# Patient Record
Sex: Female | Born: 1950 | Race: White | Hispanic: No | Marital: Single | Smoking: Current every day smoker
Health system: Southern US, Community
[De-identification: ages and names within clinical notes are randomized; demographics above are authoritative.]

## PROBLEM LIST (undated history)

## (undated) DIAGNOSIS — F17200 Nicotine dependence, unspecified, uncomplicated: Secondary | ICD-10-CM

## (undated) DIAGNOSIS — I4811 Longstanding persistent atrial fibrillation: Secondary | ICD-10-CM

## (undated) DIAGNOSIS — Z87898 Personal history of other specified conditions: Secondary | ICD-10-CM

## (undated) DIAGNOSIS — K219 Gastro-esophageal reflux disease without esophagitis: Secondary | ICD-10-CM

## (undated) DIAGNOSIS — R809 Proteinuria, unspecified: Secondary | ICD-10-CM

## (undated) DIAGNOSIS — E119 Type 2 diabetes mellitus without complications: Secondary | ICD-10-CM

## (undated) DIAGNOSIS — E785 Hyperlipidemia, unspecified: Secondary | ICD-10-CM

## (undated) DIAGNOSIS — K922 Gastrointestinal hemorrhage, unspecified: Secondary | ICD-10-CM

## (undated) DIAGNOSIS — E6609 Other obesity due to excess calories: Secondary | ICD-10-CM

## (undated) DIAGNOSIS — J449 Chronic obstructive pulmonary disease, unspecified: Secondary | ICD-10-CM

## (undated) DIAGNOSIS — I10 Essential (primary) hypertension: Secondary | ICD-10-CM

## (undated) DIAGNOSIS — D509 Iron deficiency anemia, unspecified: Secondary | ICD-10-CM

## (undated) DIAGNOSIS — D649 Anemia, unspecified: Secondary | ICD-10-CM

## (undated) HISTORY — DX: Iron deficiency anemia, unspecified: D50.9

## (undated) HISTORY — DX: Hyperlipidemia, unspecified: E78.5

## (undated) HISTORY — PX: OTHER SURGICAL HISTORY: SHX169

## (undated) HISTORY — DX: Proteinuria, unspecified: R80.9

## (undated) HISTORY — DX: Anemia, unspecified: D64.9

## (undated) HISTORY — DX: Gastro-esophageal reflux disease without esophagitis: K21.9

## (undated) HISTORY — DX: Nicotine dependence, unspecified, uncomplicated: F17.200

## (undated) HISTORY — DX: Other obesity due to excess calories: E66.09

## (undated) HISTORY — DX: Essential (primary) hypertension: I10

## (undated) HISTORY — DX: Personal history of other specified conditions: Z87.898

---
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
## 1998-06-14 ENCOUNTER — Ambulatory Visit (HOSPITAL_COMMUNITY): Admission: RE | Admit: 1998-06-14 | Discharge: 1998-06-14 | Payer: Self-pay | Admitting: Otolaryngology

## 1998-06-14 ENCOUNTER — Encounter: Payer: Self-pay | Admitting: Otolaryngology

## 1999-02-23 ENCOUNTER — Encounter: Payer: Self-pay | Admitting: Family Medicine

## 1999-02-23 ENCOUNTER — Encounter: Admission: RE | Admit: 1999-02-23 | Discharge: 1999-02-23 | Payer: Self-pay | Admitting: Family Medicine

## 1999-06-04 ENCOUNTER — Other Ambulatory Visit: Admission: RE | Admit: 1999-06-04 | Discharge: 1999-06-04 | Payer: Self-pay | Admitting: Family Medicine

## 2000-07-30 ENCOUNTER — Encounter: Payer: Self-pay | Admitting: Family Medicine

## 2000-07-30 ENCOUNTER — Encounter: Admission: RE | Admit: 2000-07-30 | Discharge: 2000-07-30 | Payer: Self-pay | Admitting: Family Medicine

## 2000-08-15 ENCOUNTER — Encounter: Payer: Self-pay | Admitting: Family Medicine

## 2000-08-15 ENCOUNTER — Encounter: Admission: RE | Admit: 2000-08-15 | Discharge: 2000-08-15 | Payer: Self-pay | Admitting: Family Medicine

## 2001-07-20 ENCOUNTER — Other Ambulatory Visit: Admission: RE | Admit: 2001-07-20 | Discharge: 2001-07-20 | Payer: Self-pay | Admitting: Family Medicine

## 2003-07-25 ENCOUNTER — Encounter: Admission: RE | Admit: 2003-07-25 | Discharge: 2003-07-25 | Payer: Self-pay | Admitting: Family Medicine

## 2003-08-10 ENCOUNTER — Other Ambulatory Visit: Admission: RE | Admit: 2003-08-10 | Discharge: 2003-08-10 | Payer: Self-pay | Admitting: Family Medicine

## 2004-09-20 ENCOUNTER — Encounter: Admission: RE | Admit: 2004-09-20 | Discharge: 2004-09-20 | Payer: Self-pay | Admitting: Family Medicine

## 2004-10-12 ENCOUNTER — Other Ambulatory Visit: Admission: RE | Admit: 2004-10-12 | Discharge: 2004-10-12 | Payer: Self-pay | Admitting: Family Medicine

## 2005-10-14 ENCOUNTER — Other Ambulatory Visit: Admission: RE | Admit: 2005-10-14 | Discharge: 2005-10-14 | Payer: Self-pay | Admitting: Family Medicine

## 2005-10-22 ENCOUNTER — Encounter: Admission: RE | Admit: 2005-10-22 | Discharge: 2005-10-22 | Payer: Self-pay | Admitting: Family Medicine

## 2006-11-11 ENCOUNTER — Other Ambulatory Visit: Admission: RE | Admit: 2006-11-11 | Discharge: 2006-11-11 | Payer: Self-pay | Admitting: Family Medicine

## 2006-11-25 ENCOUNTER — Encounter: Admission: RE | Admit: 2006-11-25 | Discharge: 2006-11-25 | Payer: Self-pay | Admitting: Family Medicine

## 2007-12-09 ENCOUNTER — Encounter: Admission: RE | Admit: 2007-12-09 | Discharge: 2007-12-09 | Payer: Self-pay | Admitting: Family Medicine

## 2007-12-17 ENCOUNTER — Other Ambulatory Visit: Admission: RE | Admit: 2007-12-17 | Discharge: 2007-12-17 | Payer: Self-pay | Admitting: Family Medicine

## 2008-12-12 ENCOUNTER — Encounter: Admission: RE | Admit: 2008-12-12 | Discharge: 2008-12-12 | Payer: Self-pay | Admitting: Family Medicine

## 2008-12-20 ENCOUNTER — Other Ambulatory Visit: Admission: RE | Admit: 2008-12-20 | Discharge: 2008-12-20 | Payer: Self-pay | Admitting: Family Medicine

## 2009-12-18 ENCOUNTER — Encounter: Admission: RE | Admit: 2009-12-18 | Discharge: 2009-12-18 | Payer: Self-pay | Admitting: Family Medicine

## 2010-12-24 ENCOUNTER — Other Ambulatory Visit: Payer: Self-pay | Admitting: Family Medicine

## 2010-12-24 DIAGNOSIS — Z1231 Encounter for screening mammogram for malignant neoplasm of breast: Secondary | ICD-10-CM

## 2010-12-25 ENCOUNTER — Other Ambulatory Visit: Payer: Self-pay | Admitting: Family Medicine

## 2010-12-25 ENCOUNTER — Other Ambulatory Visit (HOSPITAL_COMMUNITY)
Admission: RE | Admit: 2010-12-25 | Discharge: 2010-12-25 | Disposition: A | Discharge status: Home / Self Care | Payer: 59 | Source: Ambulatory Visit | Attending: Family Medicine | Admitting: Family Medicine

## 2010-12-25 DIAGNOSIS — Z124 Encounter for screening for malignant neoplasm of cervix: Secondary | ICD-10-CM | POA: Insufficient documentation

## 2010-12-25 DIAGNOSIS — Z1159 Encounter for screening for other viral diseases: Secondary | ICD-10-CM | POA: Insufficient documentation

## 2010-12-26 ENCOUNTER — Ambulatory Visit
Admission: RE | Admit: 2010-12-26 | Discharge: 2010-12-26 | Disposition: A | Discharge status: Home / Self Care | Payer: 59 | Source: Ambulatory Visit | Attending: Family Medicine | Admitting: Family Medicine

## 2010-12-26 DIAGNOSIS — Z1231 Encounter for screening mammogram for malignant neoplasm of breast: Secondary | ICD-10-CM

## 2011-12-25 ENCOUNTER — Other Ambulatory Visit: Payer: Self-pay | Admitting: Family Medicine

## 2011-12-25 DIAGNOSIS — Z1231 Encounter for screening mammogram for malignant neoplasm of breast: Secondary | ICD-10-CM

## 2012-01-13 ENCOUNTER — Ambulatory Visit: Payer: 59

## 2012-01-27 ENCOUNTER — Ambulatory Visit
Admission: RE | Admit: 2012-01-27 | Discharge: 2012-01-27 | Disposition: A | Discharge status: Home / Self Care | Payer: Commercial Indemnity | Source: Ambulatory Visit | Attending: Family Medicine | Admitting: Family Medicine

## 2012-01-27 ENCOUNTER — Ambulatory Visit: Payer: 59

## 2012-01-27 DIAGNOSIS — Z1231 Encounter for screening mammogram for malignant neoplasm of breast: Secondary | ICD-10-CM

## 2013-01-13 ENCOUNTER — Other Ambulatory Visit: Payer: Self-pay | Admitting: Family Medicine

## 2013-01-13 DIAGNOSIS — F17200 Nicotine dependence, unspecified, uncomplicated: Secondary | ICD-10-CM

## 2013-01-27 ENCOUNTER — Other Ambulatory Visit: Payer: Self-pay

## 2013-01-27 DIAGNOSIS — Z1231 Encounter for screening mammogram for malignant neoplasm of breast: Secondary | ICD-10-CM

## 2013-02-15 ENCOUNTER — Ambulatory Visit
Admission: RE | Admit: 2013-02-15 | Discharge: 2013-02-15 | Disposition: A | Discharge status: Home / Self Care | Payer: Commercial Indemnity | Source: Ambulatory Visit

## 2013-02-15 DIAGNOSIS — Z1231 Encounter for screening mammogram for malignant neoplasm of breast: Secondary | ICD-10-CM

## 2014-03-07 ENCOUNTER — Other Ambulatory Visit: Payer: Self-pay

## 2014-03-07 DIAGNOSIS — Z1231 Encounter for screening mammogram for malignant neoplasm of breast: Secondary | ICD-10-CM

## 2014-03-14 ENCOUNTER — Other Ambulatory Visit: Payer: Self-pay | Admitting: Gastroenterology

## 2014-03-17 ENCOUNTER — Ambulatory Visit
Admission: RE | Admit: 2014-03-17 | Discharge: 2014-03-17 | Disposition: A | Discharge status: Home / Self Care | Payer: Commercial Indemnity | Source: Ambulatory Visit

## 2014-03-17 DIAGNOSIS — Z1231 Encounter for screening mammogram for malignant neoplasm of breast: Secondary | ICD-10-CM

## 2015-03-16 ENCOUNTER — Other Ambulatory Visit: Payer: Self-pay

## 2015-03-16 DIAGNOSIS — Z1231 Encounter for screening mammogram for malignant neoplasm of breast: Secondary | ICD-10-CM

## 2015-04-06 ENCOUNTER — Ambulatory Visit: Payer: Commercial Indemnity

## 2015-04-27 ENCOUNTER — Ambulatory Visit: Payer: Commercial Indemnity

## 2015-05-29 ENCOUNTER — Ambulatory Visit
Admission: RE | Admit: 2015-05-29 | Discharge: 2015-05-29 | Disposition: A | Discharge status: Home / Self Care | Payer: 59 | Source: Ambulatory Visit

## 2015-05-29 DIAGNOSIS — Z1231 Encounter for screening mammogram for malignant neoplasm of breast: Secondary | ICD-10-CM

## 2015-05-31 HISTORY — PX: DIAGNOSTIC MAMMOGRAM: HXRAD719

## 2015-10-13 ENCOUNTER — Encounter: Payer: Self-pay | Admitting: Internal Medicine

## 2015-11-29 ENCOUNTER — Encounter: Payer: Self-pay | Admitting: Internal Medicine

## 2016-02-02 ENCOUNTER — Other Ambulatory Visit (HOSPITAL_COMMUNITY)
Admission: RE | Admit: 2016-02-02 | Discharge: 2016-02-02 | Disposition: A | Discharge status: Home / Self Care | Payer: 59 | Source: Ambulatory Visit | Attending: Family Medicine | Admitting: Family Medicine

## 2016-02-02 ENCOUNTER — Other Ambulatory Visit: Payer: Self-pay | Admitting: Family Medicine

## 2016-02-02 DIAGNOSIS — Z124 Encounter for screening for malignant neoplasm of cervix: Secondary | ICD-10-CM | POA: Diagnosis present

## 2016-02-02 DIAGNOSIS — Z1151 Encounter for screening for human papillomavirus (HPV): Secondary | ICD-10-CM | POA: Insufficient documentation

## 2016-02-05 LAB — CYTOLOGY - PAP

## 2016-03-11 ENCOUNTER — Ambulatory Visit (INDEPENDENT_AMBULATORY_CARE_PROVIDER_SITE_OTHER): Payer: 59 | Admitting: Cardiovascular Disease

## 2016-03-11 ENCOUNTER — Encounter: Payer: Self-pay | Admitting: Cardiovascular Disease

## 2016-03-11 ENCOUNTER — Encounter (INDEPENDENT_AMBULATORY_CARE_PROVIDER_SITE_OTHER): Payer: Self-pay

## 2016-03-11 VITALS — BP 188/64 | HR 66 | Ht 65.0 in | Wt 233.0 lb

## 2016-03-11 DIAGNOSIS — I10 Essential (primary) hypertension: Secondary | ICD-10-CM

## 2016-03-11 DIAGNOSIS — I517 Cardiomegaly: Secondary | ICD-10-CM | POA: Diagnosis not present

## 2016-03-11 DIAGNOSIS — I739 Peripheral vascular disease, unspecified: Secondary | ICD-10-CM | POA: Diagnosis not present

## 2016-03-11 MED ORDER — AMLODIPINE BESYLATE 5 MG PO TABS: 5.0000 mg | ORAL_TABLET | Freq: Every day | ORAL | 3 refills | Status: DC

## 2016-03-11 MED ORDER — AMLODIPINE BESYLATE 5 MG PO TABS
5.0000 mg | ORAL_TABLET | Freq: Every day | ORAL | 3 refills | Status: DC
Start: 2016-03-11 — End: 2016-03-11

## 2016-03-11 NOTE — Patient Instructions (Addendum)
Your physician has recommended you make the following change in your medication:  1.) START amlodipine 5 mg once daily for blood pressure  Your physician has requested that you have an echocardiogram. Echocardiography is a painless test that uses sound waves to create images of your heart. It provides your doctor with information about the size and shape of your heart and how well your heart's chambers and valves are working. This procedure takes approximately one hour. There are no restrictions for this procedure.  Your physician has requested that you have a lower extremity arterial duplex. This test is an ultrasound of the arteries in the legs. It looks at arterial blood flow in the legs. Allow one hour for Lower Arterial scans. There are no restrictions or special instructions.  Your physician has requested that you have an echocardiogram. Echocardiography is a painless test that uses sound waves to create images of your heart. It provides your doctor with information about the size and shape of your heart and how well your heart's chambers and valves are working. This procedure takes approximately one hour. There are no restrictions for this procedure.  Your physician recommends that you schedule a follow-up appointment in: 3-4 months with Dr. Acie Fredrickson.

## 2016-03-11 NOTE — Progress Notes (Signed)
Cardiology Office Note   Date:  03/11/2016   ID:  Paula Wheeler, DOB Jan 14, 1951, MRN [id number]  PCP:  Gennette Pac, MD  Cardiologist:   Mertie Moores, MD   Chief Complaint  Patient presents with  . Hypertension   Problem list 1. Essential HTN  2. Hyperlipidemia 3. Cardiomegaly on x-ray 4. Diabetes mellitus 5. Obesity  6.      Nov. 13, 2017: Paula Wheeler is a 65 y.o. female who presents for evaluation of cardiomegaly She requested a CXR to look for lung issues ( is a current smoker) was found have cardiac megaly.  Her main complaint is that her legs give out when she is walking. She does not get any regular exercise.  No CP.   Some shortness of breath with exertion  Has DM,   She is getting better control of her glucose levels recently .    She admits that she eats more salt that she probably should. She's canned soup was on occasions. She put salt on her apples.   Past Medical History:  Diagnosis Date  . Abnormal urine findings   . Abnormal vaginal Pap smear   . Allergic rhinitis   . Anemia   . Cardiomegaly   . Diabetes mellitus without complication ([place])   . Dyslipidemia   . Dyspnea   . Encounter for screening for malignant neoplasm of cervix   . Exogenous obesity   . GERD (gastroesophageal reflux disease)   . H/O: gout   . History of abnormal cells from cervix   . Hyperlipidemia   . Hypertension   . Iron deficiency anemia   . Microalbuminuria   . Peripheral edema   . Tobacco dependence   . Tobacco use   . Wax in ear     Past Surgical History:  Procedure Laterality Date  . APPENDECTOMY     AGE 10  . DIAGNOSTIC MAMMOGRAM  05/2015  . EXPLORATORY LAPAROTOMY    . PAP SMEAR  2012 AND 2017     Current Outpatient Prescriptions  Medication Sig Dispense Refill  . albuterol (PROVENTIL HFA;VENTOLIN HFA) 108 (90 Base) MCG/ACT inhaler Inhale 2 puffs into the lungs every 4 (four) hours as needed for wheezing or shortness of breath.      . allopurinol (ZYLOPRIM) 100 MG tablet Take 100 mg by mouth daily.    . Aspirin-Acetaminophen-Caffeine (GOODY HEADACHE PO) Take 1 Package by mouth daily as needed (for pain).    Marland Kitchen atorvastatin (LIPITOR) 80 MG tablet Take 80 mg by mouth daily.    Marland Kitchen esomeprazole (NEXIUM) 20 MG packet Take 20 mg by mouth daily before breakfast.    . HYDROcodone-acetaminophen (NORCO/VICODIN) 5-325 MG tablet Take 1 tablet by mouth every 6 (six) hours as needed for moderate pain.    . indomethacin (INDOCIN) 25 MG capsule 1-2 CAPSULES EVERY 6-8 HOURS WITH FOOD PRN FOR PAIN    . insulin NPH-regular Human (NOVOLIN 70/30) (70-30) 100 UNIT/ML injection Inject into the skin. INJECT 35 UNITS IN MORNINGS AND 60 UNITS IN EVENINGS    . Insulin Syringe-Needle U-100 (B-D INS SYR ULTRAFINE 1CC/31G) 31G X 5/16" 1 ML MISC by Does not apply route.    . loratadine (CLARITIN) 10 MG tablet Take 10 mg by mouth daily.    Marland Kitchen METFORMIN HCL PO 500 mg. TAKE 2 TABLETS BY MOUTH EVERY MORNING AND 3 TABLETS BY MOUTH EVERY EVENING    . QUINAPRIL HCL PO Take 40 mg by mouth 2 (two) times daily.    Marland Kitchen  TRIAMCINOLONE ACETONIDE EX Apply topically.    . triamterene-hydrochlorothiazide (MAXZIDE) 75-50 MG tablet Take 1 tablet by mouth daily.     No current facility-administered medications for this visit.     Allergies:   Nsaids and Penicillins    Social History:  The patient  reports that she has been smoking Cigarettes.  She has been smoking about 0.50 packs per day. She has never used smokeless tobacco. She reports that she drinks alcohol. She reports that she does not use drugs.   Family History:  The patient's family history includes Cancer in her mother; Colon polyps in her brother, brother, brother, brother, brother, and brother; Diabetes in her father; Heart attack in her father; Hypertension in her mother; Macular degeneration in her mother.    ROS:  Please see the history of present illness.    Review of Systems: Constitutional:  denies  fever, chills, diaphoresis, appetite change and fatigue.  HEENT: denies photophobia, eye pain, redness, hearing loss, ear pain, congestion, sore throat, rhinorrhea, sneezing, neck pain, neck stiffness and tinnitus.  Respiratory: denies SOB, DOE, cough, chest tightness, and wheezing.  Cardiovascular: denies chest pain, palpitations and leg swelling.  Gastrointestinal: denies nausea, vomiting, abdominal pain, diarrhea, constipation, blood in stool.  Genitourinary: denies dysuria, urgency, frequency, hematuria, flank pain and difficulty urinating.  Musculoskeletal: admits to   Leg pain and burning with walking   Skin: admits to  Red scaley rash on lower legs  Neurological: denies dizziness, seizures, syncope, weakness, light-headedness, numbness and headaches.   Hematological: denies adenopathy, easy bruising, personal or family bleeding history.  Psychiatric/ Behavioral: denies suicidal ideation, mood changes, confusion, nervousness, sleep disturbance and agitation.       All other systems are reviewed and negative.    PHYSICAL EXAM: VS:  BP (!) 188/64 (BP Location: Right Arm)   Pulse 66   Ht 5\' 5"  (1.651 m)   Wt 233 lb (105.7 kg)   SpO2 98%   BMI 38.77 kg/m  , BMI Body mass index is 38.77 kg/m. GEN:  Obese  HEENT: normal  Neck: no JVD, carotid bruits, or masses Cardiac: RRR; no murmurs, rubs, or gallops,no edema  Respiratory:  clear to auscultation bilaterally, normal work of breathing GI: soft, nontender, nondistended, + BS MS: trace - 1 + leg edema,  scaley rash on lower ext.  Poor distal leg pulses  Skin: warm and dry, no rash Neuro:  Strength and sensation are intact Psych: normal  EKG:  EKG is ordered today. The ekg ordered today demonstrates  Sinus brady at 53. NS sT abn.    Recent Labs: No results found for requested labs within last 8760 hours.    Lipid Panel No results found for: CHOL, TRIG, HDL, CHOLHDL, VLDL, LDLCALC, LDLDIRECT    Wt Readings from Last 3  Encounters:  03/11/16 233 lb (105.7 kg)      Other studies Reviewed: Additional studies/ records that were reviewed today include: . Review of the above records demonstrates:    ASSESSMENT AND PLAN:  1.  Cardiomegaly: We'll get an echocardiogram for further evaluation.  We've discussed restricting salt intake and weight loss.    2. Essential hypertension: We discussed weight loss and salt restricted. She also needs to quit smoking. We'll start amlodipine 5 mg a day. Continue meds.   3. Claudication:   She complains of having leg pain and an burning when she walks. I suspect that she has peripheral vascular disease from her smoking. We'll get a lower extremity arterial  duplex scan. Ive advised Smoking cessation and regular exercise. I've advised weight loss.  Will see her in 3-4 months for follow up visit  Current medicines are reviewed at length with the patient today.  The patient does not have concerns regarding medicines.  Labs/ tests ordered today include:  No orders of the defined types were placed in this encounter.   Disposition:   FU with me in 3-4 months     Mertie Moores, MD  03/11/2016 9:24 AM    [address], [place], [place]  [address] Phone: [phone]; Fax: [phone]

## 2016-03-13 ENCOUNTER — Encounter: Payer: Self-pay | Admitting: Cardiovascular Disease

## 2016-03-14 ENCOUNTER — Encounter: Payer: Self-pay | Admitting: Cardiovascular Disease

## 2016-03-25 ENCOUNTER — Telehealth: Payer: Self-pay | Admitting: Cardiovascular Disease

## 2016-03-25 NOTE — Telephone Encounter (Signed)
Patient wants to know if Dr. Acie Fredrickson would prescribe pain medication for her leg pain.  She has an LE ARTERIAL scheduled for 04/03/16

## 2016-03-25 NOTE — Telephone Encounter (Signed)
Im sorry to hear that she is having leg pain . I will not be able to give her pain meds She may need to call her medical doctor

## 2016-03-25 NOTE — Telephone Encounter (Signed)
Follow Up:    Pt calling again,she wants something for the pain. I told her we were waiting to hear from the doctor.

## 2016-03-25 NOTE — Telephone Encounter (Signed)
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  I reviewed her medications and asked if she takes lipitor 80 mg daily.  She states she has been cutting the medication in half and taking 40 mg on the advice of her pharmacist.  I advised that lipitor can cause leg pain and asked if she has correlated the 2 in the past; she denies. I advised per Dr. Acie Fredrickson, that she may hold lipitor for 2-4 weeks to see if pain improves. I advised that per Dr. Acie Fredrickson, he does not feel that her her leg pain is caused by the claudication but the lower extremity u/s will tell us more.  I advised she may take ibuprofen 400 mg tid as needed for pain. She verbalized understanding and agreement with plan and is aware of upcoming appointments for testing.  She thanked me for the call.

## 2016-03-26 ENCOUNTER — Other Ambulatory Visit: Payer: Self-pay | Admitting: Cardiovascular Disease

## 2016-03-26 DIAGNOSIS — I739 Peripheral vascular disease, unspecified: Secondary | ICD-10-CM

## 2016-04-01 ENCOUNTER — Ambulatory Visit (HOSPITAL_COMMUNITY): Payer: 59 | Attending: Cardiology

## 2016-04-01 ENCOUNTER — Other Ambulatory Visit: Payer: Self-pay

## 2016-04-01 DIAGNOSIS — E785 Hyperlipidemia, unspecified: Secondary | ICD-10-CM | POA: Insufficient documentation

## 2016-04-01 DIAGNOSIS — I340 Nonrheumatic mitral (valve) insufficiency: Secondary | ICD-10-CM | POA: Insufficient documentation

## 2016-04-01 DIAGNOSIS — E119 Type 2 diabetes mellitus without complications: Secondary | ICD-10-CM | POA: Diagnosis not present

## 2016-04-01 DIAGNOSIS — Z8249 Family history of ischemic heart disease and other diseases of the circulatory system: Secondary | ICD-10-CM | POA: Diagnosis not present

## 2016-04-01 DIAGNOSIS — I739 Peripheral vascular disease, unspecified: Secondary | ICD-10-CM | POA: Diagnosis not present

## 2016-04-01 DIAGNOSIS — I517 Cardiomegaly: Secondary | ICD-10-CM | POA: Diagnosis not present

## 2016-04-01 DIAGNOSIS — I10 Essential (primary) hypertension: Secondary | ICD-10-CM | POA: Diagnosis not present

## 2016-04-01 DIAGNOSIS — I119 Hypertensive heart disease without heart failure: Secondary | ICD-10-CM | POA: Diagnosis not present

## 2016-04-01 DIAGNOSIS — Z720 Tobacco use: Secondary | ICD-10-CM | POA: Insufficient documentation

## 2016-04-03 ENCOUNTER — Ambulatory Visit (HOSPITAL_COMMUNITY)
Admission: RE | Admit: 2016-04-03 | Discharge: 2016-04-03 | Disposition: A | Discharge status: Home / Self Care | Payer: 59 | Source: Ambulatory Visit | Attending: Cardiovascular Disease | Admitting: Cardiovascular Disease

## 2016-04-03 DIAGNOSIS — F17200 Nicotine dependence, unspecified, uncomplicated: Secondary | ICD-10-CM | POA: Insufficient documentation

## 2016-04-03 DIAGNOSIS — E785 Hyperlipidemia, unspecified: Secondary | ICD-10-CM | POA: Insufficient documentation

## 2016-04-03 DIAGNOSIS — E119 Type 2 diabetes mellitus without complications: Secondary | ICD-10-CM | POA: Insufficient documentation

## 2016-04-03 DIAGNOSIS — I10 Essential (primary) hypertension: Secondary | ICD-10-CM | POA: Insufficient documentation

## 2016-04-03 DIAGNOSIS — I739 Peripheral vascular disease, unspecified: Secondary | ICD-10-CM | POA: Diagnosis not present

## 2016-04-05 ENCOUNTER — Telehealth: Payer: Self-pay | Admitting: Cardiovascular Disease

## 2016-04-05 NOTE — Telephone Encounter (Signed)
Pt had doppler done Wednesday, and would like the results pls call  [phone] or [phone]

## 2016-04-05 NOTE — Telephone Encounter (Signed)
Patient is calling for results from her lower extremity ultrasounds done on 04/03/16. She states that she is still having pain in her lower extremities and it is difficult to walk. I let the patient know that we do not have those results on file and as soon as we do we will give her a call back. Patient verbalized understanding.

## 2016-04-05 NOTE — Telephone Encounter (Signed)
She has significant PVD in both legs Please set her up to see Dr. Fletcher Anon for [facility] consult

## 2016-04-09 ENCOUNTER — Encounter: Payer: Self-pay | Admitting: Cardiovascular Disease

## 2016-04-09 ENCOUNTER — Ambulatory Visit (INDEPENDENT_AMBULATORY_CARE_PROVIDER_SITE_OTHER): Payer: 59 | Admitting: Cardiovascular Disease

## 2016-04-09 VITALS — BP 134/68 | HR 74 | Ht 65.0 in | Wt 221.6 lb

## 2016-04-09 DIAGNOSIS — E784 Other hyperlipidemia: Secondary | ICD-10-CM | POA: Diagnosis not present

## 2016-04-09 DIAGNOSIS — I4891 Unspecified atrial fibrillation: Secondary | ICD-10-CM

## 2016-04-09 DIAGNOSIS — Z720 Tobacco use: Secondary | ICD-10-CM | POA: Diagnosis not present

## 2016-04-09 DIAGNOSIS — I739 Peripheral vascular disease, unspecified: Secondary | ICD-10-CM

## 2016-04-09 DIAGNOSIS — E7849 Other hyperlipidemia: Secondary | ICD-10-CM

## 2016-04-09 MED ORDER — APIXABAN 5 MG PO TABS: 5.0000 mg | ORAL_TABLET | Freq: Two times a day (BID) | ORAL | 8 refills | Status: DC

## 2016-04-09 NOTE — Progress Notes (Signed)
Cardiology Office Note   Date:  04/09/2016   ID:  Paula Wheeler, DOB Jun 27, 1950, MRN [id number]  PCP:  Gennette Pac, MD  Cardiologist:  Dr. Acie Fredrickson  Chief Complaint  Patient presents with  . New Patient (Initial Visit)    Eval for PVD      History of Present Illness: Paula Wheeler is a 65 y.o. female who was referred by Dr. Acie Fredrickson for evaluation of Peripheral arterial disease. She was referred recently to Dr.Nahser for evaluation of cardiomegaly. She had an echocardiogram done which showed normal LV systolic function with severe left ventricular hypertrophy and grade 2 diastolic dysfunction. No significant valvular abnormalities. Left atrium was mildly dilated. She did complain of bilateral calf discomfort with walking and thus referred. Disease was suspected. She underwent noninvasive vascular studies which showed mildly reduced ABI bilaterally with evidence of SFA disease on both sides. She has chronic medical conditions that include diabetes mellitus, hypertension, hyperlipidemia and tobacco use. She has been smoking for 30 years. She also has family history of coronary artery disease. Her father died at the age of 56 of myocardial infarction. She complains of bilateral leg pain worse on the right side described as aching in the muscles but she also complains of knee joint problems and both sides. The pain typically happens after walking about one block and she has to rest for few minutes before she can resume. She is also limited by exertional dyspnea without chest discomfort. Atorvastatin was held to see if it was causing some myalgia. However, the symptoms did not change. Her heart rate was noted to be irregular today. EKG was performed which confirmed atrial fibrillation. She has no prior history of this. She has not noticed change in her symptoms since last visit.    Past Medical History:  Diagnosis Date  . Abnormal urine findings   . Abnormal vaginal Pap smear     . Allergic rhinitis   . Anemia   . Cardiomegaly   . Diabetes mellitus without complication ([place])   . Dyslipidemia   . Dyspnea   . Encounter for screening for malignant neoplasm of cervix   . Exogenous obesity   . GERD (gastroesophageal reflux disease)   . H/O: gout   . History of abnormal cells from cervix   . Hyperlipidemia   . Hypertension   . Iron deficiency anemia   . Microalbuminuria   . Peripheral edema   . Tobacco dependence   . Tobacco use   . Wax in ear     Past Surgical History:  Procedure Laterality Date  . APPENDECTOMY     AGE 78  . DIAGNOSTIC MAMMOGRAM  05/2015  . EXPLORATORY LAPAROTOMY    . PAP SMEAR  2012 AND 2017     Current Outpatient Prescriptions  Medication Sig Dispense Refill  . albuterol (PROVENTIL HFA;VENTOLIN HFA) 108 (90 Base) MCG/ACT inhaler Inhale 2 puffs into the lungs every 4 (four) hours as needed for wheezing or shortness of breath.    . allopurinol (ZYLOPRIM) 100 MG tablet Take 100 mg by mouth daily.    Marland Kitchen amLODipine (NORVASC) 5 MG tablet Take 1 tablet (5 mg total) by mouth daily. 90 tablet 3  . Aspirin-Acetaminophen-Caffeine (GOODY HEADACHE PO) Take 1 Package by mouth daily as needed (for pain).    Marland Kitchen esomeprazole (NEXIUM) 20 MG packet Take 20 mg by mouth daily before breakfast.    . HYDROcodone-acetaminophen (NORCO/VICODIN) 5-325 MG tablet Take 1 tablet by mouth every 6 (six)  hours as needed for moderate pain.    . indomethacin (INDOCIN) 25 MG capsule 1-2 CAPSULES EVERY 6-8 HOURS WITH FOOD PRN FOR PAIN    . insulin NPH-regular Human (NOVOLIN 70/30) (70-30) 100 UNIT/ML injection Inject into the skin. INJECT 35 UNITS IN MORNINGS AND 60 UNITS IN EVENINGS    . Insulin Syringe-Needle U-100 (B-D INS SYR ULTRAFINE 1CC/31G) 31G X 5/16" 1 ML MISC by Does not apply route.    . loratadine (CLARITIN) 10 MG tablet Take 10 mg by mouth daily.    . metFORMIN (GLUCOPHAGE) 500 MG tablet TAKE 2 TABLETS BY MOUTH EVERY MORNING AND 3 TABLETS BY MOUTH EVERY  EVENING    . QUINAPRIL HCL PO Take 40 mg by mouth 2 (two) times daily.    . TRIAMCINOLONE ACETONIDE EX Apply topically.    . triamterene-hydrochlorothiazide (MAXZIDE) 75-50 MG tablet Take 1 tablet by mouth daily.     No current facility-administered medications for this visit.     Allergies:   Nsaids and Penicillins    Social History:  The patient  reports that she has been smoking Cigarettes.  She has been smoking about 0.50 packs per day. She has never used smokeless tobacco. She reports that she drinks alcohol. She reports that she does not use drugs.   Family History:  The patient's family history includes Cancer in her mother; Colon polyps in her brother, brother, brother, brother, brother, and brother; Diabetes in her father; Heart attack in her father; Hypertension in her mother; Macular degeneration in her mother.    ROS:  Please see the history of present illness.   Otherwise, review of systems are positive for none.   All other systems are reviewed and negative.    PHYSICAL EXAM: VS:  BP 134/68   Pulse 74   Ht 5\' 5"  (1.651 m)   Wt 221 lb 9.6 oz (100.5 kg)   BMI 36.88 kg/m  , BMI Body mass index is 36.88 kg/m. GEN: Well nourished, well developed, in no acute distress  HEENT: normal  Neck: no JVD, carotid bruits, or masses Cardiac: Irregularly irregular; no rubs, or gallops, trace edema . One out of 6 systolic ejection murmur in the aortic area Respiratory:  clear to auscultation bilaterally, normal work of breathing GI: soft, nontender, nondistended, + BS MS: no deformity or atrophy  Skin: warm and dry, no rash Neuro:  Strength and sensation are intact Psych: euthymic mood, full affect   EKG:  EKG is ordered today. The ekg ordered today demonstrates atrial fibrillation with ventricular rate of 81 bpm.   Recent Labs: No results found for requested labs within last 8760 hours.    Lipid Panel No results found for: CHOL, TRIG, HDL, CHOLHDL, VLDL, LDLCALC,  LDLDIRECT    Wt Readings from Last 3 Encounters:  04/09/16 221 lb 9.6 oz (100.5 kg)  03/11/16 233 lb (105.7 kg)       PAD Screen 03/11/2016  Previous PAD dx? No  Previous surgical procedure? No  Pain with walking? Yes  Subsides with rest? Yes  Feet/toe relief with dangling? No  Painful, non-healing ulcers? No  Extremities discolored? No      ASSESSMENT AND PLAN:  1.  Peripheral arterial disease: The patient has bilateral leg claudication worse on the right side but she also has knee joint pain and she is limited by shortness of breath. Her claudication seems to be moderate to severe. I discussed with her the natural history and management of peripheral arterial disease. We  discussed the importance of controlling her risk factors especially smoking cessation. I asked her to start a walking exercise program and we will reevaluate her symptoms in few months. Angiography and endovascular intervention can be considered for refractory symptoms.  2. Atrial fibrillation: Newly diagnosed: She does not seem to be symptomatic from this. Ventricular rate is controlled without any medications. CHADS VASc2 score is 4. Thus, I recommend long-term anticoagulation. I reviewed her labs from October which showed normal renal function. She was mildly anemic with a hemoglobin of 10.1 but there is no active bleeding. I elected to start her on Eliquis 5 mg twice daily and discontinue Goody powder to minimize the risk of bleeding. Check labs in 1 month. She does not seem to be symptomatic from atrial fibrillation. Thus, I don't think she needs cardioversion. She has a follow-up appointment with Dr Acie Fredrickson in February.   3. Hyperlipidemia: Holding atorvastatin did not help with her leg pain which seems to be due to peripheral arterial disease. Thus, I asked her to resume this.  4. Tobacco use: I had a prolonged discussion with her about the importance of smoking cessation. She is going to try and nicotine gum.  She does not want Chantix at the present time.     Disposition:   FU with me in 6 months  Signed,  Kathlyn Sacramento, MD  04/09/2016 9:04 AM    [place]

## 2016-04-09 NOTE — Patient Instructions (Addendum)
Medication Instructions:  Your physician has recommended you make the following change in your medication:  1. START Eliquis 5mg  take one tablet by mouth twice a day 2. STOP Goody powders 3. RESUME Atorvastatin  Labwork: Your physician recommends that you return for lab work in: 1 MONTH--05/13/16 (BMP and CBC, orders given to the pt)  Testing/Procedures: No new orders.   Follow-Up: Your physician wants you to follow-up in: 6 MONTHS with Dr Fletcher Anon.  You will receive a reminder letter in the mail two months in advance. If you don't receive a letter, please call our office to schedule the follow-up appointment.   Any Other Special Instructions Will Be Listed Below (If Applicable).  If you need a refill on your cardiac medications before your next appointment, please call your pharmacy.   Steps to Quit Smoking Smoking tobacco can be bad for your health. It can also affect almost every organ in your body. Smoking puts you and people around you at risk for many serious long-lasting (chronic) diseases. Quitting smoking is hard, but it is one of the best things that you can do for your health. It is never too late to quit. What are the benefits of quitting smoking? When you quit smoking, you lower your risk for getting serious diseases and conditions. They can include:  Lung cancer or lung disease.  Heart disease.  Stroke.  Heart attack.  Not being able to have children (infertility).  Weak bones (osteoporosis) and broken bones (fractures). If you have coughing, wheezing, and shortness of breath, those symptoms may get better when you quit. You may also get sick less often. If you are pregnant, quitting smoking can help to lower your chances of having a baby of low birth weight. What can I do to help me quit smoking? Talk with your doctor about what can help you quit smoking. Some things you can do (strategies) include:  Quitting smoking totally, instead of slowly cutting back how much  you smoke over a period of time.  Going to in-person counseling. You are more likely to quit if you go to many counseling sessions.  Using resources and support systems, such as:  Online chats with a Social worker.  Phone quitlines.  Printed Furniture conservator/restorer.  Support groups or group counseling.  Text messaging programs.  Mobile phone apps or applications.  Taking medicines. Some of these medicines may have nicotine in them. If you are pregnant or breastfeeding, do not take any medicines to quit smoking unless your doctor says it is okay. Talk with your doctor about counseling or other things that can help you. Talk with your doctor about using more than one strategy at the same time, such as taking medicines while you are also going to in-person counseling. This can help make quitting easier. What things can I do to make it easier to quit? Quitting smoking might feel very hard at first, but there is a lot that you can do to make it easier. Take these steps:  Talk to your family and friends. Ask them to support and encourage you.  Call phone quitlines, reach out to support groups, or work with a Social worker.  Ask people who smoke to not smoke around you.  Avoid places that make you want (trigger) to smoke, such as:  Bars.  Parties.  Smoke-break areas at work.  Spend time with people who do not smoke.  Lower the stress in your life. Stress can make you want to smoke. Try these things to help  your stress:  Getting regular exercise.  Deep-breathing exercises.  Yoga.  Meditating.  Doing a body scan. To do this, close your eyes, focus on one area of your body at a time from head to toe, and notice which parts of your body are tense. Try to relax the muscles in those areas.  Download or buy apps on your mobile phone or tablet that can help you stick to your quit plan. There are many free apps, such as QuitGuide from the [employer] Office manager for Disease Control and Prevention). You  can find more support from smokefree.gov and other websites. This information is not intended to replace advice given to you by your health care provider. Make sure you discuss any questions you have with your health care provider. Document Released: 02/09/2009 Document Revised: 12/12/2015 Document Reviewed: 08/30/2014 Elsevier Interactive Patient Education  2017 [employer].

## 2016-05-13 LAB — CBC
HEMATOCRIT: 19 % — AB (ref 35.0–45.0)
Hemoglobin: 5.7 g/dL — CL (ref 11.7–15.5)
MCH: 24.7 pg — ABNORMAL LOW (ref 27.0–33.0)
MCHC: 30 g/dL — ABNORMAL LOW (ref 32.0–36.0)
MCV: 82.3 fL (ref 80.0–100.0)
MPV: 10 fL (ref 7.5–12.5)
Platelets: 495 10*3/uL — ABNORMAL HIGH (ref 140–400)
RBC: 2.31 MIL/uL — ABNORMAL LOW (ref 3.80–5.10)
RDW: 21.1 % — AB (ref 11.0–15.0)
WBC: 15.6 10*3/uL — ABNORMAL HIGH (ref 3.8–10.8)

## 2016-05-14 ENCOUNTER — Emergency Department (HOSPITAL_COMMUNITY): Payer: 59

## 2016-05-14 ENCOUNTER — Inpatient Hospital Stay (HOSPITAL_COMMUNITY)
Admission: EM | Admit: 2016-05-14 | Discharge: 2016-05-16 | DRG: 378 | Disposition: A | Discharge status: Home / Self Care | Payer: 59 | Attending: Internal Medicine | Admitting: Internal Medicine

## 2016-05-14 ENCOUNTER — Telehealth: Payer: Self-pay | Admitting: Cardiovascular Disease

## 2016-05-14 ENCOUNTER — Encounter (HOSPITAL_COMMUNITY): Payer: Self-pay | Admitting: Emergency Medicine

## 2016-05-14 DIAGNOSIS — Z7901 Long term (current) use of anticoagulants: Secondary | ICD-10-CM

## 2016-05-14 DIAGNOSIS — I119 Hypertensive heart disease without heart failure: Secondary | ICD-10-CM | POA: Diagnosis present

## 2016-05-14 DIAGNOSIS — N289 Disorder of kidney and ureter, unspecified: Secondary | ICD-10-CM

## 2016-05-14 DIAGNOSIS — E118 Type 2 diabetes mellitus with unspecified complications: Secondary | ICD-10-CM

## 2016-05-14 DIAGNOSIS — D649 Anemia, unspecified: Secondary | ICD-10-CM

## 2016-05-14 DIAGNOSIS — K254 Chronic or unspecified gastric ulcer with hemorrhage: Secondary | ICD-10-CM | POA: Diagnosis not present

## 2016-05-14 DIAGNOSIS — M109 Gout, unspecified: Secondary | ICD-10-CM | POA: Diagnosis present

## 2016-05-14 DIAGNOSIS — D62 Acute posthemorrhagic anemia: Secondary | ICD-10-CM | POA: Diagnosis present

## 2016-05-14 DIAGNOSIS — T45515A Adverse effect of anticoagulants, initial encounter: Secondary | ICD-10-CM | POA: Diagnosis present

## 2016-05-14 DIAGNOSIS — Z8249 Family history of ischemic heart disease and other diseases of the circulatory system: Secondary | ICD-10-CM

## 2016-05-14 DIAGNOSIS — D509 Iron deficiency anemia, unspecified: Secondary | ICD-10-CM | POA: Diagnosis present

## 2016-05-14 DIAGNOSIS — I482 Chronic atrial fibrillation, unspecified: Secondary | ICD-10-CM

## 2016-05-14 DIAGNOSIS — N179 Acute kidney failure, unspecified: Secondary | ICD-10-CM | POA: Diagnosis present

## 2016-05-14 DIAGNOSIS — E1151 Type 2 diabetes mellitus with diabetic peripheral angiopathy without gangrene: Secondary | ICD-10-CM | POA: Diagnosis present

## 2016-05-14 DIAGNOSIS — Z87891 Personal history of nicotine dependence: Secondary | ICD-10-CM

## 2016-05-14 DIAGNOSIS — K922 Gastrointestinal hemorrhage, unspecified: Secondary | ICD-10-CM | POA: Diagnosis present

## 2016-05-14 DIAGNOSIS — I10 Essential (primary) hypertension: Secondary | ICD-10-CM

## 2016-05-14 DIAGNOSIS — Z833 Family history of diabetes mellitus: Secondary | ICD-10-CM

## 2016-05-14 DIAGNOSIS — I481 Persistent atrial fibrillation: Secondary | ICD-10-CM | POA: Diagnosis present

## 2016-05-14 DIAGNOSIS — Z803 Family history of malignant neoplasm of breast: Secondary | ICD-10-CM

## 2016-05-14 DIAGNOSIS — Y92009 Unspecified place in unspecified non-institutional (private) residence as the place of occurrence of the external cause: Secondary | ICD-10-CM

## 2016-05-14 DIAGNOSIS — Z8371 Family history of colonic polyps: Secondary | ICD-10-CM

## 2016-05-14 DIAGNOSIS — K219 Gastro-esophageal reflux disease without esophagitis: Secondary | ICD-10-CM | POA: Diagnosis present

## 2016-05-14 DIAGNOSIS — E785 Hyperlipidemia, unspecified: Secondary | ICD-10-CM

## 2016-05-14 DIAGNOSIS — E875 Hyperkalemia: Secondary | ICD-10-CM | POA: Diagnosis present

## 2016-05-14 HISTORY — DX: Type 2 diabetes mellitus without complications: E11.9

## 2016-05-14 HISTORY — DX: Gastrointestinal hemorrhage, unspecified: K92.2

## 2016-05-14 HISTORY — DX: Chronic obstructive pulmonary disease, unspecified: J44.9

## 2016-05-14 LAB — RETICULOCYTES
RBC.: 2 MIL/uL — AB (ref 3.87–5.11)
Retic Count, Absolute: 62 10*3/uL (ref 19.0–186.0)
Retic Ct Pct: 3.1 % (ref 0.4–3.1)

## 2016-05-14 LAB — FERRITIN: Ferritin: 3 ng/mL — ABNORMAL LOW (ref 11–307)

## 2016-05-14 LAB — BASIC METABOLIC PANEL
Anion gap: 10 (ref 5–15)
BUN: 20 mg/dL (ref 7–25)
BUN: 17 mg/dL (ref 6–20)
CALCIUM: 8.8 mg/dL (ref 8.6–10.4)
CHLORIDE: 108 mmol/L (ref 101–111)
CO2: 18 mmol/L — AB (ref 20–31)
CO2: 18 mmol/L — ABNORMAL LOW (ref 22–32)
CREATININE: 1.38 mg/dL — AB (ref 0.44–1.00)
Calcium: 8.8 mg/dL — ABNORMAL LOW (ref 8.9–10.3)
Chloride: 107 mmol/L (ref 98–110)
Creat: 1.41 mg/dL — ABNORMAL HIGH (ref 0.50–0.99)
GFR calc Af Amer: 45 mL/min — ABNORMAL LOW (ref >60)
GFR calc non Af Amer: 39 mL/min — ABNORMAL LOW (ref >60)
GLUCOSE: 136 mg/dL — AB (ref 65–99)
Glucose, Bld: 177 mg/dL — ABNORMAL HIGH (ref 65–99)
Potassium: 6.5 mmol/L (ref 3.5–5.3)
Potassium: 5.6 mmol/L — ABNORMAL HIGH (ref 3.5–5.1)
SODIUM: 134 mmol/L — AB (ref 135–146)
Sodium: 136 mmol/L (ref 135–145)

## 2016-05-14 LAB — CBC
HEMATOCRIT: 15.7 % — AB (ref 36.0–46.0)
HEMOGLOBIN: 4.9 g/dL — AB (ref 12.0–15.0)
MCH: 25.4 pg — ABNORMAL LOW (ref 26.0–34.0)
MCHC: 31.2 g/dL (ref 30.0–36.0)
MCV: 81.3 fL (ref 78.0–100.0)
Platelets: 414 10*3/uL — ABNORMAL HIGH (ref 150–400)
RBC: 1.93 MIL/uL — ABNORMAL LOW (ref 3.87–5.11)
RDW: 20.4 % — ABNORMAL HIGH (ref 11.5–15.5)
WBC: 13.7 10*3/uL — ABNORMAL HIGH (ref 4.0–10.5)

## 2016-05-14 LAB — IRON AND TIBC
Iron: 5 ug/dL — ABNORMAL LOW (ref 28–170)
SATURATION RATIOS: 1 % — AB (ref 10.4–31.8)
TIBC: 510 ug/dL — AB (ref 250–450)
UIBC: 505 ug/dL

## 2016-05-14 LAB — PREPARE RBC (CROSSMATCH)

## 2016-05-14 LAB — FOLATE: FOLATE: 9.4 ng/mL (ref >5.9)

## 2016-05-14 LAB — POC OCCULT BLOOD, ED: FECAL OCCULT BLD: POSITIVE — AB

## 2016-05-14 LAB — HEMOGLOBIN AND HEMATOCRIT, BLOOD
HEMATOCRIT: 21.1 % — AB (ref 36.0–46.0)
HEMOGLOBIN: 6.7 g/dL — AB (ref 12.0–15.0)

## 2016-05-14 LAB — ABO/RH: ABO/RH(D): O POS

## 2016-05-14 LAB — GLUCOSE, CAPILLARY
GLUCOSE-CAPILLARY: 162 mg/dL — AB (ref 65–99)
Glucose-Capillary: 119 mg/dL — ABNORMAL HIGH (ref 65–99)

## 2016-05-14 LAB — VITAMIN B12: VITAMIN B 12: 96 pg/mL — AB (ref 180–914)

## 2016-05-14 MED ORDER — TRIAMTERENE-HCTZ 75-50 MG PO TABS
1.0000 | ORAL_TABLET | Freq: Every day | ORAL | Status: DC
  Filled 2016-05-14: qty 1

## 2016-05-14 MED ORDER — ONDANSETRON HCL 4 MG/2ML IJ SOLN: 4.0000 mg | Freq: Four times a day (QID) | INTRAMUSCULAR | Status: DC | PRN

## 2016-05-14 MED ORDER — SODIUM CHLORIDE 0.9 % IV SOLN
10.0000 mL/h | Freq: Once | INTRAVENOUS | Status: AC
  Administered 2016-05-14: 10 mL/h via INTRAVENOUS

## 2016-05-14 MED ORDER — AMLODIPINE BESYLATE 5 MG PO TABS: 5.0000 mg | ORAL_TABLET | Freq: Every day | ORAL | Status: DC

## 2016-05-14 MED ORDER — SODIUM CHLORIDE 0.9 % IV SOLN
INTRAVENOUS | Status: DC
  Administered 2016-05-14 (×2): via INTRAVENOUS

## 2016-05-14 MED ORDER — PANTOPRAZOLE SODIUM 40 MG IV SOLR
40.0000 mg | Freq: Two times a day (BID) | INTRAVENOUS | Status: DC
  Filled 2016-05-14: qty 40

## 2016-05-14 MED ORDER — SODIUM CHLORIDE 0.9% FLUSH
3.0000 mL | Freq: Two times a day (BID) | INTRAVENOUS | Status: DC
  Administered 2016-05-14 – 2016-05-15 (×2): 3 mL via INTRAVENOUS

## 2016-05-14 MED ORDER — DILTIAZEM HCL 25 MG/5ML IV SOLN
10.0000 mg | INTRAVENOUS | Status: DC | PRN
  Filled 2016-05-14: qty 5

## 2016-05-14 MED ORDER — INSULIN ASPART 100 UNIT/ML ~~LOC~~ SOLN: 0.0000 [IU] | Freq: Every day | SUBCUTANEOUS | Status: DC

## 2016-05-14 MED ORDER — ALLOPURINOL 100 MG PO TABS
100.0000 mg | ORAL_TABLET | Freq: Every day | ORAL | Status: DC
  Administered 2016-05-15 – 2016-05-16 (×2): 100 mg via ORAL
  Filled 2016-05-14 (×2): qty 1

## 2016-05-14 MED ORDER — SODIUM CHLORIDE 0.9 % IV BOLUS (SEPSIS)
1000.0000 mL | Freq: Once | INTRAVENOUS | Status: AC
  Administered 2016-05-14: 1000 mL via INTRAVENOUS

## 2016-05-14 MED ORDER — SODIUM CHLORIDE 0.9 % IV SOLN
Freq: Once | INTRAVENOUS | Status: AC
  Administered 2016-05-14: 23:00:00 via INTRAVENOUS

## 2016-05-14 MED ORDER — DICLOFENAC SODIUM 1 % TD GEL: 4.0000 g | Freq: Four times a day (QID) | TRANSDERMAL | Status: DC | PRN

## 2016-05-14 MED ORDER — ATORVASTATIN CALCIUM 40 MG PO TABS
40.0000 mg | ORAL_TABLET | Freq: Every day | ORAL | Status: DC
  Administered 2016-05-15 – 2016-05-16 (×2): 40 mg via ORAL
  Filled 2016-05-14 (×2): qty 1

## 2016-05-14 MED ORDER — SODIUM CHLORIDE 0.9 % IV SOLN
80.0000 mg | Freq: Once | INTRAVENOUS | Status: AC
  Administered 2016-05-14: 80 mg via INTRAVENOUS
  Filled 2016-05-14: qty 80

## 2016-05-14 MED ORDER — SODIUM CHLORIDE 0.9 % IV SOLN
8.0000 mg/h | INTRAVENOUS | Status: DC
  Administered 2016-05-14 – 2016-05-16 (×4): 8 mg/h via INTRAVENOUS
  Filled 2016-05-14 (×8): qty 80

## 2016-05-14 MED ORDER — HYDRALAZINE HCL 20 MG/ML IJ SOLN: 5.0000 mg | INTRAMUSCULAR | Status: DC | PRN

## 2016-05-14 MED ORDER — INSULIN ASPART 100 UNIT/ML ~~LOC~~ SOLN
0.0000 [IU] | Freq: Three times a day (TID) | SUBCUTANEOUS | Status: DC
  Administered 2016-05-15: 2 [IU] via SUBCUTANEOUS
  Administered 2016-05-15: 3 [IU] via SUBCUTANEOUS
  Administered 2016-05-15: 2 [IU] via SUBCUTANEOUS
  Administered 2016-05-16: 1 [IU] via SUBCUTANEOUS

## 2016-05-14 MED ORDER — HYDROCODONE-ACETAMINOPHEN 5-325 MG PO TABS: 1.0000 | ORAL_TABLET | Freq: Four times a day (QID) | ORAL | Status: DC | PRN

## 2016-05-14 MED ORDER — ONDANSETRON HCL 4 MG PO TABS: 4.0000 mg | ORAL_TABLET | Freq: Four times a day (QID) | ORAL | Status: DC | PRN

## 2016-05-14 NOTE — Progress Notes (Signed)
Attempted report x 1. Asked to call back in 30 min.

## 2016-05-14 NOTE — Telephone Encounter (Signed)
Received a call from [facility] in the lab regarding recent labs Patient has a critical Hgb 5.7 and K+ of 6.5  This was discussed with Dr Fletcher Anon and recommended that she go to ED  Spoke with patient who confirms black, tarry stools for 2 weeks and shortness of breath. Gave her Dr Tyrell Antonio recommendations however she is reluctant to going to ED. Advised patient that the lab values are critical and given her black stools, possible a bleed, and elevated K+ ED best choice for. Stated she would have to call back

## 2016-05-14 NOTE — Telephone Encounter (Signed)
Please call,said she had talked to you earlier this morning.

## 2016-05-14 NOTE — Progress Notes (Signed)
Pt hemoglobin 6.7. Baltazar Najjar, NP notified.

## 2016-05-14 NOTE — ED Triage Notes (Signed)
Pt presents with two days shortness of breath, anxiety noted.  Was contacted by MD told to come to ED right away due to abnormal labs; reports low hgb and high K+

## 2016-05-14 NOTE — Telephone Encounter (Signed)
Just wanted to let us know currently in ED

## 2016-05-14 NOTE — Telephone Encounter (Signed)
New message      Calling to give a critical lab result

## 2016-05-14 NOTE — ED Notes (Signed)
Got patient undressed into a gown and on the monitor did ekg shown to Dr linker

## 2016-05-14 NOTE — ED Provider Notes (Signed)
[facility] DEPT Provider Note   CSN: [id number] Arrival date & time: 05/14/16  1004     History   Chief Complaint Chief Complaint  Patient presents with  . Shortness of Breath    HPI Paula Wheeler is a 66 y.o. female.  HPI  Pt presents due to abnormal labs.  She states she was started on eliquis for afib 1 month ago.  She was seen by cardiology for followup yesterday- she was notified this morning that her hemoglobin was low and potassium was high. She states she has been somewhat more short of breath than usual.  No weakness.  No fainting.  She does note that she has been having dark stools.  No blood in urine.  There are no other associated systemic symptoms, there are no other alleviating or modifying factors.   Past Medical History:  Diagnosis Date  . Abnormal urine findings   . Abnormal vaginal Pap smear   . Allergic rhinitis   . Anemia   . Cardiomegaly   . COPD (chronic obstructive pulmonary disease) ([place])   . Dyslipidemia   . Dyspnea   . Encounter for screening for malignant neoplasm of cervix   . Exogenous obesity   . GERD (gastroesophageal reflux disease)   . GI bleed 05/14/2016  . H/O: gout   . History of abnormal cells from cervix   . History of blood transfusion 05/14/2016   "got my 1st today" (05/14/2016)  . Hyperlipidemia   . Hypertension   . Iron deficiency anemia   . Microalbuminuria   . Peripheral edema   . Symptomatic anemia 05/14/2016  . Tobacco dependence   . Tobacco use   . Type II diabetes mellitus ([place])   . Wax in ear     Patient Active Problem List   Diagnosis Date Noted  . GI bleed 05/14/2016  . Symptomatic anemia 05/14/2016  . Chronic atrial fibrillation ([place]) 05/14/2016  . Diabetes mellitus with complication ([place]) 40/98/1191  . Essential hypertension 05/14/2016  . HLD (hyperlipidemia) 05/14/2016  . Anemia   . Renal insufficiency     Past Surgical History:  Procedure Laterality Date  . DIAGNOSTIC MAMMOGRAM  05/2015  .  ESOPHAGOGASTRODUODENOSCOPY N/A 05/15/2016   Procedure: ESOPHAGOGASTRODUODENOSCOPY (EGD);  Surgeon: Wonda Horner, MD;  Location: [facility]COPY;  Service: Endoscopy;  Laterality: N/A;  . [place]   "I think they left my appendix"  . PAP SMEAR  2012 AND 2017    OB History    No data available       Home Medications    Prior to Admission medications   Medication Sig Start Date End Date Taking? Authorizing Provider  albuterol (PROVENTIL HFA;VENTOLIN HFA) 108 (90 Base) MCG/ACT inhaler Inhale 2 puffs into the lungs every 4 (four) hours as needed for wheezing or shortness of breath.   Yes Historical Provider, MD  allopurinol (ZYLOPRIM) 100 MG tablet Take 100 mg by mouth daily.   Yes Historical Provider, MD  amLODipine (NORVASC) 5 MG tablet Take 1 tablet (5 mg total) by mouth daily. 03/11/16 03/06/17 Yes Thayer Headings, MD  apixaban (ELIQUIS) 5 MG TABS tablet Take 1 tablet (5 mg total) by mouth 2 (two) times daily. 04/09/16  Yes Wellington Hampshire, MD  atorvastatin (LIPITOR) 80 MG tablet Take 0.5 tablets (40 mg total) by mouth daily. 04/09/16  Yes Wellington Hampshire, MD  Cholecalciferol (VITAMIN D3) 50000 units TABS Take 50,000 Units by mouth every 7 (seven) days. Tuesday  Yes Historical Provider, MD  esomeprazole (NEXIUM) 20 MG packet Take 20 mg by mouth daily before breakfast.   Yes Historical Provider, MD  HYDROcodone-acetaminophen (NORCO/VICODIN) 5-325 MG tablet Take 1 tablet by mouth every 6 (six) hours as needed for moderate pain.   Yes Historical Provider, MD  insulin NPH-regular Human (NOVOLIN 70/30) (70-30) 100 UNIT/ML injection Inject into the skin. INJECT 35 UNITS IN MORNINGS AND 60 UNITS IN EVENINGS   Yes Historical Provider, MD  Insulin Syringe-Needle U-100 (B-D INS SYR ULTRAFINE 1CC/31G) 31G X 5/16" 1 ML MISC by Does not apply route.   Yes Historical Provider, MD  loratadine (CLARITIN) 10 MG tablet Take 10 mg by mouth daily.   Yes Historical Provider, MD  metFORMIN  (GLUCOPHAGE) 500 MG tablet TAKE 2 TABLETS BY MOUTH EVERY MORNING AND 3 TABLETS BY MOUTH EVERY EVENING   Yes Historical Provider, MD  QUINAPRIL HCL PO Take 40 mg by mouth 2 (two) times daily.   Yes Historical Provider, MD  TRIAMCINOLONE ACETONIDE EX Apply topically.   Yes Historical Provider, MD  triamterene-hydrochlorothiazide (MAXZIDE) 75-50 MG tablet Take 1 tablet by mouth daily.   Yes Historical Provider, MD    Family History Family History  Problem Relation Age of Onset  . Cancer Mother     BREAST  . Hypertension Mother   . Macular degeneration Mother   . Heart attack Father   . Diabetes Father   . Colon polyps Brother   . Colon polyps Brother   . Colon polyps Brother   . Colon polyps Brother   . Colon polyps Brother   . Colon polyps Brother     Social History Social History  Substance Use Topics  . Smoking status: Current Every Day Smoker    Packs/day: 0.50    Years: 40.00    Types: Cigarettes  . Smokeless tobacco: Never Used  . Alcohol use Yes     Comment: 05/14/2016 "I'll have a drink once/year; New Year's"     Allergies   Nsaids and Penicillins   Review of Systems Review of Systems  ROS reviewed and all otherwise negative except for mentioned in HPI   Physical Exam Updated Vital Signs BP (!) 129/56 (BP Location: Right Arm)   Pulse 77   Temp 98.2 F (36.8 C) (Oral)   Resp 20   Ht 5\' 5"  (1.651 m) Comment: from 04/09/16 report  Wt 100.2 kg   SpO2 100%   BMI 36.78 kg/m  Vitals reviewed Physical Exam Physical Examination: General appearance - alert, well appearing, and in no distress Mental status - alert, oriented to person, place, and time Eyes - no conjunctival injection, no scleral icterus Mouth - mucous membranes moist, pharynx normal without lesions Chest - clear to auscultation, no wheezes, rales or rhonchi, symmetric air entry Heart - normal rate, regular rhythm, normal S1, S2, no murmurs, rubs, clicks or gallops Abdomen - soft, nontender,  nondistended, no masses or organomegaly Neurological - alert, oriented, normal speech Extremities - peripheral pulses normal, no pedal edema, no clubbing or cyanosis Skin - normal coloration and turgor, no rashes  ED Treatments / Results  Labs (all labs ordered are listed, but only abnormal results are displayed) Labs Reviewed  CBC - Abnormal; Notable for the following:       Result Value   WBC 13.7 (*)    RBC 1.93 (*)    Hemoglobin 4.9 (*)    HCT 15.7 (*)    MCH 25.4 (*)    RDW 20.4 (*)  Platelets 414 (*)    All other components within normal limits  BASIC METABOLIC PANEL - Abnormal; Notable for the following:    Potassium 5.6 (*)    CO2 18 (*)    Glucose, Bld 136 (*)    Creatinine, Ser 1.38 (*)    Calcium 8.8 (*)    GFR calc non Af Amer 39 (*)    GFR calc Af Amer 45 (*)    All other components within normal limits  VITAMIN B12 - Abnormal; Notable for the following:    Vitamin B-12 96 (*)    All other components within normal limits  IRON AND TIBC - Abnormal; Notable for the following:    Iron 5 (*)    TIBC 510 (*)    Saturation Ratios 1 (*)    All other components within normal limits  FERRITIN - Abnormal; Notable for the following:    Ferritin 3 (*)    All other components within normal limits  RETICULOCYTES - Abnormal; Notable for the following:    RBC. 2.00 (*)    All other components within normal limits  CBC - Abnormal; Notable for the following:    WBC 10.9 (*)    RBC 2.99 (*)    Hemoglobin 7.8 (*)    HCT 24.2 (*)    RDW 18.4 (*)    All other components within normal limits  BASIC METABOLIC PANEL - Abnormal; Notable for the following:    Potassium 5.4 (*)    CO2 20 (*)    Glucose, Bld 150 (*)    Creatinine, Ser 1.28 (*)    GFR calc non Af Amer 43 (*)    GFR calc Af Amer 50 (*)    All other components within normal limits  GLUCOSE, CAPILLARY - Abnormal; Notable for the following:    Glucose-Capillary 119 (*)    All other components within normal  limits  HEMOGLOBIN AND HEMATOCRIT, BLOOD - Abnormal; Notable for the following:    Hemoglobin 6.7 (*)    HCT 21.1 (*)    All other components within normal limits  GLUCOSE, CAPILLARY - Abnormal; Notable for the following:    Glucose-Capillary 162 (*)    All other components within normal limits  GLUCOSE, CAPILLARY - Abnormal; Notable for the following:    Glucose-Capillary 151 (*)    All other components within normal limits  GLUCOSE, CAPILLARY - Abnormal; Notable for the following:    Glucose-Capillary 171 (*)    All other components within normal limits  CBC - Abnormal; Notable for the following:    WBC 12.2 (*)    RBC 2.97 (*)    Hemoglobin 7.8 (*)    HCT 24.3 (*)    RDW 18.8 (*)    All other components within normal limits  BASIC METABOLIC PANEL - Abnormal; Notable for the following:    CO2 20 (*)    Glucose, Bld 149 (*)    Creatinine, Ser 1.09 (*)    Calcium 8.8 (*)    GFR calc non Af Amer 52 (*)    All other components within normal limits  GLUCOSE, CAPILLARY - Abnormal; Notable for the following:    Glucose-Capillary 220 (*)    All other components within normal limits  GLUCOSE, CAPILLARY - Abnormal; Notable for the following:    Glucose-Capillary 174 (*)    All other components within normal limits  GLUCOSE, CAPILLARY - Abnormal; Notable for the following:    Glucose-Capillary 147 (*)    All  other components within normal limits  POC OCCULT BLOOD, ED - Abnormal; Notable for the following:    Fecal Occult Bld POSITIVE (*)    All other components within normal limits  FOLATE  TYPE AND SCREEN  PREPARE RBC (CROSSMATCH)  ABO/RH  PREPARE RBC (CROSSMATCH)    EKG  EKG Interpretation  Date/Time:  Tuesday May 14 2016 10:14:32 EST Ventricular Rate:  114 PR Interval:    QRS Duration: 84 QT Interval:  329 QTC Calculation: 453 R Axis:   30 Text Interpretation:  Atrial fibrillation Repol abnrm suggests ischemia, anterolateral No old tracing to compare Confirmed  by [facility]  MD, Stebbins [phone]) on 05/14/2016 10:46:30 AM       Radiology Dg Chest 2 View  Result Date: 05/14/2016 CLINICAL DATA:  Pt presents with two days shortness of breath, anxiety noted. Was contacted by MD told to come to ED right away due to abnormal labs. Low hemoglobin. EXAM: CHEST  2 VIEW COMPARISON:  02/02/2016 FINDINGS: Bilateral costophrenic angles are excluded from the field of view. There is no focal parenchymal opacity. There is no pleural effusion or pneumothorax. There is mild stable cardiomegaly. The osseous structures are unremarkable. IMPRESSION: No active cardiopulmonary disease. Electronically Signed   By: Kathreen Devoid   On: 05/14/2016 11:36    Procedures Procedures (including critical care time)  Medications Ordered in ED Medications  pantoprazole (PROTONIX) 80 mg in sodium chloride 0.9 % 250 mL (0.32 mg/mL) infusion (8 mg/hr Intravenous New Bag/Given 05/16/16 0303)  pantoprazole (PROTONIX) injection 40 mg ( Intravenous MAR Unhold 05/15/16 1309)  HYDROcodone-acetaminophen (NORCO/VICODIN) 5-325 MG per tablet 1 tablet ( Oral MAR Unhold 05/15/16 1309)  allopurinol (ZYLOPRIM) tablet 100 mg (100 mg Oral Given 05/16/16 0831)  atorvastatin (LIPITOR) tablet 40 mg (40 mg Oral Given 05/16/16 0831)  diltiazem (CARDIZEM) injection 10 mg ( Intravenous MAR Unhold 05/15/16 1309)  hydrALAZINE (APRESOLINE) injection 5-10 mg ( Intravenous MAR Unhold 05/15/16 1309)  diclofenac sodium (VOLTAREN) 1 % transdermal gel 4 g ( Topical MAR Unhold 05/15/16 1309)  sodium chloride flush (NS) 0.9 % injection 3 mL (3 mLs Intravenous Not Given 05/16/16 0831)  0.9 %  sodium chloride infusion ( Intravenous New Bag/Given 05/14/16 1634)  ondansetron (ZOFRAN) tablet 4 mg ( Oral MAR Unhold 05/15/16 1309)    Or  ondansetron (ZOFRAN) injection 4 mg ( Intravenous MAR Unhold 05/15/16 1309)  insulin aspart (novoLOG) injection 0-9 Units (1 Units Subcutaneous Given 05/16/16 0829)  insulin aspart (novoLOG) injection 0-5  Units (0 Units Subcutaneous Not Given 05/15/16 2200)  metoprolol (LOPRESSOR) tablet 50 mg (0 mg Oral Hold 05/16/16 1000)  metoprolol (LOPRESSOR) injection 5 mg ( Intravenous MAR Unhold 05/15/16 1309)  iron dextran complex (INFED) 25 mg in sodium chloride 0.9 % 50 mL IVPB (25 mg Intravenous Given 05/15/16 1446)    Followed by  iron dextran complex (INFED) 500 mg in sodium chloride 0.9 % 250 mL IVPB (500 mg Intravenous Incomplete 05/16/16 0847)  0.9 %  sodium chloride infusion (10 mL/hr Intravenous Transfusing/Transfer 05/14/16 1545)  pantoprazole (PROTONIX) 80 mg in sodium chloride 0.9 % 100 mL IVPB (0 mg Intravenous Stopped 05/14/16 1228)  sodium chloride 0.9 % bolus 1,000 mL (0 mLs Intravenous Stopped 05/14/16 1545)  0.9 %  sodium chloride infusion ( Intravenous New Bag/Given 05/14/16 2315)  sodium polystyrene (KAYEXALATE) 15 GM/60ML suspension 30 g (30 g Oral Given 05/15/16 1447)    This patients CHA2DS2-VASc Score and unadjusted Ischemic Stroke Rate (% per year) is equal to 2.2 %  stroke rate/year from a score of 2  Above score calculated as 1 point each if present [CHF, HTN, DM, Vascular=MI/PAD/Aortic Plaque, Age if 65-74, or Female] Above score calculated as 2 points each if present [Age > 75, or Stroke/TIA/TE]     CRITICAL CARE Performed by: Threasa Beards Total critical care time: 40 minutes Critical care time was exclusive of separately billable procedures and treating other patients. Critical care was necessary to treat or prevent imminent or life-threatening deterioration. Critical care was time spent personally by me on the following activities: development of treatment plan with patient and/or surrogate as well as nursing, discussions with consultants, evaluation of patient's response to treatment, examination of patient, obtaining history from patient or surrogate, ordering and performing treatments and interventions, ordering and review of laboratory studies, ordering and review of  radiographic studies, pulse oximetry and re-evaluation of patient's condition.    Initial Impression / Assessment and Plan / ED Course  I have reviewed the triage vital signs and the nursing notes.  Pertinent labs & imaging results that were available during my care of the patient were reviewed by me and considered in my medical decision making (see chart for details).   12:18 PM d/w GI he will see patient in consultation.  Paging hospitalist now.   Pt presenting with new finding of anemia after one month of being on eliquis.  IV protonix drip started, PRBC  Transfusion ordered.  GI consulted and patient admitted to medical service.    Final Clinical Impressions(s) / ED Diagnoses   Final diagnoses:  Upper GI bleed  Anemia, unspecified type  Renal insufficiency    New Prescriptions Current Discharge Medication List       Alfonzo Beers, MD 05/16/16 [phone]

## 2016-05-14 NOTE — H&P (Addendum)
History and Physical    Paula Wheeler:[id number] DOB: 12-02-1950 DOA: 05/14/2016  PCP: Gennette Pac, MD Patient coming from: home  Chief Complaint: fatigue and lab abnormality  HPI: Paula Wheeler is a 66 y.o. female with medical history significant of iron deficiency anemia, diabetes, HL D, GERD, HTN, referral edema, tobacco use presenting with progressive fatigue. Started approximately 4-5 days ago. Worse with exertion. Improves with rest. Denies any associated fevers, cough, chest pain, palpitations, neck stiffness, headache, abdominal pain, dysuria, frequency, hematochezia, hematemesis. Associated with malonic stools for the past 2-3 weeks. Of note patient was started Eliquis approximately 4 weeks ago. Patient endorses a normal colonoscopy less than 10 years ago by [facility] physicians. Patient was seen by her cardiologist 1 day ago where labs were drawn. She was called this morning and told to come to the emergency room due to a severely low hemoglobin.  ED Course: Objective findings outlined below. 2 units PRBC ordered by EDP  Review of Systems: As per HPI otherwise 10 point review of systems negative.   Ambulatory Status: Restricted only by body habitus and arthritis.  Past Medical History:  Diagnosis Date  . Abnormal urine findings   . Abnormal vaginal Pap smear   . Allergic rhinitis   . Anemia   . Cardiomegaly   . Diabetes mellitus without complication ([place])   . Dyslipidemia   . Dyspnea   . Encounter for screening for malignant neoplasm of cervix   . Exogenous obesity   . GERD (gastroesophageal reflux disease)   . H/O: gout   . History of abnormal cells from cervix   . Hyperlipidemia   . Hypertension   . Iron deficiency anemia   . Microalbuminuria   . Peripheral edema   . Tobacco dependence   . Tobacco use   . Wax in ear     Past Surgical History:  Procedure Laterality Date  . APPENDECTOMY     AGE 44  . DIAGNOSTIC MAMMOGRAM  05/2015  . EXPLORATORY  LAPAROTOMY    . PAP SMEAR  2012 AND 2017    Social History   Social History  . Marital status: Single    Spouse name: N/A  . Number of children: N/A  . Years of education: N/A   Occupational History  . Not on file.   Social History Main Topics  . Smoking status: Current Every Day Smoker    Packs/day: 0.50    Types: Cigarettes  . Smokeless tobacco: Never Used  . Alcohol use Yes     Comment: rarely  . Drug use: No  . Sexual activity: No   Other Topics Concern  . Not on file   Social History Narrative  . No narrative on file    Allergies  Allergen Reactions  . Nsaids Swelling  . Penicillins Hives    Family History  Problem Relation Age of Onset  . Cancer Mother     BREAST  . Hypertension Mother   . Macular degeneration Mother   . Heart attack Father   . Diabetes Father   . Colon polyps Brother   . Colon polyps Brother   . Colon polyps Brother   . Colon polyps Brother   . Colon polyps Brother   . Colon polyps Brother     Prior to Admission medications   Medication Sig Start Date End Date Taking? Authorizing Provider  albuterol (PROVENTIL HFA;VENTOLIN HFA) 108 (90 Base) MCG/ACT inhaler Inhale 2 puffs into the lungs every 4 (four) hours  as needed for wheezing or shortness of breath.   Yes Historical Provider, MD  allopurinol (ZYLOPRIM) 100 MG tablet Take 100 mg by mouth daily.   Yes Historical Provider, MD  amLODipine (NORVASC) 5 MG tablet Take 1 tablet (5 mg total) by mouth daily. 03/11/16 03/06/17 Yes Thayer Headings, MD  apixaban (ELIQUIS) 5 MG TABS tablet Take 1 tablet (5 mg total) by mouth 2 (two) times daily. 04/09/16  Yes Wellington Hampshire, MD  atorvastatin (LIPITOR) 80 MG tablet Take 0.5 tablets (40 mg total) by mouth daily. 04/09/16  Yes Wellington Hampshire, MD  Cholecalciferol (VITAMIN D3) 50000 units TABS Take 50,000 Units by mouth every 7 (seven) days. Tuesday   Yes Historical Provider, MD  esomeprazole (NEXIUM) 20 MG packet Take 20 mg by mouth daily  before breakfast.   Yes Historical Provider, MD  HYDROcodone-acetaminophen (NORCO/VICODIN) 5-325 MG tablet Take 1 tablet by mouth every 6 (six) hours as needed for moderate pain.   Yes Historical Provider, MD  indomethacin (INDOCIN) 25 MG capsule 1-2 CAPSULES EVERY 6-8 HOURS WITH FOOD PRN FOR PAIN   Yes Historical Provider, MD  insulin NPH-regular Human (NOVOLIN 70/30) (70-30) 100 UNIT/ML injection Inject into the skin. INJECT 35 UNITS IN MORNINGS AND 60 UNITS IN EVENINGS   Yes Historical Provider, MD  Insulin Syringe-Needle U-100 (B-D INS SYR ULTRAFINE 1CC/31G) 31G X 5/16" 1 ML MISC by Does not apply route.   Yes Historical Provider, MD  loratadine (CLARITIN) 10 MG tablet Take 10 mg by mouth daily.   Yes Historical Provider, MD  metFORMIN (GLUCOPHAGE) 500 MG tablet TAKE 2 TABLETS BY MOUTH EVERY MORNING AND 3 TABLETS BY MOUTH EVERY EVENING   Yes Historical Provider, MD  QUINAPRIL HCL PO Take 40 mg by mouth 2 (two) times daily.   Yes Historical Provider, MD  TRIAMCINOLONE ACETONIDE EX Apply topically.   Yes Historical Provider, MD  triamterene-hydrochlorothiazide (MAXZIDE) 75-50 MG tablet Take 1 tablet by mouth daily.   Yes Historical Provider, MD    Physical Exam: Vitals:   05/14/16 1014 05/14/16 1015 05/14/16 1312  BP: (!) 147/49  (!) 132/52  Pulse:   111  Resp: 20  22  Temp: 98 F (36.7 C)  98.2 F (36.8 C)  TempSrc: Oral  Oral  SpO2: 100% 100% 100%  Weight:  100.2 kg (221 lb)      General:  Appears calm and comfortable, becomes easily winded w/ even minimal movement.  Eyes:  PERRL, EOMI, normal lids, iris ENT:  grossly normal hearing, lips & tongue, mmm Neck:  no LAD, masses or thyromegaly Cardiovascular: RRR, no m/r/g. 1+ LE edema bilat.  Respiratory:  CTA bilaterally, no w/r/r. Normal respiratory effort. Abdomen:  soft, ntnd, NABS Skin:  no rash or induration seen on limited exam Musculoskeletal:  grossly normal tone BUE/BLE, good ROM, no bony abnormality Psychiatric:   grossly normal mood and affect, speech fluent and appropriate, AOx3 Neurologic:  CN 2-12 grossly intact, moves all extremities in coordinated fashion, sensation intact  Labs on Admission: I have personally reviewed following labs and imaging studies  CBC:  Recent Labs Lab 05/13/16 0825 05/14/16 1032  WBC 15.6* 13.7*  HGB 5.7* 4.9*  HCT 19.0* 15.7*  MCV 82.3 81.3  PLT 495* 601*   Basic Metabolic Panel:  Recent Labs Lab 05/13/16 0825 05/14/16 1032  NA 134* 136  K 6.5* 5.6*  CL 107 108  CO2 18* 18*  GLUCOSE 177* 136*  BUN 20 17  CREATININE 1.41* 1.38*  CALCIUM 8.8 8.8*   GFR: Estimated Creatinine Clearance: 47.7 mL/min (by C-G formula based on SCr of 1.38 mg/dL (H)). Liver Function Tests: No results for input(s): AST, ALT, ALKPHOS, BILITOT, PROT, ALBUMIN in the last 168 hours. No results for input(s): LIPASE, AMYLASE in the last 168 hours. No results for input(s): AMMONIA in the last 168 hours. Coagulation Profile: No results for input(s): INR, PROTIME in the last 168 hours. Cardiac Enzymes: No results for input(s): CKTOTAL, CKMB, CKMBINDEX, TROPONINI in the last 168 hours. BNP (last 3 results) No results for input(s): PROBNP in the last 8760 hours. HbA1C: No results for input(s): HGBA1C in the last 72 hours. CBG: No results for input(s): GLUCAP in the last 168 hours. Lipid Profile: No results for input(s): CHOL, HDL, LDLCALC, TRIG, CHOLHDL, LDLDIRECT in the last 72 hours. Thyroid Function Tests: No results for input(s): TSH, T4TOTAL, FREET4, T3FREE, THYROIDAB in the last 72 hours. Anemia Panel:  Recent Labs  05/14/16 1219  RETICCTPCT 3.1   Urine analysis: No results found for: COLORURINE, APPEARANCEUR, LABSPEC, PHURINE, GLUCOSEU, HGBUR, BILIRUBINUR, KETONESUR, PROTEINUR, UROBILINOGEN, NITRITE, LEUKOCYTESUR  Creatinine Clearance: Estimated Creatinine Clearance: 47.7 mL/min (by C-G formula based on SCr of 1.38 mg/dL (H)).  Sepsis  Labs: @LABRCNTIP (procalcitonin:4,lacticidven:4) )No results found for this or any previous visit (from the past 240 hour(s)).   Radiological Exams on Admission: Dg Chest 2 View  Result Date: 05/14/2016 CLINICAL DATA:  Pt presents with two days shortness of breath, anxiety noted. Was contacted by MD told to come to ED right away due to abnormal labs. Low hemoglobin. EXAM: CHEST  2 VIEW COMPARISON:  02/02/2016 FINDINGS: Bilateral costophrenic angles are excluded from the field of view. There is no focal parenchymal opacity. There is no pleural effusion or pneumothorax. There is mild stable cardiomegaly. The osseous structures are unremarkable. IMPRESSION: No active cardiopulmonary disease. Electronically Signed   By: Kathreen Devoid   On: 05/14/2016 11:36    EKG: Independently reviewed. Afib. No ACS.   Assessment/Plan Active Problems:   GI bleed   Symptomatic anemia   Chronic atrial fibrillation (HCC)   Diabetes mellitus with complication (HCC)   Essential hypertension   HLD (hyperlipidemia)   Symptomatic anemia: Per review of equal physician's records patient's last known hemoglobin was 11.7 in May 2017. Current hemoglobin 4.7 which is down from 5.7 the day before. FOBT positive and malonic stool on exam. Patient with history of iron deficiency anemia. - 2 units PRBC ordered by EDP - Posttransfusion hemoglobin with repeat CBC in a.m. - Hold Eliquis. Further anticoagulation discussions will need to be had between gastroenterology and cardiology.  GI bleed: no h/o of the same. Last colonoscopy reported to be less than 10 years ago and w/o reported abnromality. Performed by [facility]. Melanic stool for past 3 weeks. Likely from Eliquis w/ LAST DOSE IN AM ON 05/14/16. On Indomethacin.  - NPO for presumed EGD or possible Colo - further workup per [facility].  - Hold Eliquis - resume anticoagulation after workup and discussion w/ cardiology - Protonix BID - DC Indomethacin and start voltaren  gel  Renal insufficiency: Cr 1.48 on admission. Unknown baseline. Not in care everywhere. May be from hypoperfusion from hypotension and anemia vs chronic.  - IVF - BMET in am  Afib: chronic. Rate controlled. Slight tachycardia likely from extreme anemia - hold Eliquis until after GI bleed resolves and resume only after discussion w/ cards (reduce dose vs other NOAC vs Coumadin vs ASA/Plavix...). Curbside cards in am. - Dilt  PRN Rate >120 sustained.  - Trop x1 - Tele  DM: - SSI  HTN: normotensive to hypotensive likel;y from anemia - resume Maxzide, norvasc in am  - Hydralazine PRN  HLD: - continue statin    DVT prophylaxis: SCD  Code Status: full  Family Communication: friend  Disposition Plan: pending workup by GI  Consults called: GI - [facility]  Admission status: obs    Kelaiah Escalona J MD [facility]  If 7PM-7AM, please contact night-coverage [email or web address] Password [email or web address]  05/14/2016, 1:19 PM

## 2016-05-14 NOTE — Consult Note (Signed)
Subjective:   HPI  The patient is a 66 year old female who has been feeling fatigued for about a week and having dyspnea with exertion. She went to see her cardiologist who found that she was significantly anemic and recommended she go to the emergency room. She has been experiencing melena for 3 or 4 weeks. She denies hematemesis. She denies abdominal pain. She is on Eliquis for atrial fibrillation.  Review of Systems Denies chest pain  Past Medical History:  Diagnosis Date  . Abnormal urine findings   . Abnormal vaginal Pap smear   . Allergic rhinitis   . Anemia   . Cardiomegaly   . Diabetes mellitus without complication ([place])   . Dyslipidemia   . Dyspnea   . Encounter for screening for malignant neoplasm of cervix   . Exogenous obesity   . GERD (gastroesophageal reflux disease)   . H/O: gout   . History of abnormal cells from cervix   . Hyperlipidemia   . Hypertension   . Iron deficiency anemia   . Microalbuminuria   . Peripheral edema   . Tobacco dependence   . Tobacco use   . Wax in ear    Past Surgical History:  Procedure Laterality Date  . APPENDECTOMY     AGE 18  . DIAGNOSTIC MAMMOGRAM  05/2015  . EXPLORATORY LAPAROTOMY    . PAP SMEAR  2012 AND 2017   Social History   Social History  . Marital status: Single    Spouse name: N/A  . Number of children: N/A  . Years of education: N/A   Occupational History  . Not on file.   Social History Main Topics  . Smoking status: Current Every Day Smoker    Packs/day: 0.50    Types: Cigarettes  . Smokeless tobacco: Never Used  . Alcohol use Yes     Comment: rarely  . Drug use: No  . Sexual activity: No   Other Topics Concern  . Not on file   Social History Narrative  . No narrative on file   family history includes Cancer in her mother; Colon polyps in her brother, brother, brother, brother, brother, and brother; Diabetes in her father; Heart attack in her father; Hypertension in her mother; Macular  degeneration in her mother.  Current Facility-Administered Medications:  .  0.9 %  sodium chloride infusion, , Intravenous, Continuous, Waldemar Dickens, MD, Last Rate: 75 mL/hr at 05/14/16 1336 .  [START ON 05/15/2016] allopurinol (ZYLOPRIM) tablet 100 mg, 100 mg, Oral, Daily, Waldemar Dickens, MD .  Derrill Memo ON 05/15/2016] amLODipine (NORVASC) tablet 5 mg, 5 mg, Oral, Daily, Waldemar Dickens, MD .  Derrill Memo ON 05/15/2016] atorvastatin (LIPITOR) tablet 40 mg, 40 mg, Oral, Daily, Waldemar Dickens, MD .  diclofenac sodium (VOLTAREN) 1 % transdermal gel 4 g, 4 g, Topical, QID PRN, Waldemar Dickens, MD .  diltiazem (CARDIZEM) injection 10 mg, 10 mg, Intravenous, Q4H PRN, Waldemar Dickens, MD .  hydrALAZINE (APRESOLINE) injection 5-10 mg, 5-10 mg, Intravenous, Q4H PRN, Waldemar Dickens, MD .  HYDROcodone-acetaminophen (NORCO/VICODIN) 5-325 MG per tablet 1 tablet, 1 tablet, Oral, Q6H PRN, Waldemar Dickens, MD .  insulin aspart (novoLOG) injection 0-5 Units, 0-5 Units, Subcutaneous, QHS, Waldemar Dickens, MD .  insulin aspart (novoLOG) injection 0-9 Units, 0-9 Units, Subcutaneous, TID WC, Waldemar Dickens, MD .  ondansetron [facility]) tablet 4 mg, 4 mg, Oral, Q6H PRN **OR** ondansetron (ZOFRAN) injection 4 mg, 4 mg, Intravenous, Q6H PRN, Shanon Brow  Nada Maclachlan, MD .  pantoprazole (PROTONIX) 80 mg in sodium chloride 0.9 % 250 mL (0.32 mg/mL) infusion, 8 mg/hr, Intravenous, Continuous, Alfonzo Beers, MD, Last Rate: 25 mL/hr at 05/14/16 1221, 8 mg/hr at 05/14/16 1221 .  [START ON 05/17/2016] pantoprazole (PROTONIX) injection 40 mg, 40 mg, Intravenous, Q12H, Alfonzo Beers, MD .  sodium chloride flush (NS) 0.9 % injection 3 mL, 3 mL, Intravenous, Q12H, Waldemar Dickens, MD .  Derrill Memo ON 05/15/2016] triamterene-hydrochlorothiazide (MAXZIDE) 75-50 MG per tablet 1 tablet, 1 tablet, Oral, Daily, Waldemar Dickens, MD  Current Outpatient Prescriptions:  .  albuterol (PROVENTIL HFA;VENTOLIN HFA) 108 (90 Base) MCG/ACT inhaler, Inhale 2 puffs  into the lungs every 4 (four) hours as needed for wheezing or shortness of breath., Disp: , Rfl:  .  allopurinol (ZYLOPRIM) 100 MG tablet, Take 100 mg by mouth daily., Disp: , Rfl:  .  amLODipine (NORVASC) 5 MG tablet, Take 1 tablet (5 mg total) by mouth daily., Disp: 90 tablet, Rfl: 3 .  apixaban (ELIQUIS) 5 MG TABS tablet, Take 1 tablet (5 mg total) by mouth 2 (two) times daily., Disp: 60 tablet, Rfl: 8 .  atorvastatin (LIPITOR) 80 MG tablet, Take 0.5 tablets (40 mg total) by mouth daily., Disp: 90 tablet, Rfl: 3 .  Cholecalciferol (VITAMIN D3) 50000 units TABS, Take 50,000 Units by mouth every 7 (seven) days. Tuesday, Disp: , Rfl:  .  esomeprazole (NEXIUM) 20 MG packet, Take 20 mg by mouth daily before breakfast., Disp: , Rfl:  .  HYDROcodone-acetaminophen (NORCO/VICODIN) 5-325 MG tablet, Take 1 tablet by mouth every 6 (six) hours as needed for moderate pain., Disp: , Rfl:  .  insulin NPH-regular Human (NOVOLIN 70/30) (70-30) 100 UNIT/ML injection, Inject into the skin. INJECT 35 UNITS IN MORNINGS AND 60 UNITS IN EVENINGS, Disp: , Rfl:  .  Insulin Syringe-Needle U-100 (B-D INS SYR ULTRAFINE 1CC/31G) 31G X 5/16" 1 ML MISC, by Does not apply route., Disp: , Rfl:  .  loratadine (CLARITIN) 10 MG tablet, Take 10 mg by mouth daily., Disp: , Rfl:  .  metFORMIN (GLUCOPHAGE) 500 MG tablet, TAKE 2 TABLETS BY MOUTH EVERY MORNING AND 3 TABLETS BY MOUTH EVERY EVENING, Disp: , Rfl:  .  QUINAPRIL HCL PO, Take 40 mg by mouth 2 (two) times daily., Disp: , Rfl:  .  TRIAMCINOLONE ACETONIDE EX, Apply topically., Disp: , Rfl:  .  triamterene-hydrochlorothiazide (MAXZIDE) 75-50 MG tablet, Take 1 tablet by mouth daily., Disp: , Rfl:  Allergies  Allergen Reactions  . Nsaids Swelling  . Penicillins Hives     Objective:     BP 118/60   Pulse 103   Temp 97.3 F (36.3 C) (Oral)   Resp 20   Wt 100.2 kg (221 lb)   SpO2 100%   BMI 36.78 kg/m   She is in no distress  Nonicteric  Heart irregular rhythm no  murmurs  Lungs clear  Abdomen: Bowel sounds present, soft, nontender, no hepatosplenomegaly  Laboratory No components found for: D1    Assessment:     Anemia  Melena      Plan:     Patient is being admitted to the hospital. Eliquis is being held. Recommend PPI therapy. She is being transfused packed red cells. We will plan EGD tomorrow. Lab Results  Component Value Date   HGB 4.9 (LL) 05/14/2016   HGB 5.7 (LL) 05/13/2016   HCT 15.7 (L) 05/14/2016   HCT 19.0 (L) 05/13/2016

## 2016-05-14 NOTE — Progress Notes (Signed)
Paula Wheeler is a 66 y.o. female patient admitted from ED awake, alert - oriented  X 4 - no acute distress noted.  VSS - Blood pressure 113/62, pulse (!) 53, temperature 98.2 F (36.8 C), temperature source Oral, resp. rate 20, weight 100.2 kg (221 lb), SpO2 100 %.    IV in place, occlusive dsg intact without redness.  Orientation to room, and floor completed with information packet given to patient/family.  Patient declined safety video at this time.  Admission INP armband ID verified with patient/family, and in place.   SR up x 2, fall assessment complete, with patient and family able to verbalize understanding of risk associated with falls, and verbalized understanding to call nsg before up out of bed.  Call light within reach, patient able to voice, and demonstrate understanding.  Skin, clean-dry- intact without evidence of bruising, or skin tears.   No evidence of skin break down noted on exam.     Will cont to eval and treat per MD orders.  Celine Ahr, RN 05/14/2016 6:11 PM

## 2016-05-15 ENCOUNTER — Observation Stay (HOSPITAL_COMMUNITY): Payer: 59 | Admitting: Certified Registered Nurse Anesthetist

## 2016-05-15 ENCOUNTER — Encounter (HOSPITAL_COMMUNITY): Payer: Self-pay

## 2016-05-15 ENCOUNTER — Encounter (HOSPITAL_COMMUNITY)
Admission: EM | Disposition: A | Discharge status: Home / Self Care | Payer: Self-pay | Source: Home / Self Care | Attending: Internal Medicine

## 2016-05-15 DIAGNOSIS — D62 Acute posthemorrhagic anemia: Secondary | ICD-10-CM | POA: Diagnosis present

## 2016-05-15 DIAGNOSIS — I482 Chronic atrial fibrillation: Secondary | ICD-10-CM | POA: Diagnosis present

## 2016-05-15 DIAGNOSIS — N179 Acute kidney failure, unspecified: Secondary | ICD-10-CM | POA: Diagnosis present

## 2016-05-15 DIAGNOSIS — I10 Essential (primary) hypertension: Secondary | ICD-10-CM | POA: Diagnosis not present

## 2016-05-15 DIAGNOSIS — E785 Hyperlipidemia, unspecified: Secondary | ICD-10-CM | POA: Diagnosis present

## 2016-05-15 DIAGNOSIS — D509 Iron deficiency anemia, unspecified: Secondary | ICD-10-CM | POA: Diagnosis present

## 2016-05-15 DIAGNOSIS — Z833 Family history of diabetes mellitus: Secondary | ICD-10-CM | POA: Diagnosis not present

## 2016-05-15 DIAGNOSIS — I119 Hypertensive heart disease without heart failure: Secondary | ICD-10-CM | POA: Diagnosis present

## 2016-05-15 DIAGNOSIS — Z8249 Family history of ischemic heart disease and other diseases of the circulatory system: Secondary | ICD-10-CM | POA: Diagnosis not present

## 2016-05-15 DIAGNOSIS — Z87891 Personal history of nicotine dependence: Secondary | ICD-10-CM | POA: Diagnosis not present

## 2016-05-15 DIAGNOSIS — E1151 Type 2 diabetes mellitus with diabetic peripheral angiopathy without gangrene: Secondary | ICD-10-CM | POA: Diagnosis present

## 2016-05-15 DIAGNOSIS — T45515A Adverse effect of anticoagulants, initial encounter: Secondary | ICD-10-CM | POA: Diagnosis present

## 2016-05-15 DIAGNOSIS — I481 Persistent atrial fibrillation: Secondary | ICD-10-CM | POA: Diagnosis present

## 2016-05-15 DIAGNOSIS — E875 Hyperkalemia: Secondary | ICD-10-CM | POA: Diagnosis present

## 2016-05-15 DIAGNOSIS — Y92009 Unspecified place in unspecified non-institutional (private) residence as the place of occurrence of the external cause: Secondary | ICD-10-CM | POA: Diagnosis not present

## 2016-05-15 DIAGNOSIS — K219 Gastro-esophageal reflux disease without esophagitis: Secondary | ICD-10-CM | POA: Diagnosis present

## 2016-05-15 DIAGNOSIS — Z8371 Family history of colonic polyps: Secondary | ICD-10-CM | POA: Diagnosis not present

## 2016-05-15 DIAGNOSIS — E118 Type 2 diabetes mellitus with unspecified complications: Secondary | ICD-10-CM | POA: Diagnosis not present

## 2016-05-15 DIAGNOSIS — Z7901 Long term (current) use of anticoagulants: Secondary | ICD-10-CM | POA: Diagnosis not present

## 2016-05-15 DIAGNOSIS — K922 Gastrointestinal hemorrhage, unspecified: Secondary | ICD-10-CM | POA: Diagnosis present

## 2016-05-15 DIAGNOSIS — M109 Gout, unspecified: Secondary | ICD-10-CM | POA: Diagnosis present

## 2016-05-15 DIAGNOSIS — K254 Chronic or unspecified gastric ulcer with hemorrhage: Secondary | ICD-10-CM | POA: Diagnosis present

## 2016-05-15 DIAGNOSIS — Z803 Family history of malignant neoplasm of breast: Secondary | ICD-10-CM | POA: Diagnosis not present

## 2016-05-15 HISTORY — PX: ESOPHAGOGASTRODUODENOSCOPY: SHX5428

## 2016-05-15 LAB — BASIC METABOLIC PANEL
ANION GAP: 8 (ref 5–15)
BUN: 15 mg/dL (ref 6–20)
CALCIUM: 8.9 mg/dL (ref 8.9–10.3)
CO2: 20 mmol/L — AB (ref 22–32)
Chloride: 110 mmol/L (ref 101–111)
Creatinine, Ser: 1.28 mg/dL — ABNORMAL HIGH (ref 0.44–1.00)
GFR calc Af Amer: 50 mL/min — ABNORMAL LOW (ref >60)
GFR calc non Af Amer: 43 mL/min — ABNORMAL LOW (ref >60)
GLUCOSE: 150 mg/dL — AB (ref 65–99)
Potassium: 5.4 mmol/L — ABNORMAL HIGH (ref 3.5–5.1)
Sodium: 138 mmol/L (ref 135–145)

## 2016-05-15 LAB — CBC
HEMATOCRIT: 24.2 % — AB (ref 36.0–46.0)
Hemoglobin: 7.8 g/dL — ABNORMAL LOW (ref 12.0–15.0)
MCH: 26.1 pg (ref 26.0–34.0)
MCHC: 32.2 g/dL (ref 30.0–36.0)
MCV: 80.9 fL (ref 78.0–100.0)
Platelets: 347 10*3/uL (ref 150–400)
RBC: 2.99 MIL/uL — ABNORMAL LOW (ref 3.87–5.11)
RDW: 18.4 % — AB (ref 11.5–15.5)
WBC: 10.9 10*3/uL — AB (ref 4.0–10.5)

## 2016-05-15 LAB — TYPE AND SCREEN
ABO/RH(D): O POS
Antibody Screen: NEGATIVE
UNIT DIVISION: 0
Unit division: 0
Unit division: 0

## 2016-05-15 LAB — GLUCOSE, CAPILLARY
Glucose-Capillary: 151 mg/dL — ABNORMAL HIGH (ref 65–99)
Glucose-Capillary: 171 mg/dL — ABNORMAL HIGH (ref 65–99)
Glucose-Capillary: 220 mg/dL — ABNORMAL HIGH (ref 65–99)
Glucose-Capillary: 174 mg/dL — ABNORMAL HIGH (ref 65–99)

## 2016-05-15 SURGERY — EGD (ESOPHAGOGASTRODUODENOSCOPY)
Anesthesia: Monitor Anesthesia Care

## 2016-05-15 MED ORDER — PROPOFOL 500 MG/50ML IV EMUL
INTRAVENOUS | Status: DC | PRN
  Administered 2016-05-15: 75 ug/kg/min via INTRAVENOUS

## 2016-05-15 MED ORDER — IRON DEXTRAN 50 MG/ML IJ SOLN
500.0000 mg | Freq: Every day | INTRAMUSCULAR | Status: DC
  Administered 2016-05-15 – 2016-05-16 (×2): 500 mg via INTRAVENOUS
  Filled 2016-05-15 (×4): qty 10

## 2016-05-15 MED ORDER — SODIUM POLYSTYRENE SULFONATE 15 GM/60ML PO SUSP
30.0000 g | Freq: Once | ORAL | Status: AC
  Administered 2016-05-15: 30 g via ORAL
  Filled 2016-05-15: qty 120

## 2016-05-15 MED ORDER — METOPROLOL TARTRATE 5 MG/5ML IV SOLN: 5.0000 mg | INTRAVENOUS | Status: DC | PRN

## 2016-05-15 MED ORDER — SODIUM CHLORIDE 0.9 % IV SOLN
25.0000 mg | Freq: Once | INTRAVENOUS | Status: AC
  Administered 2016-05-15: 25 mg via INTRAVENOUS
  Filled 2016-05-15: qty 0.5

## 2016-05-15 MED ORDER — METOPROLOL TARTRATE 50 MG PO TABS
50.0000 mg | ORAL_TABLET | Freq: Two times a day (BID) | ORAL | Status: DC
  Administered 2016-05-15 (×2): 50 mg via ORAL
  Filled 2016-05-15 (×2): qty 1

## 2016-05-15 MED ORDER — PROPOFOL 10 MG/ML IV BOLUS
INTRAVENOUS | Status: DC | PRN
  Administered 2016-05-15 (×2): 10 mg via INTRAVENOUS

## 2016-05-15 MED ORDER — LACTATED RINGERS IV SOLN
INTRAVENOUS | Status: DC | PRN
  Administered 2016-05-15: 12:00:00 via INTRAVENOUS

## 2016-05-15 NOTE — Progress Notes (Signed)
MEDICATION RELATED CONSULT NOTE - INITIAL   Pharmacy Consult for IV Iron replacement  Indication: Iron deficiency anemia  Allergies  Allergen Reactions  . Nsaids Swelling  . Penicillins Hives    Patient Measurements: Height: 5\' 5"  (165.1 cm) (from 04/09/16 report) Weight: 221 lb (100.2 kg) IBW/kg (Calculated) : 57   Vital Signs: Temp: 97.8 F (36.6 C) (01/17 0527) Temp Source: Oral (01/17 0527) BP: 129/69 (01/17 0527) Pulse Rate: 90 (01/17 0527) Intake/Output from previous day: 01/16 0701 - 01/17 0700 In: 2478.3 [P.O.:220; I.V.:938.3; Blood:1320] Out: 2000 [Urine:2000] Intake/Output from this shift: No intake/output data recorded.  Labs:  Recent Labs  05/13/16 0825 05/14/16 1032 05/14/16 2204 05/15/16 0508  WBC 15.6* 13.7*  --  10.9*  HGB 5.7* 4.9* 6.7* 7.8*  HCT 19.0* 15.7* 21.1* 24.2*  PLT 495* 414*  --  347  CREATININE 1.41* 1.38*  --  1.28*   Estimated Creatinine Clearance: 51.4 mL/min (by C-G formula based on SCr of 1.28 mg/dL (H)).   Microbiology: No results found for this or any previous visit (from the past 720 hour(s)).  Medical History: Past Medical History:  Diagnosis Date  . Abnormal urine findings   . Abnormal vaginal Pap smear   . Allergic rhinitis   . Anemia   . Cardiomegaly   . COPD (chronic obstructive pulmonary disease) ( [place])   . Dyslipidemia   . Dyspnea   . Encounter for screening for malignant neoplasm of cervix   . Exogenous obesity   . GERD (gastroesophageal reflux disease)   . GI bleed 05/14/2016  . H/O: gout   . History of abnormal cells from cervix   . History of blood transfusion 05/14/2016   "got my 1st today" (05/14/2016)  . Hyperlipidemia   . Hypertension   . Iron deficiency anemia   . Microalbuminuria   . Peripheral edema   . Symptomatic anemia 05/14/2016  . Tobacco dependence   . Tobacco use   . Type II diabetes mellitus ([place])   . Wax in ear       Assessment: 66 y.o female with Hgb 7.8 g/dL this morning.  She has a h/o atrial fibrillation on anticoagulation with Eliquis prior to admission, PVD, ongoing tobacco use who presented on 1/16 with upper GI bleeding and acute blood loss anemia.  Dr. Sloan Leiter noted -her last melena was on 1/15. Denies any abdominal pain.  She continues on PPI and is now undergoing an upper GI endoscopy.  CBC as noted above (labs)   Goal of Therapy:  Hgb = 14 g/dL  Plan:  IV Iron Dextran 25 mg test dose, observe patient for 1 hour after test dose infused.  If no adverse reaction, give IV Iron Dextran 500mg  IV daily x3 doses.   Nicole Cella, RPh Clinical Pharmacist Pager: [phone] 05/15/2016 9:14 AM

## 2016-05-15 NOTE — Progress Notes (Addendum)
PROGRESS NOTE        PATIENT DETAILS Name: Paula Wheeler Age: 66 y.o. Sex: female Date of Birth: 02-07-1951 Admit Date: 05/14/2016 Admitting Physician Waldemar Dickens, MD IRC:[id number] Marigene Ehlers, MD  Brief Narrative: Patient is a 66 y.o. female with history of peripheral vascular disease, atrial fibrillation on anticoagulation with Eliquis, ongoing tobacco use presented with upper GI bleeding and acute blood loss anemia. See below for further details  Subjective: Doing well this morning-her last melena was on 1/15. Denies any abdominal pain.   Assessment/Plan: Upper GI bleeding: Her last melanotic stool was on 1/15-this is in a setting of anticoagulation with Eliquis. Continue PPI, GI consulted, plans are for endoscopic evaluation later today.  Acute blood loss anemia: Secondary to above, transfused a total of 3 units of PRBC so far. Hemoglobin much improved at 7.8 this morning. Iron indices consistent with iron deficiency, we'll asked pharmacy to dose IV iron while inpatient. Follow CBC.  History of persistent atrial fibrillation: Rate reasonably well-controlled-we will start low dose metoprolol. Chads2vasc of 4-anticoagulation currently on hold in the setting of GI bleeding. Timing of resumption of anticoagulation will depend on endoscopic evaluation.  Hyperkalemia: Probably in a setting of AKI and ACE inhibitor use-much improved, will give one dose of Kayexalate today. Follow electrolytes  Acute kidney injury: Likely mild prerenal azotemia in a setting of GI bleeding and diuretic use.  Hypertension: Reasonably well-controlled-Continue metoprolol-continue to hold ACE inhibitor, diuretics for now. Follow BP trend and adjust accordingly  Type 2 diabetes: CBGs stable SSI, continue to hold metformin  Peripheral vascular disease: Followed by Dr. Marlyne Beards statin-patient aware of need for risk factor modification  Gout: Continue allopurinol-no signs of  flare  DVT Prophylaxis: SCD's  Code Status: Full code   Family Communication: None at bedside  Disposition Plan: Remain inpatient-likely on 1/18-await endoscopic evaluation  Antimicrobial agents: Anti-infectives    None     Procedures: None  CONSULTS:  GI  Time spent: 25 minutes-Greater than 50% of this time was spent in counseling, explanation of diagnosis, planning of further management, and coordination of care.  MEDICATIONS: Scheduled Meds: . allopurinol  100 mg Oral Daily  . atorvastatin  40 mg Oral Daily  . insulin aspart  0-5 Units Subcutaneous QHS  . insulin aspart  0-9 Units Subcutaneous TID WC  . iron dextran (INFED/DEXFERRUM) infusion  25 mg Intravenous Once   Followed by  . iron dextran (INFED/DEXFERRUM) infusion  500 mg Intravenous Daily  . metoprolol tartrate  50 mg Oral BID  . [START ON 05/17/2016] pantoprazole  40 mg Intravenous Q12H  . sodium chloride flush  3 mL Intravenous Q12H  . sodium polystyrene  30 g Oral Once   Continuous Infusions: . sodium chloride 75 mL/hr at 05/14/16 1634  . pantoprozole (PROTONIX) infusion 8 mg/hr (05/14/16 2211)   PRN Meds:.diclofenac sodium, diltiazem, hydrALAZINE, HYDROcodone-acetaminophen, metoprolol, ondansetron **OR** ondansetron (ZOFRAN) IV   PHYSICAL EXAM: Vital signs: Vitals:   05/15/16 0001 05/15/16 0253 05/15/16 0527 05/15/16 0900  BP: (!) 151/58 (!) 146/66 129/69   Pulse: 98 (!) 105 90   Resp: (!) 22 20 18    Temp: 98 F (36.7 C) 98.1 F (36.7 C) 97.8 F (36.6 C)   TempSrc: Oral Oral Oral   SpO2: 100% 96% 96%   Weight:      Height:    5\' 5"  (  1.651 m)   Filed Weights   05/14/16 1015  Weight: 100.2 kg (221 lb)   Body mass index is 36.78 kg/m.   General appearance :Awake, alert, not in any distress. Speech Clear. Not toxic Looking Eyes:, pupils equally reactive to light and accomodation,no scleral icterus. HEENT: Atraumatic and Normocephalic Neck: supple, no JVD. No cervical  lymphadenopathy. No thyromegaly Resp:Good air entry bilaterally, no added sounds  CVS: S1 S2 irregular, no murmurs.  GI: Bowel sounds present, Non tender and not distended with no gaurding, rigidity or rebound.No organomegaly Extremities: B/L Lower Ext shows no edema, both legs are warm to touch Neurology:  speech clear,Non focal, sensation is grossly intact. Musculoskeletal:No digital cyanosis Skin:No Rash, warm and dry Wounds:N/A  I have personally reviewed following labs and imaging studies  LABORATORY DATA: CBC:  Recent Labs Lab 05/13/16 0825 05/14/16 1032 05/14/16 2204 05/15/16 0508  WBC 15.6* 13.7*  --  10.9*  HGB 5.7* 4.9* 6.7* 7.8*  HCT 19.0* 15.7* 21.1* 24.2*  MCV 82.3 81.3  --  80.9  PLT 495* 414*  --  606    Basic Metabolic Panel:  Recent Labs Lab 05/13/16 0825 05/14/16 1032 05/15/16 0508  NA 134* 136 138  K 6.5* 5.6* 5.4*  CL 107 108 110  CO2 18* 18* 20*  GLUCOSE 177* 136* 150*  BUN 20 17 15   CREATININE 1.41* 1.38* 1.28*  CALCIUM 8.8 8.8* 8.9    GFR: Estimated Creatinine Clearance: 51.4 mL/min (by C-G formula based on SCr of 1.28 mg/dL (H)).  Liver Function Tests: No results for input(s): AST, ALT, ALKPHOS, BILITOT, PROT, ALBUMIN in the last 168 hours. No results for input(s): LIPASE, AMYLASE in the last 168 hours. No results for input(s): AMMONIA in the last 168 hours.  Coagulation Profile: No results for input(s): INR, PROTIME in the last 168 hours.  Cardiac Enzymes: No results for input(s): CKTOTAL, CKMB, CKMBINDEX, TROPONINI in the last 168 hours.  BNP (last 3 results) No results for input(s): PROBNP in the last 8760 hours.  HbA1C: No results for input(s): HGBA1C in the last 72 hours.  CBG:  Recent Labs Lab 05/14/16 1707 05/14/16 2123 05/15/16 0741  GLUCAP 119* 162* 151*    Lipid Profile: No results for input(s): CHOL, HDL, LDLCALC, TRIG, CHOLHDL, LDLDIRECT in the last 72 hours.  Thyroid Function Tests: No results for  input(s): TSH, T4TOTAL, FREET4, T3FREE, THYROIDAB in the last 72 hours.  Anemia Panel:  Recent Labs  05/14/16 1219  VITAMINB12 96*  FOLATE 9.4  FERRITIN 3*  TIBC 510*  IRON 5*  RETICCTPCT 3.1    Urine analysis: No results found for: COLORURINE, APPEARANCEUR, LABSPEC, PHURINE, GLUCOSEU, HGBUR, BILIRUBINUR, KETONESUR, PROTEINUR, UROBILINOGEN, NITRITE, LEUKOCYTESUR  Sepsis Labs: Lactic Acid, Venous No results found for: LATICACIDVEN  MICROBIOLOGY: No results found for this or any previous visit (from the past 240 hour(s)).  RADIOLOGY STUDIES/RESULTS: Dg Chest 2 View  Result Date: 05/14/2016 CLINICAL DATA:  Pt presents with two days shortness of breath, anxiety noted. Was contacted by MD told to come to ED right away due to abnormal labs. Low hemoglobin. EXAM: CHEST  2 VIEW COMPARISON:  02/02/2016 FINDINGS: Bilateral costophrenic angles are excluded from the field of view. There is no focal parenchymal opacity. There is no pleural effusion or pneumothorax. There is mild stable cardiomegaly. The osseous structures are unremarkable. IMPRESSION: No active cardiopulmonary disease. Electronically Signed   By: Kathreen Devoid   On: 05/14/2016 11:36     LOS: 0 days  Oren Binet, MD  [facility] Pager:[phone]  If 7PM-7AM, please contact night-coverage [email or web address] Password [facility] 05/15/2016, 10:36 AM

## 2016-05-15 NOTE — Transfer of Care (Signed)
Immediate Anesthesia Transfer of Care Note  Patient: Paula Wheeler  Procedure(s) Performed: Procedure(s): ESOPHAGOGASTRODUODENOSCOPY (EGD) (N/A)  Patient Location: Endoscopy Unit  Anesthesia Type:MAC  Level of Consciousness: awake, alert  and oriented  Airway & Oxygen Therapy: Patient Spontanous Breathing and Patient connected to nasal cannula oxygen  Post-op Assessment: Report given to RN, Post -op Vital signs reviewed and stable and Patient moving all extremities X 4  Post vital signs: Reviewed and stable  Last Vitals:  Vitals:   05/15/16 0527 05/15/16 1122  BP: 129/69 (!) 159/86  Pulse: 90 (!) 104  Resp: 18 18  Temp: 36.6 C 37.1 C    Last Pain:  Vitals:   05/15/16 1122  TempSrc: Oral  PainSc:          Complications: No apparent anesthesia complications

## 2016-05-15 NOTE — Anesthesia Preprocedure Evaluation (Addendum)
Anesthesia Evaluation  Patient identified by MRN, date of birth, ID band Patient awake    Reviewed: Allergy & Precautions, NPO status , Patient's Chart, lab work & pertinent test results  Airway Mallampati: II  TM Distance: >3 FB Neck ROM: Full    Dental  (+) Dental Advisory Given, Edentulous Upper, Edentulous Lower   Pulmonary shortness of breath and with exertion, COPD, Current Smoker,    breath sounds clear to auscultation       Cardiovascular hypertension, Pt. on medications + dysrhythmias Atrial Fibrillation  Rhythm:Regular     Neuro/Psych    GI/Hepatic GERD  Medicated,  Endo/Other  diabetes, Type 2  Renal/GU Renal disease     Musculoskeletal   Abdominal   Peds  Hematology  (+) anemia ,   Anesthesia Other Findings   Reproductive/Obstetrics                          Anesthesia Physical Anesthesia Plan  ASA: III  Anesthesia Plan: MAC   Post-op Pain Management:    Induction: Intravenous  Airway Management Planned: Natural Airway and Simple Face Mask  Additional Equipment:   Intra-op Plan:   Post-operative Plan:   Informed Consent: I have reviewed the patients History and Physical, chart, labs and discussed the procedure including the risks, benefits and alternatives for the proposed anesthesia with the patient or authorized representative who has indicated his/her understanding and acceptance.   Dental advisory given  Plan Discussed with: CRNA, Anesthesiologist and Surgeon  Anesthesia Plan Comments:         Anesthesia Quick Evaluation

## 2016-05-15 NOTE — Anesthesia Procedure Notes (Signed)
Procedure Name: MAC Date/Time: 05/15/2016 12:15 PM Performed by: Garrison Columbus T Pre-anesthesia Checklist: Patient identified, Emergency Drugs available, Suction available and Patient being monitored Patient Re-evaluated:Patient Re-evaluated prior to inductionOxygen Delivery Method: Simple face mask Preoxygenation: Pre-oxygenation with 100% oxygen Intubation Type: IV induction Placement Confirmation: positive ETCO2 and breath sounds checked- equal and bilateral Dental Injury: Teeth and Oropharynx as per pre-operative assessment

## 2016-05-15 NOTE — Op Note (Signed)
[facility] Patient Name: Paula Wheeler Procedure Date : 05/15/2016 MRN: [id number] Attending MD: Wonda Horner , MD Date of Birth: 12-31-1950 CSN: [id number] Age: 66 Admit Type: Inpatient Procedure:                Upper GI endoscopy Indications:              Iron deficiency anemia, Melena Providers:                Wonda Horner, MD, Cleda Daub, RN, Elspeth Cho Tech., Technician, Garrison Columbus, CRNA Referring MD:              Medicines:                Propofol per Anesthesia Complications:            No immediate complications. Estimated Blood Loss:     Estimated blood loss: none. Procedure:                Pre-Anesthesia Assessment:                           - Prior to the procedure, a History and Physical                            was performed, and patient medications and                            allergies were reviewed. The patient's tolerance of                            previous anesthesia was also reviewed. The risks                            and benefits of the procedure and the sedation                            options and risks were discussed with the patient.                            All questions were answered, and informed consent                            was obtained. Prior Anticoagulants: The patient has                            taken Eliquis (apixaban), last dose was 1 day prior                            to procedure. ASA Grade Assessment: III - A patient                            with severe systemic disease. After reviewing the  risks and benefits, the patient was deemed in                            satisfactory condition to undergo the procedure.                           After obtaining informed consent, the endoscope was                            passed under direct vision. Throughout the                            procedure, the patient's blood pressure, pulse, and                             oxygen saturations were monitored continuously. The                            EG-[id number] ([id number]) scope was introduced through the                            mouth, and advanced to the second part of duodenum.                            The upper GI endoscopy was accomplished without                            difficulty. The patient tolerated the procedure                            well. Scope In: Scope Out: Findings:      The examined esophagus was normal.      Two erosions were found in the prepyloric region of the stomach.      A few small erosions were found in the gastric body.      The examined duodenum was normal. Impression:               - Normal esophagus.                           - Erosive gastropathy.                           - Erosive gastropathy.                           - Normal examined duodenum.                           - No specimens collected. Moderate Sedation:      .      Marland Kitchen Recommendation:           - Resume regular diet.                           - Continue present medications. Would hold Eliquis  for a few more days and continue PPI. Procedure Code(s):        --- Professional ---                           [phone], Esophagogastroduodenoscopy, flexible,                            transoral; diagnostic, including collection of                            specimen(s) by brushing or washing, when performed                            (separate procedure) Diagnosis Code(s):        --- Professional ---                           K31.89, Other diseases of stomach and duodenum                           D50.9, Iron deficiency anemia, unspecified                           K92.1, Melena (includes Hematochezia) CPT copyright 2016 [employer]. All rights reserved. The codes documented in this report are preliminary and upon coder review may  be revised to meet current compliance requirements. Wonda Horner,  MD 05/15/2016 12:41:29 PM This report has been signed electronically. Number of Addenda: 0

## 2016-05-16 ENCOUNTER — Encounter (HOSPITAL_COMMUNITY): Payer: Self-pay | Admitting: Gastroenterology

## 2016-05-16 DIAGNOSIS — I10 Essential (primary) hypertension: Secondary | ICD-10-CM

## 2016-05-16 LAB — CBC
HCT: 24.3 % — ABNORMAL LOW (ref 36.0–46.0)
HEMOGLOBIN: 7.8 g/dL — AB (ref 12.0–15.0)
MCH: 26.3 pg (ref 26.0–34.0)
MCHC: 32.1 g/dL (ref 30.0–36.0)
MCV: 81.8 fL (ref 78.0–100.0)
Platelets: 379 10*3/uL (ref 150–400)
RBC: 2.97 MIL/uL — ABNORMAL LOW (ref 3.87–5.11)
RDW: 18.8 % — ABNORMAL HIGH (ref 11.5–15.5)
WBC: 12.2 10*3/uL — ABNORMAL HIGH (ref 4.0–10.5)

## 2016-05-16 LAB — BASIC METABOLIC PANEL
Anion gap: 11 (ref 5–15)
BUN: 11 mg/dL (ref 6–20)
CHLORIDE: 105 mmol/L (ref 101–111)
CO2: 20 mmol/L — AB (ref 22–32)
Calcium: 8.8 mg/dL — ABNORMAL LOW (ref 8.9–10.3)
Creatinine, Ser: 1.09 mg/dL — ABNORMAL HIGH (ref 0.44–1.00)
GFR calc Af Amer: >60 mL/min (ref >60)
GFR calc non Af Amer: 52 mL/min — ABNORMAL LOW (ref >60)
Glucose, Bld: 149 mg/dL — ABNORMAL HIGH (ref 65–99)
Potassium: 4.1 mmol/L (ref 3.5–5.1)
SODIUM: 136 mmol/L (ref 135–145)

## 2016-05-16 LAB — GLUCOSE, CAPILLARY: Glucose-Capillary: 147 mg/dL — ABNORMAL HIGH (ref 65–99)

## 2016-05-16 MED ORDER — PANTOPRAZOLE SODIUM 40 MG PO TBEC: 40.0000 mg | DELAYED_RELEASE_TABLET | Freq: Every day | ORAL | 0 refills | Status: DC

## 2016-05-16 MED ORDER — FERROUS SULFATE 325 (65 FE) MG PO TABS: 325.0000 mg | ORAL_TABLET | Freq: Two times a day (BID) | ORAL | 0 refills | Status: DC

## 2016-05-16 NOTE — Anesthesia Postprocedure Evaluation (Addendum)
Anesthesia Post Note  Patient: Paula Wheeler  Procedure(s) Performed: Procedure(s) (LRB): ESOPHAGOGASTRODUODENOSCOPY (EGD) (N/A)  Patient location during evaluation: Endoscopy Anesthesia Type: MAC Level of consciousness: awake and alert Pain management: pain level controlled Vital Signs Assessment: post-procedure vital signs reviewed and stable Respiratory status: spontaneous breathing, nonlabored ventilation, respiratory function stable and patient connected to nasal cannula oxygen Cardiovascular status: stable and blood pressure returned to baseline Anesthetic complications: no       Last Vitals:  Vitals:   05/16/16 0426 05/16/16 0652  BP: (!) 133/53 (!) 129/56  Pulse: 73 77  Resp: 18 20  Temp: 37.2 C 36.8 C    Last Pain:  Vitals:   05/16/16 0652  TempSrc: Oral  PainSc:                  [name]

## 2016-05-16 NOTE — Progress Notes (Signed)
[name] to be D/C'd to home per MD order.  Discussed with the patient and all questions fully answered.  VSS, Skin clean, dry and intact without evidence of skin break down, no evidence of skin tears noted. IV catheter discontinued intact. Site without signs and symptoms of complications. Dressing and pressure applied.  An After Visit Summary was printed and given to the patient. Patient received prescriptions.  D/c education completed with patient/family including follow up instructions, medication list, d/c activities limitations if indicated, with other d/c instructions as indicated by MD - patient able to verbalize understanding, all questions fully answered.   Patient instructed to return to ED, call 911, or call MD for any changes in condition.   Patient escorted via [facility] to emergency room parking lot where car is parked. RN helped to clean snow off car so patient could drive home safely. Pt. Urged to drive slow and be cautious due to weather.  Cosette Prindle L Price 05/16/2016 1:16 PM

## 2016-05-16 NOTE — Discharge Summary (Signed)
PATIENT DETAILS Name: Paula Wheeler Age: 66 y.o. Sex: female Date of Birth: Jul 13, 1950 MRN: [id number]. Admitting Physician: Waldemar Dickens, MD YQM:VHQION,GEXBM Paula Ehlers, MD  Admit Date: 05/14/2016 Discharge date: 05/16/2016  Recommendations for Outpatient Follow-up:  1. Follow up with PCP in 1-2 weeks 2. Please obtain BMP/CBC in one week 3. Hold Eliquis for 1 additional week from the day of discharge, follow CBC in 1 week-if hemoglobin stable-okay to resume anticoagulation  Admitted From:  Home  Disposition: [place]: No  Equipment/Devices: None  Discharge Condition: Stable  CODE STATUS: FULL CODE  Diet recommendation:  Heart Healthy / Carb Modified   Brief Summary: See H&P, Labs, Consult and Test reports for all details in brief, Patient is a 66 y.o. female with history of peripheral vascular disease, atrial fibrillation on anticoagulation with Eliquis, ongoing tobacco use presented with upper GI bleeding and acute blood loss anemia. See below for further details  Brief Hospital Course: Upper GI bleeding: Her last melanotic stool was on 1/15-this is in a setting of anticoagulation with Eliquis. She was managed with PPI, GI was consulted, patient underwent endoscopy on 1/17 which showed erosive gastropathy. Recommendations from GI to continue PPI, continue to hold anticoagulation for 1 more week before resuming. Patient has to follow up with her PCP, recheck CBC in 1 week if stable okay to resume anticoagulation.  Acute blood loss anemia: Secondary to above, transfused a total of 3 units of PRBC so far. Hemoglobin much improved at 7.8 this morning. Iron indices consistent with iron deficiency, we'll asked pharmacy to dose IV iron while inpatient. Follow CBC.  History of persistent atrial fibrillation: Rate reasonably well-controlled-we will start low dose metoprolol. Chads2vasc of 4-anticoagulation currently on hold in the setting of GI bleeding. See above  regarding resumption of anticoagulation  Sinus pauses of around 2 seconds: Seen on telemetry overnight-patient clearly asymptomatic-doubt any clinical significance-she was started on metoprolol yesterday-this has now been discontinued. Continue outpatient follow-up with PCP.  Hyperkalemia: Probably in a setting of AKI and ACE inhibitor use-much improved, will give one dose of Kayexalate today. Will continue to hold quinapril on discharge-recheck electrolytes at next follow-up PCP-if potassium remains stable-okay to resume quinapril.   Acute kidney injury: Significantly improved. Likely mild prerenal azotemia in a setting of GI bleeding and diuretic use.  Hypertension: Reasonably well-controlled-resume usual antihypertensive regimen with the exception of quinapril. See above regarding timing for resumption of quinapril.. Follow BP trend and adjust accordingly  Type 2 diabetes: CBGs stable SSI-resume usual diabetic regimen on discharge.  Peripheral vascular disease: Followed by Dr. Marlyne Beards statin-patient aware of need for risk factor modification  Gout: Continue allopurinol-no signs of flare  Procedures/Studies: EGD 1/17      - Normal esophagus.                           - Erosive gastropathy.                           - Erosive gastropathy.                           - Normal examined duodenum.                           - No specimens collected.  Discharge Diagnoses:  Active Problems:   GI  bleed   Symptomatic anemia   Chronic atrial fibrillation (HCC)   Diabetes mellitus with complication (HCC)   Essential hypertension   HLD (hyperlipidemia)   Renal insufficiency   Discharge Instructions:  Activity:  As tolerated with Full fall precautions use walker/cane & assistance as needed  Discharge Instructions    Diet - low sodium heart healthy    Complete by:  As directed    Discharge instructions    Complete by:  As directed    You had Gastrointestinal  Bleeding: Please ask your Primary MD to check a complete blood count within one week of discharge or at your next visit. Your endoscopic biopsies  are pending at the time of discharge, will also need to followed by your Primary MD or by your primary gastroenterologist  HOLD ELIQUIS FOR 1 WEEK-REPEAT CBC IN 1 WEEK AT YOUR PCP'S OFFICE-IF HEMOGLOBIN IS STABLE-THEN RESUME ELIQUIS AFTER TALKING WITH YOUR PCP  Follow with Primary MD  Gennette Pac, MD  and other consultant's as instructed your Hospitalist MD  Please get a complete blood count and chemistry panel checked by your Primary MD at your next visit, and again as instructed by your Primary MD.  Get Medicines reviewed and adjusted: Please take all your medications with you for your next visit with your Primary MD  Laboratory/radiological data: Please request your Primary MD to go over all hospital tests and procedure/radiological results at the follow up, please ask your Primary MD to get all Hospital records sent to his/her office.  In some cases, they will be blood work, cultures and biopsy results pending at the time of your discharge. Please request that your primary care M.D. follows up on these results.  Also Note the following: If you experience worsening of your admission symptoms, develop shortness of breath, life threatening emergency, suicidal or homicidal thoughts you must seek medical attention immediately by calling 911 or calling your MD immediately  if symptoms less severe.  You must read complete instructions/literature along with all the possible adverse reactions/side effects for all the Medicines you take and that have been prescribed to you. Take any new Medicines after you have completely understood and accpet all the possible adverse reactions/side effects.   Do not drive when taking Pain medications or sleeping medications (Benzodaizepines)  Do not take more than prescribed Pain, Sleep and Anxiety Medications.  It is not advisable to combine anxiety,sleep and pain medications without talking with your primary care practitioner  Special Instructions: If you have smoked or chewed Tobacco  in the last 2 yrs please stop smoking, stop any regular Alcohol  and or any Recreational drug use.  Wear Seat belts while driving.  Please note: You were cared for by a hospitalist during your hospital stay. Once you are discharged, your primary care physician will handle any further medical issues. Please note that NO REFILLS for any discharge medications will be authorized once you are discharged, as it is imperative that you return to your primary care physician (or establish a relationship with a primary care physician if you do not have one) for your post hospital discharge needs so that they can reassess your need for medications and monitor your lab values.   Increase activity slowly    Complete by:  As directed      Allergies as of 05/16/2016      Reactions   Nsaids Swelling   Penicillins Hives      Medication List    STOP taking these medications  apixaban 5 MG Tabs tablet Commonly known as:  ELIQUIS   esomeprazole 20 MG packet Commonly known as:  NEXIUM   QUINAPRIL HCL PO     TAKE these medications   albuterol 108 (90 Base) MCG/ACT inhaler Commonly known as:  PROVENTIL HFA;VENTOLIN HFA Inhale 2 puffs into the lungs every 4 (four) hours as needed for wheezing or shortness of breath.   allopurinol 100 MG tablet Commonly known as:  ZYLOPRIM Take 100 mg by mouth daily.   amLODipine 5 MG tablet Commonly known as:  NORVASC Take 1 tablet (5 mg total) by mouth daily.   atorvastatin 80 MG tablet Commonly known as:  LIPITOR Take 0.5 tablets (40 mg total) by mouth daily.   B-D INS SYR ULTRAFINE 1CC/31G 31G X 5/16" 1 ML Misc Generic drug:  Insulin Syringe-Needle U-100 by Does not apply route.   ferrous sulfate 325 (65 FE) MG tablet Take 1 tablet (325 mg total) by mouth 2 (two) times daily  with a meal.   HYDROcodone-acetaminophen 5-325 MG tablet Commonly known as:  NORCO/VICODIN Take 1 tablet by mouth every 6 (six) hours as needed for moderate pain.   insulin NPH-regular Human (70-30) 100 UNIT/ML injection Commonly known as:  NOVOLIN 70/30 Inject into the skin. INJECT 35 UNITS IN MORNINGS AND 60 UNITS IN EVENINGS   loratadine 10 MG tablet Commonly known as:  CLARITIN Take 10 mg by mouth daily.   metFORMIN 500 MG tablet Commonly known as:  GLUCOPHAGE TAKE 2 TABLETS BY MOUTH EVERY MORNING AND 3 TABLETS BY MOUTH EVERY EVENING   pantoprazole 40 MG tablet Commonly known as:  PROTONIX Take 1 tablet (40 mg total) by mouth daily.   TRIAMCINOLONE ACETONIDE EX Apply topically.   triamterene-hydrochlorothiazide 75-50 MG tablet Commonly known as:  MAXZIDE Take 1 tablet by mouth daily.   Vitamin D3 50000 units Tabs Take 50,000 Units by mouth every 7 (seven) days. Tuesday       Allergies  Allergen Reactions  . Nsaids Swelling  . Penicillins Hives    Consultations:   GI  Other Procedures/Studies: Dg Chest 2 View  Result Date: 05/14/2016 CLINICAL DATA:  Pt presents with two days shortness of breath, anxiety noted. Was contacted by MD told to come to ED right away due to abnormal labs. Low hemoglobin. EXAM: CHEST  2 VIEW COMPARISON:  02/02/2016 FINDINGS: Bilateral costophrenic angles are excluded from the field of view. There is no focal parenchymal opacity. There is no pleural effusion or pneumothorax. There is mild stable cardiomegaly. The osseous structures are unremarkable. IMPRESSION: No active cardiopulmonary disease. Electronically Signed   By: Kathreen Devoid   On: 05/14/2016 11:36      TODAY-DAY OF DISCHARGE:  Subjective:   Paula Wheeler today has no headache,no chest abdominal pain,no new weakness tingling or numbness, feels much better wants to go home today.   Objective:   Blood pressure (!) 129/56, pulse 77, temperature 98.2 F (36.8 C),  temperature source Oral, resp. rate 20, height 5\' 5"  (1.651 m), weight 100.2 kg (221 lb), SpO2 100 %.  Intake/Output Summary (Last 24 hours) at 05/16/16 1116 Last data filed at 05/16/16 0303  Gross per 24 hour  Intake           2547.5 ml  Output              410 ml  Net           2137.5 ml   Filed Weights   05/14/16 1015  Weight:  100.2 kg (221 lb)    Exam: Awake Alert, Oriented *3, No new F.N deficits, Normal affect .AT,PERRAL Supple Neck,No JVD, No cervical lymphadenopathy appriciated.  Symmetrical Chest wall movement, Good air movement bilaterally, CTAB RRR,No Gallops,Rubs or new Murmurs, No Parasternal Heave +ve B.Sounds, Abd Soft, Non tender, No organomegaly appriciated, No rebound -guarding or rigidity. No Cyanosis, Clubbing or edema, No new Rash or bruise   PERTINENT RADIOLOGIC STUDIES: Dg Chest 2 View  Result Date: 05/14/2016 CLINICAL DATA:  Pt presents with two days shortness of breath, anxiety noted. Was contacted by MD told to come to ED right away due to abnormal labs. Low hemoglobin. EXAM: CHEST  2 VIEW COMPARISON:  02/02/2016 FINDINGS: Bilateral costophrenic angles are excluded from the field of view. There is no focal parenchymal opacity. There is no pleural effusion or pneumothorax. There is mild stable cardiomegaly. The osseous structures are unremarkable. IMPRESSION: No active cardiopulmonary disease. Electronically Signed   By: Kathreen Devoid   On: 05/14/2016 11:36     PERTINENT LAB RESULTS: CBC:  Recent Labs  05/15/16 0508 05/16/16 0555  WBC 10.9* 12.2*  HGB 7.8* 7.8*  HCT 24.2* 24.3*  PLT 347 379   CMET CMP     Component Value Date/Time   NA 136 05/16/2016 0555   K 4.1 05/16/2016 0555   CL 105 05/16/2016 0555   CO2 20 (L) 05/16/2016 0555   GLUCOSE 149 (H) 05/16/2016 0555   BUN 11 05/16/2016 0555   CREATININE 1.09 (H) 05/16/2016 0555   CREATININE 1.41 (H) 05/13/2016 0825   CALCIUM 8.8 (L) 05/16/2016 0555   GFRNONAA 52 (L) 05/16/2016 0555    GFRAA >60 05/16/2016 0555    GFR Estimated Creatinine Clearance: 60.4 mL/min (by C-G formula based on SCr of 1.09 mg/dL (H)). No results for input(s): LIPASE, AMYLASE in the last 72 hours. No results for input(s): CKTOTAL, CKMB, CKMBINDEX, TROPONINI in the last 72 hours. Invalid input(s): POCBNP No results for input(s): DDIMER in the last 72 hours. No results for input(s): HGBA1C in the last 72 hours. No results for input(s): CHOL, HDL, LDLCALC, TRIG, CHOLHDL, LDLDIRECT in the last 72 hours. No results for input(s): TSH, T4TOTAL, T3FREE, THYROIDAB in the last 72 hours.  Invalid input(s): FREET3  Recent Labs  05/14/16 1219  VITAMINB12 96*  FOLATE 9.4  FERRITIN 3*  TIBC 510*  IRON 5*  RETICCTPCT 3.1   Coags: No results for input(s): INR in the last 72 hours.  Invalid input(s): PT Microbiology: No results found for this or any previous visit (from the past 240 hour(s)).  FURTHER DISCHARGE INSTRUCTIONS:  Get Medicines reviewed and adjusted: Please take all your medications with you for your next visit with your Primary MD  Laboratory/radiological data: Please request your Primary MD to go over all hospital tests and procedure/radiological results at the follow up, please ask your Primary MD to get all Hospital records sent to his/her office.  In some cases, they will be blood work, cultures and biopsy results pending at the time of your discharge. Please request that your primary care M.D. goes through all the records of your hospital data and follows up on these results.  Also Note the following: If you experience worsening of your admission symptoms, develop shortness of breath, life threatening emergency, suicidal or homicidal thoughts you must seek medical attention immediately by calling 911 or calling your MD immediately  if symptoms less severe.  You must read complete instructions/literature along with all the possible adverse reactions/side effects  for all the  Medicines you take and that have been prescribed to you. Take any new Medicines after you have completely understood and accpet all the possible adverse reactions/side effects.   Do not drive when taking Pain medications or sleeping medications (Benzodaizepines)  Do not take more than prescribed Pain, Sleep and Anxiety Medications. It is not advisable to combine anxiety,sleep and pain medications without talking with your primary care practitioner  Special Instructions: If you have smoked or chewed Tobacco  in the last 2 yrs please stop smoking, stop any regular Alcohol  and or any Recreational drug use.  Wear Seat belts while driving.  Please note: You were cared for by a hospitalist during your hospital stay. Once you are discharged, your primary care physician will handle any further medical issues. Please note that NO REFILLS for any discharge medications will be authorized once you are discharged, as it is imperative that you return to your primary care physician (or establish a relationship with a primary care physician if you do not have one) for your post hospital discharge needs so that they can reassess your need for medications and monitor your lab values.  Total Time spent coordinating discharge including counseling, education and face to face time equals 45 minutes.  SignedOren Binet 05/16/2016 11:16 AM

## 2016-05-16 NOTE — Progress Notes (Signed)
CCMD called to notify about pause on cardiac strip lasting 2.25 sec, also reporting few pauses during the night. Pt denied SOB, chest pain and discomfort. VS stable and pt asymptomatic.  Ghimire, MD notified instruction given to hold metoprolol and monitor pt. Information passed to day shift nurse.

## 2016-05-16 NOTE — Progress Notes (Signed)
Seen and examined No melena Hb stable at 7.8-IV Iron infusion in progress Patient is DESPERATELY wanting to go home-has pets at home-that she cannot leave alone any longer Spoke with Dr Harlow Mares Eliquis for 1 week-if Hb stable at follow up with PCP-ok to resume See d/c summary for details.

## 2016-06-06 ENCOUNTER — Encounter: Payer: Self-pay | Admitting: Cardiovascular Disease

## 2016-06-10 ENCOUNTER — Encounter: Payer: Self-pay | Admitting: Cardiovascular Disease

## 2016-06-10 ENCOUNTER — Ambulatory Visit (INDEPENDENT_AMBULATORY_CARE_PROVIDER_SITE_OTHER): Payer: 59 | Admitting: Cardiovascular Disease

## 2016-06-10 VITALS — BP 130/72 | HR 90 | Ht 66.0 in | Wt 224.8 lb

## 2016-06-10 DIAGNOSIS — I10 Essential (primary) hypertension: Secondary | ICD-10-CM

## 2016-06-10 DIAGNOSIS — I739 Peripheral vascular disease, unspecified: Secondary | ICD-10-CM | POA: Diagnosis not present

## 2016-06-10 DIAGNOSIS — I481 Persistent atrial fibrillation: Secondary | ICD-10-CM

## 2016-06-10 DIAGNOSIS — I4819 Other persistent atrial fibrillation: Secondary | ICD-10-CM | POA: Insufficient documentation

## 2016-06-10 MED ORDER — APIXABAN 5 MG PO TABS: 5.0000 mg | ORAL_TABLET | Freq: Two times a day (BID) | ORAL | 11 refills | Status: DC

## 2016-06-10 NOTE — Patient Instructions (Addendum)
Medication Instructions:  RESTART Eliquis 5 mg twice daily   Labwork: Your physician recommends that you return for lab work in: at next appointment with Dr. Acie Fredrickson for BMET, CBC   Testing/Procedures: None Ordered   Follow-Up: Your physician recommends that you return for follow-up with Dr. Acie Fredrickson on 3/29 @ 8:30 am   If you need a refill on your cardiac medications before your next appointment, please call your pharmacy.   Thank you for choosing [facility]! Christen Bame, RN [phone]

## 2016-06-10 NOTE — Progress Notes (Signed)
Cardiology Office Note   Date:  06/10/2016   ID:  Paula Wheeler, DOB February 08, 1951, MRN [id number]  PCP:  Gennette Pac, MD  Cardiologist:   Mertie Moores, MD   Chief Complaint  Patient presents with  . Follow-up    atrial fib, HTN, PVD   Problem list 1. Essential HTN  2. Hyperlipidemia 3. Cardiomegaly on x-ray 4. Diabetes mellitus 5. Obesity  6.      Nov. 13, 2017: Paula Wheeler is a 66 y.o. female who presents for evaluation of cardiomegaly She requested a CXR to look for lung issues ( is a current smoker) was found have cardiac megaly.  Her main complaint is that her legs give out when she is walking. She does not get any regular exercise.  No CP.   Some shortness of breath with exertion  Has DM,   She is getting better control of her glucose levels recently .    She admits that she eats more salt that she probably should. She's canned soup was on occasions. She put salt on her apples.  Feb. 12, 2018:  Pt was seen by Dr. Fletcher Anon for PVD. Was found to have atrial fib.   CHADS2VASC score is 57 ( female, age 57, HTN, PVD)  Started on Eliquis. Developed a GI bleed.  EGD shows erosive gastropathy.   She was told to stop her Goodies powders and Indomethicin ( which she was taking for gout )  She has seen Dr. Oletta Lamas -  She decided to reduce her Eliquis to 5 mg a day .    Past Medical History:  Diagnosis Date  . Abnormal urine findings   . Abnormal vaginal Pap smear   . Allergic rhinitis   . Anemia   . Cardiomegaly   . COPD (chronic obstructive pulmonary disease) ([place])   . Dyslipidemia   . Dyspnea   . Encounter for screening for malignant neoplasm of cervix   . Exogenous obesity   . GERD (gastroesophageal reflux disease)   . GI bleed 05/14/2016  . H/O: gout   . History of abnormal cells from cervix   . History of blood transfusion 05/14/2016   "got my 1st today" (05/14/2016)  . Hyperlipidemia   . Hypertension   . Iron deficiency anemia   .  Microalbuminuria   . Peripheral edema   . Symptomatic anemia 05/14/2016  . Tobacco dependence   . Tobacco use   . Type II diabetes mellitus ([place])   . Wax in ear     Past Surgical History:  Procedure Laterality Date  . DIAGNOSTIC MAMMOGRAM  05/2015  . ESOPHAGOGASTRODUODENOSCOPY N/A 05/15/2016   Procedure: ESOPHAGOGASTRODUODENOSCOPY (EGD);  Surgeon: Wonda Horner, MD;  Location: [facility] ENDOSCOPY;  Service: Endoscopy;  Laterality: N/A;  . [place]   "I think they left my appendix"  . PAP SMEAR  2012 AND 2017     Current Outpatient Prescriptions  Medication Sig Dispense Refill  . albuterol (PROVENTIL HFA;VENTOLIN HFA) 108 (90 Base) MCG/ACT inhaler Inhale 2 puffs into the lungs every 4 (four) hours as needed for wheezing or shortness of breath.    . allopurinol (ZYLOPRIM) 100 MG tablet Take 100 mg by mouth daily.    Marland Kitchen amLODipine (NORVASC) 5 MG tablet Take 1 tablet (5 mg total) by mouth daily. 90 tablet 3  . apixaban (ELIQUIS) 5 MG TABS tablet Take 5 mg by mouth 2 (two) times daily. PT ONLY TAKING 5 MG ONCE A DAY (06/10/16)    .  atorvastatin (LIPITOR) 80 MG tablet Take 0.5 tablets (40 mg total) by mouth daily. 90 tablet 3  . Cholecalciferol (VITAMIN D3) 50000 units TABS Take 50,000 Units by mouth every 7 (seven) days. Tuesday    . Cyanocobalamin (VITAMIN B-12 IJ) Inject as directed every 14 (fourteen) days. As directed    . ferrous sulfate 325 (65 FE) MG tablet Take 325 mg by mouth daily.    Marland Kitchen HYDROcodone-acetaminophen (NORCO/VICODIN) 5-325 MG tablet Take 1 tablet by mouth every 6 (six) hours as needed for moderate pain.    Marland Kitchen insulin NPH-regular Human (NOVOLIN 70/30) (70-30) 100 UNIT/ML injection Inject into the skin. INJECT 35 UNITS IN MORNINGS AND 60 UNITS IN EVENINGS    . Insulin Syringe-Needle U-100 (B-D INS SYR ULTRAFINE 1CC/31G) 31G X 5/16" 1 ML MISC by Does not apply route.    . loratadine (CLARITIN) 10 MG tablet Take 10 mg by mouth daily.    . metFORMIN (GLUCOPHAGE)  500 MG tablet TAKE 2 TABLETS BY MOUTH EVERY MORNING AND 3 TABLETS BY MOUTH EVERY EVENING    . pantoprazole (PROTONIX) 40 MG tablet Take 1 tablet (40 mg total) by mouth daily. 60 tablet 0  . quinapril (ACCUPRIL) 40 MG tablet Take 40 mg by mouth daily.    . TRIAMCINOLONE ACETONIDE EX Apply 1 application topically every morning.     . triamterene-hydrochlorothiazide (MAXZIDE) 75-50 MG tablet Take 1 tablet by mouth daily.     No current facility-administered medications for this visit.     Allergies:   Nsaids and Penicillins    Social History:  The patient  reports that she has been smoking Cigarettes.  She has a 20.00 pack-year smoking history. She has never used smokeless tobacco. She reports that she drinks alcohol. She reports that she does not use drugs.   Family History:  The patient's family history includes Cancer in her mother; Colon polyps in her brother, brother, brother, brother, brother, and brother; Diabetes in her father; Heart attack in her father; Hypertension in her mother; Macular degeneration in her mother.    ROS:  Please see the history of present illness.    Review of Systems: Constitutional:  denies fever, chills, diaphoresis, appetite change and fatigue.  HEENT: denies photophobia, eye pain, redness, hearing loss, ear pain, congestion, sore throat, rhinorrhea, sneezing, neck pain, neck stiffness and tinnitus.  Respiratory: denies SOB, DOE, cough, chest tightness, and wheezing.  Cardiovascular: denies chest pain, palpitations and leg swelling.  Gastrointestinal: denies nausea, vomiting, abdominal pain, diarrhea, constipation, blood in stool.  Genitourinary: denies dysuria, urgency, frequency, hematuria, flank pain and difficulty urinating.  Musculoskeletal: admits to   Leg pain and burning with walking   Skin: admits to  Red scaley rash on lower legs  Neurological: denies dizziness, seizures, syncope, weakness, light-headedness, numbness and headaches.     Hematological: denies adenopathy, easy bruising, personal or family bleeding history.  Psychiatric/ Behavioral: denies suicidal ideation, mood changes, confusion, nervousness, sleep disturbance and agitation.       All other systems are reviewed and negative.    PHYSICAL EXAM: VS:  BP 130/72   Pulse 90   Ht 5\' 6"  (1.676 m)   Wt 224 lb 12.8 oz (102 kg)   SpO2 98%   BMI 36.28 kg/m  , BMI Body mass index is 36.28 kg/m. GEN:  Obese  HEENT: normal  Neck: no JVD, carotid bruits, or masses Cardiac: irreg. Irreg. ; no murmurs, rubs, or gallops,no edema  Respiratory:  clear to auscultation bilaterally, normal  work of breathing GI: soft, nontender, nondistended, + BS MS: trace - 1 + leg edema,  scaley rash on lower ext.  Poor distal leg pulses  Skin: warm and dry, no rash Neuro:  Strength and sensation are intact Psych: normal  EKG:      Recent Labs: 05/16/2016: BUN 11; Creatinine, Ser 1.09; Hemoglobin 7.8; Platelets 379; Potassium 4.1; Sodium 136    Lipid Panel No results found for: CHOL, TRIG, HDL, CHOLHDL, VLDL, LDLCALC, LDLDIRECT    Wt Readings from Last 3 Encounters:  06/10/16 224 lb 12.8 oz (102 kg)  05/14/16 221 lb (100.2 kg)  04/09/16 221 lb 9.6 oz (100.5 kg)      Other studies Reviewed: Additional studies/ records that were reviewed today include: . Review of the above records demonstrates:    ASSESSMENT AND PLAN:  1.   Atrial fib :  Persistent .    Developed a GI bleed because of erosive gastrotis and Eliquis Has been give the OK to resume Eliquis by Dr. Oletta Lamas and Dr. Rex Kras.  I'm hoping that she will be able to tolerate full dose Eliquis once her gastritis resolves. I've Encouraged her to stop smoking, avoid indomethacin and Goody's powders. She needs to continue the medications prescribed by Dr. Oletta Lamas to prevent gastritis. I will see her in 4-6 weeks. If she has been able to tolerate full dose Eliquis  at that time, we will consider sending her for  elective cardioversion.  2. Essential hypertension: We discussed weight loss and salt restricted. She also needs to quit smoking. BP is well controlled today    3. Claudication:   She needs to return to Dr. Tyrell Antonio  Office. Advised her to stop smoking   Will see her in 4-6 weeks  for follow up visit  4. Anemia:   Has improved.   Current medicines are reviewed at length with the patient today.  The patient does not have concerns regarding medicines.  Labs/ tests ordered today include:  No orders of the defined types were placed in this encounter.    Mertie Moores, MD  06/10/2016 8:08 AM    [facility] [place], [place], [place]  [address] Phone: [phone]; Fax: [phone]

## 2016-06-21 ENCOUNTER — Other Ambulatory Visit: Payer: Self-pay | Admitting: Family Medicine

## 2016-06-21 DIAGNOSIS — Z1231 Encounter for screening mammogram for malignant neoplasm of breast: Secondary | ICD-10-CM

## 2016-07-09 ENCOUNTER — Ambulatory Visit
Admission: RE | Admit: 2016-07-09 | Discharge: 2016-07-09 | Disposition: A | Discharge status: Home / Self Care | Payer: 59 | Source: Ambulatory Visit | Attending: Family Medicine | Admitting: Family Medicine

## 2016-07-09 DIAGNOSIS — Z1231 Encounter for screening mammogram for malignant neoplasm of breast: Secondary | ICD-10-CM

## 2016-07-25 ENCOUNTER — Encounter: Payer: Self-pay | Admitting: Cardiovascular Disease

## 2016-07-25 ENCOUNTER — Encounter: Payer: Self-pay | Admitting: Nurse Practitioner

## 2016-07-25 ENCOUNTER — Ambulatory Visit (INDEPENDENT_AMBULATORY_CARE_PROVIDER_SITE_OTHER): Payer: 59 | Admitting: Cardiovascular Disease

## 2016-07-25 VITALS — BP 160/72 | HR 88 | Ht 66.0 in | Wt 214.4 lb

## 2016-07-25 DIAGNOSIS — I4819 Other persistent atrial fibrillation: Secondary | ICD-10-CM

## 2016-07-25 DIAGNOSIS — I10 Essential (primary) hypertension: Secondary | ICD-10-CM | POA: Diagnosis not present

## 2016-07-25 DIAGNOSIS — I481 Persistent atrial fibrillation: Secondary | ICD-10-CM | POA: Diagnosis not present

## 2016-07-25 MED ORDER — QUINAPRIL HCL 40 MG PO TABS: 40.0000 mg | ORAL_TABLET | Freq: Two times a day (BID) | ORAL | 11 refills | Status: DC

## 2016-07-25 NOTE — Progress Notes (Signed)
Cardiology Office Note   Date:  07/25/2016   ID:  Paula Wheeler, DOB 04-Apr-1951, MRN [id number]  PCP:  Gennette Pac, MD  Cardiologist:   Mertie Moores, MD   Chief Complaint  Patient presents with  . Follow-up    HTN, PVD   Problem list 1. Essential HTN  2. Hyperlipidemia 3. Cardiomegaly on x-ray 4. Diabetes mellitus 5. Obesity  6.      Nov. 13, 2017: Paula Wheeler is a 66 y.o. female who presents for evaluation of cardiomegaly She requested a CXR to look for lung issues ( is a current smoker) was found have cardiac megaly.  Her main complaint is that her legs give out when she is walking. She does not get any regular exercise.  No CP.   Some shortness of breath with exertion  Has DM,   She is getting better control of her glucose levels recently .    She admits that she eats more salt that she probably should. She's canned soup was on occasions. She put salt on her apples.  Feb. 12, 2018:  Pt was seen by Dr. Fletcher Anon for PVD. Was found to have atrial fib.   CHADS2VASC score is 82 ( female, age 52, HTN, PVD)  Started on Eliquis. Developed a GI bleed.  EGD shows erosive gastropathy.   She was told to stop her Goodies powders and Indomethicin ( which she was taking for gout )  She has seen Dr. Oletta Lamas -  She decided to reduce her Eliquis to 5 mg a day .    July 25, 2016:  Pt is seen back for follow Up of her atrial fibrillation. She was started on L course but she developed a GI bleed. She had been taking lots of goodies powders and indomethacin for gout.  July 25, 2016:  No symptoms related to atrial fib. Still smoking.  Her hands are cold at night  BP is up today .  Her Quinipril was reduced to once a day when she was in the hospital in jan. 2018.     Past Medical History:  Diagnosis Date  . Abnormal urine findings   . Abnormal vaginal Pap smear   . Allergic rhinitis   . Anemia   . Cardiomegaly   . COPD (chronic obstructive pulmonary  disease) ([place])   . Dyslipidemia   . Dyspnea   . Encounter for screening for malignant neoplasm of cervix   . Exogenous obesity   . GERD (gastroesophageal reflux disease)   . GI bleed 05/14/2016  . H/O: gout   . History of abnormal cells from cervix   . History of blood transfusion 05/14/2016   "got my 1st today" (05/14/2016)  . Hyperlipidemia   . Hypertension   . Iron deficiency anemia   . Microalbuminuria   . Peripheral edema   . Symptomatic anemia 05/14/2016  . Tobacco dependence   . Tobacco use   . Type II diabetes mellitus ([place])   . Wax in ear     Past Surgical History:  Procedure Laterality Date  . DIAGNOSTIC MAMMOGRAM  05/2015  . ESOPHAGOGASTRODUODENOSCOPY N/A 05/15/2016   Procedure: ESOPHAGOGASTRODUODENOSCOPY (EGD);  Surgeon: Wonda Horner, MD;  Location: [facility] ENDOSCOPY;  Service: Endoscopy;  Laterality: N/A;  . [place]   "I think they left my appendix"  . PAP SMEAR  2012 AND 2017     Current Outpatient Prescriptions  Medication Sig Dispense Refill  . albuterol (PROVENTIL HFA;VENTOLIN HFA) 108 (  90 Base) MCG/ACT inhaler Inhale 2 puffs into the lungs every 4 (four) hours as needed for wheezing or shortness of breath.    . allopurinol (ZYLOPRIM) 100 MG tablet Take 100 mg by mouth daily.    Marland Kitchen amLODipine (NORVASC) 5 MG tablet Take 1 tablet (5 mg total) by mouth daily. 90 tablet 3  . apixaban (ELIQUIS) 5 MG TABS tablet Take 1 tablet (5 mg total) by mouth 2 (two) times daily. 60 tablet 11  . atorvastatin (LIPITOR) 80 MG tablet Take 0.5 tablets (40 mg total) by mouth daily. 90 tablet 3  . Cholecalciferol (VITAMIN D3) 50000 units TABS Take 50,000 Units by mouth every 7 (seven) days. Tuesday    . Cyanocobalamin (VITAMIN B-12 IJ) Inject as directed every 14 (fourteen) days. As directed    . ferrous sulfate 325 (65 FE) MG tablet Take 325 mg by mouth daily.    Marland Kitchen HYDROcodone-acetaminophen (NORCO/VICODIN) 5-325 MG tablet Take 1 tablet by mouth every 6 (six) hours  as needed for moderate pain.    Marland Kitchen insulin NPH-regular Human (NOVOLIN 70/30) (70-30) 100 UNIT/ML injection Inject into the skin. INJECT 35 UNITS IN MORNINGS AND 60 UNITS IN EVENINGS    . Insulin Syringe-Needle U-100 (B-D INS SYR ULTRAFINE 1CC/31G) 31G X 5/16" 1 ML MISC by Does not apply route.    . loratadine (CLARITIN) 10 MG tablet Take 10 mg by mouth daily.    . metFORMIN (GLUCOPHAGE) 500 MG tablet TAKE 2 TABLETS BY MOUTH EVERY MORNING AND 3 TABLETS BY MOUTH EVERY EVENING    . pantoprazole (PROTONIX) 40 MG tablet Take 1 tablet (40 mg total) by mouth daily. 60 tablet 0  . quinapril (ACCUPRIL) 40 MG tablet Take 40 mg by mouth daily.    . TRIAMCINOLONE ACETONIDE EX Apply 1 application topically every morning.     . triamterene-hydrochlorothiazide (MAXZIDE) 75-50 MG tablet Take 1 tablet by mouth daily.     No current facility-administered medications for this visit.     Allergies:   Nsaids and Penicillins    Social History:  The patient  reports that she has been smoking Cigarettes.  She has a 20.00 pack-year smoking history. She has never used smokeless tobacco. She reports that she drinks alcohol. She reports that she does not use drugs.   Family History:  The patient's family history includes Cancer in her mother; Colon polyps in her brother, brother, brother, brother, brother, and brother; Diabetes in her father; Heart attack in her father; Hypertension in her mother; Macular degeneration in her mother.    ROS:  Please see the history of present illness.    Review of Systems: Constitutional:  denies fever, chills, diaphoresis, appetite change and fatigue.  HEENT: denies photophobia, eye pain, redness, hearing loss, ear pain, congestion, sore throat, rhinorrhea, sneezing, neck pain, neck stiffness and tinnitus.  Respiratory: denies SOB, DOE, cough, chest tightness, and wheezing.  Cardiovascular: denies chest pain, palpitations and leg swelling.  Gastrointestinal: denies nausea, vomiting,  abdominal pain, diarrhea, constipation, blood in stool.  Genitourinary: denies dysuria, urgency, frequency, hematuria, flank pain and difficulty urinating.  Musculoskeletal: admits to   Leg pain and burning with walking   Skin: admits to  Red scaley rash on lower legs  Neurological: denies dizziness, seizures, syncope, weakness, light-headedness, numbness and headaches.   Hematological: denies adenopathy, easy bruising, personal or family bleeding history.  Psychiatric/ Behavioral: denies suicidal ideation, mood changes, confusion, nervousness, sleep disturbance and agitation.       All other systems are  reviewed and negative.    PHYSICAL EXAM: VS:  BP (!) 160/72 (BP Location: Left Arm, Patient Position: Sitting, Cuff Size: Normal)   Pulse 88   Ht 5\' 6"  (1.676 m)   Wt 214 lb 6.4 oz (97.3 kg)   BMI 34.61 kg/m  , BMI Body mass index is 34.61 kg/m. GEN:  Obese  HEENT: normal  Neck: no JVD, carotid bruits, or masses Cardiac: irreg. Irreg. ; no murmurs, rubs, or gallops,no edema  Respiratory:  clear to auscultation bilaterally, normal work of breathing GI: soft, nontender, nondistended, + BS MS: trace - 1 + leg edema,  scaley rash on lower ext.  Poor distal leg pulses  Skin: warm and dry, no rash Neuro:  Strength and sensation are intact Psych: normal  EKG:    July 25, 2016: Atrial fib with HR of 88.  NS ST abn.   Recent Labs: 05/16/2016: BUN 11; Creatinine, Ser 1.09; Hemoglobin 7.8; Platelets 379; Potassium 4.1; Sodium 136    Lipid Panel No results found for: CHOL, TRIG, HDL, CHOLHDL, VLDL, LDLCALC, LDLDIRECT    Wt Readings from Last 3 Encounters:  07/25/16 214 lb 6.4 oz (97.3 kg)  06/10/16 224 lb 12.8 oz (102 kg)  05/14/16 221 lb (100.2 kg)      Other studies Reviewed: Additional studies/ records that were reviewed today include: . Review of the above records demonstrates:    ASSESSMENT AND PLAN:  1.   Atrial fib :  Persistent .    Is basically asymptomatic.    Will schedule her for a cardioversion in several weeks  Continue Eliquis 5 mg BID .  2. Essential hypertension: We discussed weight loss and salt restricted. She also needs to quit smoking. BP is is elevated.   Increase Quinipril to 40 mg PO BID   Will check BMP in several weeks.   3. Claudication:   She needs to return to Dr. Tyrell Antonio  Office. Advised her to stop smoking   4. Anemia:   Has improved.   Current medicines are reviewed at length with the patient today.  The patient does not have concerns regarding medicines.  Labs/ tests ordered today include:  No orders of the defined types were placed in this encounter.    Mertie Moores, MD  07/25/2016 8:58 AM    [facility], [place], [place]  [address] Phone: [phone]; Fax: [phone]

## 2016-07-25 NOTE — Patient Instructions (Signed)
Medication Instructions:  INCREASE Quinapril to 40 mg twice daily   Labwork: Your physician recommends that you return for lab work in: 1 week before your Cardioversion   Testing/Procedures: Your physician has recommended that you have a Cardioversion (DCCV). Electrical Cardioversion uses a jolt of electricity to your heart either through paddles or wired patches attached to your chest. This is a controlled, usually prescheduled, procedure. Defibrillation is done under light anesthesia in the hospital, and you usually go home the day of the procedure. This is done to get your heart back into a normal rhythm. You are not awake for the procedure. Please see the instruction sheet given to you today.    Follow-Up: Your physician wants you to follow-up in: 3 months with Dr. Acie Fredrickson.  You will receive a reminder letter in the mail two months in advance. If you don't receive a letter, please call our office to schedule the follow-up appointment.   If you need a refill on your cardiac medications before your next appointment, please call your pharmacy.   Thank you for choosing [facility]! Christen Bame, RN [phone]

## 2016-08-02 ENCOUNTER — Other Ambulatory Visit: Payer: 59 | Admitting: *Deleted

## 2016-08-02 DIAGNOSIS — I4819 Other persistent atrial fibrillation: Secondary | ICD-10-CM

## 2016-08-02 DIAGNOSIS — I10 Essential (primary) hypertension: Secondary | ICD-10-CM

## 2016-08-02 LAB — BASIC METABOLIC PANEL
BUN/Creatinine Ratio: 11 — ABNORMAL LOW (ref 12–28)
BUN: 19 mg/dL (ref 8–27)
CALCIUM: 9.5 mg/dL (ref 8.7–10.3)
CHLORIDE: 105 mmol/L (ref 96–106)
CO2: 20 mmol/L (ref 18–29)
Creatinine, Ser: 1.74 mg/dL — ABNORMAL HIGH (ref 0.57–1.00)
GFR calc non Af Amer: 30 mL/min/{1.73_m2} — ABNORMAL LOW (ref >59)
GFR, EST AFRICAN AMERICAN: 35 mL/min/{1.73_m2} — AB (ref >59)
Glucose: 141 mg/dL — ABNORMAL HIGH (ref 65–99)
POTASSIUM: 5.2 mmol/L (ref 3.5–5.2)
Sodium: 140 mmol/L (ref 134–144)

## 2016-08-02 LAB — CBC
HEMOGLOBIN: 11.6 g/dL (ref 11.1–15.9)
Hematocrit: 34.2 % (ref 34.0–46.6)
MCH: 29.4 pg (ref 26.6–33.0)
MCHC: 33.9 g/dL (ref 31.5–35.7)
MCV: 87 fL (ref 79–97)
Platelets: 264 10*3/uL (ref 150–379)
RBC: 3.95 x10E6/uL (ref 3.77–5.28)
RDW: 17.7 % — ABNORMAL HIGH (ref 12.3–15.4)
WBC: 8.7 10*3/uL (ref 3.4–10.8)

## 2016-08-05 ENCOUNTER — Telehealth: Payer: Self-pay | Admitting: Cardiovascular Disease

## 2016-08-05 ENCOUNTER — Other Ambulatory Visit: Payer: 59

## 2016-08-05 DIAGNOSIS — I10 Essential (primary) hypertension: Secondary | ICD-10-CM

## 2016-08-05 DIAGNOSIS — I4819 Other persistent atrial fibrillation: Secondary | ICD-10-CM

## 2016-08-05 MED ORDER — QUINAPRIL HCL 40 MG PO TABS: 40.0000 mg | ORAL_TABLET | Freq: Every day | ORAL | 3 refills | Status: DC

## 2016-08-05 NOTE — Telephone Encounter (Signed)
-----   Message from Thayer Headings, MD sent at 08/04/2016  8:35 PM EDT ----- By my last note, we increased the Quinipril to 40 mg BID.   Her creatinine is up to 1.7 ( from 1.09) Please decrease the Quinipril to 40 mg a day and recheck BMP in 2-3 weeks  Please have her record her BP for the next several weeks

## 2016-08-05 NOTE — Telephone Encounter (Signed)
Reviewed Dr. Elmarie Shiley advice with patient who verbalized understanding and agreement. She states she will monitor BP and will call back if it is elevated. She reports being nervous about the cardioversion on Wed. I reviewed the procedure and answered her questions to her satisfaction. She is scheduled for repeat lab on 4/23. She thanked me for the call.

## 2016-08-05 NOTE — Telephone Encounter (Signed)
New message      Pt is scheduled for an procedure on wed.  Calling to get lab results and to make sure procedure is still set for wed.  Please call

## 2016-08-05 NOTE — Telephone Encounter (Signed)
Left message for patient to call back for medication instructions in preparation for DCCV on  Wednesday.

## 2016-08-06 ENCOUNTER — Other Ambulatory Visit: Payer: Self-pay | Admitting: Cardiovascular Disease

## 2016-08-06 ENCOUNTER — Telehealth: Payer: Self-pay | Admitting: Cardiovascular Disease

## 2016-08-06 NOTE — Telephone Encounter (Signed)
Follow up     Pt called again regarding her procedure for tomorrow please call

## 2016-08-06 NOTE — Telephone Encounter (Signed)
Spoke with patient who states her BP yesterday @ 1715 was 155/69 mmHg and this morning @ 0815 it was  118/69 mmHg. She asks if she can proceed with DCCV tomorrow and I confirmed. She thanked me for the call.

## 2016-08-06 NOTE — Telephone Encounter (Signed)
New Message    Pt c/o BP issue: STAT if pt c/o blurred vision, one-sided weakness or slurred speech  1. What are your last 5 BP readings? 155/69 yesterday , this morning 815a 118/69    Should she keep the procedure scheduled for tomorrow

## 2016-08-06 NOTE — Telephone Encounter (Signed)
Follow Up   Pt calling because she still hadn't heard anything from nurse. Requesting call back

## 2016-08-07 ENCOUNTER — Encounter (HOSPITAL_COMMUNITY): Payer: Self-pay | Admitting: *Deleted

## 2016-08-07 ENCOUNTER — Encounter (HOSPITAL_COMMUNITY)
Admission: RE | Disposition: A | Discharge status: Home / Self Care | Payer: Self-pay | Source: Ambulatory Visit | Attending: Cardiovascular Disease

## 2016-08-07 ENCOUNTER — Ambulatory Visit (HOSPITAL_COMMUNITY): Payer: 59 | Admitting: Certified Registered Nurse Anesthetist

## 2016-08-07 ENCOUNTER — Ambulatory Visit (HOSPITAL_COMMUNITY)
Admission: RE | Admit: 2016-08-07 | Discharge: 2016-08-07 | Disposition: A | Discharge status: Home / Self Care | Payer: 59 | Source: Ambulatory Visit | Attending: Cardiovascular Disease | Admitting: Cardiovascular Disease

## 2016-08-07 DIAGNOSIS — Z6834 Body mass index (BMI) 34.0-34.9, adult: Secondary | ICD-10-CM | POA: Diagnosis not present

## 2016-08-07 DIAGNOSIS — J449 Chronic obstructive pulmonary disease, unspecified: Secondary | ICD-10-CM | POA: Insufficient documentation

## 2016-08-07 DIAGNOSIS — D509 Iron deficiency anemia, unspecified: Secondary | ICD-10-CM | POA: Insufficient documentation

## 2016-08-07 DIAGNOSIS — E1151 Type 2 diabetes mellitus with diabetic peripheral angiopathy without gangrene: Secondary | ICD-10-CM | POA: Diagnosis not present

## 2016-08-07 DIAGNOSIS — M109 Gout, unspecified: Secondary | ICD-10-CM | POA: Insufficient documentation

## 2016-08-07 DIAGNOSIS — Z8249 Family history of ischemic heart disease and other diseases of the circulatory system: Secondary | ICD-10-CM | POA: Insufficient documentation

## 2016-08-07 DIAGNOSIS — I10 Essential (primary) hypertension: Secondary | ICD-10-CM | POA: Diagnosis not present

## 2016-08-07 DIAGNOSIS — Z7901 Long term (current) use of anticoagulants: Secondary | ICD-10-CM | POA: Diagnosis not present

## 2016-08-07 DIAGNOSIS — I4891 Unspecified atrial fibrillation: Secondary | ICD-10-CM | POA: Diagnosis present

## 2016-08-07 DIAGNOSIS — Z794 Long term (current) use of insulin: Secondary | ICD-10-CM | POA: Insufficient documentation

## 2016-08-07 DIAGNOSIS — K219 Gastro-esophageal reflux disease without esophagitis: Secondary | ICD-10-CM | POA: Diagnosis not present

## 2016-08-07 DIAGNOSIS — E669 Obesity, unspecified: Secondary | ICD-10-CM | POA: Insufficient documentation

## 2016-08-07 DIAGNOSIS — E785 Hyperlipidemia, unspecified: Secondary | ICD-10-CM | POA: Diagnosis not present

## 2016-08-07 DIAGNOSIS — I481 Persistent atrial fibrillation: Secondary | ICD-10-CM | POA: Diagnosis not present

## 2016-08-07 DIAGNOSIS — K319 Disease of stomach and duodenum, unspecified: Secondary | ICD-10-CM | POA: Diagnosis not present

## 2016-08-07 DIAGNOSIS — F17210 Nicotine dependence, cigarettes, uncomplicated: Secondary | ICD-10-CM | POA: Diagnosis not present

## 2016-08-07 DIAGNOSIS — Z880 Allergy status to penicillin: Secondary | ICD-10-CM | POA: Diagnosis not present

## 2016-08-07 HISTORY — PX: CARDIOVERSION: SHX1299

## 2016-08-07 LAB — GLUCOSE, CAPILLARY: GLUCOSE-CAPILLARY: 91 mg/dL (ref 65–99)

## 2016-08-07 SURGERY — CARDIOVERSION
Anesthesia: General

## 2016-08-07 MED ORDER — LIDOCAINE 2% (20 MG/ML) 5 ML SYRINGE
INTRAMUSCULAR | Status: DC | PRN
  Administered 2016-08-07: 100 mg via INTRAVENOUS

## 2016-08-07 MED ORDER — SODIUM CHLORIDE 0.9 % IV SOLN
INTRAVENOUS | Status: DC
  Administered 2016-08-07: 13:00:00 via INTRAVENOUS

## 2016-08-07 MED ORDER — PROPOFOL 10 MG/ML IV BOLUS
INTRAVENOUS | Status: DC | PRN
  Administered 2016-08-07: 80 mg via INTRAVENOUS

## 2016-08-07 NOTE — Transfer of Care (Signed)
Immediate Anesthesia Transfer of Care Note  Patient: Paula Wheeler  Procedure(s) Performed: Procedure(s): CARDIOVERSION (N/A)  Patient Location: Endoscopy Unit  Anesthesia Type:General  Level of Consciousness: awake, alert  and oriented  Airway & Oxygen Therapy: Patient Spontanous Breathing and Patient connected to nasal cannula oxygen  Post-op Assessment: Report given to RN and Post -op Vital signs reviewed and stable  Post vital signs: Reviewed and stable  Last Vitals:  Vitals:   08/07/16 1352 08/07/16 1353  BP: (!) 131/45   Pulse: 78 76  Resp: (!) 29 (!) 26  Temp:      Last Pain:  Vitals:   08/07/16 1301  TempSrc: Oral         Complications: No apparent anesthesia complications

## 2016-08-07 NOTE — Discharge Instructions (Signed)
Electrical Cardioversion, Care After °This sheet gives you information about how to care for yourself after your procedure. Your health care provider may also give you more specific instructions. If you have problems or questions, contact your health care provider. °What can I expect after the procedure? °After the procedure, it is common to have: °· Some redness on the skin where the shocks were given. °Follow these instructions at home: °· Do not drive for 24 hours if you were given a medicine to help you relax (sedative). °· Take over-the-counter and prescription medicines only as told by your health care provider. °· Ask your health care provider how to check your pulse. Check it often. °· Rest for 48 hours after the procedure or as told by your health care provider. °· Avoid or limit your caffeine use as told by your health care provider. °Contact a health care provider if: °· You feel like your heart is beating too quickly or your pulse is not regular. °· You have a serious muscle cramp that does not go away. °Get help right away if: °· You have discomfort in your chest. °· You are dizzy or you feel faint. °· You have trouble breathing or you are short of breath. °· Your speech is slurred. °· You have trouble moving an arm or leg on one side of your body. °· Your fingers or toes turn cold or blue. °This information is not intended to replace advice given to you by your health care provider. Make sure you discuss any questions you have with your health care provider. °Document Released: 02/03/2013 Document Revised: 11/17/2015 Document Reviewed: 10/20/2015 °Elsevier Interactive Patient Education © 2017 [employer] ° °

## 2016-08-07 NOTE — Anesthesia Postprocedure Evaluation (Addendum)
Anesthesia Post Note  Patient: Paula Wheeler  Procedure(s) Performed: Procedure(s) (LRB): CARDIOVERSION (N/A)  Patient location during evaluation: PACU Anesthesia Type: General Level of consciousness: awake and alert Pain management: pain level controlled Vital Signs Assessment: post-procedure vital signs reviewed and stable Respiratory status: spontaneous breathing, nonlabored ventilation, respiratory function stable and patient connected to nasal cannula oxygen Cardiovascular status: blood pressure returned to baseline and stable Postop Assessment: no signs of nausea or vomiting Anesthetic complications: no       Last Vitals:  Vitals:   08/07/16 1400 08/07/16 1410  BP: 124/68 (!) 152/72  Pulse:  73  Resp: 15 20  Temp:      Last Pain:  Vitals:   08/07/16 1301  TempSrc: Oral                 Kentravious Lipford DAVID

## 2016-08-07 NOTE — Anesthesia Preprocedure Evaluation (Signed)
Anesthesia Evaluation  Patient identified by MRN, date of birth, ID band Patient awake    Reviewed: Allergy & Precautions, NPO status , Patient's Chart, lab work & pertinent test results  Airway Mallampati: II  TM Distance: >3 FB Neck ROM: Full    Dental  (+) Dental Advisory Given, Edentulous Upper, Edentulous Lower   Pulmonary shortness of breath and with exertion, COPD, Current Smoker,    breath sounds clear to auscultation       Cardiovascular hypertension, Pt. on medications + dysrhythmias Atrial Fibrillation  Rhythm:Regular     Neuro/Psych    GI/Hepatic GERD  Medicated,  Endo/Other  diabetes, Type 2  Renal/GU Renal disease     Musculoskeletal   Abdominal   Peds  Hematology  (+) anemia ,   Anesthesia Other Findings   Reproductive/Obstetrics                          Anesthesia Physical Anesthesia Plan  ASA: III  Anesthesia Plan: MAC   Post-op Pain Management:    Induction: Intravenous  Airway Management Planned: Natural Airway and Simple Face Mask  Additional Equipment:   Intra-op Plan:   Post-operative Plan:   Informed Consent: I have reviewed the patients History and Physical, chart, labs and discussed the procedure including the risks, benefits and alternatives for the proposed anesthesia with the patient or authorized representative who has indicated his/her understanding and acceptance.   Dental advisory given  Plan Discussed with: CRNA, Anesthesiologist and Surgeon  Anesthesia Plan Comments:         Anesthesia Quick Evaluation  

## 2016-08-07 NOTE — H&P (View-Only) (Signed)
Cardiology Office Note   Date:  07/25/2016   ID:  Paula Wheeler, DOB 09-06-1950, MRN [id number]  PCP:  Gennette Pac, MD  Cardiologist:   Mertie Moores, MD   Chief Complaint  Patient presents with  . Follow-up    HTN, PVD   Problem list 1. Essential HTN  2. Hyperlipidemia 3. Cardiomegaly on x-ray 4. Diabetes mellitus 5. Obesity  6.      Nov. 13, 2017: Paula Wheeler is a 65 y.o. female who presents for evaluation of cardiomegaly She requested a CXR to look for lung issues ( is a current smoker) was found have cardiac megaly.  Her main complaint is that her legs give out when she is walking. She does not get any regular exercise.  No CP.   Some shortness of breath with exertion  Has DM,   She is getting better control of her glucose levels recently .    She admits that she eats more salt that she probably should. She's canned soup was on occasions. She put salt on her apples.  Feb. 12, 2018:  Pt was seen by Dr. Fletcher Anon for PVD. Was found to have atrial fib.   CHADS2VASC score is 14 ( female, age 71, HTN, PVD)  Started on Eliquis. Developed a GI bleed.  EGD shows erosive gastropathy.   She was told to stop her Goodies powders and Indomethicin ( which she was taking for gout )  She has seen Dr. Oletta Lamas -  She decided to reduce her Eliquis to 5 mg a day .    July 25, 2016:  Pt is seen back for follow Up of her atrial fibrillation. She was started on L course but she developed a GI bleed. She had been taking lots of goodies powders and indomethacin for gout.  July 25, 2016:  No symptoms related to atrial fib. Still smoking.  Her hands are cold at night  BP is up today .  Her Quinipril was reduced to once a day when she was in the hospital in jan. 2018.     Past Medical History:  Diagnosis Date  . Abnormal urine findings   . Abnormal vaginal Pap smear   . Allergic rhinitis   . Anemia   . Cardiomegaly   . COPD (chronic obstructive pulmonary  disease) ([place])   . Dyslipidemia   . Dyspnea   . Encounter for screening for malignant neoplasm of cervix   . Exogenous obesity   . GERD (gastroesophageal reflux disease)   . GI bleed 05/14/2016  . H/O: gout   . History of abnormal cells from cervix   . History of blood transfusion 05/14/2016   "got my 1st today" (05/14/2016)  . Hyperlipidemia   . Hypertension   . Iron deficiency anemia   . Microalbuminuria   . Peripheral edema   . Symptomatic anemia 05/14/2016  . Tobacco dependence   . Tobacco use   . Type II diabetes mellitus ([place])   . Wax in ear     Past Surgical History:  Procedure Laterality Date  . DIAGNOSTIC MAMMOGRAM  05/2015  . ESOPHAGOGASTRODUODENOSCOPY N/A 05/15/2016   Procedure: ESOPHAGOGASTRODUODENOSCOPY (EGD);  Surgeon: Wonda Horner, MD;  Location: [facility] ENDOSCOPY;  Service: Endoscopy;  Laterality: N/A;  . [place]   "I think they left my appendix"  . PAP SMEAR  2012 AND 2017     Current Outpatient Prescriptions  Medication Sig Dispense Refill  . albuterol (PROVENTIL HFA;VENTOLIN HFA) 108 (  90 Base) MCG/ACT inhaler Inhale 2 puffs into the lungs every 4 (four) hours as needed for wheezing or shortness of breath.    . allopurinol (ZYLOPRIM) 100 MG tablet Take 100 mg by mouth daily.    Marland Kitchen amLODipine (NORVASC) 5 MG tablet Take 1 tablet (5 mg total) by mouth daily. 90 tablet 3  . apixaban (ELIQUIS) 5 MG TABS tablet Take 1 tablet (5 mg total) by mouth 2 (two) times daily. 60 tablet 11  . atorvastatin (LIPITOR) 80 MG tablet Take 0.5 tablets (40 mg total) by mouth daily. 90 tablet 3  . Cholecalciferol (VITAMIN D3) 50000 units TABS Take 50,000 Units by mouth every 7 (seven) days. Tuesday    . Cyanocobalamin (VITAMIN B-12 IJ) Inject as directed every 14 (fourteen) days. As directed    . ferrous sulfate 325 (65 FE) MG tablet Take 325 mg by mouth daily.    Marland Kitchen HYDROcodone-acetaminophen (NORCO/VICODIN) 5-325 MG tablet Take 1 tablet by mouth every 6 (six) hours  as needed for moderate pain.    Marland Kitchen insulin NPH-regular Human (NOVOLIN 70/30) (70-30) 100 UNIT/ML injection Inject into the skin. INJECT 35 UNITS IN MORNINGS AND 60 UNITS IN EVENINGS    . Insulin Syringe-Needle U-100 (B-D INS SYR ULTRAFINE 1CC/31G) 31G X 5/16" 1 ML MISC by Does not apply route.    . loratadine (CLARITIN) 10 MG tablet Take 10 mg by mouth daily.    . metFORMIN (GLUCOPHAGE) 500 MG tablet TAKE 2 TABLETS BY MOUTH EVERY MORNING AND 3 TABLETS BY MOUTH EVERY EVENING    . pantoprazole (PROTONIX) 40 MG tablet Take 1 tablet (40 mg total) by mouth daily. 60 tablet 0  . quinapril (ACCUPRIL) 40 MG tablet Take 40 mg by mouth daily.    . TRIAMCINOLONE ACETONIDE EX Apply 1 application topically every morning.     . triamterene-hydrochlorothiazide (MAXZIDE) 75-50 MG tablet Take 1 tablet by mouth daily.     No current facility-administered medications for this visit.     Allergies:   Nsaids and Penicillins    Social History:  The patient  reports that she has been smoking Cigarettes.  She has a 20.00 pack-year smoking history. She has never used smokeless tobacco. She reports that she drinks alcohol. She reports that she does not use drugs.   Family History:  The patient's family history includes Cancer in her mother; Colon polyps in her brother, brother, brother, brother, brother, and brother; Diabetes in her father; Heart attack in her father; Hypertension in her mother; Macular degeneration in her mother.    ROS:  Please see the history of present illness.    Review of Systems: Constitutional:  denies fever, chills, diaphoresis, appetite change and fatigue.  HEENT: denies photophobia, eye pain, redness, hearing loss, ear pain, congestion, sore throat, rhinorrhea, sneezing, neck pain, neck stiffness and tinnitus.  Respiratory: denies SOB, DOE, cough, chest tightness, and wheezing.  Cardiovascular: denies chest pain, palpitations and leg swelling.  Gastrointestinal: denies nausea, vomiting,  abdominal pain, diarrhea, constipation, blood in stool.  Genitourinary: denies dysuria, urgency, frequency, hematuria, flank pain and difficulty urinating.  Musculoskeletal: admits to   Leg pain and burning with walking   Skin: admits to  Red scaley rash on lower legs  Neurological: denies dizziness, seizures, syncope, weakness, light-headedness, numbness and headaches.   Hematological: denies adenopathy, easy bruising, personal or family bleeding history.  Psychiatric/ Behavioral: denies suicidal ideation, mood changes, confusion, nervousness, sleep disturbance and agitation.       All other systems are  reviewed and negative.    PHYSICAL EXAM: VS:  BP (!) 160/72 (BP Location: Left Arm, Patient Position: Sitting, Cuff Size: Normal)   Pulse 88   Ht 5\' 6"  (1.676 m)   Wt 214 lb 6.4 oz (97.3 kg)   BMI 34.61 kg/m  , BMI Body mass index is 34.61 kg/m. GEN:  Obese  HEENT: normal  Neck: no JVD, carotid bruits, or masses Cardiac: irreg. Irreg. ; no murmurs, rubs, or gallops,no edema  Respiratory:  clear to auscultation bilaterally, normal work of breathing GI: soft, nontender, nondistended, + BS MS: trace - 1 + leg edema,  scaley rash on lower ext.  Poor distal leg pulses  Skin: warm and dry, no rash Neuro:  Strength and sensation are intact Psych: normal  EKG:    July 25, 2016: Atrial fib with HR of 88.  NS ST abn.   Recent Labs: 05/16/2016: BUN 11; Creatinine, Ser 1.09; Hemoglobin 7.8; Platelets 379; Potassium 4.1; Sodium 136    Lipid Panel No results found for: CHOL, TRIG, HDL, CHOLHDL, VLDL, LDLCALC, LDLDIRECT    Wt Readings from Last 3 Encounters:  07/25/16 214 lb 6.4 oz (97.3 kg)  06/10/16 224 lb 12.8 oz (102 kg)  05/14/16 221 lb (100.2 kg)      Other studies Reviewed: Additional studies/ records that were reviewed today include: . Review of the above records demonstrates:    ASSESSMENT AND PLAN:  1.   Atrial fib :  Persistent .    Is basically asymptomatic.    Will schedule her for a cardioversion in several weeks  Continue Eliquis 5 mg BID .  2. Essential hypertension: We discussed weight loss and salt restricted. She also needs to quit smoking. BP is is elevated.   Increase Quinipril to 40 mg PO BID   Will check BMP in several weeks.   3. Claudication:   She needs to return to Dr. Tyrell Antonio  Office. Advised her to stop smoking   4. Anemia:   Has improved.   Current medicines are reviewed at length with the patient today.  The patient does not have concerns regarding medicines.  Labs/ tests ordered today include:  No orders of the defined types were placed in this encounter.    Mertie Moores, MD  07/25/2016 8:58 AM    [address], [place], [place]  [address] Phone: [phone]; Fax: [phone]

## 2016-08-07 NOTE — CV Procedure (Signed)
    Cardioversion Note  SHADAI MCCLANE [id number] [birth year]  Procedure: DC Cardioversion Indications: Atrial fib   Procedure Details Consent: Obtained Time Out: Verified patient identification, verified procedure, site/side was marked, verified correct patient position, special equipment/implants available, Radiology Safety Procedures followed,  medications/allergies/relevent history reviewed, required imaging and test results available.  Performed  The patient has been on adequate anticoagulation.  The patient received Lidocaine 100 mg IV followed by Propofol 80 mg IV  for sedation.  Synchronous cardioversion was performed at 120  joules.  The cardioversion was successful     Complications: No apparent complications Patient did tolerate procedure well.   Thayer Headings, Brooke Bonito., MD, [facility] 08/07/2016, 1:52 PM

## 2016-08-07 NOTE — Interval H&P Note (Signed)
History and Physical Interval Note:  08/07/2016 1:41 PM  Paula Wheeler  has presented today for surgery, with the diagnosis of PERSISTANT AFIB  The various methods of treatment have been discussed with the patient and family. After consideration of risks, benefits and other options for treatment, the patient has consented to  Procedure(s): CARDIOVERSION (N/A) as a surgical intervention .  The patient's history has been reviewed, patient examined, no change in status, stable for surgery.  I have reviewed the patient's chart and labs.  Questions were answered to the patient's satisfaction.     Mertie Moores

## 2016-08-08 NOTE — Addendum Note (Signed)
Addendum  created 08/08/16 1638 by Lillia Abed, MD   Sign clinical note

## 2016-08-09 ENCOUNTER — Encounter (HOSPITAL_COMMUNITY): Payer: Self-pay | Admitting: Cardiovascular Disease

## 2016-08-20 ENCOUNTER — Other Ambulatory Visit: Payer: 59 | Admitting: *Deleted

## 2016-08-20 DIAGNOSIS — I739 Peripheral vascular disease, unspecified: Secondary | ICD-10-CM

## 2016-08-20 DIAGNOSIS — I10 Essential (primary) hypertension: Secondary | ICD-10-CM

## 2016-08-20 DIAGNOSIS — I4819 Other persistent atrial fibrillation: Secondary | ICD-10-CM

## 2016-08-20 LAB — CBC WITH DIFFERENTIAL/PLATELET
Basophils Absolute: 0 10*3/uL (ref 0.0–0.2)
Basos: 1 %
EOS (ABSOLUTE): 0.2 10*3/uL (ref 0.0–0.4)
Eos: 3 %
Hematocrit: 36.7 % (ref 34.0–46.6)
Hemoglobin: 11.7 g/dL (ref 11.1–15.9)
Immature Grans (Abs): 0 10*3/uL (ref 0.0–0.1)
Immature Granulocytes: 0 %
Lymphocytes Absolute: 1.8 10*3/uL (ref 0.7–3.1)
Lymphs: 20 %
MCH: 28.4 pg (ref 26.6–33.0)
MCHC: 31.9 g/dL (ref 31.5–35.7)
MCV: 89 fL (ref 79–97)
Monocytes Absolute: 0.7 10*3/uL (ref 0.1–0.9)
Monocytes: 8 %
Neutrophils Absolute: 6.1 10*3/uL (ref 1.4–7.0)
Neutrophils: 68 %
Platelets: 290 10*3/uL (ref 150–379)
RBC: 4.12 x10E6/uL (ref 3.77–5.28)
RDW: 17.8 % — ABNORMAL HIGH (ref 12.3–15.4)
WBC: 8.8 10*3/uL (ref 3.4–10.8)

## 2016-08-20 LAB — BASIC METABOLIC PANEL
BUN/Creatinine Ratio: 11 — ABNORMAL LOW (ref 12–28)
BUN: 22 mg/dL (ref 8–27)
CALCIUM: 9.5 mg/dL (ref 8.7–10.3)
CO2: 17 mmol/L — AB (ref 18–29)
CREATININE: 1.92 mg/dL — AB (ref 0.57–1.00)
Chloride: 107 mmol/L — ABNORMAL HIGH (ref 96–106)
GFR calc Af Amer: 31 mL/min/{1.73_m2} — ABNORMAL LOW (ref >59)
GFR, EST NON AFRICAN AMERICAN: 27 mL/min/{1.73_m2} — AB (ref >59)
Glucose: 160 mg/dL — ABNORMAL HIGH (ref 65–99)
Potassium: 5.5 mmol/L — ABNORMAL HIGH (ref 3.5–5.2)
Sodium: 142 mmol/L (ref 134–144)

## 2016-08-21 ENCOUNTER — Telehealth: Payer: Self-pay | Admitting: Cardiovascular Disease

## 2016-08-21 DIAGNOSIS — I10 Essential (primary) hypertension: Secondary | ICD-10-CM

## 2016-08-21 DIAGNOSIS — R7989 Other specified abnormal findings of blood chemistry: Secondary | ICD-10-CM

## 2016-08-21 NOTE — Telephone Encounter (Signed)
Reviewed lab results and plan of care with patient. She states she eats a lot of bananas. I advised her to reduce intake of high potassium foods. She will d/c quinapril and is aware that she will receive a call to schedule renal artery duplex. I advised that we will schedule follow-up based on those results. I forwarded lab work to her PCP, Dr. Rex Kras, per her request. She thanked me for the call.

## 2016-08-21 NOTE — Telephone Encounter (Signed)
New message ° ° ° °Pt is calling back about lab results. °

## 2016-08-23 NOTE — Telephone Encounter (Signed)
Left message for patient to call back  

## 2016-08-23 NOTE — Telephone Encounter (Signed)
Follow up  Pt voiced having questions about kidney, test, and UTI.  I scheduled pt for 5.24.18 @ 8 am and provided patient prep instructions.  Please f/u

## 2016-09-11 ENCOUNTER — Other Ambulatory Visit: Payer: Self-pay | Admitting: Cardiovascular Disease

## 2016-09-11 DIAGNOSIS — I10 Essential (primary) hypertension: Secondary | ICD-10-CM

## 2016-09-16 ENCOUNTER — Other Ambulatory Visit: Payer: 59

## 2016-09-16 DIAGNOSIS — I10 Essential (primary) hypertension: Secondary | ICD-10-CM

## 2016-09-16 DIAGNOSIS — R7989 Other specified abnormal findings of blood chemistry: Secondary | ICD-10-CM

## 2016-09-16 LAB — BASIC METABOLIC PANEL
BUN/Creatinine Ratio: 11 — ABNORMAL LOW (ref 12–28)
BUN: 20 mg/dL (ref 8–27)
CALCIUM: 9.6 mg/dL (ref 8.7–10.3)
CO2: 18 mmol/L (ref 18–29)
CREATININE: 1.9 mg/dL — AB (ref 0.57–1.00)
Chloride: 106 mmol/L (ref 96–106)
GFR calc Af Amer: 31 mL/min/{1.73_m2} — ABNORMAL LOW (ref >59)
GFR, EST NON AFRICAN AMERICAN: 27 mL/min/{1.73_m2} — AB (ref >59)
Glucose: 127 mg/dL — ABNORMAL HIGH (ref 65–99)
Potassium: 5.5 mmol/L — ABNORMAL HIGH (ref 3.5–5.2)
SODIUM: 137 mmol/L (ref 134–144)

## 2016-09-19 ENCOUNTER — Ambulatory Visit (HOSPITAL_COMMUNITY)
Admission: RE | Admit: 2016-09-19 | Discharge: 2016-09-19 | Disposition: A | Discharge status: Home / Self Care | Payer: 59 | Source: Ambulatory Visit | Attending: Cardiology | Admitting: Cardiology

## 2016-09-19 DIAGNOSIS — I10 Essential (primary) hypertension: Secondary | ICD-10-CM

## 2016-09-19 DIAGNOSIS — N189 Chronic kidney disease, unspecified: Secondary | ICD-10-CM | POA: Insufficient documentation

## 2016-09-19 DIAGNOSIS — I129 Hypertensive chronic kidney disease with stage 1 through stage 4 chronic kidney disease, or unspecified chronic kidney disease: Secondary | ICD-10-CM | POA: Diagnosis not present

## 2016-09-20 ENCOUNTER — Telehealth: Payer: Self-pay | Admitting: Cardiovascular Disease

## 2016-09-20 NOTE — Telephone Encounter (Signed)
New message    Pt is calling to find out if results have been received from test that was done yesterday.

## 2016-09-20 NOTE — Telephone Encounter (Signed)
Patient calling about test results. Will forward to Dr. Acie Fredrickson and his nurse.

## 2016-09-25 MED ORDER — AMLODIPINE BESYLATE 10 MG PO TABS: 10.0000 mg | ORAL_TABLET | Freq: Every day | ORAL | 3 refills | Status: DC

## 2016-09-25 NOTE — Telephone Encounter (Signed)
Follow up    Pt is calling about test results.

## 2016-09-25 NOTE — Telephone Encounter (Signed)
Reviewed results of renal duplex and Dr. Elmarie Shiley recommendations with patient. She states Dr. Acie Fredrickson called her on Friday but she was in the grocery store and called back to make certain she heard all of his instructions. She has not increased amlodipine to 10 mg yet but will start today. I advised new Rx for 10 mg tablets has been sent to [facility]. I advised her to stop smoking and to monitor for symptoms of abdominal pain or blood in stool and to call back if she has questions or concerns prior to f/u appointment on July 2. She verbalized understanding and agreement with plan and thanked me for the call.

## 2016-09-30 NOTE — Addendum Note (Signed)
Addendum  created 09/30/16 1006 by Oleta Mouse, MD   Sign clinical note

## 2016-10-22 ENCOUNTER — Telehealth: Payer: Self-pay | Admitting: Cardiovascular Disease

## 2016-10-22 NOTE — Telephone Encounter (Signed)
New message    Pt is calling stating that she has an appt with her orthopedic doctor this afternoon. She states that sometimes he will give her a cortisone shot but she wants to know if that is ok with afib? Please call.

## 2016-10-22 NOTE — Telephone Encounter (Signed)
Patient said she normally get steroid injections in her right knee when she sees her orthopedic doctor. Her last injection was in October and since that time she has been diagnosed with a-fib and is on eliquis. Patient advised to inform her orthopedic doctor about her new dx and being on anticoagulation therapy when she goes today and her orthopedic doctor will let her know if she can have a knee injection. Patient verbalized understanding.

## 2016-10-22 NOTE — Telephone Encounter (Signed)
These injections generally stay localized to the injection site (the knee in this case) and thus should not interact with her medications. We usually do not need to hold anticoagulation for the injection as well.

## 2016-10-22 NOTE — Telephone Encounter (Signed)
Patient informed via vm. 

## 2016-10-28 ENCOUNTER — Encounter: Payer: Self-pay | Admitting: Cardiovascular Disease

## 2016-10-28 ENCOUNTER — Ambulatory Visit (INDEPENDENT_AMBULATORY_CARE_PROVIDER_SITE_OTHER): Payer: 59 | Admitting: Cardiovascular Disease

## 2016-10-28 VITALS — BP 122/66 | HR 87 | Ht 66.0 in | Wt 216.0 lb

## 2016-10-28 DIAGNOSIS — E782 Mixed hyperlipidemia: Secondary | ICD-10-CM

## 2016-10-28 DIAGNOSIS — I10 Essential (primary) hypertension: Secondary | ICD-10-CM

## 2016-10-28 DIAGNOSIS — I4819 Other persistent atrial fibrillation: Secondary | ICD-10-CM

## 2016-10-28 DIAGNOSIS — I482 Chronic atrial fibrillation, unspecified: Secondary | ICD-10-CM

## 2016-10-28 DIAGNOSIS — I481 Persistent atrial fibrillation: Secondary | ICD-10-CM

## 2016-10-28 LAB — COMPREHENSIVE METABOLIC PANEL WITH GFR
ALT: 11 IU/L (ref 0–32)
AST: 8 IU/L (ref 0–40)
Albumin/Globulin Ratio: 1.8 (ref 1.2–2.2)
Albumin: 4 g/dL (ref 3.6–4.8)
Alkaline Phosphatase: 109 IU/L (ref 39–117)
BUN/Creatinine Ratio: 7 — ABNORMAL LOW (ref 12–28)
BUN: 14 mg/dL (ref 8–27)
Bilirubin Total: 0.3 mg/dL (ref 0.0–1.2)
CO2: 18 mmol/L — ABNORMAL LOW (ref 20–29)
Calcium: 9.3 mg/dL (ref 8.7–10.3)
Chloride: 104 mmol/L (ref 96–106)
Creatinine, Ser: 1.96 mg/dL — ABNORMAL HIGH (ref 0.57–1.00)
GFR calc Af Amer: 30 mL/min/1.73 — ABNORMAL LOW
GFR calc non Af Amer: 26 mL/min/1.73 — ABNORMAL LOW
Globulin, Total: 2.2 g/dL (ref 1.5–4.5)
Glucose: 159 mg/dL — ABNORMAL HIGH (ref 65–99)
Potassium: 4.7 mmol/L (ref 3.5–5.2)
Sodium: 139 mmol/L (ref 134–144)
Total Protein: 6.2 g/dL (ref 6.0–8.5)

## 2016-10-28 LAB — LIPID PANEL
CHOLESTEROL TOTAL: 167 mg/dL (ref 100–199)
Chol/HDL Ratio: 4.9 ratio — ABNORMAL HIGH (ref 0.0–4.4)
HDL: 34 mg/dL — ABNORMAL LOW (ref >39)
LDL Calculated: 99 mg/dL (ref 0–99)
TRIGLYCERIDES: 172 mg/dL — AB (ref 0–149)
VLDL Cholesterol Cal: 34 mg/dL (ref 5–40)

## 2016-10-28 NOTE — Patient Instructions (Signed)

## 2016-10-28 NOTE — Progress Notes (Signed)
Cardiology Office Note   Date:  10/28/2016   ID:  [name], DOB 14-Feb-1951, MRN [id number]  PCP:  Hulan Fess, MD  Cardiologist:   Mertie Moores, MD   Chief Complaint  Patient presents with  . Persistent atrial fibrillation   Problem list 1. Essential HTN  2. Hyperlipidemia 3. Cardiomegaly on x-ray 4. Diabetes mellitus 5. Obesity  6.      Nov. 13, 2017: Paula Wheeler is a 66 y.o. female who presents for evaluation of cardiomegaly She requested a CXR to look for lung issues ( is a current smoker) was found have cardiac megaly.  Her main complaint is that her legs give out when she is walking. She does not get any regular exercise.  No CP.   Some shortness of breath with exertion  Has DM,   She is getting better control of her glucose levels recently .    She admits that she eats more salt that she probably should. She's canned soup was on occasions. She put salt on her apples.  Feb. 12, 2018:  Pt was seen by Dr. Fletcher Anon for PVD. Was found to have atrial fib.   CHADS2VASC score is 67 ( female, age 29, HTN, PVD)  Started on Eliquis. Developed a GI bleed.  EGD shows erosive gastropathy.   She was told to stop her Goodies powders and Indomethicin ( which she was taking for gout )  She has seen Dr. Oletta Lamas -  She decided to reduce her Eliquis to 5 mg a day .    July 25, 2016:  Pt is seen back for follow Up of her atrial fibrillation. She was started on L course but she developed a GI bleed. She had been taking lots of goodies powders and indomethacin for gout.  July 25, 2016:  No symptoms related to atrial fib. Still smoking.  Her hands are cold at night  BP is up today .  Her Quinipril was reduced to once a day when she was in the hospital in jan. 2018.   October 28, 2016:  Was cardioverted on April 11 Unfortunately, is back in atrial fib She cannot tell that her HR is irreg.  Remains on Eliquis   Echo from 12. /17 showed normal Lv systolic  function   Still smoking   Past Medical History:  Diagnosis Date  . Abnormal urine findings   . Abnormal vaginal Pap smear   . Allergic rhinitis   . Anemia   . Cardiomegaly   . COPD (chronic obstructive pulmonary disease) ([place])   . Dyslipidemia   . Dyspnea   . Encounter for screening for malignant neoplasm of cervix   . Exogenous obesity   . GERD (gastroesophageal reflux disease)   . GI bleed 05/14/2016  . H/O: gout   . History of abnormal cells from cervix   . History of blood transfusion 05/14/2016   "got my 1st today" (05/14/2016)  . Hyperlipidemia   . Hypertension   . Iron deficiency anemia   . Microalbuminuria   . Peripheral edema   . Symptomatic anemia 05/14/2016  . Tobacco dependence   . Tobacco use   . Type II diabetes mellitus ([place])   . Wax in ear     Past Surgical History:  Procedure Laterality Date  . CARDIOVERSION N/A 08/07/2016   Procedure: CARDIOVERSION;  Surgeon: Thayer Headings, MD;  Location: [place];  Service: Cardiovascular;  Laterality: N/A;  . DIAGNOSTIC MAMMOGRAM  05/2015  . ESOPHAGOGASTRODUODENOSCOPY  N/A 05/15/2016   Procedure: ESOPHAGOGASTRODUODENOSCOPY (EGD);  Surgeon: Wonda Horner, MD;  Location: [facility] ENDOSCOPY;  Service: Endoscopy;  Laterality: N/A;  . [place]   "I think they left my appendix"  . PAP SMEAR  2012 AND 2017     Current Outpatient Prescriptions  Medication Sig Dispense Refill  . albuterol (PROVENTIL HFA;VENTOLIN HFA) 108 (90 Base) MCG/ACT inhaler Inhale 2 puffs into the lungs every 4 (four) hours as needed for wheezing or shortness of breath.    . allopurinol (ZYLOPRIM) 100 MG tablet Take 100 mg by mouth daily.    Marland Kitchen amLODipine (NORVASC) 10 MG tablet Take 1 tablet (10 mg total) by mouth daily. 90 tablet 3  . apixaban (ELIQUIS) 5 MG TABS tablet Take 1 tablet (5 mg total) by mouth 2 (two) times daily. 60 tablet 11  . atorvastatin (LIPITOR) 80 MG tablet Take 0.5 tablets (40 mg total) by mouth daily. 90  tablet 3  . Cyanocobalamin (VITAMIN B-12 IJ) Inject as directed every 30 (thirty) days. As directed     . ferrous sulfate 325 (65 FE) MG tablet Take 325 mg by mouth daily.    Marland Kitchen HYDROcodone-acetaminophen (NORCO/VICODIN) 5-325 MG tablet Take 1 tablet by mouth every 6 (six) hours as needed for moderate pain.    Marland Kitchen insulin NPH-regular Human (NOVOLIN 70/30) (70-30) 100 UNIT/ML injection Inject into the skin. INJECT 35 UNITS IN MORNINGS AND 60 UNITS IN EVENINGS    . Insulin Syringe-Needle U-100 (B-D INS SYR ULTRAFINE 1CC/31G) 31G X 5/16" 1 ML MISC by Does not apply route.    . loratadine (CLARITIN) 10 MG tablet Take 10 mg by mouth daily.    . metFORMIN (GLUCOPHAGE) 500 MG tablet TAKE 2 TABLETS BY MOUTH EVERY MORNING AND 3 TABLETS BY MOUTH EVERY EVENING    . pantoprazole (PROTONIX) 40 MG tablet Take 1 tablet (40 mg total) by mouth daily. 60 tablet 0  . TRIAMCINOLONE ACETONIDE EX Apply 1 application topically every morning.     . triamterene-hydrochlorothiazide (MAXZIDE) 75-50 MG tablet Take 1 tablet by mouth daily.    . Cholecalciferol (VITAMIN D3) 50000 units TABS Take 50,000 Units by mouth every 7 (seven) days. Tuesday     No current facility-administered medications for this visit.     Allergies:   Nsaids and Penicillins    Social History:  The patient  reports that she has been smoking Cigarettes.  She has a 20.00 pack-year smoking history. She has never used smokeless tobacco. She reports that she drinks alcohol. She reports that she does not use drugs.   Family History:  The patient's family history includes Cancer in her mother; Colon polyps in her brother, brother, brother, brother, brother, and brother; Diabetes in her father; Heart attack in her father; Hypertension in her mother; Macular degeneration in her mother.    ROS:  Please see the history of present illness.    Review of Systems: Constitutional:  denies fever, chills, diaphoresis, appetite change and fatigue.  HEENT: denies  photophobia, eye pain, redness, hearing loss, ear pain, congestion, sore throat, rhinorrhea, sneezing, neck pain, neck stiffness and tinnitus.  Respiratory: denies SOB, DOE, cough, chest tightness, and wheezing.  Cardiovascular: denies chest pain, palpitations and leg swelling.  Gastrointestinal: denies nausea, vomiting, abdominal pain, diarrhea, constipation, blood in stool.  Genitourinary: denies dysuria, urgency, frequency, hematuria, flank pain and difficulty urinating.  Musculoskeletal: admits to   Leg pain and burning with walking   Skin: admits to  Red scaley rash  on lower legs  Neurological: denies dizziness, seizures, syncope, weakness, light-headedness, numbness and headaches.   Hematological: denies adenopathy, easy bruising, personal or family bleeding history.  Psychiatric/ Behavioral: denies suicidal ideation, mood changes, confusion, nervousness, sleep disturbance and agitation.       All other systems are reviewed and negative.    PHYSICAL EXAM: VS:  BP 122/66   Pulse 87   Ht 5\' 6"  (1.676 m)   Wt 216 lb (98 kg)   SpO2 98%   BMI 34.86 kg/m  , BMI Body mass index is 34.86 kg/m. GEN:  Obese  HEENT: normal  Neck: no JVD, carotid bruits, or masses Cardiac: irreg. Irreg. ; no murmurs, rubs, or gallops,no edema  Respiratory:  clear to auscultation bilaterally, normal work of breathing GI: soft, nontender, nondistended, + BS MS: trace - 1 + leg edema,  scaley rash on lower ext.  Poor distal leg pulses  Skin: warm and dry, no rash Neuro:  Strength and sensation are intact Psych: normal  EKG:    October 27, 2016:  Atrial fib with HR of 87.  NS ST abn.   Recent Labs: 08/20/2016: Hemoglobin 11.7; Platelets 290 09/16/2016: BUN 20; Creatinine, Ser 1.90; Potassium 5.5; Sodium 137    Lipid Panel No results found for: CHOL, TRIG, HDL, CHOLHDL, VLDL, LDLCALC, LDLDIRECT    Wt Readings from Last 3 Encounters:  10/28/16 216 lb (98 kg)  08/07/16 214 lb (97.1 kg)  07/25/16  214 lb 6.4 oz (97.3 kg)      Other studies Reviewed: Additional studies/ records that were reviewed today include: . Review of the above records demonstrates:    ASSESSMENT AND PLAN:  1.   Atrial fib :  Persistent .    Is basically asymptomatic.    We did  cardioversion but she has already gone back into AFib. Will see her in 6 months   2. Essential hypertension: We discussed weight loss and salt restricted. She also needs to quit smoking. We  Tried a higher dose of Quinipril but this caused an increase in her creatinine and her potassium  She is now on Amlodipine 10 mg a day, Maxide 75-50 mg a day ,   3. Claudication:   She needs to return to Dr. Tyrell Antonio  Office. Advised her to stop smoking   4. Hyperlipidemia:     Will  Check labs today . Continue atorvastatin for now    Current medicines are reviewed at length with the patient today.  The patient does not have concerns regarding medicines.  Labs/ tests ordered today include:  No orders of the defined types were placed in this encounter.    Mertie Moores, MD  10/28/2016 8:11 AM    [facility] [place], [place], [place]  [address] Phone: [phone]; Fax: [phone]

## 2016-10-31 ENCOUNTER — Telehealth: Payer: Self-pay | Admitting: Cardiovascular Disease

## 2016-10-31 NOTE — Telephone Encounter (Signed)
-----   Message from Thayer Headings, MD sent at 10/29/2016  9:48 AM EDT ----- Labs are stable Her renal function has remained stable for the past 2 months but the creatinine has remained elevated compared to 5 months ago She is not on much that would alter her creatinine ( Maxzide)  She should discuss with her primary medical doctor . She may need for there evaluation

## 2016-10-31 NOTE — Telephone Encounter (Signed)
New message ° ° ° ° ° ° °Calling to get lab results °

## 2016-10-31 NOTE — Telephone Encounter (Signed)
Returned pts call and advised her of lab results and she has been advised of Dr. Elmarie Shiley recommendations to call Dr. Rex Kras and get a f/u re: elevated cr.  Pt verbalized understanding.

## 2016-11-04 ENCOUNTER — Telehealth: Payer: Self-pay | Admitting: Cardiovascular Disease

## 2016-11-04 NOTE — Telephone Encounter (Signed)
New Message ° ° pt verbalized that she is returning call for rn °

## 2016-11-04 NOTE — Telephone Encounter (Signed)
Pt does not know why someone called her (no message left).  I informed patient that I do not see where someone called her today. Patient will wait to see if they call back.

## 2017-03-31 ENCOUNTER — Encounter (INDEPENDENT_AMBULATORY_CARE_PROVIDER_SITE_OTHER): Payer: Self-pay

## 2017-03-31 ENCOUNTER — Ambulatory Visit (INDEPENDENT_AMBULATORY_CARE_PROVIDER_SITE_OTHER): Payer: 59 | Admitting: Cardiovascular Disease

## 2017-03-31 ENCOUNTER — Encounter: Payer: Self-pay | Admitting: Cardiovascular Disease

## 2017-03-31 VITALS — BP 122/64 | HR 105 | Ht 65.0 in | Wt 211.0 lb

## 2017-03-31 DIAGNOSIS — I10 Essential (primary) hypertension: Secondary | ICD-10-CM

## 2017-03-31 DIAGNOSIS — I481 Persistent atrial fibrillation: Secondary | ICD-10-CM

## 2017-03-31 DIAGNOSIS — E782 Mixed hyperlipidemia: Secondary | ICD-10-CM

## 2017-03-31 DIAGNOSIS — Z7901 Long term (current) use of anticoagulants: Secondary | ICD-10-CM | POA: Diagnosis not present

## 2017-03-31 DIAGNOSIS — I4819 Other persistent atrial fibrillation: Secondary | ICD-10-CM

## 2017-03-31 NOTE — Patient Instructions (Signed)
Medication Instructions:  Your physician recommends that you continue on your current medications as directed. Please refer to the Current Medication list given to you today.   Labwork: TODAY - cholesterol, liver panel, basic metabolic panel, CBC   Testing/Procedures: None Ordered   Follow-Up: Your physician wants you to follow-up in: 6 months with Dr. Acie Fredrickson.  You will receive a reminder letter in the mail two months in advance. If you don't receive a letter, please call our office to schedule the follow-up appointment.   If you need a refill on your cardiac medications before your next appointment, please call your pharmacy.   Thank you for choosing [facility]! Christen Bame, RN [phone]

## 2017-03-31 NOTE — Progress Notes (Signed)
Cardiology Office Note   Date:  04-30-2017   ID:  Paula Wheeler, DOB 04-Mar-1951, MRN [id number]  PCP:  Hulan Fess, MD  Cardiologist:   Mertie Moores, MD   No chief complaint on file.  Problem list 1. Essential HTN  2. Hyperlipidemia 3. Cardiomegaly on x-ray 4. Diabetes mellitus 5. Obesity  6. Atrial fib -CHADS2VASC score is 94 ( female, age 66, HTN, PVD)     Nov. 13, 2017: Paula Wheeler is a 66 y.o. female who presents for evaluation of cardiomegaly She requested a CXR to look for lung issues ( is a current smoker) was found have cardiac megaly.  Her main complaint is that her legs give out when she is walking. She does not get any regular exercise.  No CP.   Some shortness of breath with exertion  Has DM,   She is getting better control of her glucose levels recently .    She admits that she eats more salt that she probably should. She's canned soup was on occasions. She put salt on her apples.  Feb. 12, 2018:  Pt was seen by Dr. Fletcher Anon for PVD. Was found to have atrial fib.   CHADS2VASC score is 67 ( female, age 40, HTN, PVD)  Started on Eliquis. Developed a GI bleed.  EGD shows erosive gastropathy.   She was told to stop her Goodies powders and Indomethicin ( which she was taking for gout )  She has seen Dr. Oletta Lamas -  She decided to reduce her Eliquis to 5 mg a day .    July 25, 2016:  Pt is seen back for follow Up of her atrial fibrillation. She was started on L course but she developed a GI bleed. She had been taking lots of goodies powders and indomethacin for gout.  July 25, 2016:  No symptoms related to atrial fib. Still smoking.  Her hands are cold at night  BP is up today .  Her Quinipril was reduced to once a day when she was in the hospital in jan. 2018.   October 28, 2016:  Was cardioverted on April 11 Unfortunately, is back in atrial fib She cannot tell that her HR is irreg.  Remains on Eliquis   Echo from 12. /17 showed normal Lv  systolic function   Still smoking   Apr 30, 2017:  Paula Wheeler died recently . No CP or dyspnea  Hx of atria fib   Past Medical History:  Diagnosis Date  . Abnormal urine findings   . Abnormal vaginal Pap smear   . Allergic rhinitis   . Anemia   . Cardiomegaly   . COPD (chronic obstructive pulmonary disease) ([place])   . Dyslipidemia   . Dyspnea   . Encounter for screening for malignant neoplasm of cervix   . Exogenous obesity   . GERD (gastroesophageal reflux disease)   . GI bleed 05/14/2016  . H/O: gout   . History of abnormal cells from cervix   . History of blood transfusion 05/14/2016   "got my 1st today" (05/14/2016)  . Hyperlipidemia   . Hypertension   . Iron deficiency anemia   . Microalbuminuria   . Peripheral edema   . Symptomatic anemia 05/14/2016  . Tobacco dependence   . Tobacco use   . Type II diabetes mellitus ([place])   . Wax in ear     Past Surgical History:  Procedure Laterality Date  . CARDIOVERSION N/A 08/07/2016   Procedure: CARDIOVERSION;  Surgeon:  Thayer Headings, MD;  Location: [place];  Service: Cardiovascular;  Laterality: N/A;  . DIAGNOSTIC MAMMOGRAM  05/2015  . ESOPHAGOGASTRODUODENOSCOPY N/A 05/15/2016   Procedure: ESOPHAGOGASTRODUODENOSCOPY (EGD);  Surgeon: Wonda Horner, MD;  Location: [facility] ENDOSCOPY;  Service: Endoscopy;  Laterality: N/A;  . [place]   "I think they left my appendix"  . PAP SMEAR  2012 AND 2017     Current Outpatient Medications  Medication Sig Dispense Refill  . albuterol (PROVENTIL HFA;VENTOLIN HFA) 108 (90 Base) MCG/ACT inhaler Inhale 2 puffs into the lungs every 4 (four) hours as needed for wheezing or shortness of breath.    . allopurinol (ZYLOPRIM) 100 MG tablet Take 100 mg by mouth daily.    Marland Kitchen amLODipine (NORVASC) 10 MG tablet Take 1 tablet (10 mg total) by mouth daily. 90 tablet 3  . apixaban (ELIQUIS) 5 MG TABS tablet Take 1 tablet (5 mg total) by mouth 2 (two) times daily. 60 tablet 11  .  atorvastatin (LIPITOR) 80 MG tablet Take 0.5 tablets (40 mg total) by mouth daily. 90 tablet 3  . Cholecalciferol (VITAMIN D3) 50000 units TABS Take 50,000 Units by mouth every 7 (seven) days. Tuesday    . Cyanocobalamin (VITAMIN B-12 IJ) Inject as directed every 30 (thirty) days. As directed     . ferrous sulfate 325 (65 FE) MG tablet Take 325 mg by mouth daily.    Marland Kitchen HYDROcodone-acetaminophen (NORCO/VICODIN) 5-325 MG tablet Take 1 tablet by mouth every 6 (six) hours as needed for moderate pain.    Marland Kitchen insulin NPH-regular Human (NOVOLIN 70/30) (70-30) 100 UNIT/ML injection Inject into the skin. INJECT 35 UNITS IN MORNINGS AND 60 UNITS IN EVENINGS    . Insulin Syringe-Needle U-100 (B-D INS SYR ULTRAFINE 1CC/31G) 31G X 5/16" 1 ML MISC by Does not apply route.    . loratadine (CLARITIN) 10 MG tablet Take 10 mg by mouth daily.    . metFORMIN (GLUCOPHAGE) 500 MG tablet TAKE 2 TABLETS BY MOUTH EVERY MORNING AND 3 TABLETS BY MOUTH EVERY EVENING    . pantoprazole (PROTONIX) 40 MG tablet Take 1 tablet (40 mg total) by mouth daily. 60 tablet 0  . TRIAMCINOLONE ACETONIDE EX Apply 1 application topically every morning.     . triamterene-hydrochlorothiazide (MAXZIDE) 75-50 MG tablet Take 1 tablet by mouth daily.     No current facility-administered medications for this visit.     Allergies:   Nsaids and Penicillins    Social History:  The patient  reports that she has been smoking cigarettes.  She has a 20.00 pack-year smoking history. she has never used smokeless tobacco. She reports that she drinks alcohol. She reports that she does not use drugs.   Family History:  The patient's family history includes Cancer in her Paula Wheeler; Colon polyps in her brother, brother, brother, brother, brother, and brother; Diabetes in her father; Heart attack in her father; Hypertension in her Paula Wheeler; Macular degeneration in her Paula Wheeler.    ROS:  Please see the history of present illness.     All other systems are reviewed  and negative.   Physical Exam: There were no vitals taken for this visit.  GEN:  Well nourished, well developed in no acute distress HEENT: Normal NECK: No JVD; No carotid bruits LYMPHATICS: No lymphadenopathy CARDIAC: Irreg. Irreg, no murmurs, rubs, gallops RESPIRATORY:  Clear to auscultation without rales, wheezing or rhonchi  ABDOMEN: Soft, non-tender, non-distended MUSCULOSKELETAL:  No edema; No deformity  SKIN: Warm and dry NEUROLOGIC:  Alert and oriented x 3   EKG:       Recent Labs: 08/20/2016: Hemoglobin 11.7; Platelets 290 10/28/2016: ALT 11; BUN 14; Creatinine, Ser 1.96; Potassium 4.7; Sodium 139    Lipid Panel    Component Value Date/Time   CHOL 167 10/28/2016 0827   TRIG 172 (H) 10/28/2016 0827   HDL 34 (L) 10/28/2016 0827   CHOLHDL 4.9 (H) 10/28/2016 0827   LDLCALC 99 10/28/2016 0827      Wt Readings from Last 3 Encounters:  10/28/16 216 lb (98 kg)  08/07/16 214 lb (97.1 kg)  07/25/16 214 lb 6.4 oz (97.3 kg)      Other studies Reviewed: Additional studies/ records that were reviewed today include: . Review of the above records demonstrates:    ASSESSMENT AND PLAN:  1.   Atrial fib : Persistent atrial fibrillation.  Continue on Eliquis.  Check basic metabolic profile and CBC today.  2. Essential hypertension: BP is well controlled.   3. Claudication:   Advised her to stop smoking  4. Hyperlipidemia:       Check labs today.  Current medicines are reviewed at length with the patient today.  The patient does not have concerns regarding medicines.  Labs/ tests ordered today include:  No orders of the defined types were placed in this encounter.    Mertie Moores, MD  03/31/2017 7:58 AM    [address], [place], [place]  [address] Phone: [phone]; Fax: [phone]

## 2017-04-01 LAB — CBC
Hematocrit: 32.6 % — ABNORMAL LOW (ref 34.0–46.6)
Hemoglobin: 10.4 g/dL — ABNORMAL LOW (ref 11.1–15.9)
MCH: 28.1 pg (ref 26.6–33.0)
MCHC: 31.9 g/dL (ref 31.5–35.7)
MCV: 88 fL (ref 79–97)
PLATELETS: 355 10*3/uL (ref 150–379)
RBC: 3.7 x10E6/uL — AB (ref 3.77–5.28)
RDW: 15.6 % — ABNORMAL HIGH (ref 12.3–15.4)
WBC: 8.4 10*3/uL (ref 3.4–10.8)

## 2017-04-01 LAB — BASIC METABOLIC PANEL
BUN/Creatinine Ratio: 10 — ABNORMAL LOW (ref 12–28)
BUN: 20 mg/dL (ref 8–27)
CALCIUM: 9.2 mg/dL (ref 8.7–10.3)
CHLORIDE: 102 mmol/L (ref 96–106)
CO2: 19 mmol/L — ABNORMAL LOW (ref 20–29)
Creatinine, Ser: 2.01 mg/dL — ABNORMAL HIGH (ref 0.57–1.00)
GFR, EST AFRICAN AMERICAN: 29 mL/min/{1.73_m2} — AB (ref >59)
GFR, EST NON AFRICAN AMERICAN: 25 mL/min/{1.73_m2} — AB (ref >59)
Glucose: 146 mg/dL — ABNORMAL HIGH (ref 65–99)
Potassium: 4.4 mmol/L (ref 3.5–5.2)
Sodium: 138 mmol/L (ref 134–144)

## 2017-04-01 LAB — LIPID PANEL
CHOLESTEROL TOTAL: 160 mg/dL (ref 100–199)
Chol/HDL Ratio: 4.7 ratio — ABNORMAL HIGH (ref 0.0–4.4)
HDL: 34 mg/dL — ABNORMAL LOW (ref >39)
LDL Calculated: 90 mg/dL (ref 0–99)
Triglycerides: 180 mg/dL — ABNORMAL HIGH (ref 0–149)
VLDL Cholesterol Cal: 36 mg/dL (ref 5–40)

## 2017-04-01 LAB — HEPATIC FUNCTION PANEL
ALK PHOS: 113 IU/L (ref 39–117)
ALT: 8 IU/L (ref 0–32)
AST: 11 IU/L (ref 0–40)
Albumin: 4.1 g/dL (ref 3.6–4.8)
BILIRUBIN, DIRECT: 0.12 mg/dL (ref 0.00–0.40)
Bilirubin Total: <0.2 mg/dL (ref 0.0–1.2)
Total Protein: 6.2 g/dL (ref 6.0–8.5)

## 2017-04-17 ENCOUNTER — Other Ambulatory Visit: Payer: Self-pay

## 2017-04-17 MED ORDER — APIXABAN 5 MG PO TABS: 5.0000 mg | ORAL_TABLET | Freq: Two times a day (BID) | ORAL | 1 refills | Status: DC

## 2017-07-21 ENCOUNTER — Other Ambulatory Visit: Payer: Self-pay | Admitting: Family Medicine

## 2017-07-21 DIAGNOSIS — Z1231 Encounter for screening mammogram for malignant neoplasm of breast: Secondary | ICD-10-CM

## 2017-08-07 ENCOUNTER — Ambulatory Visit
Admission: RE | Admit: 2017-08-07 | Discharge: 2017-08-07 | Disposition: A | Discharge status: Home / Self Care | Payer: 59 | Source: Ambulatory Visit | Attending: Family Medicine | Admitting: Family Medicine

## 2017-08-07 DIAGNOSIS — Z1231 Encounter for screening mammogram for malignant neoplasm of breast: Secondary | ICD-10-CM

## 2017-09-03 ENCOUNTER — Other Ambulatory Visit: Payer: Self-pay | Admitting: Cardiovascular Disease

## 2017-09-14 ENCOUNTER — Other Ambulatory Visit: Payer: Self-pay | Admitting: Cardiovascular Disease

## 2017-09-17 ENCOUNTER — Ambulatory Visit: Payer: 59 | Admitting: Cardiovascular Disease

## 2017-09-17 ENCOUNTER — Encounter: Payer: Self-pay | Admitting: Cardiovascular Disease

## 2017-09-17 ENCOUNTER — Telehealth (HOSPITAL_COMMUNITY): Payer: Self-pay | Admitting: *Deleted

## 2017-09-17 VITALS — BP 140/60 | HR 90 | Ht 64.6 in | Wt 226.1 lb

## 2017-09-17 DIAGNOSIS — I4819 Other persistent atrial fibrillation: Secondary | ICD-10-CM

## 2017-09-17 DIAGNOSIS — I481 Persistent atrial fibrillation: Secondary | ICD-10-CM

## 2017-09-17 DIAGNOSIS — I5033 Acute on chronic diastolic (congestive) heart failure: Secondary | ICD-10-CM | POA: Diagnosis not present

## 2017-09-17 DIAGNOSIS — E782 Mixed hyperlipidemia: Secondary | ICD-10-CM | POA: Diagnosis not present

## 2017-09-17 MED ORDER — TORSEMIDE 20 MG PO TABS: 20.0000 mg | ORAL_TABLET | Freq: Every day | ORAL | 3 refills | Status: DC

## 2017-09-17 NOTE — Telephone Encounter (Signed)
Pt referred to clinic by Dr. Acie Fredrickson.  Left msg on pt machine to call back to schedule

## 2017-09-17 NOTE — Progress Notes (Signed)
Cardiology Office Note   Date:  09/17/2017   ID:  Paula Wheeler, DOB May 03, 1950, MRN [id number]  PCP:  Hulan Fess, MD  Cardiologist:   Mertie Moores, MD   Chief Complaint  Patient presents with  . Atrial Fibrillation   Problem list 1. Essential HTN  2. Hyperlipidemia 3. Cardiomegaly on x-ray 4. Diabetes mellitus 5. Obesity  6. Atrial fib -CHADS2VASC score is 39 ( female, age 67, HTN, PVD)     Nov. 13, 2017: Paula Wheeler is a 67 y.o. female who presents for evaluation of cardiomegaly She requested a CXR to look for lung issues ( is a current smoker) was found have cardiac megaly.  Her main complaint is that her legs give out when she is walking. She does not get any regular exercise.  No CP.   Some shortness of breath with exertion  Has DM,   She is getting better control of her glucose levels recently .    She admits that she eats more salt that she probably should. She's canned soup was on occasions. She put salt on her apples.  Feb. 12, 2018:  Paula Wheeler was seen by Dr. Fletcher Anon for PVD. Was found to have atrial fib.   CHADS2VASC score is 39 ( female, age 21, HTN, PVD)  Started on Eliquis. Developed a GI bleed.  EGD shows erosive gastropathy.   She was told to stop her Goodies powders and Indomethicin ( which she was taking for gout )  She has seen Dr. Oletta Lamas -  She decided to reduce her Eliquis to 5 mg a day .    July 25, 2016:  Paula Wheeler is seen back for follow Up of her atrial fibrillation. She was started on L course but she developed a GI bleed. She had been taking lots of goodies powders and indomethacin for gout.  July 25, 2016:  No symptoms related to atrial fib. Still smoking.  Her hands are cold at night  BP is up today .  Her Quinipril was reduced to once a day when she was in the hospital in jan. 2018.   October 28, 2016:  Was cardioverted on April 11 Unfortunately, is back in atrial fib She cannot tell that her HR is irreg.  Remains on Eliquis    Echo from 12. /17 showed normal Lv systolic function   Still smoking   April 16, 2017:  Mother died recently . No CP or dyspnea  Hx of atria fib   Sep 17, 2017:  Paula Wheeler is seen for follow up of atrial fib  Is having difficulty breathing and her legs are swollen  Dyspnea for the past 3 weeks.  Not associated with any CP Has cut back on her smoking  Wt today is 226.  ( up 15 lbs since Dec. 2018)  Has lack of energy . Seems to feel better when she is on prednisone   She is in persistent atrial fibrillation.  We cardioverted her about a year ago but it only lasted for several weeks.  We discussed going to the A. fib clinic for consideration of Tikosyn therapy.   Past Medical History:  Diagnosis Date  . Abnormal urine findings   . Abnormal vaginal Pap smear   . Allergic rhinitis   . Anemia   . Cardiomegaly   . COPD (chronic obstructive pulmonary disease) ([place])   . Dyslipidemia   . Dyspnea   . Encounter for screening for malignant neoplasm of cervix   . Exogenous  obesity   . GERD (gastroesophageal reflux disease)   . GI bleed 05/14/2016  . H/O: gout   . History of abnormal cells from cervix   . History of blood transfusion 05/14/2016   "got my 1st today" (05/14/2016)  . Hyperlipidemia   . Hypertension   . Iron deficiency anemia   . Microalbuminuria   . Peripheral edema   . Symptomatic anemia 05/14/2016  . Tobacco dependence   . Tobacco use   . Type II diabetes mellitus ([place])   . Wax in ear     Past Surgical History:  Procedure Laterality Date  . CARDIOVERSION N/A 08/07/2016   Procedure: CARDIOVERSION;  Surgeon: Thayer Headings, MD;  Location: [place];  Service: Cardiovascular;  Laterality: N/A;  . DIAGNOSTIC MAMMOGRAM  05/2015  . ESOPHAGOGASTRODUODENOSCOPY N/A 05/15/2016   Procedure: ESOPHAGOGASTRODUODENOSCOPY (EGD);  Surgeon: Wonda Horner, MD;  Location: [facility] ENDOSCOPY;  Service: Endoscopy;  Laterality: N/A;  . [place]   "I think  they left my appendix"  . PAP SMEAR  2012 AND 2017     Current Outpatient Medications  Medication Sig Dispense Refill  . albuterol (PROVENTIL HFA;VENTOLIN HFA) 108 (90 Base) MCG/ACT inhaler Inhale 2 puffs into the lungs every 4 (four) hours as needed for wheezing or shortness of breath.    . allopurinol (ZYLOPRIM) 100 MG tablet Take 100 mg by mouth daily.    Marland Kitchen amLODipine (NORVASC) 10 MG tablet TAKE 1 TABLET(10 MG) BY MOUTH DAILY 90 tablet 0  . apixaban (ELIQUIS) 5 MG TABS tablet Take 1 tablet (5 mg total) by mouth 2 (two) times daily. 180 tablet 1  . atorvastatin (LIPITOR) 80 MG tablet Take 0.5 tablets (40 mg total) by mouth daily. 90 tablet 3  . Cholecalciferol (VITAMIN D3) 50000 units TABS Take 50,000 Units by mouth every 7 (seven) days. Tuesday    . Cyanocobalamin (VITAMIN B-12 IJ) Inject as directed every 30 (thirty) days. As directed     . escitalopram (LEXAPRO) 10 MG tablet Take 1 tablet by mouth daily.  12  . ferrous sulfate 325 (65 FE) MG tablet Take 325 mg by mouth daily.    Marland Kitchen HYDROcodone-acetaminophen (NORCO/VICODIN) 5-325 MG tablet Take 1 tablet by mouth every 6 (six) hours as needed for moderate pain.    Marland Kitchen insulin NPH-regular Human (NOVOLIN 70/30) (70-30) 100 UNIT/ML injection Inject into the skin. INJECT 35 UNITS IN MORNINGS AND 60 UNITS IN EVENINGS    . Insulin Syringe-Needle U-100 (B-D INS SYR ULTRAFINE 1CC/31G) 31G X 5/16" 1 ML MISC by Does not apply route.    . loratadine (CLARITIN) 10 MG tablet Take 10 mg by mouth daily.    Marland Kitchen losartan (COZAAR) 50 MG tablet Take 1 tablet by mouth daily.  12  . metFORMIN (GLUCOPHAGE) 500 MG tablet TAKE 2 TABLETS BY MOUTH EVERY MORNING AND 3 TABLETS BY MOUTH EVERY EVENING    . pantoprazole (PROTONIX) 40 MG tablet Take 1 tablet (40 mg total) by mouth daily. 60 tablet 0  . ranitidine (ZANTAC) 150 MG tablet Take 1 tablet by mouth 2 (two) times daily.  12  . SPIRIVA HANDIHALER 18 MCG inhalation capsule Take 1 puff by mouth daily.    .  TRIAMCINOLONE ACETONIDE EX Apply 1 application topically every morning.     . triamterene-hydrochlorothiazide (MAXZIDE) 75-50 MG tablet Take 1 tablet by mouth daily.    Marland Kitchen torsemide (DEMADEX) 20 MG tablet Take 1 tablet (20 mg total) by mouth daily. 90 tablet 3  No current facility-administered medications for this visit.     Allergies:   Nsaids and Penicillins    Social History:  The patient  reports that she has been smoking cigarettes.  She has a 20.00 pack-year smoking history. She has never used smokeless tobacco. She reports that she drinks alcohol. She reports that she does not use drugs.   Family History:  The patient's family history includes Cancer in her mother; Colon polyps in her brother, brother, brother, brother, brother, and brother; Diabetes in her father; Heart attack in her father; Hypertension in her mother; Macular degeneration in her mother.    ROS:  Please see the history of present illness.   Physical Exam: Blood pressure 140/60, pulse 90, height 5' 4.6" (1.641 m), weight 226 lb 1.9 oz (102.6 kg), SpO2 90 %.  GEN:  Middle age female,  Obese , NAD  HEENT: Normal NECK: No JVD; No carotid bruits LYMPHATICS: No lymphadenopathy CARDIAC: Irreg. Irreg.  RESPIRATORY:  Clear to auscultation without rales, wheezing or rhonchi  ABDOMEN: Soft, non-tender, non-distended MUSCULOSKELETAL: tense edema up to her thighs  SKIN: Warm and dry NEUROLOGIC:  Alert and oriented x 3   EKG:   Sep 17, 2017: Atrial fibrillation at 90.  Nonspecific ST and T wave changes.    Recent Labs: 03/31/2017: ALT 8; BUN 20; Creatinine, Ser 2.01; Hemoglobin 10.4; Platelets 355; Potassium 4.4; Sodium 138    Lipid Panel    Component Value Date/Time   CHOL 160 03/31/2017 0846   TRIG 180 (H) 03/31/2017 0846   HDL 34 (L) 03/31/2017 0846   CHOLHDL 4.7 (H) 03/31/2017 0846   LDLCALC 90 03/31/2017 0846      Wt Readings from Last 3 Encounters:  09/17/17 226 lb 1.9 oz (102.6 kg)  03/31/17 211 lb  (95.7 kg)  10/28/16 216 lb (98 kg)      Other studies Reviewed: Additional studies/ records that were reviewed today include: . Review of the above records demonstrates:    ASSESSMENT AND PLAN:  1.   Atrial fib : Persistent atrial fibrillation.  Continue on Eliquis.  We tried cardioversion but it only lasted for several weeks.  We will refer her to the A. fib clinic for consideration of Tikosyn therapy.  Restoration of sinus rhythm might help with her progressive shortness of breath/congestive heart failure:  2.   acute on chronic diastolic congestive heart failure: The patient has a history of chronic diastolic heart failure.  She is now in atrial fibrillation so we cannot formally make that diagnosis but she is had grade 2 diastolic dysfunction in the past.  She presents with 3 weeks of progressive shortness of breath and leg swelling.  She is on Eliquis and has not missed any doses so I do not think that this is due to a DVT.  She has edema up to her thighs.   We will get an echocardiogram for further evaluation of her cardiac function.  Start her on torsemide 20 mg a day.  We will check a basic metabolic profile in 3 weeks.  Ulcer see her in 3 to 4 months for follow-up visit.  2. Essential hypertension:  BP is elevated today .   Adding torsemide   3. Claudication:   Advised her to stop smoking  - again   4. Hyperlipidemia:       Check labs today   5.  Chronic kidney disease:   Baseline creatinine is 2.  This increased dyspnea and leg swelling may be  due to advanced CKD  Current medicines are reviewed at length with the patient today.  The patient does not have concerns regarding medicines.  Labs/ tests ordered today include:   Orders Placed This Encounter  Procedures  . Basic Metabolic Panel (BMET)  . Basic Metabolic Panel (BMET)  . Hepatic function panel  . Lipid Profile  . Amb Referral to AFIB Clinic  . ECHOCARDIOGRAM COMPLETE     Mertie Moores, MD  09/17/2017 2:12  PM    [facility] [place], [place], [place]  [address] Phone: [phone]; Fax: [phone]

## 2017-09-17 NOTE — Patient Instructions (Addendum)
Medication Instructions:  Your physician has recommended you make the following change in your medication: START Torsemide 20mg  once a day.   Labwork:  Today: Cholesterol, Liver panel, bmet   Your physician recommends that you return for lab work in: 3 weeks for bmet   Testing/Procedures: Your physician has requested that you have an echocardiogram. Echocardiography is a painless test that uses sound waves to create images of your heart. It provides your doctor with information about the size and shape of your heart and how well your heart's chambers and valves are working. This procedure takes approximately one hour. There are no restrictions for this procedure.  Follow-Up: Your physician recommends that you schedule a follow-up appointment in: 3-4 months with Dr. Vilinda Boehringer have been referred to Afib clinic       Any Other Special Instructions Will Be Listed Below (If Applicable).     If you need a refill on your cardiac medications before your next appointment, please call your pharmacy.

## 2017-09-18 ENCOUNTER — Telehealth: Payer: Self-pay | Admitting: Cardiovascular Disease

## 2017-09-18 ENCOUNTER — Other Ambulatory Visit: Payer: 59 | Admitting: *Deleted

## 2017-09-18 DIAGNOSIS — E875 Hyperkalemia: Secondary | ICD-10-CM

## 2017-09-18 LAB — BASIC METABOLIC PANEL
BUN / CREAT RATIO: 14 (ref 12–28)
BUN: 23 mg/dL (ref 8–27)
CHLORIDE: 108 mmol/L — AB (ref 96–106)
CO2: 18 mmol/L — ABNORMAL LOW (ref 20–29)
Calcium: 9.2 mg/dL (ref 8.7–10.3)
Creatinine, Ser: 1.69 mg/dL — ABNORMAL HIGH (ref 0.57–1.00)
GFR, EST AFRICAN AMERICAN: 36 mL/min/{1.73_m2} — AB (ref >59)
GFR, EST NON AFRICAN AMERICAN: 31 mL/min/{1.73_m2} — AB (ref >59)
Glucose: 100 mg/dL — ABNORMAL HIGH (ref 65–99)
Potassium: 6 mmol/L (ref 3.5–5.2)
Sodium: 140 mmol/L (ref 134–144)

## 2017-09-18 LAB — LIPID PANEL
CHOLESTEROL TOTAL: 149 mg/dL (ref 100–199)
Chol/HDL Ratio: 4.5 ratio — ABNORMAL HIGH (ref 0.0–4.4)
HDL: 33 mg/dL — ABNORMAL LOW (ref >39)
LDL Calculated: 85 mg/dL (ref 0–99)
Triglycerides: 157 mg/dL — ABNORMAL HIGH (ref 0–149)
VLDL Cholesterol Cal: 31 mg/dL (ref 5–40)

## 2017-09-18 LAB — HEPATIC FUNCTION PANEL
ALK PHOS: 102 IU/L (ref 39–117)
ALT: 7 IU/L (ref 0–32)
AST: 8 IU/L (ref 0–40)
Albumin: 4.1 g/dL (ref 3.6–4.8)
BILIRUBIN, DIRECT: 0.12 mg/dL (ref 0.00–0.40)
Bilirubin Total: 0.3 mg/dL (ref 0.0–1.2)
Total Protein: 6.5 g/dL (ref 6.0–8.5)

## 2017-09-18 NOTE — Telephone Encounter (Signed)
Critical K+ of 6.0 called in from lab.  Spoke with Dr. Irish Lack, [place].  He said to have pt come in to recheck BMET today to re-evaluate pt's K+ before changing meds.  Called pt and left message to call back.

## 2017-09-18 NOTE — Telephone Encounter (Signed)
New Message   Santiago Glad with [facility] is calling to report a critical lab.

## 2017-09-18 NOTE — Telephone Encounter (Signed)
Attempted pt's cell number.  Left message to call back.

## 2017-09-18 NOTE — Addendum Note (Signed)
Addended by: De Burrs on: 09/18/2017 10:34 AM   Modules accepted: Orders

## 2017-09-18 NOTE — Telephone Encounter (Signed)
Spoke with pt and went over labs and recommendations per Dr. Irish Lack. Pt agreeable to come by for labs but can't come until this afternoon.  Pt states she eats bananas several days a week and eats 2-3 oranges a day.  Advised pt to decrease dietary K+ for now.  Will route to triage to f/u on labs tomorrow as results are not likely to come back today.

## 2017-09-19 LAB — BASIC METABOLIC PANEL
BUN/Creatinine Ratio: 11 — ABNORMAL LOW (ref 12–28)
BUN: 25 mg/dL (ref 8–27)
CO2: 19 mmol/L — AB (ref 20–29)
Calcium: 9.4 mg/dL (ref 8.7–10.3)
Chloride: 106 mmol/L (ref 96–106)
Creatinine, Ser: 2.21 mg/dL — ABNORMAL HIGH (ref 0.57–1.00)
GFR calc Af Amer: 26 mL/min/{1.73_m2} — ABNORMAL LOW (ref >59)
GFR calc non Af Amer: 22 mL/min/{1.73_m2} — ABNORMAL LOW (ref >59)
GLUCOSE: 191 mg/dL — AB (ref 65–99)
POTASSIUM: 5.2 mmol/L (ref 3.5–5.2)
SODIUM: 140 mmol/L (ref 134–144)

## 2017-09-19 NOTE — Telephone Encounter (Signed)
Informed pt that her K+ was back to normal. Pt will cut her intake of K+ slightly - she normally runs WNL. Advised that pt that I would forward to Dr. Acie Fredrickson for his review/FYI. Pt understands office will call back if he has further recommendation/s.

## 2017-09-24 ENCOUNTER — Other Ambulatory Visit: Payer: Self-pay

## 2017-09-24 ENCOUNTER — Ambulatory Visit (HOSPITAL_COMMUNITY): Payer: 59 | Attending: Cardiovascular Disease

## 2017-09-24 DIAGNOSIS — E669 Obesity, unspecified: Secondary | ICD-10-CM | POA: Insufficient documentation

## 2017-09-24 DIAGNOSIS — E119 Type 2 diabetes mellitus without complications: Secondary | ICD-10-CM | POA: Diagnosis not present

## 2017-09-24 DIAGNOSIS — E785 Hyperlipidemia, unspecified: Secondary | ICD-10-CM | POA: Insufficient documentation

## 2017-09-24 DIAGNOSIS — D649 Anemia, unspecified: Secondary | ICD-10-CM | POA: Insufficient documentation

## 2017-09-24 DIAGNOSIS — I071 Rheumatic tricuspid insufficiency: Secondary | ICD-10-CM | POA: Insufficient documentation

## 2017-09-24 DIAGNOSIS — Z6831 Body mass index (BMI) 31.0-31.9, adult: Secondary | ICD-10-CM | POA: Diagnosis not present

## 2017-09-24 DIAGNOSIS — Z720 Tobacco use: Secondary | ICD-10-CM | POA: Insufficient documentation

## 2017-09-24 DIAGNOSIS — I5033 Acute on chronic diastolic (congestive) heart failure: Secondary | ICD-10-CM

## 2017-09-24 DIAGNOSIS — I4891 Unspecified atrial fibrillation: Secondary | ICD-10-CM | POA: Diagnosis not present

## 2017-09-24 DIAGNOSIS — I110 Hypertensive heart disease with heart failure: Secondary | ICD-10-CM | POA: Insufficient documentation

## 2017-10-01 ENCOUNTER — Ambulatory Visit (HOSPITAL_COMMUNITY): Payer: 59 | Admitting: Nurse Practitioner

## 2017-10-08 ENCOUNTER — Ambulatory Visit (HOSPITAL_COMMUNITY): Payer: 59 | Admitting: Nurse Practitioner

## 2017-10-15 ENCOUNTER — Other Ambulatory Visit: Payer: 59 | Admitting: *Deleted

## 2017-10-15 ENCOUNTER — Ambulatory Visit (HOSPITAL_COMMUNITY)
Admission: RE | Admit: 2017-10-15 | Discharge: 2017-10-15 | Disposition: A | Discharge status: Home / Self Care | Payer: 59 | Source: Ambulatory Visit | Attending: Nurse Practitioner | Admitting: Nurse Practitioner

## 2017-10-15 ENCOUNTER — Encounter (HOSPITAL_COMMUNITY): Payer: Self-pay | Admitting: Nurse Practitioner

## 2017-10-15 VITALS — BP 142/64 | HR 88 | Ht 64.6 in | Wt 214.0 lb

## 2017-10-15 DIAGNOSIS — N189 Chronic kidney disease, unspecified: Secondary | ICD-10-CM | POA: Insufficient documentation

## 2017-10-15 DIAGNOSIS — Z8249 Family history of ischemic heart disease and other diseases of the circulatory system: Secondary | ICD-10-CM | POA: Insufficient documentation

## 2017-10-15 DIAGNOSIS — I130 Hypertensive heart and chronic kidney disease with heart failure and stage 1 through stage 4 chronic kidney disease, or unspecified chronic kidney disease: Secondary | ICD-10-CM | POA: Diagnosis not present

## 2017-10-15 DIAGNOSIS — J449 Chronic obstructive pulmonary disease, unspecified: Secondary | ICD-10-CM | POA: Diagnosis not present

## 2017-10-15 DIAGNOSIS — Z7901 Long term (current) use of anticoagulants: Secondary | ICD-10-CM | POA: Diagnosis not present

## 2017-10-15 DIAGNOSIS — I5032 Chronic diastolic (congestive) heart failure: Secondary | ICD-10-CM | POA: Insufficient documentation

## 2017-10-15 DIAGNOSIS — Z79899 Other long term (current) drug therapy: Secondary | ICD-10-CM | POA: Insufficient documentation

## 2017-10-15 DIAGNOSIS — E1122 Type 2 diabetes mellitus with diabetic chronic kidney disease: Secondary | ICD-10-CM | POA: Diagnosis not present

## 2017-10-15 DIAGNOSIS — Z720 Tobacco use: Secondary | ICD-10-CM

## 2017-10-15 DIAGNOSIS — I481 Persistent atrial fibrillation: Secondary | ICD-10-CM | POA: Diagnosis not present

## 2017-10-15 DIAGNOSIS — I5033 Acute on chronic diastolic (congestive) heart failure: Secondary | ICD-10-CM

## 2017-10-15 DIAGNOSIS — Z6836 Body mass index (BMI) 36.0-36.9, adult: Secondary | ICD-10-CM | POA: Insufficient documentation

## 2017-10-15 DIAGNOSIS — K219 Gastro-esophageal reflux disease without esophagitis: Secondary | ICD-10-CM | POA: Diagnosis not present

## 2017-10-15 DIAGNOSIS — D509 Iron deficiency anemia, unspecified: Secondary | ICD-10-CM | POA: Insufficient documentation

## 2017-10-15 DIAGNOSIS — I4819 Other persistent atrial fibrillation: Secondary | ICD-10-CM

## 2017-10-15 DIAGNOSIS — Z794 Long term (current) use of insulin: Secondary | ICD-10-CM | POA: Diagnosis not present

## 2017-10-15 DIAGNOSIS — E785 Hyperlipidemia, unspecified: Secondary | ICD-10-CM | POA: Diagnosis not present

## 2017-10-15 DIAGNOSIS — F17210 Nicotine dependence, cigarettes, uncomplicated: Secondary | ICD-10-CM | POA: Insufficient documentation

## 2017-10-15 DIAGNOSIS — I10 Essential (primary) hypertension: Secondary | ICD-10-CM | POA: Diagnosis not present

## 2017-10-15 DIAGNOSIS — I4811 Longstanding persistent atrial fibrillation: Secondary | ICD-10-CM

## 2017-10-15 DIAGNOSIS — E669 Obesity, unspecified: Secondary | ICD-10-CM | POA: Diagnosis not present

## 2017-10-15 HISTORY — DX: Longstanding persistent atrial fibrillation: I48.11

## 2017-10-15 LAB — BASIC METABOLIC PANEL
BUN/Creatinine Ratio: 16 (ref 12–28)
BUN: 38 mg/dL — ABNORMAL HIGH (ref 8–27)
CALCIUM: 9.5 mg/dL (ref 8.7–10.3)
CHLORIDE: 101 mmol/L (ref 96–106)
CO2: 19 mmol/L — AB (ref 20–29)
Creatinine, Ser: 2.33 mg/dL — ABNORMAL HIGH (ref 0.57–1.00)
GFR calc Af Amer: 24 mL/min/{1.73_m2} — ABNORMAL LOW (ref >59)
GFR calc non Af Amer: 21 mL/min/{1.73_m2} — ABNORMAL LOW (ref >59)
Glucose: 143 mg/dL — ABNORMAL HIGH (ref 65–99)
POTASSIUM: 5.1 mmol/L (ref 3.5–5.2)
SODIUM: 136 mmol/L (ref 134–144)

## 2017-10-15 NOTE — Progress Notes (Signed)
Primary Care Physician: Hulan Fess, MD Primary Cardiologist: Cathie Olden Referring Physician: Laurine Blazer is a 67 y.o. female with a history of longstanding persistent atrial fibrillation who presents for consultation in the [facility].  The patient was initially diagnosed with atrial fibrillation in 2017 incidentally at office visit with Dr Fletcher Anon. She was asymptomatic.  She was started on [facility] and underwent DCCV in 2018.  She reverted to AF a couple of weeks later and again was asymptomatic.  She has recently developed worsening LE edema and was started on Torsemide.  She was referred to the AF clinic today for evaluation of Tikosyn initiation.  Since being started on Torsemide, she has lost 12 pounds. LE edema is much improved.  Energy level is stable.  She is not sure that she was symptomatically improved after cardioversion.  She continues to smoke. She also has dietary indiscretion with sodium intake.    Today, she denies symptoms of palpitations, chest pain, shortness of breath, orthopnea, PND, lower extremity edema, dizziness, presyncope, syncope, snoring, daytime somnolence, bleeding, or neurologic sequela. The patient is tolerating medications without difficulties and is otherwise without complaint today.    Atrial Fibrillation Risk Factors:  she does not have symptoms or diagnosis of sleep apnea.  she does not have a history of rheumatic fever.  she has a BMI of Body mass index is 36.05 kg/m.Marland Kitchen Filed Weights   10/15/17 0951  Weight: 214 lb (97.1 kg)    LA size: 50   Atrial Fibrillation Management history:  Previous antiarrhythmic drugs: none  Previous cardioversions: 2018  Previous ablations: none  CHADS2VASC score: 4  Anticoagulation history: Eliquis   Past Medical History:  Diagnosis Date  . Anemia   . COPD (chronic obstructive pulmonary disease) ([place])   . Dyslipidemia   . Exogenous obesity   . GERD  (gastroesophageal reflux disease)   . GI bleed 05/14/2016  . History of abnormal cells from cervix   . Hyperlipidemia   . Hypertension   . Iron deficiency anemia   . Longstanding persistent atrial fibrillation ([place])   . Microalbuminuria   . Tobacco dependence   . Type II diabetes mellitus ([place])    Past Surgical History:  Procedure Laterality Date  . CARDIOVERSION N/A 08/07/2016   Procedure: CARDIOVERSION;  Surgeon: Thayer Headings, MD;  Location: [place];  Service: Cardiovascular;  Laterality: N/A;  . DIAGNOSTIC MAMMOGRAM  05/2015  . ESOPHAGOGASTRODUODENOSCOPY N/A 05/15/2016   Procedure: ESOPHAGOGASTRODUODENOSCOPY (EGD);  Surgeon: Wonda Horner, MD;  Location: [facility] ENDOSCOPY;  Service: Endoscopy;  Laterality: N/A;  . [place]   "I think they left my appendix"  . PAP SMEAR  2012 AND 2017    Current Outpatient Medications  Medication Sig Dispense Refill  . albuterol (PROVENTIL HFA;VENTOLIN HFA) 108 (90 Base) MCG/ACT inhaler Inhale 2 puffs into the lungs every 4 (four) hours as needed for wheezing or shortness of breath.    . allopurinol (ZYLOPRIM) 100 MG tablet Take 100 mg by mouth daily.    Marland Kitchen amLODipine (NORVASC) 10 MG tablet TAKE 1 TABLET(10 MG) BY MOUTH DAILY 90 tablet 0  . apixaban (ELIQUIS) 5 MG TABS tablet Take 1 tablet (5 mg total) by mouth 2 (two) times daily. 180 tablet 1  . atorvastatin (LIPITOR) 80 MG tablet Take 0.5 tablets (40 mg total) by mouth daily. 90 tablet 3  . Cholecalciferol (VITAMIN D3) 50000 units TABS Take 50,000 Units by mouth every 7 (seven)  days. Tuesday    . Cyanocobalamin (VITAMIN B-12 IJ) Inject as directed every 30 (thirty) days. As directed     . escitalopram (LEXAPRO) 10 MG tablet Take 1 tablet by mouth daily.  12  . ferrous sulfate 325 (65 FE) MG tablet Take 325 mg by mouth daily.    Marland Kitchen HYDROcodone-acetaminophen (NORCO/VICODIN) 5-325 MG tablet Take 1 tablet by mouth every 6 (six) hours as needed for moderate pain.    Marland Kitchen insulin  NPH-regular Human (NOVOLIN 70/30) (70-30) 100 UNIT/ML injection Inject into the skin. INJECT 35 UNITS IN MORNINGS AND 60 UNITS IN EVENINGS    . Insulin Syringe-Needle U-100 (B-D INS SYR ULTRAFINE 1CC/31G) 31G X 5/16" 1 ML MISC by Does not apply route.    . loratadine (CLARITIN) 10 MG tablet Take 10 mg by mouth daily.    Marland Kitchen losartan (COZAAR) 50 MG tablet Take 1 tablet by mouth daily.  12  . metFORMIN (GLUCOPHAGE) 500 MG tablet TAKE 2 TABLETS BY MOUTH EVERY MORNING AND 3 TABLETS BY MOUTH EVERY EVENING    . pantoprazole (PROTONIX) 40 MG tablet Take 1 tablet (40 mg total) by mouth daily. 60 tablet 0  . SPIRIVA HANDIHALER 18 MCG inhalation capsule Take 1 puff by mouth daily.    Marland Kitchen torsemide (DEMADEX) 20 MG tablet Take 1 tablet (20 mg total) by mouth daily. 90 tablet 3  . TRIAMCINOLONE ACETONIDE EX Apply 1 application topically every morning.     . triamterene-hydrochlorothiazide (MAXZIDE) 75-50 MG tablet Take 1 tablet by mouth daily.     No current facility-administered medications for this encounter.     Allergies  Allergen Reactions  . Nsaids Swelling  . Penicillins Hives    Social History   Socioeconomic History  . Marital status: Single    Spouse name: Not on file  . Number of children: Not on file  . Years of education: Not on file  . Highest education level: Not on file  Occupational History  . Not on file  Social Needs  . Financial resource strain: Not on file  . Food insecurity:    Worry: Not on file    Inability: Not on file  . Transportation needs:    Medical: Not on file    Non-medical: Not on file  Tobacco Use  . Smoking status: Current Every Day Smoker    Packs/day: 0.50    Years: 40.00    Pack years: 20.00    Types: Cigarettes  . Smokeless tobacco: Never Used  Substance and Sexual Activity  . Alcohol use: Yes    Comment: 05/14/2016 "I'll have a drink once/year; New Year's"  . Drug use: No  . Sexual activity: Never  Lifestyle  . Physical activity:    Days per  week: Not on file    Minutes per session: Not on file  . Stress: Not on file  Relationships  . Social connections:    Talks on phone: Not on file    Gets together: Not on file    Attends religious service: Not on file    Active member of club or organization: Not on file    Attends meetings of clubs or organizations: Not on file    Relationship status: Not on file  . Intimate partner violence:    Fear of current or ex partner: Not on file    Emotionally abused: Not on file    Physically abused: Not on file    Forced sexual activity: Not on file  Other Topics  Concern  . Not on file  Social History Narrative  . Not on file    Family History  Problem Relation Age of Onset  . Cancer Mother        BREAST  . Hypertension Mother   . Macular degeneration Mother   . Heart attack Father   . Diabetes Father   . Colon polyps Brother   . Colon polyps Brother   . Colon polyps Brother   . Colon polyps Brother   . Colon polyps Brother   . Colon polyps Brother    The patient does not have a history of early familial atrial fibrillation or other arrhythmias.  ROS- All systems are reviewed and negative except as per the HPI above.  Physical Exam: Vitals:   10/15/17 0951  BP: (!) 142/64  Pulse: 88  Weight: 214 lb (97.1 kg)  Height: 5' 4.6" (1.641 m)    GEN- The patient is well appearing, alert and oriented x 3 today.   Head- normocephalic, atraumatic Eyes-  Sclera clear, conjunctiva pink Ears- hearing intact Oropharynx- clear Neck- supple  Lungs- Clear to ausculation bilaterally, normal work of breathing Heart- Regular rate and rhythm, no murmurs, rubs or gallops  GI- soft, NT, ND, + BS Extremities- no clubbing, cyanosis, or edema MS- no significant deformity or atrophy Skin- no rash or lesion Psych- euthymic mood, full affect Neuro- strength and sensation are intact  Wt Readings from Last 3 Encounters:  10/15/17 214 lb (97.1 kg)  09/17/17 226 lb 1.9 oz (102.6 kg)    03/31/17 211 lb (95.7 kg)    EKG today demonstrates rate controlled atrial fibrillation, rate 88, QRS 29msec, QTc 468msec Echo 08/2017 demonstrated EF 50-55%, no RWMA, LA 50  Epic records are reviewed at length today  Assessment and Plan:  1. Longstanding persistent atrial fibrillation The patient has asymptomatic longstanding persistent atrial fibrillation.  Continue Eliquis for CHADS2VASC of 4 She has severe LAE by last echo. With CKD, she is not a candidate for Tikosyn or Sotalol. I do not think Flecainide or Multaq would be effective.  Amiodarone is our only realistic AAD option.  Without clear symptoms I think risks outweigh benefits. We discussed today that best treatment for AF is lifestyle modification.   2. Obesity Body mass index is 36.05 kg/m. Lifestyle modification was discussed at length including regular exercise and weight reduction. She states "I'm 67 years old, I've been big all my life". She declined free visit with nutritionist to review diet.   3. Chronic diastolic heart failure Improved with Torsemide BMET drawn this morning at [facility] [facility] diet reviewed - she has significant dietary indiscretion  We discussed today that with dietary changes, blood pressure and edema could improve She understands but is unsure she can do as "she craves salt"  4.  Tobacco abuse Cessation advised She states she "can't quit because of nerves"  Follow up with Dr Acie Fredrickson as scheduled, AF clinic as needed    Chanetta Marshall, NP 10/15/2017 10:22 AM

## 2017-10-16 ENCOUNTER — Telehealth: Payer: Self-pay | Admitting: Cardiovascular Disease

## 2017-10-16 NOTE — Telephone Encounter (Signed)
Notes recorded by Nahser, Wonda Cheng, MD on 10/15/2017 at 5:10 PM EDT She has progressive renal insufficiency - due to diabetes. Her potassium is currently normal but at some point , I think we will need to DC the losartan Lets DC losartan She will need to follow up with her nephrology. Will see her at her regularly scheduled appt   Notified the pt of lab results and recommendations per Dr Acie Fredrickson.  Advised the pt that given her K is normal, he wants her to go ahead and discontinue taking losartan.  D/C'ed this out of the pts med list. Also advised her to call her Nephrologist today, to arrange a follow-up appt with them, for progressive renal insufficiency, due to DM. Also informed the pt that Dr Acie Fredrickson will see her as planned for 01/05/18 at [place].  Pt verbalized understanding and agrees with this plan.

## 2017-10-16 NOTE — Telephone Encounter (Signed)
New Message:       Sharyn Lull told pt to call back this morning and ask for triage to give her results

## 2017-11-20 ENCOUNTER — Other Ambulatory Visit: Payer: Self-pay | Admitting: Cardiovascular Disease

## 2017-12-10 ENCOUNTER — Other Ambulatory Visit: Payer: Self-pay

## 2017-12-10 DIAGNOSIS — I739 Peripheral vascular disease, unspecified: Secondary | ICD-10-CM

## 2017-12-11 ENCOUNTER — Encounter: Payer: Self-pay | Admitting: Cardiovascular Disease

## 2017-12-23 ENCOUNTER — Ambulatory Visit: Payer: 59 | Admitting: Vascular Surgery

## 2017-12-23 ENCOUNTER — Ambulatory Visit (HOSPITAL_COMMUNITY)
Admission: RE | Admit: 2017-12-23 | Discharge: 2017-12-23 | Disposition: A | Discharge status: Home / Self Care | Payer: 59 | Source: Ambulatory Visit | Attending: Vascular Surgery | Admitting: Vascular Surgery

## 2017-12-23 ENCOUNTER — Other Ambulatory Visit: Payer: Self-pay

## 2017-12-23 ENCOUNTER — Encounter: Payer: Self-pay | Admitting: Vascular Surgery

## 2017-12-23 DIAGNOSIS — I739 Peripheral vascular disease, unspecified: Secondary | ICD-10-CM

## 2017-12-23 DIAGNOSIS — E785 Hyperlipidemia, unspecified: Secondary | ICD-10-CM | POA: Diagnosis not present

## 2017-12-23 DIAGNOSIS — E1151 Type 2 diabetes mellitus with diabetic peripheral angiopathy without gangrene: Secondary | ICD-10-CM | POA: Diagnosis not present

## 2017-12-23 DIAGNOSIS — I10 Essential (primary) hypertension: Secondary | ICD-10-CM | POA: Diagnosis not present

## 2017-12-23 NOTE — Progress Notes (Signed)
Vitals:   12/23/17 1341  BP: (!) 142/74  Pulse: (!) 106  Resp: 16  Temp: 98.5 F (36.9 C)  TempSrc: Oral  SpO2: 100%  Weight: 218 lb (98.9 kg)  Height: 5\' 4"  (1.626 m)

## 2017-12-23 NOTE — Progress Notes (Signed)
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 female with history of COPD, atrial fibrillation on Xarelto, hypertension, hyperlipidemia, diabetes that presents for evaluation of claudication.  Patient states she has had pain in both of her calves for greater than 6 months.  Pain is aching in thigh and calf.  She works in Insurance underwriter at [employer] and particularly has calf pain when walking into work.  She states she is able to make it from where she parks her car to her desk without stopping.  She denies any pain at night that keeps her awake.  She has no overt tissue loss on her feet this time.  She states she said no prior lower extremity revascularization procedures.  She does smoke and is smoking about 10 cigarettes a day.  Regarding her diabetes she says it is moderately controlled.  Past Medical History:  Diagnosis Date  . Anemia   . COPD (chronic obstructive pulmonary disease) ([place])   . Dyslipidemia   . Exogenous obesity   . GERD (gastroesophageal reflux disease)   . GI bleed 05/14/2016  . History of abnormal cells from cervix   . Hyperlipidemia   . Hypertension   . Iron deficiency anemia   . Longstanding persistent atrial fibrillation ([place])   . Microalbuminuria   . Tobacco dependence   . Type II diabetes mellitus ([place])     Past Surgical History:  Procedure Laterality Date  . CARDIOVERSION N/A 08/07/2016   Procedure: CARDIOVERSION;  Surgeon: Thayer Headings, MD;  Location: [place];  Service: Cardiovascular;  Laterality: N/A;  . DIAGNOSTIC MAMMOGRAM  05/2015  . ESOPHAGOGASTRODUODENOSCOPY N/A 05/15/2016   Procedure: ESOPHAGOGASTRODUODENOSCOPY (EGD);  Surgeon: Wonda Horner, MD;  Location: [facility] ENDOSCOPY;  Service: Endoscopy;  Laterality: N/A;  . [place]   "I think they left my appendix"  . PAP SMEAR  2012 AND  2017    Family History  Problem Relation Age of Onset  . Cancer Mother        BREAST  . Hypertension Mother   . Macular degeneration Mother   . Heart attack Father   . Diabetes Father   . Colon polyps Brother   . Colon polyps Brother   . Colon polyps Brother   . Colon polyps Brother   . Colon polyps Brother   . Colon polyps Brother     SOCIAL HISTORY: Social History   Socioeconomic History  . Marital status: Single    Spouse name: Not on file  . Number of children: Not on file  . Years of education: Not on file  . Highest education level: Not on file  Occupational History  . Not on file  Social Needs  . Financial resource strain: Not on file  . Food insecurity:    Worry: Not on file    Inability: Not on file  . Transportation needs:    Medical: Not on file    Non-medical: Not on file  Tobacco Use  . Smoking status: Current Every Day Smoker    Packs/day: 0.50    Years: 40.00    Pack years: 20.00    Types: Cigarettes  . Smokeless tobacco: Never Used  Substance and Sexual Activity  . Alcohol use: Yes    Comment: 05/14/2016 "I'll have a drink once/year; New Year's"  . Drug use: No  .  Sexual activity: Never  Lifestyle  . Physical activity:    Days per week: Not on file    Minutes per session: Not on file  . Stress: Not on file  Relationships  . Social connections:    Talks on phone: Not on file    Gets together: Not on file    Attends religious service: Not on file    Active member of club or organization: Not on file    Attends meetings of clubs or organizations: Not on file    Relationship status: Not on file  . Intimate partner violence:    Fear of current or ex partner: Not on file    Emotionally abused: Not on file    Physically abused: Not on file    Forced sexual activity: Not on file  Other Topics Concern  . Not on file  Social History Narrative  . Not on file    Allergies  Allergen Reactions  . Nsaids Swelling  . Penicillins Hives     Current Outpatient Medications  Medication Sig Dispense Refill  . albuterol (PROVENTIL HFA;VENTOLIN HFA) 108 (90 Base) MCG/ACT inhaler Inhale 2 puffs into the lungs every 4 (four) hours as needed for wheezing or shortness of breath.    Marland Kitchen amLODipine (NORVASC) 10 MG tablet TAKE 1 TABLET(10 MG) BY MOUTH DAILY 90 tablet 0  . atorvastatin (LIPITOR) 80 MG tablet Take 0.5 tablets (40 mg total) by mouth daily. 90 tablet 3  . Cyanocobalamin (VITAMIN B-12 IJ) Inject as directed every 30 (thirty) days. As directed     . ELIQUIS 5 MG TABS tablet TAKE 1 TABLET(5 MG) BY MOUTH TWICE DAILY 180 tablet 1  . escitalopram (LEXAPRO) 10 MG tablet Take 1 tablet by mouth daily.  12  . HYDROcodone-acetaminophen (NORCO/VICODIN) 5-325 MG tablet Take 1 tablet by mouth every 6 (six) hours as needed for moderate pain.    Marland Kitchen insulin NPH-regular Human (NOVOLIN 70/30) (70-30) 100 UNIT/ML injection Inject into the skin. INJECT 35 UNITS IN MORNINGS AND 60 UNITS IN EVENINGS    . Insulin Syringe-Needle U-100 (B-D INS SYR ULTRAFINE 1CC/31G) 31G X 5/16" 1 ML MISC by Does not apply route.    . loratadine (CLARITIN) 10 MG tablet Take 10 mg by mouth daily.    Marland Kitchen losartan (COZAAR) 50 MG tablet TK 1 T PO QD  12  . metFORMIN (GLUCOPHAGE) 500 MG tablet TAKE 2 TABLETS BY MOUTH EVERY MORNING AND 3 TABLETS BY MOUTH EVERY EVENING    . pantoprazole (PROTONIX) 40 MG tablet Take 1 tablet (40 mg total) by mouth daily. 60 tablet 0  . SPIRIVA HANDIHALER 18 MCG inhalation capsule Take 1 puff by mouth daily.    Marland Kitchen torsemide (DEMADEX) 20 MG tablet Take 1 tablet (20 mg total) by mouth daily. 90 tablet 3  . TRIAMCINOLONE ACETONIDE EX Apply 1 application topically every morning.     . triamterene-hydrochlorothiazide (MAXZIDE) 75-50 MG tablet Take 1 tablet by mouth daily.    Marland Kitchen allopurinol (ZYLOPRIM) 100 MG tablet Take 100 mg by mouth daily.    . Cholecalciferol (VITAMIN D3) 50000 units TABS Take 50,000 Units by mouth every 7 (seven) days. Tuesday    .  cyclobenzaprine (FLEXERIL) 10 MG tablet TK 1 T PO TID PRN  0  . ferrous sulfate 325 (65 FE) MG tablet Take 325 mg by mouth daily.    . predniSONE (DELTASONE) 10 MG tablet   0  . predniSONE (DELTASONE) 20 MG tablet   0  No current facility-administered medications for this visit.     REVIEW OF SYSTEMS:  [X]  denotes positive finding, [ ]  denotes negative finding Cardiac  Comments:  Chest pain or chest pressure:    Shortness of breath upon exertion: x   Short of breath when lying flat:    Irregular heart rhythm:        Vascular    Pain in calf, thigh, or hip brought on by ambulation: x   Pain in feet at night that wakes you up from your sleep:     Blood clot in your veins:    Leg swelling:         Pulmonary    Oxygen at home:    Productive cough:     Wheezing:         Neurologic    Sudden weakness in arms or legs:     Sudden numbness in arms or legs:     Sudden onset of difficulty speaking or slurred speech:    Temporary loss of vision in one eye:     Problems with dizziness:         Gastrointestinal    Blood in stool:     Vomited blood:         Genitourinary    Burning when urinating:     Blood in urine:        Psychiatric    Major depression:         Hematologic    Bleeding problems:    Problems with blood clotting too easily:        Skin    Rashes or ulcers:        Constitutional    Fever or chills:      PHYSICAL EXAM: Vitals:   12/23/17 1341 12/23/17 1349  BP: (!) 142/74 (!) 141/67  Pulse: (!) 106 (!) 106  Resp: 16   Temp: 98.5 F (36.9 C)   TempSrc: Oral   SpO2: 100%   Weight: 98.9 kg   Height: 5\' 4"  (1.626 m)     GENERAL: The patient is a well-nourished female, in no acute distress. The vital signs are documented above. CARDIAC: There is a regular rate and rhythm.  VASCULAR:  2+ radial pulse palpable bilateral upper extremities 2+ femoral pulse palpable bilateral groins. No palpable popliteal pulses. Monophasic dorsalis pedis and  posterior tibial signals in the bilateral feet.  No overt tissue loss. PULMONARY: There is good air exchange bilaterally without wheezing or rales. ABDOMEN: Soft and non-tender with normal pitched bowel sounds.  MUSCULOSKELETAL: There are no major deformities or cyanosis. NEUROLOGIC: No focal weakness or paresthesias are detected. SKIN: There are no ulcers or rashes noted. PSYCHIATRIC: The patient has a normal affect.  DATA:   I reviewed her noninvasive imaging and she has an ABI of 0.4 on the right and 0.5 on the left with monophasic waveforms at the ankle.  Her right great toe pressure was very low amplitude and not measured in her left great toe pressure was 49.  Assessment/Plan:  Discussed with Ms. Bencivenga her noninvasive imaging that shows significantly reduced ABIs of 0.4/0.5 bilaterally.  Currently her symptoms are consistent with claudication and she has been able to at least perform all of her ADLs at this time.  I discussed that we typically reserve surgical intervention for folks that have critical limb ischemia with either rest pain or tissue loss.  I feel based on her current symptoms she would best be managed medically.  She denies rest pain and has no wound.  We discussed adding aspirin back along with her statin that she is already taking.  We discussed that she needs to quit smoking since her peripheral arterial disease is largely worsened by her tobacco abuse.  We discussed a exercise regimen with walking to try to improve her walking distances at least 3x/week.  I will also give her a prescription for Pletal but I advised her not to have this filled until she meets with her cardiologist in a couple of weeks given documentation of some diastolic heart heart failure.  If her cardiologist is ok with pletal this may improve her walking distances.  I will plan to see her back in 6 months with ABIs to monitor her symptoms and her overall progress unless things worsen between now and  then.   Marty Heck, MD [facility] Office: [phone] Pager: [place]

## 2017-12-24 ENCOUNTER — Other Ambulatory Visit: Payer: Self-pay

## 2017-12-24 DIAGNOSIS — I739 Peripheral vascular disease, unspecified: Secondary | ICD-10-CM

## 2017-12-25 ENCOUNTER — Encounter: Payer: Self-pay | Admitting: Family Medicine

## 2018-01-05 ENCOUNTER — Ambulatory Visit: Payer: 59 | Admitting: Cardiovascular Disease

## 2018-01-05 ENCOUNTER — Encounter: Payer: Self-pay | Admitting: Cardiovascular Disease

## 2018-01-05 VITALS — BP 134/70 | HR 68 | Ht 64.0 in | Wt 225.4 lb

## 2018-01-05 DIAGNOSIS — I482 Chronic atrial fibrillation, unspecified: Secondary | ICD-10-CM

## 2018-01-05 DIAGNOSIS — E782 Mixed hyperlipidemia: Secondary | ICD-10-CM

## 2018-01-05 DIAGNOSIS — I10 Essential (primary) hypertension: Secondary | ICD-10-CM | POA: Diagnosis not present

## 2018-01-05 NOTE — Patient Instructions (Addendum)
Medication Instructions:  Your physician recommends that you continue on your current medications as directed. Please refer to the Current Medication list given to you today.   Labwork: Your physician recommends that you return for lab work in: 6 months on the day of or a few days before your office visit You will need to FAST for this appointment - nothing to eat or drink after midnight the night before except water.    Testing/Procedures: None Ordered   Follow-Up: Your physician wants you to follow-up in: 6 months with a Nurse Practitioner or PA on Dr. Elmarie Shiley team. You will receive a reminder letter in the mail two months in advance. If you don't receive a letter, please call our office to schedule the follow-up appointment.   If you need a refill on your cardiac medications before your next appointment, please call your pharmacy.   Thank you for choosing [facility]! Christen Bame, RN [phone]

## 2018-01-05 NOTE — Progress Notes (Signed)
Cardiology Office Note   Date:  01/05/2018   ID:  Paula Wheeler, DOB [birth year], MRN [id number]  PCP:  Hulan Fess, MD  Cardiologist:   Mertie Moores, MD   Chief Complaint  Patient presents with  . Atrial Fibrillation   Problem list 1. Essential HTN  2. Hyperlipidemia 3. Cardiomegaly on x-ray 4. Diabetes mellitus 5. Obesity  6. Atrial fib -CHADS2VASC score is 72 ( female, age 67, HTN, PVD)     Nov. 13, 2017: Paula Wheeler is a 67 y.o. female who presents for evaluation of cardiomegaly She requested a CXR to look for lung issues ( is a current smoker) was found have cardiac megaly.  Her main complaint is that her legs give out when she is walking. She does not get any regular exercise.  No CP.   Some shortness of breath with exertion  Has DM,   She is getting better control of her glucose levels recently .    She admits that she eats more salt that she probably should. She's canned soup was on occasions. She put salt on her apples.  Feb. 12, 2018:  Pt was seen by Dr. Fletcher Anon for PVD. Was found to have atrial fib.   CHADS2VASC score is 65 ( female, age 74, HTN, PVD)  Started on Eliquis. Developed a GI bleed.  EGD shows erosive gastropathy.   She was told to stop her Goodies powders and Indomethicin ( which she was taking for gout )  She has seen Dr. Oletta Lamas -  She decided to reduce her Eliquis to 5 mg a day .    July 25, 2016:  Pt is seen back for follow Up of her atrial fibrillation. She was started on L course but she developed a GI bleed. She had been taking lots of goodies powders and indomethacin for gout.  July 25, 2016:  No symptoms related to atrial fib. Still smoking.  Her hands are cold at night  BP is up today .  Her Quinipril was reduced to once a day when she was in the hospital in jan. 2018.   October 28, 2016:  Was cardioverted on April 11 Unfortunately, is back in atrial fib She cannot tell that her HR is irreg.  Remains on Eliquis    Echo from 12. /17 showed normal Lv systolic function   Still smoking   04/15/17:  Mother died recently . No CP or dyspnea  Hx of atria fib   Sep 17, 2017:  Paula Wheeler is seen for follow up of atrial fib  Is having difficulty breathing and her legs are swollen  Dyspnea for the past 3 weeks.  Not associated with any CP Has cut back on her smoking  Wt today is 226.  ( up 15 lbs since Dec. 2018)  Has lack of energy . Seems to feel better when she is on prednisone   She is in persistent atrial fibrillation.  We cardioverted her about a year ago but it only lasted for several weeks.  We discussed going to the A. fib clinic for consideration of Tikosyn therapy.  January 05, 2018: Seen today for follow-up of her atrial fibrillation, hypertension hyperlipidemia. Was started on Pletal - has not started it until she cleared it with me  Still smoking  Previously, we had her on losartan but it affected her renal function.  We also had her on some amlodipine but this caused significant leg edema.  She has stopped both of  these medications and her blood pressure remains well controlled.   Past Medical History:  Diagnosis Date  . Anemia   . COPD (chronic obstructive pulmonary disease) ([place])   . Dyslipidemia   . Exogenous obesity   . GERD (gastroesophageal reflux disease)   . GI bleed 05/14/2016  . History of abnormal cells from cervix   . Hyperlipidemia   . Hypertension   . Iron deficiency anemia   . Longstanding persistent atrial fibrillation ([place])   . Microalbuminuria   . Tobacco dependence   . Type II diabetes mellitus ([place])     Past Surgical History:  Procedure Laterality Date  . CARDIOVERSION N/A 08/07/2016   Procedure: CARDIOVERSION;  Surgeon: Thayer Headings, MD;  Location: [facility];  Service: Cardiovascular;  Laterality: N/A;  . DIAGNOSTIC MAMMOGRAM  05/2015  . ESOPHAGOGASTRODUODENOSCOPY N/A 05/15/2016   Procedure: ESOPHAGOGASTRODUODENOSCOPY (EGD);  Surgeon:  Wonda Horner, MD;  Location: [facility] ENDOSCOPY;  Service: Endoscopy;  Laterality: N/A;  . [place]   "I think they left my appendix"  . PAP SMEAR  2012 AND 2017     Current Outpatient Medications  Medication Sig Dispense Refill  . albuterol (PROVENTIL HFA;VENTOLIN HFA) 108 (90 Base) MCG/ACT inhaler Inhale 2 puffs into the lungs every 4 (four) hours as needed for wheezing or shortness of breath.    . allopurinol (ZYLOPRIM) 100 MG tablet Take 100 mg by mouth daily.    Marland Kitchen atorvastatin (LIPITOR) 80 MG tablet Take 0.5 tablets (40 mg total) by mouth daily. 90 tablet 3  . cyclobenzaprine (FLEXERIL) 10 MG tablet TK 1 T PO TID PRN  0  . ELIQUIS 5 MG TABS tablet TAKE 1 TABLET(5 MG) BY MOUTH TWICE DAILY 180 tablet 1  . escitalopram (LEXAPRO) 10 MG tablet Take 1 tablet by mouth daily.  12  . ferrous sulfate 325 (65 FE) MG tablet Take 325 mg by mouth daily.    Marland Kitchen HYDROcodone-acetaminophen (NORCO/VICODIN) 5-325 MG tablet Take 1 tablet by mouth every 6 (six) hours as needed for moderate pain.    Marland Kitchen insulin NPH-regular Human (NOVOLIN 70/30) (70-30) 100 UNIT/ML injection Inject into the skin. INJECT 35 UNITS IN MORNINGS AND 60 UNITS IN EVENINGS    . Insulin Syringe-Needle U-100 (B-D INS SYR ULTRAFINE 1CC/31G) 31G X 5/16" 1 ML MISC by Does not apply route.    . loratadine (CLARITIN) 10 MG tablet Take 10 mg by mouth daily.    . metFORMIN (GLUCOPHAGE) 500 MG tablet TAKE 2 TABLETS BY MOUTH EVERY MORNING AND 3 TABLETS BY MOUTH EVERY EVENING    . pantoprazole (PROTONIX) 40 MG tablet Take 1 tablet (40 mg total) by mouth daily. 60 tablet 0  . predniSONE (DELTASONE) 10 MG tablet   0  . SPIRIVA HANDIHALER 18 MCG inhalation capsule Take 1 puff by mouth daily.    Marland Kitchen torsemide (DEMADEX) 20 MG tablet Take 1 tablet (20 mg total) by mouth daily. 90 tablet 3  . TRIAMCINOLONE ACETONIDE EX Apply 1 application topically every morning.     . triamterene-hydrochlorothiazide (MAXZIDE) 75-50 MG tablet Take 1 tablet by  mouth daily.    . Cyanocobalamin (VITAMIN B-12 IJ) Inject as directed every 30 (thirty) days. As directed      No current facility-administered medications for this visit.     Allergies:   Nsaids and Penicillins    Social History:  The patient  reports that she has been smoking cigarettes. She has a 20.00 pack-year smoking history. She has never used  smokeless tobacco. She reports that she drinks alcohol. She reports that she does not use drugs.   Family History:  The patient's family history includes Cancer in her mother; Colon polyps in her brother, brother, brother, brother, brother, and brother; Diabetes in her father; Heart attack in her father; Hypertension in her mother; Macular degeneration in her mother.    ROS:  Please see the history of present illness.   Physical Exam: Blood pressure 134/70, pulse 68, height 5\' 4"  (1.626 m), weight 225 lb 6.4 oz (102.2 kg), SpO2 99 %.  GEN:  Well nourished, well developed in no acute distress HEENT: Normal NECK: No JVD; No carotid bruits LYMPHATICS: No lymphadenopathy CARDIAC: Irreg. Irreg.  RESPIRATORY:  Clear to auscultation without rales, wheezing or rhonchi  ABDOMEN: Soft, non-tender, non-distended MUSCULOSKELETAL:  No edema; No deformity  SKIN: Warm and dry NEUROLOGIC:  Alert and oriented x 3    EKG:       Recent Labs: 03/31/2017: Hemoglobin 10.4; Platelets 355 09/17/2017: ALT 7 10/15/2017: BUN 38; Creatinine, Ser 2.33; Potassium 5.1; Sodium 136    Lipid Panel    Component Value Date/Time   CHOL 149 09/17/2017 1155   TRIG 157 (H) 09/17/2017 1155   HDL 33 (L) 09/17/2017 1155   CHOLHDL 4.5 (H) 09/17/2017 1155   LDLCALC 85 09/17/2017 1155      Wt Readings from Last 3 Encounters:  01/05/18 225 lb 6.4 oz (102.2 kg)  12/23/17 218 lb (98.9 kg)  10/15/17 214 lb (97.1 kg)      Other studies Reviewed: Additional studies/ records that were reviewed today include: . Review of the above records demonstrates:     ASSESSMENT AND PLAN:  1.   Atrial fib : Persistent atrial fibrillation.  Continue on Eliquis.   Seems to be very stable.  Continue current medications.  2.   acute on chronic diastolic congestive heart failure:   Leg swelling is much better off the amlodipine.    2. Essential hypertension: Pressure is well controlled.  3. Claudication:   Is been seen by the vein and vascular surgery.3 Strongly advised her to stop smoking.  4. Hyperlipidemia:     Labs look great.  We will check her fasting labs at her next office visit.  5.  Chronic kidney disease:      Current medicines are reviewed at length with the patient today.  The patient does not have concerns regarding medicines.  Labs/ tests ordered today include:   Orders Placed This Encounter  Procedures  . Lipid Profile  . Basic Metabolic Panel (BMET)  . Hepatic function panel     Mertie Moores, MD  01/05/2018 8:47 AM    [facility] [place], [place], [place]  [address] Phone: [phone]; Fax: [phone]

## 2018-01-29 ENCOUNTER — Observation Stay (HOSPITAL_COMMUNITY)
Admission: EM | Admit: 2018-01-29 | Discharge: 2018-01-30 | Disposition: A | Discharge status: Home / Self Care | Payer: 59 | Attending: Internal Medicine | Admitting: Internal Medicine

## 2018-01-29 ENCOUNTER — Encounter (HOSPITAL_COMMUNITY): Payer: Self-pay | Admitting: Emergency Medicine

## 2018-01-29 ENCOUNTER — Emergency Department (HOSPITAL_COMMUNITY): Payer: 59

## 2018-01-29 DIAGNOSIS — I10 Essential (primary) hypertension: Secondary | ICD-10-CM | POA: Diagnosis not present

## 2018-01-29 DIAGNOSIS — I4819 Other persistent atrial fibrillation: Secondary | ICD-10-CM | POA: Diagnosis present

## 2018-01-29 DIAGNOSIS — F32A Depression, unspecified: Secondary | ICD-10-CM | POA: Diagnosis present

## 2018-01-29 DIAGNOSIS — I130 Hypertensive heart and chronic kidney disease with heart failure and stage 1 through stage 4 chronic kidney disease, or unspecified chronic kidney disease: Secondary | ICD-10-CM | POA: Diagnosis not present

## 2018-01-29 DIAGNOSIS — N184 Chronic kidney disease, stage 4 (severe): Secondary | ICD-10-CM | POA: Diagnosis present

## 2018-01-29 DIAGNOSIS — E1129 Type 2 diabetes mellitus with other diabetic kidney complication: Secondary | ICD-10-CM | POA: Diagnosis present

## 2018-01-29 DIAGNOSIS — R7989 Other specified abnormal findings of blood chemistry: Secondary | ICD-10-CM | POA: Diagnosis present

## 2018-01-29 DIAGNOSIS — D631 Anemia in chronic kidney disease: Secondary | ICD-10-CM | POA: Diagnosis not present

## 2018-01-29 DIAGNOSIS — R0602 Shortness of breath: Secondary | ICD-10-CM

## 2018-01-29 DIAGNOSIS — Z794 Long term (current) use of insulin: Secondary | ICD-10-CM | POA: Insufficient documentation

## 2018-01-29 DIAGNOSIS — D72829 Elevated white blood cell count, unspecified: Secondary | ICD-10-CM | POA: Diagnosis present

## 2018-01-29 DIAGNOSIS — Z880 Allergy status to penicillin: Secondary | ICD-10-CM | POA: Insufficient documentation

## 2018-01-29 DIAGNOSIS — Z720 Tobacco use: Secondary | ICD-10-CM | POA: Diagnosis present

## 2018-01-29 DIAGNOSIS — Z7901 Long term (current) use of anticoagulants: Secondary | ICD-10-CM | POA: Insufficient documentation

## 2018-01-29 DIAGNOSIS — R778 Other specified abnormalities of plasma proteins: Secondary | ICD-10-CM | POA: Diagnosis present

## 2018-01-29 DIAGNOSIS — E1151 Type 2 diabetes mellitus with diabetic peripheral angiopathy without gangrene: Secondary | ICD-10-CM | POA: Diagnosis not present

## 2018-01-29 DIAGNOSIS — N179 Acute kidney failure, unspecified: Secondary | ICD-10-CM | POA: Diagnosis not present

## 2018-01-29 DIAGNOSIS — F17210 Nicotine dependence, cigarettes, uncomplicated: Secondary | ICD-10-CM | POA: Insufficient documentation

## 2018-01-29 DIAGNOSIS — E785 Hyperlipidemia, unspecified: Secondary | ICD-10-CM | POA: Diagnosis not present

## 2018-01-29 DIAGNOSIS — F329 Major depressive disorder, single episode, unspecified: Secondary | ICD-10-CM | POA: Diagnosis not present

## 2018-01-29 DIAGNOSIS — I5032 Chronic diastolic (congestive) heart failure: Secondary | ICD-10-CM | POA: Diagnosis not present

## 2018-01-29 DIAGNOSIS — I4811 Longstanding persistent atrial fibrillation: Secondary | ICD-10-CM | POA: Insufficient documentation

## 2018-01-29 DIAGNOSIS — J449 Chronic obstructive pulmonary disease, unspecified: Secondary | ICD-10-CM | POA: Insufficient documentation

## 2018-01-29 DIAGNOSIS — D649 Anemia, unspecified: Secondary | ICD-10-CM | POA: Diagnosis present

## 2018-01-29 DIAGNOSIS — E1122 Type 2 diabetes mellitus with diabetic chronic kidney disease: Secondary | ICD-10-CM

## 2018-01-29 DIAGNOSIS — K219 Gastro-esophageal reflux disease without esophagitis: Secondary | ICD-10-CM | POA: Diagnosis present

## 2018-01-29 LAB — PROTIME-INR
INR: 1.6
Prothrombin Time: 18.9 seconds — ABNORMAL HIGH (ref 11.4–15.2)

## 2018-01-29 LAB — COMPREHENSIVE METABOLIC PANEL
ALT: 10 U/L (ref 0–44)
ANION GAP: 17 — AB (ref 5–15)
AST: 18 U/L (ref 15–41)
Albumin: 3.6 g/dL (ref 3.5–5.0)
Alkaline Phosphatase: 92 U/L (ref 38–126)
BUN: 38 mg/dL — AB (ref 8–23)
CHLORIDE: 103 mmol/L (ref 98–111)
CO2: 15 mmol/L — ABNORMAL LOW (ref 22–32)
Calcium: 9.4 mg/dL (ref 8.9–10.3)
Creatinine, Ser: 2.83 mg/dL — ABNORMAL HIGH (ref 0.44–1.00)
GFR calc Af Amer: 19 mL/min — ABNORMAL LOW (ref >60)
GFR, EST NON AFRICAN AMERICAN: 16 mL/min — AB (ref >60)
Glucose, Bld: 251 mg/dL — ABNORMAL HIGH (ref 70–99)
POTASSIUM: 5.1 mmol/L (ref 3.5–5.1)
Sodium: 135 mmol/L (ref 135–145)
Total Bilirubin: 0.2 mg/dL — ABNORMAL LOW (ref 0.3–1.2)
Total Protein: 6.2 g/dL — ABNORMAL LOW (ref 6.5–8.1)

## 2018-01-29 LAB — PREPARE RBC (CROSSMATCH)

## 2018-01-29 LAB — CBC
HCT: 20.8 % — ABNORMAL LOW (ref 36.0–46.0)
Hemoglobin: 5.8 g/dL — CL (ref 12.0–15.0)
MCH: 22.1 pg — ABNORMAL LOW (ref 26.0–34.0)
MCHC: 27.9 g/dL — AB (ref 30.0–36.0)
MCV: 79.1 fL (ref 78.0–100.0)
PLATELETS: 453 10*3/uL — AB (ref 150–400)
RBC: 2.63 MIL/uL — ABNORMAL LOW (ref 3.87–5.11)
RDW: 17.6 % — AB (ref 11.5–15.5)
WBC: 15.6 10*3/uL — AB (ref 4.0–10.5)

## 2018-01-29 LAB — TROPONIN I: Troponin I: 0.06 ng/mL (ref <0.03)

## 2018-01-29 LAB — POC OCCULT BLOOD, ED: Fecal Occult Bld: NEGATIVE

## 2018-01-29 LAB — GLUCOSE, CAPILLARY: Glucose-Capillary: 254 mg/dL — ABNORMAL HIGH (ref 70–99)

## 2018-01-29 MED ORDER — SODIUM CHLORIDE 0.9 % IV SOLN
8.0000 mg/h | INTRAVENOUS | Status: DC
  Administered 2018-01-29 – 2018-01-30 (×2): 8 mg/h via INTRAVENOUS
  Filled 2018-01-29 (×4): qty 80

## 2018-01-29 MED ORDER — ALLOPURINOL 100 MG PO TABS
100.0000 mg | ORAL_TABLET | Freq: Every day | ORAL | Status: DC
  Administered 2018-01-30: 100 mg via ORAL
  Filled 2018-01-29: qty 1

## 2018-01-29 MED ORDER — PANTOPRAZOLE SODIUM 40 MG IV SOLR
80.0000 mg | Freq: Once | INTRAVENOUS | Status: AC
  Administered 2018-01-29: 20:00:00 80 mg via INTRAVENOUS
  Filled 2018-01-29: qty 80

## 2018-01-29 MED ORDER — INSULIN ASPART PROT & ASPART (70-30 MIX) 100 UNIT/ML ~~LOC~~ SUSP: 40.0000 [IU] | Freq: Every day | SUBCUTANEOUS | Status: DC

## 2018-01-29 MED ORDER — ZOLPIDEM TARTRATE 5 MG PO TABS: 5.0000 mg | ORAL_TABLET | Freq: Every evening | ORAL | Status: DC | PRN

## 2018-01-29 MED ORDER — INSULIN ASPART 100 UNIT/ML ~~LOC~~ SOLN
0.0000 [IU] | Freq: Three times a day (TID) | SUBCUTANEOUS | Status: DC
  Administered 2018-01-30 (×2): 4 [IU] via SUBCUTANEOUS

## 2018-01-29 MED ORDER — TIOTROPIUM BROMIDE MONOHYDRATE 18 MCG IN CAPS: 18.0000 ug | ORAL_CAPSULE | Freq: Every day | RESPIRATORY_TRACT | Status: DC

## 2018-01-29 MED ORDER — MORPHINE SULFATE (PF) 2 MG/ML IV SOLN: 1.0000 mg | INTRAVENOUS | Status: DC | PRN

## 2018-01-29 MED ORDER — INSULIN ASPART PROT & ASPART (70-30 MIX) 100 UNIT/ML ~~LOC~~ SUSP
20.0000 [IU] | Freq: Every day | SUBCUTANEOUS | Status: DC
  Administered 2018-01-30: 20 [IU] via SUBCUTANEOUS
  Filled 2018-01-29: qty 10

## 2018-01-29 MED ORDER — IPRATROPIUM BROMIDE 0.02 % IN SOLN
0.5000 mg | RESPIRATORY_TRACT | Status: DC
  Administered 2018-01-29: 0.5 mg via RESPIRATORY_TRACT
  Filled 2018-01-29: qty 2.5

## 2018-01-29 MED ORDER — LORATADINE 10 MG PO TABS
10.0000 mg | ORAL_TABLET | Freq: Every day | ORAL | Status: DC
  Administered 2018-01-30: 10 mg via ORAL
  Filled 2018-01-29: qty 1

## 2018-01-29 MED ORDER — ONDANSETRON HCL 4 MG PO TABS: 4.0000 mg | ORAL_TABLET | Freq: Four times a day (QID) | ORAL | Status: DC | PRN

## 2018-01-29 MED ORDER — HYDROCODONE-ACETAMINOPHEN 5-325 MG PO TABS: 1.0000 | ORAL_TABLET | Freq: Four times a day (QID) | ORAL | Status: DC | PRN

## 2018-01-29 MED ORDER — HYDRALAZINE HCL 20 MG/ML IJ SOLN: 5.0000 mg | INTRAMUSCULAR | Status: DC | PRN

## 2018-01-29 MED ORDER — SODIUM CHLORIDE 0.9% IV SOLUTION: Freq: Once | INTRAVENOUS | Status: DC

## 2018-01-29 MED ORDER — ACETAMINOPHEN 650 MG RE SUPP: 650.0000 mg | Freq: Four times a day (QID) | RECTAL | Status: DC | PRN

## 2018-01-29 MED ORDER — ONDANSETRON HCL 4 MG/2ML IJ SOLN: 4.0000 mg | Freq: Four times a day (QID) | INTRAMUSCULAR | Status: DC | PRN

## 2018-01-29 MED ORDER — ATORVASTATIN CALCIUM 20 MG PO TABS
40.0000 mg | ORAL_TABLET | Freq: Every day | ORAL | Status: DC
  Administered 2018-01-29: 40 mg via ORAL
  Filled 2018-01-29: qty 2

## 2018-01-29 MED ORDER — NITROGLYCERIN 0.4 MG SL SUBL: 0.4000 mg | SUBLINGUAL_TABLET | SUBLINGUAL | Status: DC | PRN

## 2018-01-29 MED ORDER — DM-GUAIFENESIN ER 30-600 MG PO TB12: 1.0000 | ORAL_TABLET | Freq: Two times a day (BID) | ORAL | Status: DC | PRN

## 2018-01-29 MED ORDER — TRIAMCINOLONE ACETONIDE 0.1 % EX CREA
1.0000 "application " | TOPICAL_CREAM | Freq: Every day | CUTANEOUS | Status: DC
  Administered 2018-01-30: 1 via TOPICAL
  Filled 2018-01-29: qty 15

## 2018-01-29 MED ORDER — ACETAMINOPHEN 325 MG PO TABS: 650.0000 mg | ORAL_TABLET | Freq: Four times a day (QID) | ORAL | Status: DC | PRN

## 2018-01-29 MED ORDER — ESCITALOPRAM OXALATE 10 MG PO TABS: 10.0000 mg | ORAL_TABLET | Freq: Every evening | ORAL | Status: DC

## 2018-01-29 MED ORDER — NICOTINE 21 MG/24HR TD PT24: 21.0000 mg | MEDICATED_PATCH | Freq: Every day | TRANSDERMAL | Status: DC

## 2018-01-29 MED ORDER — INSULIN ASPART PROT & ASPART (70-30 MIX) 100 UNIT/ML ~~LOC~~ SUSP: 20.0000 [IU] | Freq: Two times a day (BID) | SUBCUTANEOUS | Status: DC

## 2018-01-29 MED ORDER — LEVALBUTEROL HCL 1.25 MG/0.5ML IN NEBU: 1.2500 mg | INHALATION_SOLUTION | Freq: Four times a day (QID) | RESPIRATORY_TRACT | Status: DC

## 2018-01-29 MED ORDER — SODIUM CHLORIDE 0.9 % IV SOLN
INTRAVENOUS | Status: DC
  Administered 2018-01-29: 20:00:00 via INTRAVENOUS

## 2018-01-29 MED ORDER — SODIUM CHLORIDE 0.9% IV SOLUTION
Freq: Once | INTRAVENOUS | Status: AC
  Administered 2018-01-29: 20:00:00 via INTRAVENOUS

## 2018-01-29 NOTE — H&P (Addendum)
History and Physical    Paula Wheeler WLN:[id number] DOB: 23-Oct-1950 DOA: 01/29/2018  Referring MD/NP/PA:   PCP: Hulan Fess, MD   Patient coming from:  The patient is coming from home.  At baseline, pt is independent for most of ADL.   Chief Complaint: Low hemoglobin and shortness of breath  HPI: Paula Wheeler is a 67 y.o. female with medical history significant of hypertension, hyperlipidemia, diabetes mellitus, GERD, depression, GI bleeding, erosive gastritis, iron deficiency anemia, PVD, atrial fibrillation on Eliquis, tobacco abuse, CKD 4, dCHF, who presents with shortness of breath and low hemoglobin.  Patient states that she has been having shortness of breath in the past 3 days.  Her PCP checked her hemoglobin, which was 6.0 yesterday, therefore sent patient to ED for further evaluation treatment.  Patient denies any chest pain, fever or chills.  Patient states that she has cough with very minimal amount of yellow-colored sputum production.  Her PCP put patient on Z-Pak and prednisone yesterday.  No nausea, vomiting, diarrhea or abdominal pain.  Denies any hematuria, hematochezia.  No dark stool.  Denies symptoms of UTI or unilateral weakness.  She states that she tool last dose of Eliquis was this morning.  EGD by Dr. Penelope Coop on 05/15/16 showed erosive gastropathy. Pt states that she had colonoscopy approximately 10 years ago, which was negative.  ED Course: pt was found to have negative FOBT, hemoglobin dropped from 10.4 on 03/31/17-->5.8 today, trop 0.06, WBC 15.6, INR 1.60, worsening renal function, temperature normal, tachycardia, tachypnea, oxygen saturation 99% on room air.  Chest x-ray negative.  Patient is placed on telemetry bed of observation.  Review of Systems:   General: no fevers, chills, no body weight gain, fatigue HEENT: no blurry vision, hearing changes or sore throat Respiratory: has dyspnea, coughing, no wheezing CV: no chest pain, no palpitations GI: no  nausea, vomiting, abdominal pain, diarrhea, constipation GU: no dysuria, burning on urination, increased urinary frequency, hematuria  Ext: has leg edema Neuro: no unilateral weakness, numbness, or tingling, no vision change or hearing loss Skin: no rash, no skin tear. MSK: No muscle spasm, no deformity, no limitation of range of movement in spin Heme: No easy bruising.  Travel history: No recent long distant travel.  Allergy:  Allergies  Allergen Reactions  . Nsaids Swelling    Hands and legs swell  . Penicillins Hives    Has patient had a PCN reaction causing immediate rash, facial/tongue/throat swelling, SOB or lightheadedness with hypotension: Yes Has patient had a PCN reaction causing severe rash involving mucus membranes or skin necrosis: No Has patient had a PCN reaction that required hospitalization: No Has patient had a PCN reaction occurring within the last 10 years: No If all of the above answers are "NO", then may proceed with Cephalosporin use.     Past Medical History:  Diagnosis Date  . Anemia   . COPD (chronic obstructive pulmonary disease) ([place])   . Dyslipidemia   . Exogenous obesity   . GERD (gastroesophageal reflux disease)   . GI bleed 05/14/2016  . History of abnormal cells from cervix   . Hyperlipidemia   . Hypertension   . Iron deficiency anemia   . Longstanding persistent atrial fibrillation   . Microalbuminuria   . Tobacco dependence   . Type II diabetes mellitus ([place])     Past Surgical History:  Procedure Laterality Date  . CARDIOVERSION N/A 08/07/2016   Procedure: CARDIOVERSION;  Surgeon: Thayer Headings, MD;  Location: [facility]  ENDOSCOPY;  Service: Cardiovascular;  Laterality: N/A;  . DIAGNOSTIC MAMMOGRAM  05/2015  . ESOPHAGOGASTRODUODENOSCOPY N/A 05/15/2016   Procedure: ESOPHAGOGASTRODUODENOSCOPY (EGD);  Surgeon: Wonda Horner, MD;  Location: [facility] ENDOSCOPY;  Service: Endoscopy;  Laterality: N/A;  . [place]   "I think they left  my appendix"  . PAP SMEAR  2012 AND 2017    Social History:  reports that she has been smoking cigarettes. She has a 20.00 pack-year smoking history. She has never used smokeless tobacco. She reports that she drinks alcohol. She reports that she does not use drugs.  Family History:  Family History  Problem Relation Age of Onset  . Cancer Mother        BREAST  . Hypertension Mother   . Macular degeneration Mother   . Heart attack Father   . Diabetes Father   . Colon polyps Brother   . Colon polyps Brother   . Colon polyps Brother   . Colon polyps Brother   . Colon polyps Brother   . Colon polyps Brother      Prior to Admission medications   Medication Sig Start Date End Date Taking? Authorizing Provider  albuterol (PROVENTIL HFA;VENTOLIN HFA) 108 (90 Base) MCG/ACT inhaler Inhale 2 puffs into the lungs every 4 (four) hours as needed for wheezing or shortness of breath.   Yes [provider]  allopurinol (ZYLOPRIM) 100 MG tablet Take 100 mg by mouth daily.   Yes [provider]  atorvastatin (LIPITOR) 80 MG tablet Take 40 mg by mouth at bedtime.  04/09/16  Yes Wellington Hampshire, MD  Cyanocobalamin (VITAMIN B-12 IJ) Inject 1 mL as directed every 30 (thirty) days.    Yes [provider]  ELIQUIS 5 MG TABS tablet TAKE 1 TABLET(5 MG) BY MOUTH TWICE DAILY Patient taking differently: Take 5 mg by mouth 2 (two) times daily.  11/21/17  Yes Nahser, Wonda Cheng, MD  escitalopram (LEXAPRO) 10 MG tablet Take 10 mg by mouth every evening.  03/11/17  Yes [provider]  HYDROcodone-acetaminophen (NORCO/VICODIN) 5-325 MG tablet Take 1 tablet by mouth every 6 (six) hours as needed for moderate pain.   Yes [provider]  insulin NPH-regular Human (NOVOLIN 70/30) (70-30) 100 UNIT/ML injection Inject 35-60 Units into the skin See admin instructions. Inject 35 units into the skin in the morning and 60 units in the evening   Yes [provider]    loratadine (CLARITIN) 10 MG tablet Take 10 mg by mouth daily.   Yes [provider]  metFORMIN (GLUCOPHAGE) 500 MG tablet Take 1,000-1,500 mg by mouth See admin instructions. Take 1,000 mg by mouth in the morning and 1,500 mg in the evening   Yes [provider]  pantoprazole (PROTONIX) 40 MG tablet Take 1 tablet (40 mg total) by mouth daily. 05/16/16  Yes Ghimire, Henreitta Leber, MD  predniSONE (DELTASONE) 20 MG tablet Take 10-60 mg by mouth See admin instructions. Take 60 mg by mouth once daily for 2 days, then 40 mg once daily for 2 days, then 20 mg once daily for 2 days, then 10 mg once daily for 2 days 01/28/18 02/04/18 Yes [provider]  SPIRIVA HANDIHALER 18 MCG inhalation capsule Place 18 mcg into inhaler and inhale daily.  03/13/17  Yes [provider]  torsemide (DEMADEX) 20 MG tablet Take 1 tablet (20 mg total) by mouth daily. 09/17/17  Yes Nahser, Wonda Cheng, MD  TRIAMCINOLONE ACETONIDE EX Apply 1 application topically See  admin instructions. Apply to both legs daily as directed   Yes [provider]  triamterene-hydrochlorothiazide (MAXZIDE) 75-50 MG tablet Take 1 tablet by mouth daily.   Yes [provider]  ZITHROMAX Z-PAK 250 MG tablet Take 250-500 mg by mouth See admin instructions. Take 500 mg by mouth on day one, then 250 mg once daily on days 2-5 01/28/18 02/01/18 Yes [provider]  Insulin Syringe-Needle U-100 (B-D INS SYR ULTRAFINE 1CC/31G) 31G X 5/16" 1 ML MISC by Does not apply route.    [provider]    Physical Exam: Vitals:   01/29/18 2030 01/29/18 2045 01/29/18 2127 01/29/18 2238  BP: (!) 150/65 135/71 (!) 170/81 (!) 149/77  Pulse: 98 (!) 115 (!) 121 91  Resp: (!) 27 (!) 26 18 18   Temp:   98 F (36.7 C) 98.4 F (36.9 C)  TempSrc:   Oral Oral  SpO2: 100% 97% 98% 97%  Weight:      Height:       General: Not in acute distress.  Pale looking. HEENT:       Eyes: PERRL, EOMI, no scleral icterus.        ENT: No discharge from the ears and nose, no pharynx injection, no tonsillar enlargement.        Neck: No JVD, no bruit, no mass felt. Heme: No neck lymph node enlargement. Cardiac: S1/S2, RRR, No murmurs, No gallops or rubs. Respiratory: has very mild wheezing bilaterally. GI: Soft, nondistended, nontender, no rebound pain, no organomegaly, BS present. GU: No hematuria Ext: 2+ pitting leg edema bilaterally. 2+DP/PT pulse bilaterally. Musculoskeletal: No joint deformities, No joint redness or warmth, no limitation of ROM in spin. Skin: No rashes.  Neuro: Alert, oriented X3, cranial nerves II-XII grossly intact, moves all extremities normally.  Psych: Patient is not psychotic, no suicidal or hemocidal ideation.  Labs on Admission: I have personally reviewed following labs and imaging studies  CBC: Recent Labs  Lab 01/29/18 1820  WBC 15.6*  HGB 5.8*  HCT 20.8*  MCV 79.1  PLT 147*   Basic Metabolic Panel: Recent Labs  Lab 01/29/18 1820  NA 135  K 5.1  CL 103  CO2 15*  GLUCOSE 251*  BUN 38*  CREATININE 2.83*  CALCIUM 9.4   GFR: Estimated Creatinine Clearance: 22.8 mL/min (A) (by C-G formula based on SCr of 2.83 mg/dL (H)). Liver Function Tests: Recent Labs  Lab 01/29/18 1820  AST 18  ALT 10  ALKPHOS 92  BILITOT 0.2*  PROT 6.2*  ALBUMIN 3.6   No results for input(s): LIPASE, AMYLASE in the last 168 hours. No results for input(s): AMMONIA in the last 168 hours. Coagulation Profile: Recent Labs  Lab 01/29/18 1832  INR 1.60   Cardiac Enzymes: Recent Labs  Lab 01/29/18 1942  TROPONINI 0.06*   BNP (last 3 results) No results for input(s): PROBNP in the last 8760 hours. HbA1C: No results for input(s): HGBA1C in the last 72 hours. CBG: Recent Labs  Lab 01/29/18 2126  GLUCAP 254*   Lipid Profile: No results for input(s): CHOL, HDL, LDLCALC, TRIG, CHOLHDL, LDLDIRECT in the last 72 hours. Thyroid Function Tests: No results for input(s): TSH, T4TOTAL,  FREET4, T3FREE, THYROIDAB in the last 72 hours. Anemia Panel: No results for input(s): VITAMINB12, FOLATE, FERRITIN, TIBC, IRON, RETICCTPCT in the last 72 hours. Urine analysis: No results found for: COLORURINE, APPEARANCEUR, LABSPEC, PHURINE, GLUCOSEU, HGBUR, BILIRUBINUR, KETONESUR, PROTEINUR, UROBILINOGEN, NITRITE, LEUKOCYTESUR Sepsis Labs: @LABRCNTIP (procalcitonin:4,lacticidven:4) )No results found for this or  any previous visit (from the past 240 hour(s)).   Radiological Exams on Admission: Dg Chest 2 View  Result Date: 01/29/2018 CLINICAL DATA:  Shortness of breath. Anemia. Smoker. EXAM: CHEST - 2 VIEW COMPARISON:  01/28/2018. FINDINGS: Stable enlarged cardiac silhouette and linear scarring in both lower lung zones. Clear lungs with normal vascularity. Mild thoracic spine degenerative changes. IMPRESSION: No acute abnormality.  Stable cardiomegaly. Electronically Signed   By: Claudie Revering M.D.   On: 01/29/2018 19:26     EKG: Independently reviewed.  Atrial fibrillation, low voltage, nonspecific T wave change.  Assessment/Plan Principal Problem:   Symptomatic anemia Active Problems:   Essential hypertension   HLD (hyperlipidemia)   Persistent atrial fibrillation   Acute renal failure superimposed on stage 4 chronic kidney disease (HCC)   Type II diabetes mellitus with renal manifestations (HCC)   GERD (gastroesophageal reflux disease)   Depression   Tobacco abuse   Elevated troponin   Leukocytosis   Chronic diastolic CHF (congestive heart failure) (HCC)  Symptomatic anemia:  Hgb dropped from 10.4-->5.8. Pt is on Eliquis, but FOBT negative. It is possible that pt may have had GIB which stopped now. She had EGD by Dr. Penelope Coop on 05/15/16,which showed erosive gastropathy. Pt states that she had colonoscopy approximately 10 years ago, which was negative.  - will place in tele bed for obs - 2 units of blood was ordered by EDP--> will order another 1 U of blood in order to keep  Hgb>8.0 since pt has positive trop - NPO - IVF: pt was started with 125 mL/hr of NS in ED, which is d/ced after blood transfusions since patient already had bilateral 2+ leg edema indicating fluid overload. - Start IV pantoprazole gtt - Zofran IV for nausea - Avoid NSAIDs and SQ heparin - Maintain IV access (2 large bore IVs if possible). - Monitor closely and follow q6h cbc, transfuse as necessary, if Hgb<8.0 - LaB: INR, PTT and type screen - check LDH and peripheral smear to r/o hemolysis - anemia panel - please call GI in AM  Essential hypertension: -hold torsemide and Maxide due to worsening renal function -IV hydralazine as needed  HLD (hyperlipidemia): -lipitor  Persistent atrial fibrillation: CHA2DS2-VASc Score is 6, needs oral anticoagulation. Patient is on Eliquis at home. Heart rate 110s-->90s. ptis not taking BB or CCB -hold Eliquis.  Acute renal failure superimposed on stage 4 chronic kidney disease ([place]): Baseline Cre is 1.6-2.0, pt's Cre is 2.83 and BUN 38 on admission. Likely due to prerenal secondary to GIB and continuation of diuretics - Follow up renal function by BMP - Hold lasix and Maxide-->will give 20 mg of IV lasix after finishing 2 U of blood transfusion  Type II diabetes mellitus with renal manifestations ([place]): Last A1c not on record.  Patient is taking 70/30 insulin and metformin at home -will decrease 70/30 insulin dose from 35-60 units to 20-40 units twice daily -SSI  GERD: -Protonix IV  Depression: -Lexapro  Tobacco abuse: -Did counseling about importance of quitting smoking -Nicotine patch  Elevated troponin: Patient does not have chest pain.  Likely due to demand ischemia. Her shortness of breath is likely due to symptomatic anemia. - prn Nitroglycerin, Morphine, lipitor  - Risk factor stratification: will check FLP and A1C  - will not give ASA due to possible GIB  Chronic dCHF: 2D echo on 09/24/2017 showed EF of 50-55%.  Patient has 2+  leg edema, but no JVD.  No pulmonary edema on chest x-ray.  Patient  has some fluid overload, does not seem to have CHF acute exacerbation.  Her shortness breath is likely due to symptomatic anemia. -Hold torsemide and Maxide due to GI bleeding -Check BNP  Leukocytosis: no fever.  Possibly due to steroid induced demargination.  -Follow-up urinalysis and blood culture  Possible antagonist COPD: Pt has mild cough and mild wheezing on auscultation, indicating possible undiagnosed COPD given long history of smoking.  Patient was started with azithromycin and steroid. -DC azithromycin steroid -Atrovent nebulizer, PRN Xopenex nebulizer -PRN Mucinex cough    DVT ppx: SCD Code Status: Full code Family Communication: Yes, patient's brother, son, daughter-in-law    at bed side  to previous home environment Consults called:  none Admission status: Obs / tele   Date of Service 01/29/2018    Ivor Costa [facility] Pager [phone]  If 7PM-7AM, please contact night-coverage [email or web address] Password [email or web address] 01/29/2018, 11:10 PM

## 2018-01-29 NOTE — ED Provider Notes (Signed)
[place] EMERGENCY DEPARTMENT Provider Note   CSN: [id number] Arrival date & time: 01/29/18  1732     History   Chief Complaint Chief Complaint  Patient presents with  . Abnormal Lab    HPI Paula Wheeler is a 67 y.o. female.  Pt presents to the ED today with low hemoglobin.  The pt has been feeling sob for the past few days.  She went to her doctor yesterday who ordered blood work.  The pt was called today to let her know that her hgb was down to 6 and to come to the hospital.  The pt denies black stool.  She is on Eliquis for a.fib.  She had a GI bleed in Jan 2018 and was scoped by Dr. Penelope Coop [facility]).  Scope showed erosive gastropathy.  Pt said she's been avoiding goody powders, etc. Since then.     Past Medical History:  Diagnosis Date  . Anemia   . COPD (chronic obstructive pulmonary disease) ([place])   . Dyslipidemia   . Exogenous obesity   . GERD (gastroesophageal reflux disease)   . GI bleed 05/14/2016  . History of abnormal cells from cervix   . Hyperlipidemia   . Hypertension   . Iron deficiency anemia   . Longstanding persistent atrial fibrillation   . Microalbuminuria   . Tobacco dependence   . Type II diabetes mellitus [facility])     Patient Active Problem List   Diagnosis Date Noted  . PAD (peripheral artery disease) ([place]) 12/23/2017  . Persistent atrial fibrillation 06/10/2016  . GI bleed 05/14/2016  . Symptomatic anemia 05/14/2016  . Diabetes mellitus with complication ([place]) 20/25/4270  . Essential hypertension 05/14/2016  . HLD (hyperlipidemia) 05/14/2016  . Anemia   . Renal insufficiency     Past Surgical History:  Procedure Laterality Date  . CARDIOVERSION N/A 08/07/2016   Procedure: CARDIOVERSION;  Surgeon: Thayer Headings, MD;  Location: [place];  Service: Cardiovascular;  Laterality: N/A;  . DIAGNOSTIC MAMMOGRAM  05/2015  . ESOPHAGOGASTRODUODENOSCOPY N/A 05/15/2016   Procedure: ESOPHAGOGASTRODUODENOSCOPY (EGD);   Surgeon: Wonda Horner, MD;  Location[facility] ENDOSCOPY;  Service: Endoscopy;  Laterality: N/A;  . [place]   "I think they left my appendix"  . PAP SMEAR  2012 AND 2017     OB History   None      Home Medications    Prior to Admission medications   Medication Sig Start Date End Date Taking? Authorizing Provider  albuterol (PROVENTIL HFA;VENTOLIN HFA) 108 (90 Base) MCG/ACT inhaler Inhale 2 puffs into the lungs every 4 (four) hours as needed for wheezing or shortness of breath.   Yes [provider]  allopurinol (ZYLOPRIM) 100 MG tablet Take 100 mg by mouth daily.   Yes [provider]  atorvastatin (LIPITOR) 80 MG tablet Take 40 mg by mouth at bedtime.  04/09/16  Yes Wellington Hampshire, MD  Cyanocobalamin (VITAMIN B-12 IJ) Inject 1 mL as directed every 30 (thirty) days.    Yes [provider]  ELIQUIS 5 MG TABS tablet TAKE 1 TABLET(5 MG) BY MOUTH TWICE DAILY Patient taking differently: Take 5 mg by mouth 2 (two) times daily.  11/21/17  Yes Nahser, Wonda Cheng, MD  escitalopram (LEXAPRO) 10 MG tablet Take 10 mg by mouth every evening.  03/11/17  Yes [provider]  HYDROcodone-acetaminophen (NORCO/VICODIN) 5-325 MG tablet Take 1 tablet by mouth every 6 (six) hours as needed for moderate pain.   Yes  [provider]  insulin NPH-regular Human (NOVOLIN 70/30) (70-30) 100 UNIT/ML injection Inject 35-60 Units into the skin See admin instructions. Inject 35 units into the skin in the morning and 60 units in the evening   Yes [provider]  loratadine (CLARITIN) 10 MG tablet Take 10 mg by mouth daily.   Yes [provider]  metFORMIN (GLUCOPHAGE) 500 MG tablet Take 1,000-1,500 mg by mouth See admin instructions. Take 1,000 mg by mouth in the morning and 1,500 mg in the evening   Yes [provider]  pantoprazole (PROTONIX) 40 MG tablet Take 1 tablet (40 mg total) by mouth daily. 05/16/16  Yes Ghimire, Henreitta Leber,  MD  predniSONE (DELTASONE) 20 MG tablet Take 10-60 mg by mouth See admin instructions. Take 60 mg by mouth once daily for 2 days, then 40 mg once daily for 2 days, then 20 mg once daily for 2 days, then 10 mg once daily for 2 days 01/28/18 02/04/18 Yes [provider]  SPIRIVA HANDIHALER 18 MCG inhalation capsule Place 18 mcg into inhaler and inhale daily.  03/13/17  Yes [provider]  torsemide (DEMADEX) 20 MG tablet Take 1 tablet (20 mg total) by mouth daily. 09/17/17  Yes Nahser, Wonda Cheng, MD  TRIAMCINOLONE ACETONIDE EX Apply 1 application topically See admin instructions. Apply to both legs daily as directed   Yes [provider]  triamterene-hydrochlorothiazide (MAXZIDE) 75-50 MG tablet Take 1 tablet by mouth daily.   Yes [provider]  ZITHROMAX Z-PAK 250 MG tablet Take 250-500 mg by mouth See admin instructions. Take 500 mg by mouth on day one, then 250 mg once daily on days 2-5 01/28/18 02/01/18 Yes [provider]  Insulin Syringe-Needle U-100 (B-D INS SYR ULTRAFINE 1CC/31G) 31G X 5/16" 1 ML MISC by Does not apply route.    [provider]    Family History Family History  Problem Relation Age of Onset  . Cancer Mother        BREAST  . Hypertension Mother   . Macular degeneration Mother   . Heart attack Father   . Diabetes Father   . Colon polyps Brother   . Colon polyps Brother   . Colon polyps Brother   . Colon polyps Brother   . Colon polyps Brother   . Colon polyps Brother     Social History Social History   Tobacco Use  . Smoking status: Current Every Day Smoker    Packs/day: 0.50    Years: 40.00    Pack years: 20.00    Types: Cigarettes  . Smokeless tobacco: Never Used  Substance Use Topics  . Alcohol use: Yes    Comment: 05/14/2016 "I'll have a drink once/year; New Year's"  . Drug use: No     Allergies   Nsaids and Penicillins   Review of Systems Review of Systems  Respiratory: Positive for shortness  of breath.   All other systems reviewed and are negative.    Physical Exam Updated Vital Signs BP (!) 149/74   Pulse (!) 104   Temp 98.4 F (36.9 C) (Oral)   Resp 18   Ht 5' 4.5" (1.638 m)   Wt 103.4 kg   SpO2 99%   BMI 38.53 kg/m   Physical Exam  Constitutional: She is oriented to person, place, and time. She appears well-developed and well-nourished.  HENT:  Head: Normocephalic and atraumatic.  Right Ear: External ear normal.  Left Ear: External ear normal.  Nose: Nose  normal.  Mouth/Throat: Oropharynx is clear and moist.  Eyes: Pupils are equal, round, and reactive to light. Conjunctivae and EOM are normal.  Neck: Normal range of motion. Neck supple.  Cardiovascular: Normal rate, regular rhythm, normal heart sounds and intact distal pulses.  Pulmonary/Chest: Effort normal and breath sounds normal.  Abdominal: Soft. Bowel sounds are normal.  Genitourinary: Rectal exam shows guaiac negative stool.  Musculoskeletal: Normal range of motion.  Neurological: She is alert and oriented to person, place, and time.  Skin: Skin is warm. Capillary refill takes less than 2 seconds.  Psychiatric: She has a normal mood and affect. Her behavior is normal. Judgment and thought content normal.  Nursing note and vitals reviewed.    ED Treatments / Results  Labs (all labs ordered are listed, but only abnormal results are displayed) Labs Reviewed  CBC - Abnormal; Notable for the following components:      Result Value   WBC 15.6 (*)    RBC 2.63 (*)    Hemoglobin 5.8 (*)    HCT 20.8 (*)    MCH 22.1 (*)    MCHC 27.9 (*)    RDW 17.6 (*)    Platelets 453 (*)    All other components within normal limits  COMPREHENSIVE METABOLIC PANEL - Abnormal; Notable for the following components:   CO2 15 (*)    Glucose, Bld 251 (*)    BUN 38 (*)    Creatinine, Ser 2.83 (*)    Total Protein 6.2 (*)    Total Bilirubin 0.2 (*)    GFR calc non Af Amer 16 (*)    GFR calc Af Amer 19 (*)    Anion  gap 17 (*)    All other components within normal limits  PROTIME-INR  TROPONIN I  POC OCCULT BLOOD, ED  TYPE AND SCREEN  PREPARE RBC (CROSSMATCH)    EKG None  Radiology Dg Chest 2 View  Result Date: 01/29/2018 CLINICAL DATA:  Shortness of breath. Anemia. Smoker. EXAM: CHEST - 2 VIEW COMPARISON:  01/28/2018. FINDINGS: Stable enlarged cardiac silhouette and linear scarring in both lower lung zones. Clear lungs with normal vascularity. Mild thoracic spine degenerative changes. IMPRESSION: No acute abnormality.  Stable cardiomegaly. Electronically Signed   By: Claudie Revering M.D.   On: 01/29/2018 19:26    Procedures Procedures (including critical care time)  Medications Ordered in ED Medications  0.9 %  sodium chloride infusion ( Intravenous New Bag/Given 01/29/18 1941)  pantoprazole (PROTONIX) 80 mg in sodium chloride 0.9 % 100 mL IVPB (80 mg Intravenous New Bag/Given 01/29/18 1939)  pantoprazole (PROTONIX) 80 mg in sodium chloride 0.9 % 250 mL (0.32 mg/mL) infusion (8 mg/hr Intravenous New Bag/Given 01/29/18 1940)  0.9 %  sodium chloride infusion (Manually program via Guardrails IV Fluids) ( Intravenous New Bag/Given 01/29/18 1941)     Initial Impression / Assessment and Plan / ED Course  I have reviewed the triage vital signs and the nursing notes.  Pertinent labs & imaging results that were available during my care of the patient were reviewed by me and considered in my medical decision making (see chart for details).  CRITICAL CARE Performed by: Isla Pence   Total critical care time: 30 minutes  Critical care time was exclusive of separately billable procedures and treating other patients.  Critical care was necessary to treat or prevent imminent or life-threatening deterioration.  Critical care was time spent personally by me on the following activities: development of treatment plan with patient and/or  surrogate as well as nursing, discussions with consultants,  evaluation of patient's response to treatment, examination of patient, obtaining history from patient or surrogate, ordering and performing treatments and interventions, ordering and review of laboratory studies, ordering and review of radiographic studies, pulse oximetry and re-evaluation of patient's condition.   No obvious source for anemia.  Pt is guaiac negative and has not had black stools, although with hx of GIB.  She is symptomatic from her anemia, so 2 units were ordered to transfuse.    Pt d/w Dr. Blaine Hamper ([facility]) for admission.  Final Clinical Impressions(s) / ED Diagnoses   Final diagnoses:  SOB (shortness of breath)  Symptomatic anemia  CKD (chronic kidney disease) stage 4, GFR 15-29 ml/min [facility])    ED Discharge Orders    None       Isla Pence, MD 01/29/18 2011

## 2018-01-29 NOTE — ED Notes (Signed)
Dr. Gilford Raid notified that hgb resulted at 5.8

## 2018-01-29 NOTE — Progress Notes (Signed)
Patient arrived to unit with 1 unit of PRBC infusing through her left forearm  and protonix drip infusing through her right forearm. Patient alert and oriented x 4. MD plan of care reviewed with patient. Patient verbalizes understanding. Patient informed to call for help before getting out of bed.

## 2018-01-29 NOTE — ED Notes (Signed)
Delay in lab draw, pt being transfused.

## 2018-01-29 NOTE — ED Notes (Signed)
Patient transported to X-ray 

## 2018-01-29 NOTE — ED Triage Notes (Signed)
Patient sent to ED by PCP for Hgb 6.0 - patient on Eliquis, denies bloody or dark stools. Reports she started feeling short of breath Sunday and had blood work done yesterday. Denies dizziness/lightheadedness.

## 2018-01-30 ENCOUNTER — Other Ambulatory Visit: Payer: Self-pay

## 2018-01-30 DIAGNOSIS — N184 Chronic kidney disease, stage 4 (severe): Secondary | ICD-10-CM | POA: Diagnosis not present

## 2018-01-30 DIAGNOSIS — D649 Anemia, unspecified: Secondary | ICD-10-CM | POA: Diagnosis not present

## 2018-01-30 LAB — URINALYSIS, ROUTINE W REFLEX MICROSCOPIC
Bilirubin Urine: NEGATIVE
GLUCOSE, UA: NEGATIVE mg/dL
HGB URINE DIPSTICK: NEGATIVE
KETONES UR: NEGATIVE mg/dL
LEUKOCYTES UA: NEGATIVE
Nitrite: NEGATIVE
PH: 5 (ref 5.0–8.0)
Protein, ur: NEGATIVE mg/dL
Specific Gravity, Urine: 1.009 (ref 1.005–1.030)

## 2018-01-30 LAB — FERRITIN: Ferritin: 6 ng/mL — ABNORMAL LOW (ref 11–307)

## 2018-01-30 LAB — BASIC METABOLIC PANEL
ANION GAP: 13 (ref 5–15)
BUN: 38 mg/dL — ABNORMAL HIGH (ref 8–23)
CALCIUM: 9.3 mg/dL (ref 8.9–10.3)
CO2: 20 mmol/L — ABNORMAL LOW (ref 22–32)
CREATININE: 2.62 mg/dL — AB (ref 0.44–1.00)
Chloride: 104 mmol/L (ref 98–111)
GFR calc non Af Amer: 18 mL/min — ABNORMAL LOW (ref >60)
GFR, EST AFRICAN AMERICAN: 21 mL/min — AB (ref >60)
Glucose, Bld: 190 mg/dL — ABNORMAL HIGH (ref 70–99)
Potassium: 4.1 mmol/L (ref 3.5–5.1)
Sodium: 137 mmol/L (ref 135–145)

## 2018-01-30 LAB — LIPID PANEL
CHOLESTEROL: 111 mg/dL (ref 0–200)
HDL: 27 mg/dL — ABNORMAL LOW (ref >40)
LDL Cholesterol: 50 mg/dL (ref 0–99)
TRIGLYCERIDES: 169 mg/dL — AB (ref <150)
Total CHOL/HDL Ratio: 4.1 RATIO
VLDL: 34 mg/dL (ref 0–40)

## 2018-01-30 LAB — SAVE SMEAR

## 2018-01-30 LAB — VITAMIN B12: VITAMIN B 12: 759 pg/mL (ref 180–914)

## 2018-01-30 LAB — RETICULOCYTES
RBC.: 3.5 MIL/uL — AB (ref 3.87–5.11)
RETIC COUNT ABSOLUTE: 52.5 10*3/uL (ref 19.0–186.0)
Retic Ct Pct: 1.5 % (ref 0.4–3.1)

## 2018-01-30 LAB — LACTATE DEHYDROGENASE: LDH: 139 U/L (ref 98–192)

## 2018-01-30 LAB — IRON AND TIBC
Iron: 19 ug/dL — ABNORMAL LOW (ref 28–170)
Saturation Ratios: 4 % — ABNORMAL LOW (ref 10.4–31.8)
TIBC: 497 ug/dL — ABNORMAL HIGH (ref 250–450)
UIBC: 478 ug/dL

## 2018-01-30 LAB — CBC
HEMATOCRIT: 27.4 % — AB (ref 36.0–46.0)
HEMOGLOBIN: 8.4 g/dL — AB (ref 12.0–15.0)
MCH: 24 pg — AB (ref 26.0–34.0)
MCHC: 30.7 g/dL (ref 30.0–36.0)
MCV: 78.3 fL (ref 78.0–100.0)
Platelets: 401 10*3/uL — ABNORMAL HIGH (ref 150–400)
RBC: 3.5 MIL/uL — AB (ref 3.87–5.11)
RDW: 17.2 % — ABNORMAL HIGH (ref 11.5–15.5)
WBC: 12.9 10*3/uL — ABNORMAL HIGH (ref 4.0–10.5)

## 2018-01-30 LAB — FOLATE: FOLATE: 7 ng/mL (ref >5.9)

## 2018-01-30 LAB — HIV ANTIBODY (ROUTINE TESTING W REFLEX): HIV Screen 4th Generation wRfx: NONREACTIVE

## 2018-01-30 LAB — GLUCOSE, CAPILLARY
GLUCOSE-CAPILLARY: 204 mg/dL — AB (ref 70–99)
Glucose-Capillary: 204 mg/dL — ABNORMAL HIGH (ref 70–99)

## 2018-01-30 LAB — APTT: aPTT: 29 seconds (ref 24–36)

## 2018-01-30 LAB — TROPONIN I: TROPONIN I: 0.09 ng/mL — AB (ref <0.03)

## 2018-01-30 MED ORDER — TORSEMIDE 20 MG PO TABS
20.0000 mg | ORAL_TABLET | Freq: Every day | ORAL | Status: DC
  Administered 2018-01-30: 20 mg via ORAL
  Filled 2018-01-30: qty 1

## 2018-01-30 MED ORDER — TRIAMTERENE-HCTZ 75-50 MG PO TABS
1.0000 | ORAL_TABLET | Freq: Every day | ORAL | Status: DC
  Administered 2018-01-30: 1 via ORAL
  Filled 2018-01-30: qty 1

## 2018-01-30 MED ORDER — LEVALBUTEROL HCL 1.25 MG/0.5ML IN NEBU: 1.2500 mg | INHALATION_SOLUTION | Freq: Two times a day (BID) | RESPIRATORY_TRACT | Status: DC

## 2018-01-30 MED ORDER — SODIUM CHLORIDE 0.9 % IV SOLN
510.0000 mg | Freq: Once | INTRAVENOUS | Status: AC
  Administered 2018-01-30: 510 mg via INTRAVENOUS
  Filled 2018-01-30: qty 17

## 2018-01-30 MED ORDER — IPRATROPIUM BROMIDE 0.02 % IN SOLN
0.5000 mg | Freq: Three times a day (TID) | RESPIRATORY_TRACT | Status: DC
  Administered 2018-01-30 (×2): 0.5 mg via RESPIRATORY_TRACT
  Filled 2018-01-30 (×2): qty 2.5

## 2018-01-30 MED ORDER — APIXABAN 5 MG PO TABS
5.0000 mg | ORAL_TABLET | Freq: Two times a day (BID) | ORAL | Status: DC
  Administered 2018-01-30: 5 mg via ORAL
  Filled 2018-01-30: qty 1

## 2018-01-30 MED ORDER — LEVALBUTEROL HCL 1.25 MG/0.5ML IN NEBU
1.2500 mg | INHALATION_SOLUTION | Freq: Three times a day (TID) | RESPIRATORY_TRACT | Status: DC
  Administered 2018-01-30 (×2): 1.25 mg via RESPIRATORY_TRACT
  Filled 2018-01-30 (×2): qty 0.5

## 2018-01-30 MED ORDER — PANTOPRAZOLE SODIUM 40 MG PO TBEC
40.0000 mg | DELAYED_RELEASE_TABLET | Freq: Every day | ORAL | Status: DC
  Administered 2018-01-30: 40 mg via ORAL
  Filled 2018-01-30: qty 1

## 2018-01-30 MED ORDER — IPRATROPIUM BROMIDE 0.02 % IN SOLN: 0.5000 mg | Freq: Two times a day (BID) | RESPIRATORY_TRACT | Status: DC

## 2018-01-30 MED ORDER — FUROSEMIDE 10 MG/ML IJ SOLN
20.0000 mg | Freq: Once | INTRAMUSCULAR | Status: AC
  Administered 2018-01-30: 20 mg via INTRAVENOUS
  Filled 2018-01-30: qty 2

## 2018-01-30 NOTE — Care Management Note (Signed)
Case Management Note  Patient Details  Name: Paula Wheeler MRN: [id number] Date of Birth: 08/19/1950  Subjective/Objective:      Pt from home alone for progressive weakness found to be caused by anemia.  Pt states had not had black/bloody stools and had similar episode in January of last year.  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  Pt drives and has PCP.  Pt states prescriptions are affordable and no barriers to health care.    Pt's brother will pick her up at dc and she will be able to drive to appointments as needed after d/c.        Action/Plan: No CM needs at this time.  Expected Discharge Date:      01/30/18            Expected Discharge Plan:  Home/Self Care  In-House Referral:  NA  Discharge planning Services  CM Consult  Post Acute Care Choice:  NA Choice offered to:  NA  DME Arranged:    DME Agency:     HH Arranged:    HH Agency:     Status of Service:  Completed, signed off  If discussed at [employer] of Stay Meetings, dates discussed:    Additional Comments:  Claudie Leach, RN 01/30/2018, 10:48 AM

## 2018-01-30 NOTE — Progress Notes (Signed)
Page sent to Dr Eliseo Squires in regards to hold insulin or give sliding scale of 4units only.  cbg is 204 and pt is NPO x meds.  Waiting on response

## 2018-01-30 NOTE — Progress Notes (Signed)
Went over discharge instructions with the pt, no compliaits of pain or discomfort. Tele d/ced

## 2018-01-30 NOTE — Progress Notes (Signed)
Dr Eliseo Squires changed the pt diet and the insulin was given.

## 2018-01-30 NOTE — Progress Notes (Signed)
Discharge: Pt d/c from room via wheelchair, Family member with the pt. Discharge instructions given to the patient and family members.  No questions from pt, reintegrated to the pt to call or go to the ED for chest discomfort. Pt dressed in street clothes and left with discharge papers and prescriptions in hand. IV d/ced, tele removed and no complaints of pain or discomfort. 

## 2018-01-30 NOTE — Progress Notes (Signed)
Inpatient Diabetes Program Recommendations  AACE/ADA: New Consensus Statement on Inpatient Glycemic Control (2015)  Target Ranges:  Prepandial:   less than 140 mg/dL      Peak postprandial:   less than 180 mg/dL (1-2 hours)      Critically ill patients:  140 - 180 mg/dL   Results for Paula Wheeler, Paula Wheeler (MRN [id number]) as of 01/30/2018 09:46  Ref. Range 01/29/2018 21:26 01/30/2018 07:44  Glucose-Capillary Latest Ref Range: 70 - 99 mg/dL 254 (H) 204 (H)   Review of Glycemic Control  Diabetes history: DM 2 Outpatient Diabetes medications: 70/30 35 units qam, 60 units qpm, Metformin 1000 mg qam, 1500 mg qpm Current orders for Inpatient glycemic control: 70/30 20 units qam, 40 units qpm, Novolog 0-9 units tid  Inpatient Diabetes Program Recommendations:    Patient NPO this am but ordered 70/30 this am. Consider holding 70/30 dose this am give one time dose of NPH (long acting portion of 70/30) 22 units this am and Novolog Correction scale.  Resume 70/30 insulin tonight if not NPO.  Thanks,  Tama Headings RN, MSN, BC-ADM Inpatient Diabetes Coordinator Team Pager [phone] (8a-5p)

## 2018-01-30 NOTE — Discharge Summary (Addendum)
Physician Discharge Summary  Paula Wheeler ZDG:[id number] DOB: [birth year] DOA: 01/29/2018  PCP: Paula Wheeler  Admit date: 01/29/2018 Discharge date: 01/30/2018  Admitted From: home Discharge disposition: home   Recommendations for Outpatient Follow-Up:   Given IV iron  Patient to follow up with GI Patient to follow up with Nephrology ([place]) CBC 1 week re Hgb  Discharge Diagnosis:   Principal Problem:   Symptomatic anemia Active Problems:   Essential hypertension   HLD (hyperlipidemia)   Persistent atrial fibrillation   Acute renal failure superimposed on stage 4 chronic kidney disease (HCC)   Type II diabetes mellitus with renal manifestations (HCC)   GERD (gastroesophageal reflux disease)   Depression   Tobacco abuse   Elevated troponin   Leukocytosis   Chronic diastolic CHF (congestive heart failure) ([place])    Discharge Condition: Improved.  Diet recommendation: Low sodium, heart healthy.  Carbohydrate-modified  Wound care: None.  Code status: Full.   History of Present Illness:   Paula Wheeler is a 67 y.o. female with medical history significant of hypertension, hyperlipidemia, diabetes mellitus, GERD, depression, GI bleeding, erosive gastritis, iron deficiency anemia, PVD, atrial fibrillation on Paula Wheeler, tobacco abuse, CKD 4, dCHF, who presents with shortness of breath and low hemoglobin.  Patient states that she has been having shortness of breath in the past 3 days.  Her PCP checked her hemoglobin, which was 6.0 yesterday, therefore sent patient to ED for further evaluation treatment.  Patient denies any chest pain, fever or chills.  Patient states that she has cough with very minimal amount of yellow-colored sputum production.  Her PCP put patient on Z-Pak and prednisone yesterday.  No nausea, vomiting, diarrhea or abdominal pain.  Denies any hematuria, hematochezia.  No dark stool.  Denies symptoms of UTI or unilateral weakness.  She  states that she tool last dose of Paula Wheeler was this morning.   Hospital Course by Problem:   Symptomatic anemia -s/p 3 units PRBC -heme negative -FE panel after transfusion- still low-- replace IV -needs close outpatient follow up with GI, PCP and renal physician   CKD stage IV -follows with Paula Wheeler  COPD -continue home meds  A fib -rate controlled -continue Paula Wheeler as no sign of acute GI bleeding  Elevated troponin -suspect demand ischemia plus CKD - no chest pain   Medical Consultants:      Discharge Exam:   Vitals:   01/30/18 0612 01/30/18 0858  BP: 140/85   Pulse: 89   Resp: 18   Temp: 98.2 F (36.8 C)   SpO2: 97% 96%   Vitals:   01/30/18 0339 01/30/18 0518 01/30/18 0612 01/30/18 0858  BP: (!) 151/75  140/85   Pulse: 95  89   Resp: 18  18   Temp: 98.2 F (36.8 C)  98.2 F (36.8 C)   TempSrc: Oral  Oral   SpO2: 100% 99% 97% 96%  Weight:      Height:        General exam: Appears calm and comfortable. Has had normal BMs x 2 since hospitalization  The results of significant diagnostics from this hospitalization (including imaging, microbiology, ancillary and laboratory) are listed below for reference.     Procedures and Diagnostic Studies:   Dg Chest 2 View  Result Date: 01/29/2018 CLINICAL DATA:  Shortness of breath. Anemia. Smoker. EXAM: CHEST - 2 VIEW COMPARISON:  01/28/2018. FINDINGS: Stable enlarged cardiac silhouette and linear scarring in both lower lung zones. Clear lungs with  normal vascularity. Mild thoracic spine degenerative changes. IMPRESSION: No acute abnormality.  Stable cardiomegaly. Electronically Signed   By: Claudie Revering M.D.   On: 01/29/2018 19:26     Labs:   Basic Metabolic Panel: Recent Labs  Lab 01/29/18 1820 01/30/18 0843  NA 135 137  K 5.1 4.1  CL 103 104  CO2 15* 20*  GLUCOSE 251* 190*  BUN 38* 38*  CREATININE 2.83* 2.62*  CALCIUM 9.4 9.3   GFR Estimated Creatinine Clearance: 24.6 mL/min (A) (by C-G  formula based on SCr of 2.62 mg/dL (H)). Liver Function Tests: Recent Labs  Lab 01/29/18 1820  AST 18  ALT 10  ALKPHOS 92  BILITOT 0.2*  PROT 6.2*  ALBUMIN 3.6   No results for input(s): LIPASE, AMYLASE in the last 168 hours. No results for input(s): AMMONIA in the last 168 hours. Coagulation profile Recent Labs  Lab 01/29/18 1832  INR 1.60    CBC: Recent Labs  Lab 01/29/18 1820 01/30/18 0843  WBC 15.6* 12.9*  HGB 5.8* 8.4*  HCT 20.8* 27.4*  MCV 79.1 78.3  PLT 453* 401*   Cardiac Enzymes: Recent Labs  Lab 01/29/18 1942 01/30/18 0843  TROPONINI 0.06* 0.09*   BNP: Invalid input(s): POCBNP CBG: Recent Labs  Lab 01/29/18 2126 01/30/18 0744 01/30/18 1139  GLUCAP 254* 204* 204*   D-Dimer No results for input(s): DDIMER in the last 72 hours. Hgb A1c No results for input(s): HGBA1C in the last 72 hours. Lipid Profile Recent Labs    01/30/18 0843  CHOL 111  HDL 27*  LDLCALC 50  TRIG 169*  CHOLHDL 4.1   Thyroid function studies No results for input(s): TSH, T4TOTAL, T3FREE, THYROIDAB in the last 72 hours.  Invalid input(s): FREET3 Anemia work up Recent Labs    01/30/18 0843  VITAMINB12 759  FOLATE 7.0  FERRITIN 6*  TIBC 497*  IRON 19*  RETICCTPCT 1.5   Microbiology No results found for this or any previous visit (from the past 240 hour(s)).   Discharge Instructions:   Discharge Instructions    Diet - low sodium heart healthy   Complete by:  As directed    Diet Carb Modified   Complete by:  As directed    Discharge instructions   Complete by:  As directed    CBC 1 week Close follow up with Paula Wheeler as well as Paula Wheeler If you have dark stools/tarry appearing stools, return to the ER   Increase activity slowly   Complete by:  As directed      Allergies as of 01/30/2018      Reactions   Nsaids Swelling   Hands and legs swell   Penicillins Hives   Has patient had a PCN reaction causing immediate rash, facial/tongue/throat  swelling, SOB or lightheadedness with hypotension: Yes Has patient had a PCN reaction causing severe rash involving mucus membranes or skin necrosis: No Has patient had a PCN reaction that required hospitalization: No Has patient had a PCN reaction occurring within the last 10 years: No If all of the above answers are "NO", then may proceed with Cephalosporin use.      Medication List    STOP taking these medications   predniSONE 20 MG tablet Commonly known as:  DELTASONE   ZITHROMAX Z-PAK 250 MG tablet Generic drug:  azithromycin     TAKE these medications   albuterol 108 (90 Base) MCG/ACT inhaler Commonly known as:  PROVENTIL HFA;VENTOLIN HFA Inhale 2 puffs into the lungs  every 4 (four) hours as needed for wheezing or shortness of breath.   allopurinol 100 MG tablet Commonly known as:  ZYLOPRIM Take 100 mg by mouth daily.   atorvastatin 80 MG tablet Commonly known as:  LIPITOR Take 40 mg by mouth at bedtime.   B-D INS SYR ULTRAFINE 1CC/31G 31G X 5/16" 1 ML Misc Generic drug:  Insulin Syringe-Needle U-100 by Does not apply route.   Paula Wheeler 5 MG Tabs tablet Generic drug:  apixaban TAKE 1 TABLET(5 MG) BY MOUTH TWICE DAILY What changed:  See the new instructions.   escitalopram 10 MG tablet Commonly known as:  LEXAPRO Take 10 mg by mouth every evening.   HYDROcodone-acetaminophen 5-325 MG tablet Commonly known as:  NORCO/VICODIN Take 1 tablet by mouth every 6 (six) hours as needed for moderate pain.   insulin NPH-regular Human (70-30) 100 UNIT/ML injection Commonly known as:  NOVOLIN 70/30 Inject 35-60 Units into the skin See admin instructions. Inject 35 units into the skin in the morning and 60 units in the evening   loratadine 10 MG tablet Commonly known as:  CLARITIN Take 10 mg by mouth daily.   metFORMIN 500 MG tablet Commonly known as:  GLUCOPHAGE Take 1,000-1,500 mg by mouth See admin instructions. Take 1,000 mg by mouth in the morning and 1,500 mg in the  evening   pantoprazole 40 MG tablet Commonly known as:  PROTONIX Take 1 tablet (40 mg total) by mouth daily.   SPIRIVA HANDIHALER 18 MCG inhalation capsule Generic drug:  tiotropium Place 18 mcg into inhaler and inhale daily.   torsemide 20 MG tablet Commonly known as:  DEMADEX Take 1 tablet (20 mg total) by mouth daily.   TRIAMCINOLONE ACETONIDE EX Apply 1 application topically See admin instructions. Apply to both legs daily as directed   triamterene-hydrochlorothiazide 75-50 MG tablet Commonly known as:  MAXZIDE Take 1 tablet by mouth daily.   VITAMIN B-12 IJ Inject 1 mL as directed every 30 (thirty) days.      Follow-up Information    Little, Lennette Bihari, Wheeler Follow up in 1 week(s).   Specialty:  Family Medicine Contact information: [place] [address] [phone]        Nahser, Wonda Cheng, Wheeler .   Specialty:  Cardiology Contact information: [place] [address] [phone]        Madelon Lips, Wheeler. Schedule an appointment as soon as possible for a visit.   Specialty:  Nephrology Contact information: [place] [address] [phone]        Laurence Spates, Wheeler. Schedule an appointment as soon as possible for a visit.   Specialty:  Gastroenterology Contact information: [address] [place] [address] [phone]            Time coordinating discharge: 25 min  Signed:  Geradine Girt  [facility] 01/30/2018, 3:05 PM

## 2018-01-31 LAB — TYPE AND SCREEN
ABO/RH(D): O POS
ANTIBODY SCREEN: NEGATIVE
UNIT DIVISION: 0
Unit division: 0
Unit division: 0

## 2018-01-31 LAB — BPAM RBC
BLOOD PRODUCT EXPIRATION DATE: 201911032359
Blood Product Expiration Date: 201910302359
Blood Product Expiration Date: 201910302359
ISSUE DATE / TIME: 201910031956
ISSUE DATE / TIME: 201910032331
ISSUE DATE / TIME: 201910040311
Unit Type and Rh: 5100
Unit Type and Rh: 5100
Unit Type and Rh: 5100

## 2018-01-31 LAB — HEMOGLOBIN A1C
HEMOGLOBIN A1C: 6.7 % — AB (ref 4.8–5.6)
Mean Plasma Glucose: 146 mg/dL

## 2018-02-02 LAB — PATHOLOGIST SMEAR REVIEW

## 2018-02-04 LAB — CULTURE, BLOOD (ROUTINE X 2)
CULTURE: NO GROWTH
CULTURE: NO GROWTH

## 2018-02-05 ENCOUNTER — Telehealth: Payer: Self-pay

## 2018-02-05 NOTE — Telephone Encounter (Signed)
   [facility] Pre-operative Risk Assessment    Request for surgical clearance:  1. What type of surgery is being performed?  Colonoscopy/Endoscopy for Iron Def. Anemia   2. When is this surgery scheduled?  03/12/18   3. What type of clearance is required (medical clearance vs. Pharmacy clearance to hold med vs. Both)? Pharmacy  4. Are there any medications that need to be held prior to surgery and how long? Eliquis- want to hold 2 days prior to procedure. Can this patient temporarily stop Eliquis before her procedure or after the procedure if polyps are removed?  5. Practice name and name of physician performing surgery? [facility], Dr. Oletta Lamas   6. What is your office phone number  [phone]       7.   What is your office fax number  [phone]  8.   Anesthesia type (None, local, MAC, general) ? unknown   Paula Wheeler 02/05/2018, 10:47 AM  _________________________________________________________________

## 2018-02-06 NOTE — Telephone Encounter (Signed)
Please address eliquis

## 2018-02-06 NOTE — Telephone Encounter (Signed)
Patient with diagnosis of Afib on Eliquis for anticoagulation.    Procedure: colonscopy/endoscopy Date of procedure: 03/12/18  CHADS2-VASc score of  6 (CHF, HTN, AGE, DM2, stroke/tia x 2, PVD, AGE, female)  CrCl 54ml/min  Per office protocol, patient can hold Eliquis for 2 days prior to procedure.

## 2018-03-04 ENCOUNTER — Encounter: Payer: Self-pay | Admitting: Cardiovascular Disease

## 2018-03-04 ENCOUNTER — Ambulatory Visit: Payer: 59 | Admitting: Cardiovascular Disease

## 2018-03-04 VITALS — BP 136/58 | HR 100 | Ht 64.5 in | Wt 213.0 lb

## 2018-03-04 DIAGNOSIS — I4819 Other persistent atrial fibrillation: Secondary | ICD-10-CM

## 2018-03-04 DIAGNOSIS — I5032 Chronic diastolic (congestive) heart failure: Secondary | ICD-10-CM

## 2018-03-04 NOTE — Patient Instructions (Signed)

## 2018-03-04 NOTE — Progress Notes (Signed)
Cardiology Office Note   Date:  03/04/2018   ID:  Paula Wheeler, DOB 1951-01-15, MRN [id number]  PCP:  Hulan Fess, MD  Cardiologist:   Mertie Moores, MD   Chief Complaint  Patient presents with  . Hypertension  . Atrial Fibrillation   Problem list 1. Essential HTN  2. Hyperlipidemia 3. Cardiomegaly on x-ray 4. Diabetes mellitus 5. Obesity  6. Atrial fib -CHADS2VASC score is 56 ( female, age 67, HTN, PVD)     Nov. 13, 2017: Paula Wheeler is a 67 y.o. female who presents for evaluation of cardiomegaly She requested a CXR to look for lung issues ( is a current smoker) was found have cardiac megaly.  Her main complaint is that her legs give out when she is walking. She does not get any regular exercise.  No CP.   Some shortness of breath with exertion  Has DM,   She is getting better control of her glucose levels recently .    She admits that she eats more salt that she probably should. She's canned soup was on occasions. She put salt on her apples.  Feb. 12, 2018:  Pt was seen by Dr. Fletcher Anon for PVD. Was found to have atrial fib.   CHADS2VASC score is 41 ( female, age 30, HTN, PVD)  Started on Eliquis. Developed a GI bleed.  EGD shows erosive gastropathy.   She was told to stop her Goodies powders and Indomethicin ( which she was taking for gout )  She has seen Dr. Oletta Lamas -  She decided to reduce her Eliquis to 5 mg a day .    July 25, 2016:  Pt is seen back for follow Up of her atrial fibrillation. She was started on L course but she developed a GI bleed. She had been taking lots of goodies powders and indomethacin for gout.  July 25, 2016:  No symptoms related to atrial fib. Still smoking.  Her hands are cold at night  BP is up today .  Her Quinipril was reduced to once a day when she was in the hospital in jan. 2018.   October 28, 2016:  Was cardioverted on April 11 Unfortunately, is back in atrial fib She cannot tell that her HR is irreg.    Remains on Eliquis   Echo from 12. /17 showed normal Lv systolic function   Still smoking   04-02-17:  Mother died recently . No CP or dyspnea  Hx of atria fib   Sep 17, 2017:  Paula Wheeler is seen for follow up of atrial fib  Is having difficulty breathing and her legs are swollen  Dyspnea for the past 3 weeks.  Not associated with any CP Has cut back on her smoking  Wt today is 226.  ( up 15 lbs since Dec. 2018)  Has lack of energy . Seems to feel better when she is on prednisone   She is in persistent atrial fibrillation.  We cardioverted her about a year ago but it only lasted for several weeks.  We discussed going to the A. fib clinic for consideration of Tikosyn therapy.  January 05, 2018: Seen today for follow-up of her atrial fibrillation, hypertension hyperlipidemia. Was started on Pletal - has not started it until she cleared it with me  Still smoking  Previously, we had her on losartan but it affected her renal function.  We also had her on some amlodipine but this caused significant leg edema.  She  has stopped both of these medications and her blood pressure remains well controlled.  March 04, 2018: Paula Wheeler is seen today for follow-up of her atrial fibrillation.  She also has hypertension and hyperlipidemia.  She has been anemic. Hemoglobin was 5.8.  She received 3 units packed red blood cells.  She has chronic kidney disease stage IV. She had not noticed any blood in her stool or black tarry stool   Is scheduled for EGD and colonoscopy Nov. 14,  Our   Past Medical History:  Diagnosis Date  . Anemia   . COPD (chronic obstructive pulmonary disease) ([place])   . Dyslipidemia   . Exogenous obesity   . GERD (gastroesophageal reflux disease)   . GI bleed 05/14/2016  . History of abnormal cells from cervix   . Hyperlipidemia   . Hypertension   . Iron deficiency anemia   . Longstanding persistent atrial fibrillation   . Microalbuminuria   . Tobacco  dependence   . Type II diabetes mellitus ([place])     Past Surgical History:  Procedure Laterality Date  . CARDIOVERSION N/A 08/07/2016   Procedure: CARDIOVERSION;  Surgeon: Thayer Headings, MD;  Location: [place];  Service: Cardiovascular;  Laterality: N/A;  . DIAGNOSTIC MAMMOGRAM  05/2015  . ESOPHAGOGASTRODUODENOSCOPY N/A 05/15/2016   Procedure: ESOPHAGOGASTRODUODENOSCOPY (EGD);  Surgeon: Wonda Horner, MD;  Location: [facility] ENDOSCOPY;  Service: Endoscopy;  Laterality: N/A;  . [place]   "I think they left my appendix"  . PAP SMEAR  2012 AND 2017     Current Outpatient Medications  Medication Sig Dispense Refill  . albuterol (PROVENTIL HFA;VENTOLIN HFA) 108 (90 Base) MCG/ACT inhaler Inhale 2 puffs into the lungs every 4 (four) hours as needed for wheezing or shortness of breath.    . allopurinol (ZYLOPRIM) 100 MG tablet Take 100 mg by mouth daily.    Marland Kitchen amLODipine (NORVASC) 10 MG tablet Take 1 tablet by mouth daily.  0  . atorvastatin (LIPITOR) 80 MG tablet Take 40 mg by mouth at bedtime.  90 tablet 3  . Cyanocobalamin (VITAMIN B-12 IJ) Inject 1 mL as directed every 30 (thirty) days.     Marland Kitchen ELIQUIS 5 MG TABS tablet TAKE 1 TABLET(5 MG) BY MOUTH TWICE DAILY 180 tablet 1  . escitalopram (LEXAPRO) 10 MG tablet Take 10 mg by mouth every evening.   12  . HYDROcodone-acetaminophen (NORCO/VICODIN) 5-325 MG tablet Take 1 tablet by mouth every 6 (six) hours as needed for moderate pain.    Marland Kitchen insulin NPH-regular Human (NOVOLIN 70/30) (70-30) 100 UNIT/ML injection Inject 35-60 Units into the skin See admin instructions. Inject 35 units into the skin in the morning and 60 units in the evening    . Insulin Syringe-Needle U-100 (B-D INS SYR ULTRAFINE 1CC/31G) 31G X 5/16" 1 ML MISC by Does not apply route.    . loratadine (CLARITIN) 10 MG tablet Take 10 mg by mouth daily.    . metFORMIN (GLUCOPHAGE) 500 MG tablet Take 1,000-1,500 mg by mouth See admin instructions. Take 1,000 mg by mouth  in the morning and 1,500 mg in the evening    . pantoprazole (PROTONIX) 40 MG tablet Take 1 tablet (40 mg total) by mouth daily. 60 tablet 0  . SPIRIVA HANDIHALER 18 MCG inhalation capsule Place 18 mcg into inhaler and inhale daily.     Marland Kitchen torsemide (DEMADEX) 20 MG tablet Take 1 tablet (20 mg total) by mouth daily. 90 tablet 3  . TRIAMCINOLONE ACETONIDE EX Apply  1 application topically See admin instructions. Apply to both legs daily as directed    . triamterene-hydrochlorothiazide (MAXZIDE) 75-50 MG tablet Take 1 tablet by mouth daily.     No current facility-administered medications for this visit.     Allergies:   Nsaids and Penicillins    Social History:  The patient  reports that she has been smoking cigarettes. She has a 20.00 pack-year smoking history. She has never used smokeless tobacco. She reports that she drinks alcohol. She reports that she does not use drugs.   Family History:  The patient's family history includes Cancer in her mother; Colon polyps in her brother, brother, brother, brother, brother, and brother; Diabetes in her father; Heart attack in her father; Hypertension in her mother; Macular degeneration in her mother.    ROS:  Please see the history of present illness.   Physical Exam: Blood pressure (!) 136/58, pulse 100, height 5' 4.5" (1.638 m), weight 213 lb (96.6 kg), SpO2 98 %.  GEN:  Well nourished, well developed in no acute distress HEENT: Normal NECK: No JVD; No carotid bruits LYMPHATICS: No lymphadenopathy CARDIAC: RRR , no murmurs, rubs, gallops RESPIRATORY:  Clear to auscultation without rales, wheezing or rhonchi  ABDOMEN: Soft, non-tender, non-distended MUSCULOSKELETAL:  No edema; No deformity  SKIN: Warm and dry NEUROLOGIC:  Alert and oriented x 3   EKG:       Recent Labs: 01/29/2018: ALT 10 01/30/2018: BUN 38; Creatinine, Ser 2.62; Hemoglobin 8.4; Platelets 401; Potassium 4.1; Sodium 137    Lipid Panel    Component Value Date/Time    CHOL 111 01/30/2018 0843   CHOL 149 09/17/2017 1155   TRIG 169 (H) 01/30/2018 0843   HDL 27 (L) 01/30/2018 0843   HDL 33 (L) 09/17/2017 1155   CHOLHDL 4.1 01/30/2018 0843   VLDL 34 01/30/2018 0843   LDLCALC 50 01/30/2018 0843   LDLCALC 85 09/17/2017 1155      Wt Readings from Last 3 Encounters:  03/04/18 213 lb (96.6 kg)  01/29/18 228 lb (103.4 kg)  01/05/18 225 lb 6.4 oz (102.2 kg)      Other studies Reviewed: Additional studies/ records that were reviewed today include: . Review of the above records demonstrates:    ASSESSMENT AND PLAN:  1.   Atrial fib : Aspynn has chronic atrial fibrillation.  She is on Eliquis.  She has had lots of problems with anemia although she is not sure if she is having GI bleeding.  She denies having any bright red blood per stool or black tarry stools.  Scheduled to have endoscopy soon.  If she has conditions that predispose her for GI bleeding then we may need to discontinue the Eliquis.  I have explained to her that this would put her at increased risk for a stroke.  Discussed to discuss this further with her GI doctor and her primary care doctor.  Advised her to stop smoking which should help decrease her risk of stroke.  2.   acute on chronic diastolic congestive heart failure:   Stable.  2. Essential hypertension: Pressure is well controlled.    3. Claudication:      4. Hyperlipidemia:        5.  Chronic kidney disease:      Current medicines are reviewed at length with the patient today.  The patient does not have concerns regarding medicines.  Labs/ tests ordered today include:   No orders of the defined types were placed in this encounter.  Mertie Moores, MD  03/04/2018 6:22 PM    [facility] [place], [place], [place]  [address] Phone: [phone]; Fax: [phone]

## 2018-04-12 ENCOUNTER — Other Ambulatory Visit: Payer: Self-pay | Admitting: Cardiovascular Disease

## 2018-05-20 ENCOUNTER — Other Ambulatory Visit: Payer: Self-pay | Admitting: Cardiovascular Disease

## 2018-05-20 NOTE — Telephone Encounter (Signed)
Eliquis 5mg  refill request received; pt is 68 yrs old, wt-96.6kg, Crea-2.62 on 01/30/2018, last seen by Dr. Acie Fredrickson on 11/62019; will send in refill to requested pharmacy.

## 2018-06-23 ENCOUNTER — Encounter (HOSPITAL_COMMUNITY): Payer: 59

## 2018-06-23 ENCOUNTER — Ambulatory Visit: Payer: 59 | Admitting: Vascular Surgery

## 2018-07-28 ENCOUNTER — Ambulatory Visit: Payer: 59 | Admitting: Vascular Surgery

## 2018-07-28 ENCOUNTER — Encounter (HOSPITAL_COMMUNITY): Payer: 59

## 2018-09-04 ENCOUNTER — Other Ambulatory Visit: Payer: Self-pay | Admitting: Cardiovascular Disease

## 2018-09-04 NOTE — Telephone Encounter (Signed)
Pt's medication was sent to pt's pharmacy as requested. Confirmation received.  °

## 2018-09-25 ENCOUNTER — Telehealth: Payer: Self-pay | Admitting: Nurse Practitioner

## 2018-09-25 NOTE — Telephone Encounter (Signed)
Patient is scheduled for virtual visit with Dr. Acie Fredrickson on 6/9. She asks about checking bmet and CBC and I advised that if those have not been checked by another provider recently, that we can schedule a lab appointment for her at our office. She is going to call [facility] to find out if she will be getting lab work there soon and will call back to schedule lab appointment if needed. She thanked me for the call.   YOUR CARDIOLOGY TEAM HAS ARRANGED FOR AN E-VISIT FOR YOUR APPOINTMENT - PLEASE REVIEW IMPORTANT INFORMATION BELOW SEVERAL DAYS PRIOR TO YOUR APPOINTMENT  Due to the recent COVID-19 pandemic, we are transitioning in-person office visits to tele-medicine visits in an effort to decrease unnecessary exposure to our patients, their families, and staff. These visits are billed to your insurance just like a normal visit is. We also encourage you to sign up for MyChart if you have not already done so. You will need a smartphone if possible. For patients that do not have this, we can still complete the visit using a regular telephone but do prefer a smartphone to enable video when possible. You may have a family member that lives with you that can help. If possible, we also ask that you have a blood pressure cuff and scale at home to measure your blood pressure, heart rate and weight prior to your scheduled appointment. Patients with clinical needs that need an in-person evaluation and testing will still be able to come to the office if absolutely necessary. If you have any questions, feel free to call our office.   YOUR PROVIDER WILL BE USING THE FOLLOWING PLATFORM TO COMPLETE YOUR VISIT: Doxy.Me   . IF USING DOXIMITY or [email or web address].ME - The staff will give you instructions on receiving your link to join the meeting the day of your visit.     2-3 DAYS BEFORE YOUR APPOINTMENT  You will receive a telephone call from one of our [place] team members - your caller ID may say "Unknown caller." If this is  a video visit, we will walk you through how to get the video launched on your phone. We will remind you check your blood pressure, heart rate and weight prior to your scheduled appointment. If you have an Apple Watch or Kardia, please upload any pertinent ECG strips the day before or morning of your appointment to [facility]. Our staff will also make sure you have reviewed the consent and agree to move forward with your scheduled tele-health visit.    THE DAY OF YOUR APPOINTMENT  Approximately 15 minutes prior to your scheduled appointment, you will receive a telephone call from one of [place] team - your caller ID may say "Unknown caller."  Our staff will confirm medications, vital signs for the day and any symptoms you may be experiencing. Please have this information available prior to the time of visit start. It may also be helpful for you to have a pad of paper and pen handy for any instructions given during your visit. They will also walk you through joining the smartphone meeting if this is a video visit.   CONSENT FOR TELE-HEALTH VISIT - PLEASE REVIEW  I hereby voluntarily request, consent and authorize [facility] and its employed or contracted physicians, physician assistants, nurse practitioners or other licensed health care professionals (the Practitioner), to provide me with telemedicine health care services (the "Services") as deemed necessary by the treating Practitioner. I acknowledge and consent to receive the Services by the  Practitioner via telemedicine. I understand that the telemedicine visit will involve communicating with the Practitioner through live audiovisual communication technology and the disclosure of certain medical information by electronic transmission. I acknowledge that I have been given the opportunity to request an in-person assessment or other available alternative prior to the telemedicine visit and am voluntarily participating in the telemedicine visit.  I  understand that I have the right to withhold or withdraw my consent to the use of telemedicine in the course of my care at any time, without affecting my right to future care or treatment, and that the Practitioner or I may terminate the telemedicine visit at any time. I understand that I have the right to inspect all information obtained and/or recorded in the course of the telemedicine visit and may receive copies of available information for a reasonable fee.  I understand that some of the potential risks of receiving the Services via telemedicine include:  Marland Kitchen Delay or interruption in medical evaluation due to technological equipment failure or disruption; . Information transmitted may not be sufficient (e.g. poor resolution of images) to allow for appropriate medical decision making by the Practitioner; and/or  . In rare instances, security protocols could fail, causing a breach of personal health information.  Furthermore, I acknowledge that it is my responsibility to provide information about my medical history, conditions and care that is complete and accurate to the best of my ability. I acknowledge that Practitioner's advice, recommendations, and/or decision may be based on factors not within their control, such as incomplete or inaccurate data provided by me or distortions of diagnostic images or specimens that may result from electronic transmissions. I understand that the practice of medicine is not an exact science and that Practitioner makes no warranties or guarantees regarding treatment outcomes. I acknowledge that I will receive a copy of this consent concurrently upon execution via email to the email address I last provided but may also request a printed copy by calling the office of [facility].    I understand that my insurance will be billed for this visit.   I have read or had this consent read to me. . I understand the contents of this consent, which adequately explains the  benefits and risks of the Services being provided via telemedicine.  . I have been provided ample opportunity to ask questions regarding this consent and the Services and have had my questions answered to my satisfaction. . I give my informed consent for the services to be provided through the use of telemedicine in my medical care  By participating in this telemedicine visit I agree to the above.

## 2018-10-04 NOTE — Progress Notes (Signed)
Virtual Visit via Telephone Note   This visit type was conducted due to national recommendations for restrictions regarding the COVID-19 Pandemic (e.g. social distancing) in an effort to limit this patient's exposure and mitigate transmission in our community.  Due to her co-morbid illnesses, this patient is at least at moderate risk for complications without adequate follow up.  This format is felt to be most appropriate for this patient at this time.  The patient did not have access to video technology/had technical difficulties with video requiring transitioning to audio format only (telephone).  All issues noted in this document were discussed and addressed.  No physical exam could be performed with this format.  Please refer to the patient's chart for her  consent to telehealth for [facility].   Date:  10/06/2018   ID:  Paula Wheeler, DOB 03-26-1951, MRN [id number]  Patient Location: Home Provider Location: Home  PCP:  Hulan Fess, MD  Cardiologist:  Mertie Moores, MD  Electrophysiologist:  None   Evaluation Performed:  Follow-Up Visit  Chief Complaint:     Atrial fib   Problem list 1. Essential HTN  2. Hyperlipidemia 3. Cardiomegaly on x-ray 4. Diabetes mellitus 5. Obesity  6. Atrial fib -CHADS2VASC score is 33 ( female, age 68, HTN, PVD)     Nov. 13, 2017: Paula Wheeler is a 68 y.o. female who presents for evaluation of cardiomegaly She requested a CXR to look for lung issues ( is a current smoker) was found have cardiac megaly.  Her main complaint is that her legs give out when she is walking. She does not get any regular exercise.  No CP.   Some shortness of breath with exertion  Has DM,   She is getting better control of her glucose levels recently .    She admits that she eats more salt that she probably should. She's canned soup was on occasions. She put salt on her apples.  Feb. 12, 2018:  Pt was seen by Dr. Fletcher Anon for PVD. Was found to  have atrial fib.   CHADS2VASC score is 87 ( female, age 23, HTN, PVD)  Started on Eliquis. Developed a GI bleed.  EGD shows erosive gastropathy.   She was told to stop her Goodies powders and Indomethicin ( which she was taking for gout )  She has seen Dr. Oletta Lamas -  She decided to reduce her Eliquis to 5 mg a day .    July 25, 2016:  Pt is seen back for follow Up of her atrial fibrillation. She was started on L course but she developed a GI bleed. She had been taking lots of goodies powders and indomethacin for gout.  July 25, 2016:  No symptoms related to atrial fib. Still smoking.  Her hands are cold at night  BP is up today .  Her Quinipril was reduced to once a day when she was in the hospital in jan. 2018.   October 28, 2016:  Was cardioverted on April 11 Unfortunately, is back in atrial fib She cannot tell that her HR is irreg.  Remains on Eliquis   Echo from 12. /17 showed normal Lv systolic function   Still smoking   18-Apr-2017:  Mother died recently . No CP or dyspnea  Hx of atria fib   Sep 17, 2017:  Makari is seen for follow up of atrial fib  Is having difficulty breathing and her legs are swollen  Dyspnea for the past 3  weeks.  Not associated with any CP Has cut back on her smoking  Wt today is 226.  ( up 15 lbs since Dec. 2018)  Has lack of energy . Seems to feel better when she is on prednisone   She is in persistent atrial fibrillation.  We cardioverted her about a year ago but it only lasted for several weeks.  We discussed going to the A. fib clinic for consideration of Tikosyn therapy.  January 05, 2018: Seen today for follow-up of her atrial fibrillation, hypertension hyperlipidemia. Was started on Pletal - has not started it until she cleared it with me  Still smoking  Previously, we had her on losartan but it affected her renal function.  We also had her on some amlodipine but this caused significant leg edema.  She has  stopped both of these medications and her blood pressure remains well controlled.  March 04, 2018: [name] is seen today for follow-up of her atrial fibrillation.  She also has hypertension and hyperlipidemia.  She has been anemic. Hemoglobin was 5.8.  She received 3 units packed red blood cells.  She has chronic kidney disease stage IV. She had not noticed any blood in her stool or black tarry stool   October 05, 2018    Paula Wheeler is a 68 y.o. female with hx of Atrial fib and anemia .  She has stabe IV CKD .   Seems to be doing well.   No CP or dyspnea  Has iron defecient anemia ( and anemia with CKD)  Hb has improved.   BP is a bit elevated today  Typical BP is ok  Cannot tell if her HR is irreg.  Doing well Staying safe.   Wears mask.      The patient does not have symptoms concerning for COVID-19 infection (fever, chills, cough, or new shortness of breath).    Past Medical History:  Diagnosis Date  . Anemia   . COPD (chronic obstructive pulmonary disease) ([place])   . Dyslipidemia   . Exogenous obesity   . GERD (gastroesophageal reflux disease)   . GI bleed 05/14/2016  . History of abnormal cells from cervix   . Hyperlipidemia   . Hypertension   . Iron deficiency anemia   . Longstanding persistent atrial fibrillation   . Microalbuminuria   . Tobacco dependence   . Type II diabetes mellitus ([place])    Past Surgical History:  Procedure Laterality Date  . CARDIOVERSION N/A 08/07/2016   Procedure: CARDIOVERSION;  Surgeon: Thayer Headings, MD;  Location: [place];  Service: Cardiovascular;  Laterality: N/A;  . DIAGNOSTIC MAMMOGRAM  05/2015  . ESOPHAGOGASTRODUODENOSCOPY N/A 05/15/2016   Procedure: ESOPHAGOGASTRODUODENOSCOPY (EGD);  Surgeon: Wonda Horner, MD;  [facility] END[facility]COPY;  Service: Endoscopy;  Laterality: N/A;  . [place]   "I think they left my appendix"  . PAP SMEAR  2012 AND 2017     Current Meds  Medication Sig  .  albuterol (PROVENTIL HFA;VENTOLIN HFA) 108 (90 Base) MCG/ACT inhaler Inhale 2 puffs into the lungs every 4 (four) hours as needed for wheezing or shortness of breath.  . allopurinol (ZYLOPRIM) 100 MG tablet Take 100 mg by mouth daily.  Marland Kitchen amLODipine (NORVASC) 10 MG tablet TAKE 1 TABLET(10 MG) BY MOUTH DAILY  . apixaban (ELIQUIS) 5 MG TABS tablet Take 1 tablet (5 mg total) by mouth 2 (two) times daily.  Marland Kitchen atorvastatin (LIPITOR) 40 MG tablet Take 1 tablet (40 mg total)  by mouth at bedtime.  . Cyanocobalamin (VITAMIN B-12 IJ) Inject 1 mL as directed every 30 (thirty) days.   Marland Kitchen escitalopram (LEXAPRO) 10 MG tablet Take 10 mg by mouth every evening.   Marland Kitchen HYDROcodone-acetaminophen (NORCO/VICODIN) 5-325 MG tablet Take 1 tablet by mouth every 6 (six) hours as needed for moderate pain.  Marland Kitchen insulin NPH-regular Human (NOVOLIN 70/30) (70-30) 100 UNIT/ML injection Inject 35-60 Units into the skin See admin instructions. Inject 35 units into the skin in the morning and 60 units in the evening  . Insulin Syringe-Needle U-100 (B-D INS SYR ULTRAFINE 1CC/31G) 31G X 5/16" 1 ML MISC by Does not apply route.  . loratadine (CLARITIN) 10 MG tablet Take 10 mg by mouth daily.  . metFORMIN (GLUCOPHAGE) 500 MG tablet Take 1,000-1,500 mg by mouth See admin instructions. Take 1,000 mg by mouth in the morning and 1,500 mg in the evening  . pantoprazole (PROTONIX) 40 MG tablet Take 1 tablet (40 mg total) by mouth daily.  Marland Kitchen SPIRIVA HANDIHALER 18 MCG inhalation capsule Place 18 mcg into inhaler and inhale daily.   Marland Kitchen torsemide (DEMADEX) 20 MG tablet TAKE 1 TABLET(20 MG) BY MOUTH DAILY  . TRIAMCINOLONE ACETONIDE EX Apply 1 application topically See admin instructions. Apply to both legs daily as directed  . triamterene-hydrochlorothiazide (MAXZIDE) 75-50 MG tablet Take 1 tablet by mouth daily.  . [DISCONTINUED] atorvastatin (LIPITOR) 80 MG tablet Take 40 mg by mouth at bedtime.   . [DISCONTINUED] ELIQUIS 5 MG TABS tablet TAKE 1 TABLET(5  MG) BY MOUTH TWICE DAILY     Allergies:   Nsaids and Penicillins   Social History   Tobacco Use  . Smoking status: Current Every Day Smoker    Packs/day: 0.50    Years: 40.00    Pack years: 20.00    Types: Cigarettes  . Smokeless tobacco: Never Used  Substance Use Topics  . Alcohol use: Yes    Comment: 05/14/2016 "I'll have a drink once/year; New Year's"  . Drug use: No     Family Hx: The patient's family history includes Cancer in her mother; Colon polyps in her brother, brother, brother, brother, brother, and brother; Diabetes in her father; Heart attack in her father; Hypertension in her mother; Macular degeneration in her mother.  ROS:   Please see the history of present illness.     All other systems reviewed and are negative.   Prior CV studies:   The following studies were reviewed today:    Labs/Other Tests and Data Reviewed:    EKG:  No ECG reviewed.  Recent Labs: 01/29/2018: ALT 10 01/30/2018: BUN 38; Creatinine, Ser 2.62; Hemoglobin 8.4; Platelets 401; Potassium 4.1; Sodium 137   Recent Lipid Panel Lab Results  Component Value Date/Time   CHOL 111 01/30/2018 08:43 AM   CHOL 149 09/17/2017 11:55 AM   TRIG 169 (H) 01/30/2018 08:43 AM   HDL 27 (L) 01/30/2018 08:43 AM   HDL 33 (L) 09/17/2017 11:55 AM   CHOLHDL 4.1 01/30/2018 08:43 AM   LDLCALC 50 01/30/2018 08:43 AM   LDLCALC 85 09/17/2017 11:55 AM    Wt Readings from Last 3 Encounters:  10/06/18 223 lb (101.2 kg)  03/04/18 213 lb (96.6 kg)  01/29/18 228 lb (103.4 kg)     Objective:    Vital Signs:  BP 140/78 (BP Location: Left Arm, Patient Position: Sitting, Cuff Size: Normal)   Ht 5\' 5"  (1.651 m)   Wt 223 lb (101.2 kg)   BMI 37.11 kg/m  ASSESSMENT & PLAN:    1. Persistent atrial fib :  Continue eliquis.   HR is well controlled.    2.   HTN:  BP is generally well controlled.   New current medications.  She will keep a blood pressure log and will call us if her blood pressure is  elevated.  3.   Hyperlipidemia:   Continue atorvastatin.   Check lipds when I see her in 6 months   COVID-19 Education: The signs and symptoms of COVID-19 were discussed with the patient and how to seek care for testing (follow up with PCP or arrange E-visit).  The importance of social distancing was discussed today.  Time:   Today, I have spent  18  minutes with the patient with telehealth technology discussing the above problems.     Medication Adjustments/Labs and Tests Ordered: Current medicines are reviewed at length with the patient today.  Concerns regarding medicines are outlined above.   Tests Ordered: Orders Placed This Encounter  Procedures  . Lipid Profile  . Basic Metabolic Panel (BMET)  . Hepatic function panel    Medication Changes: Meds ordered this encounter  Medications  . apixaban (ELIQUIS) 5 MG TABS tablet    Sig: Take 1 tablet (5 mg total) by mouth 2 (two) times daily.    Dispense:  180 tablet    Refill:  3  . atorvastatin (LIPITOR) 40 MG tablet    Sig: Take 1 tablet (40 mg total) by mouth at bedtime.    Dispense:  90 tablet    Refill:  3    Disposition:  Follow up in 6 month(s)  Signed, Mertie Moores, MD  10/06/2018 8:39 AM    [facility]

## 2018-10-06 ENCOUNTER — Encounter: Payer: Self-pay | Admitting: Cardiovascular Disease

## 2018-10-06 ENCOUNTER — Telehealth (INDEPENDENT_AMBULATORY_CARE_PROVIDER_SITE_OTHER): Payer: 59 | Admitting: Cardiovascular Disease

## 2018-10-06 ENCOUNTER — Other Ambulatory Visit: Payer: Self-pay

## 2018-10-06 VITALS — BP 140/78 | Ht 65.0 in | Wt 210.0 lb

## 2018-10-06 DIAGNOSIS — Z7189 Other specified counseling: Secondary | ICD-10-CM | POA: Diagnosis not present

## 2018-10-06 DIAGNOSIS — I4891 Unspecified atrial fibrillation: Secondary | ICD-10-CM

## 2018-10-06 DIAGNOSIS — I482 Chronic atrial fibrillation, unspecified: Secondary | ICD-10-CM

## 2018-10-06 DIAGNOSIS — I739 Peripheral vascular disease, unspecified: Secondary | ICD-10-CM

## 2018-10-06 DIAGNOSIS — I10 Essential (primary) hypertension: Secondary | ICD-10-CM

## 2018-10-06 DIAGNOSIS — E782 Mixed hyperlipidemia: Secondary | ICD-10-CM

## 2018-10-06 MED ORDER — APIXABAN 5 MG PO TABS: 5.0000 mg | ORAL_TABLET | Freq: Two times a day (BID) | ORAL | 3 refills | Status: DC

## 2018-10-06 MED ORDER — ATORVASTATIN CALCIUM 40 MG PO TABS: 40.0000 mg | ORAL_TABLET | Freq: Every day | ORAL | 3 refills | Status: DC

## 2018-10-06 NOTE — Patient Instructions (Signed)

## 2018-10-08 ENCOUNTER — Telehealth: Payer: Self-pay | Admitting: Cardiovascular Disease

## 2018-10-08 NOTE — Telephone Encounter (Signed)
She may be ok with coreg but I agree there are more cardioselective beta blockers Lets try bisoprolol 5 mg a day and titrate up as needed

## 2018-10-08 NOTE — Telephone Encounter (Signed)
New message   Pt c/o medication issue:  1. Name of Medication: carvedilol 6.25 mg 2. How are you currently taking this medication (dosage and times per day)?  1 tablet twice daily  3. Are you having a reaction (difficulty breathing--STAT)?no   4. What is your medication issue? Patient wants to know if she can take this medication since the patient states that she has AFIB.

## 2018-10-08 NOTE — Telephone Encounter (Signed)
Spoke with patient who states she was prescribed carvedilol by PCP, Dr. Rex Kras a few weeks ago but when she went to pick up Rx, pharmacist told her she should not be taking it. Patient has hx of COPD so I advised that may have been the reason she was advised not to take it.  Patient reports also that correct weight for her is 210 lb, her home scale was incorrect.   Patient saw her nephrologist yesterday and BP was 158/689 mmHg and heart rate was "fast." Nephrologist advised her to call us about starting carvedilol. I advised that patient may need metoprolol instead and I will forward message to Dr. Acie Fredrickson for advice. Patient verbalized understanding and agreement and thanked me for the call.

## 2018-10-09 MED ORDER — BISOPROLOL FUMARATE 5 MG PO TABS: 5.0000 mg | ORAL_TABLET | Freq: Every day | ORAL | 11 refills | Status: DC

## 2018-10-09 NOTE — Telephone Encounter (Signed)
Reviewed Dr. Elmarie Shiley advice with patient who verbalized agreement with plan. She will continue to monitor BP and will call back with questions or concerns. She is aware I am sending #30 pills for initiation of treatment and will call back or request #90 at a later time if well tolerated. She thanked me for the call.

## 2019-02-09 IMAGING — DX DG CHEST 2V
2 series · 2 of 2 positions shown · non-contrast
Comparison: 01/28/2018.

CLINICAL DATA: Shortness of breath. Anemia. Smoker.

EXAM:
CHEST - 2 VIEW

[w chest lat]
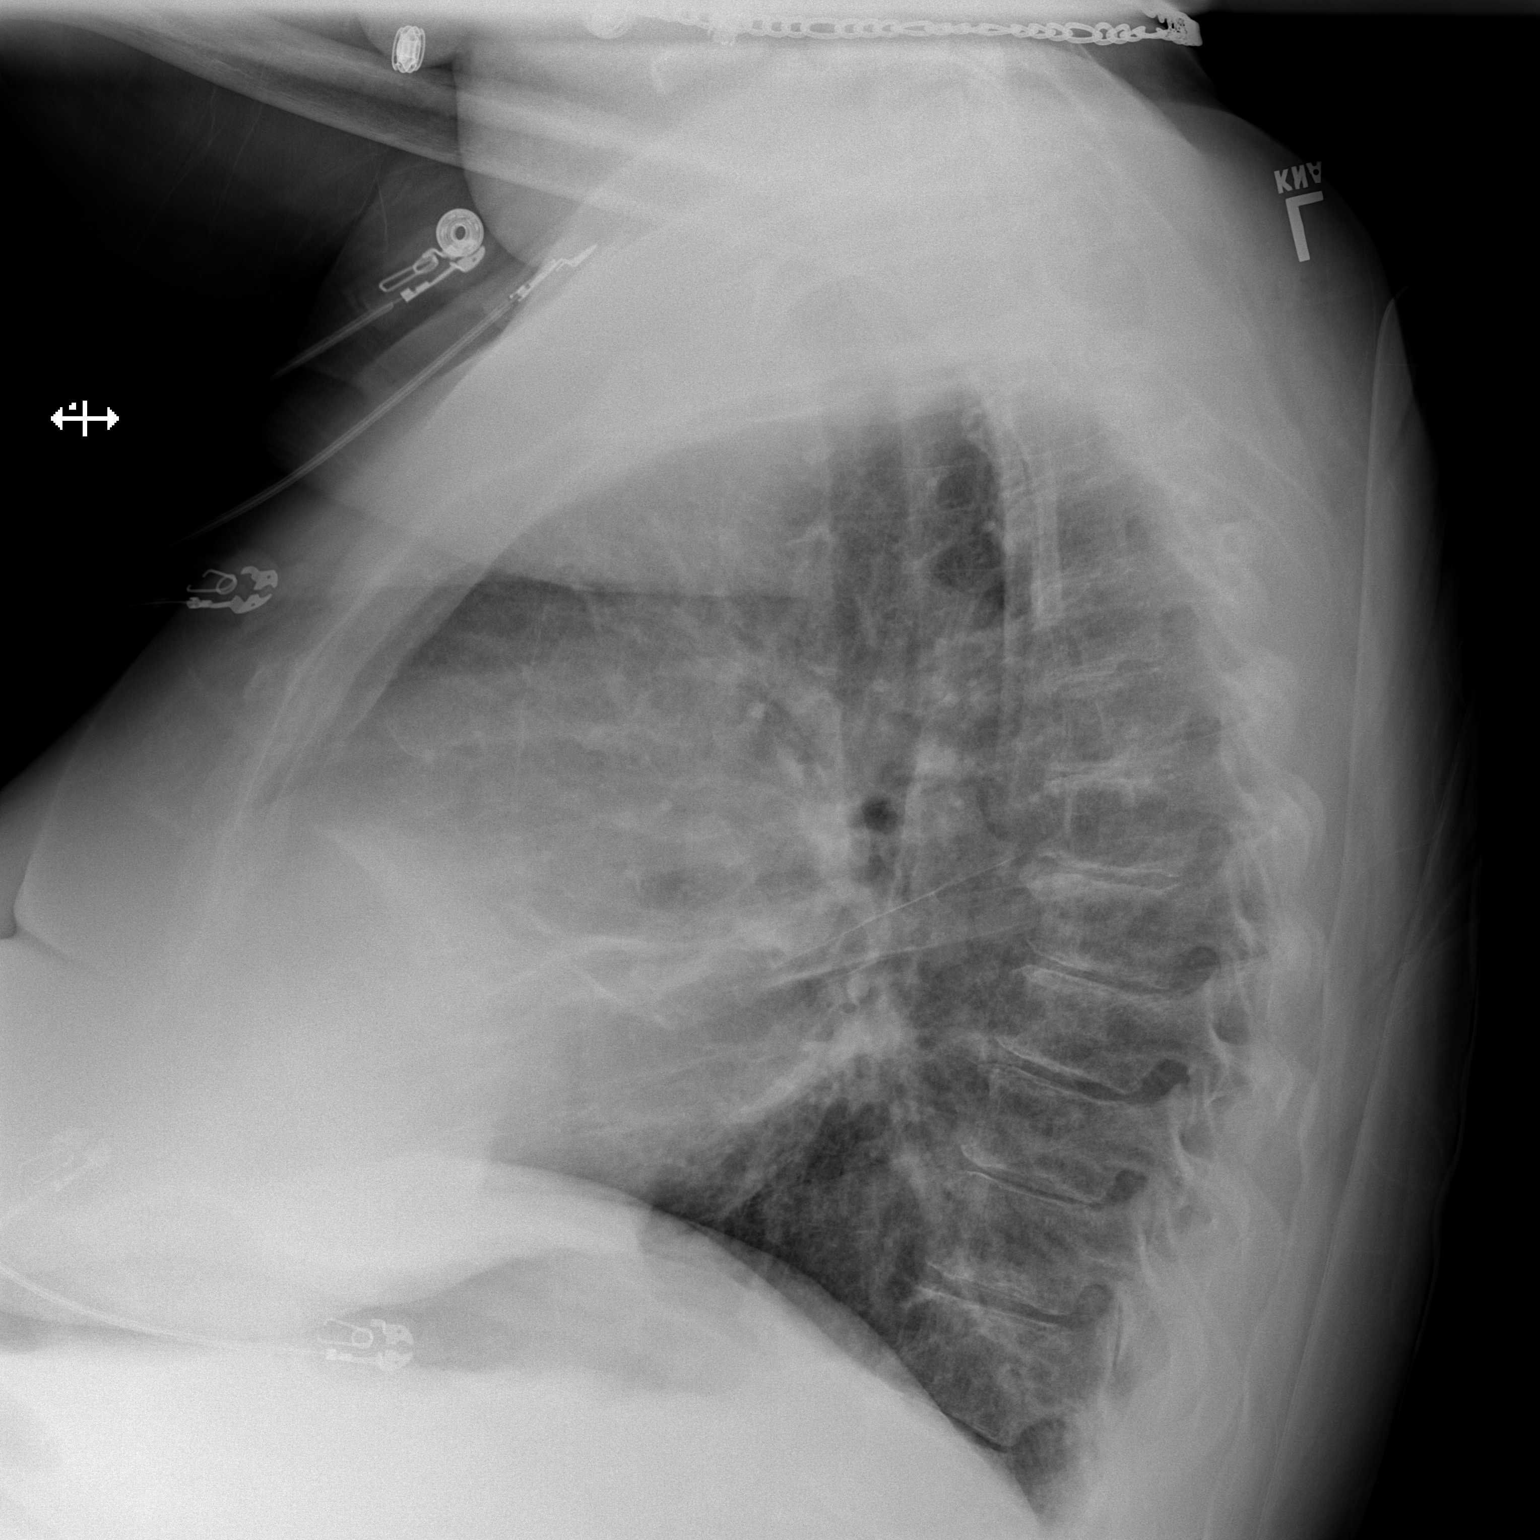

[x chest ap]
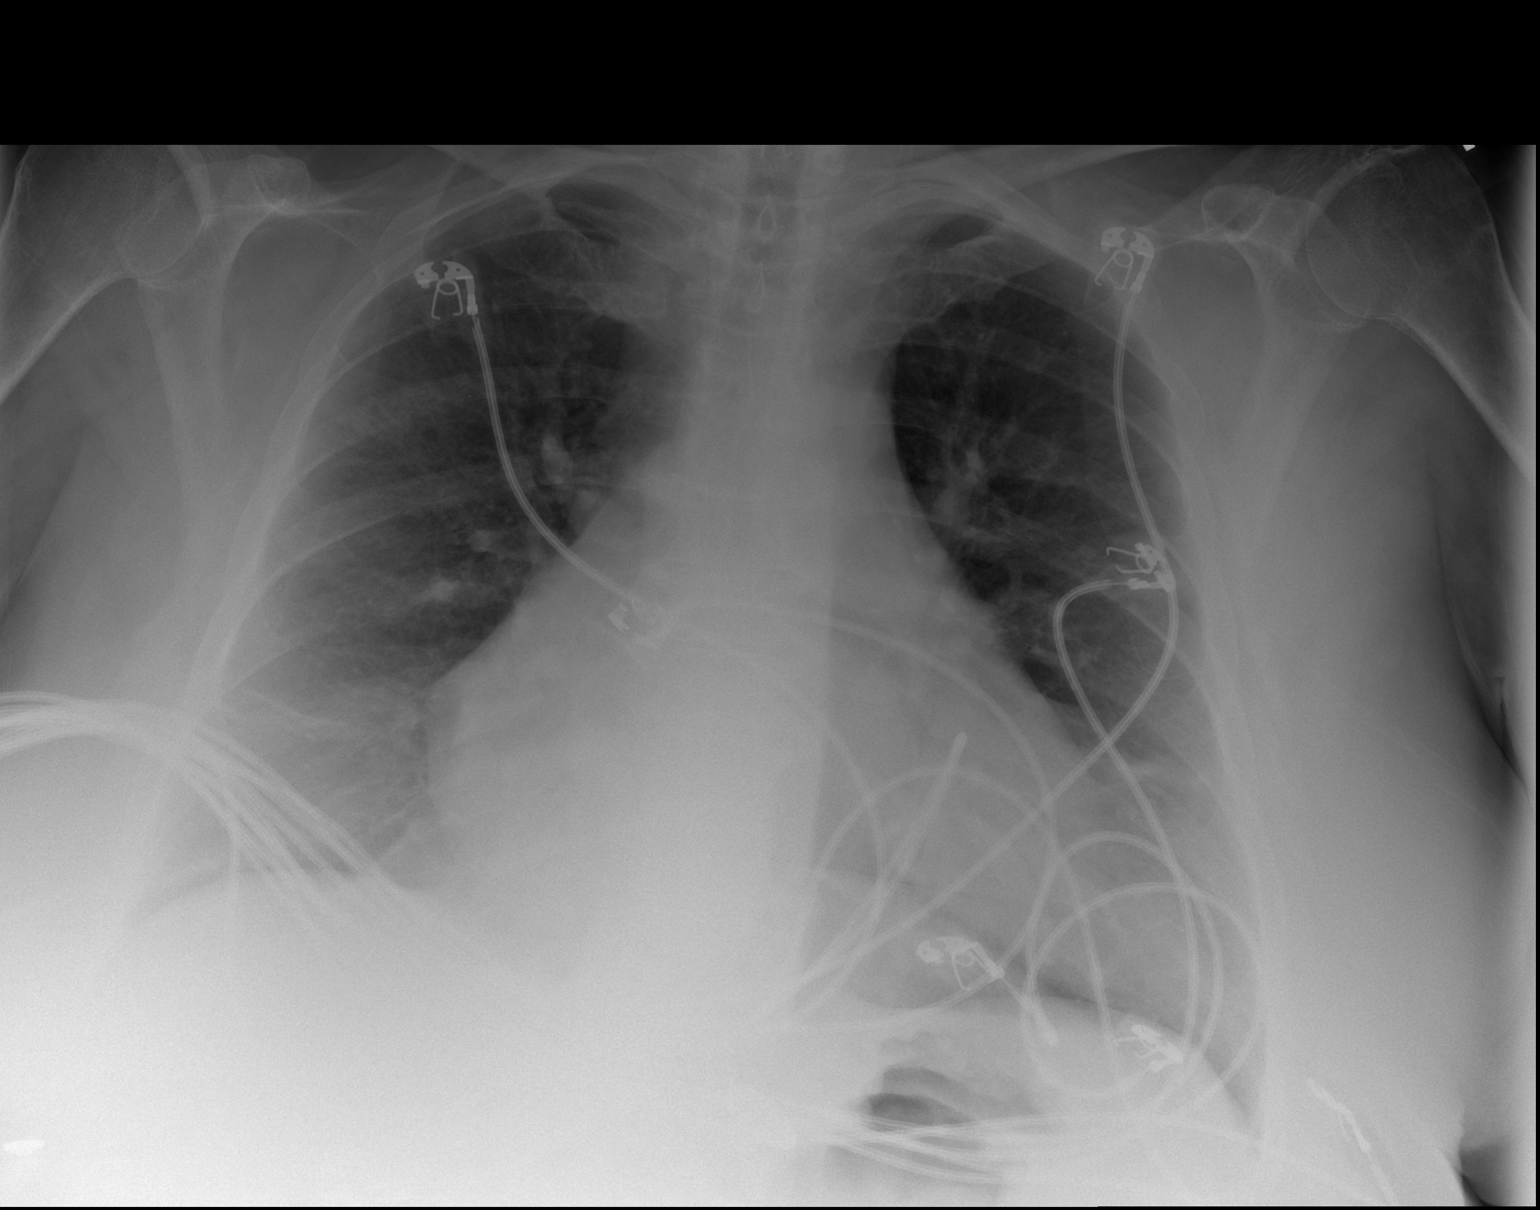

[2 of 2 positions shown; findings below may reference images not displayed]

FINDINGS: Stable enlarged cardiac silhouette and linear scarring in both lower
lung zones. Clear lungs with normal vascularity. Mild thoracic spine
degenerative changes.
IMPRESSION: No acute abnormality.  Stable cardiomegaly.

## 2019-03-04 ENCOUNTER — Other Ambulatory Visit: Payer: Self-pay | Admitting: Cardiovascular Disease

## 2019-04-14 ENCOUNTER — Other Ambulatory Visit: Payer: Self-pay | Admitting: Cardiovascular Disease

## 2019-05-10 NOTE — Progress Notes (Signed)
Cardiology Office Note   Date:  05/11/2019   ID:  LOREN SHIPLETT, DOB 12-06-1950, MRN [id number]  PCP:  Hulan Fess, MD  Cardiologist:   Mertie Moores, MD   Chief Complaint  Patient presents with  . Atrial Fibrillation  . Hypertension   Problem list 1. Essential HTN  2. Hyperlipidemia 3. Cardiomegaly on x-ray 4. Diabetes mellitus 5. Obesity  6. Atrial fib -CHADS2VASC score is 61 ( female, age 69, HTN, PVD)     Nov. 13, 2017: TACEY EAKES is a 69 y.o. female who presents for evaluation of cardiomegaly She requested a CXR to look for lung issues ( is a current smoker) was found have cardiac megaly.  Her main complaint is that her legs give out when she is walking. She does not get any regular exercise.  No CP.   Some shortness of breath with exertion  Has DM,   She is getting better control of her glucose levels recently .    She admits that she eats more salt that she probably should. She's canned soup was on occasions. She put salt on her apples.  Feb. 12, 2018:  Pt was seen by Dr. Fletcher Anon for PVD. Was found to have atrial fib.   CHADS2VASC score is 84 ( female, age 58, HTN, PVD)  Started on Eliquis. Developed a GI bleed.  EGD shows erosive gastropathy.   She was told to stop her Goodies powders and Indomethicin ( which she was taking for gout )  She has seen Dr. Oletta Lamas -  She decided to reduce her Eliquis to 5 mg a day .    July 25, 2016:  Pt is seen back for follow Up of her atrial fibrillation. She was started on L course but she developed a GI bleed. She had been taking lots of goodies powders and indomethacin for gout.  July 25, 2016:  No symptoms related to atrial fib. Still smoking.  Her hands are cold at night  BP is up today .  Her Quinipril was reduced to once a day when she was in the hospital in jan. 2018.   October 28, 2016:  Was cardioverted on April 11 Unfortunately, is back in atrial fib She cannot tell that her HR is irreg.    Remains on Eliquis   Echo from 12. /17 showed normal Lv systolic function   Still smoking   April 25, 2017:  Mother died recently . No CP or dyspnea  Hx of atria fib   Sep 17, 2017:  Seresa is seen for follow up of atrial fib  Is having difficulty breathing and her legs are swollen  Dyspnea for the past 3 weeks.  Not associated with any CP Has cut back on her smoking  Wt today is 226.  ( up 15 lbs since Dec. 2018)  Has lack of energy . Seems to feel better when she is on prednisone   She is in persistent atrial fibrillation.  We cardioverted her about a year ago but it only lasted for several weeks.  We discussed going to the A. fib clinic for consideration of Tikosyn therapy.  January 05, 2018: Seen today for follow-up of her atrial fibrillation, hypertension hyperlipidemia. Was started on Pletal - has not started it until she cleared it with me  Still smoking  Previously, we had her on losartan but it affected her renal function.  We also had her on some amlodipine but this caused significant leg edema.  She  has stopped both of these medications and her blood pressure remains well controlled.  March 04, 2018: Austine is seen today for follow-up of her atrial fibrillation.  She also has hypertension and hyperlipidemia.  She has been anemic. Hemoglobin was 5.8.  She received 3 units packed red blood cells.  She has chronic kidney disease stage IV. She had not noticed any blood in her stool or black tarry stool   Is scheduled for EGD and colonoscopy Nov. 14,  Our    Jan. 12, 2021 Patrisha is seen today for follow up of her atrial fib,  HTN, hyperlipidemia. Still smoking .   Is in Afib   Past Medical History:  Diagnosis Date  . Anemia   . COPD (chronic obstructive pulmonary disease) ([place])   . Dyslipidemia   . Exogenous obesity   . GERD (gastroesophageal reflux disease)   . GI bleed 05/14/2016  . History of abnormal cells from cervix   . Hyperlipidemia    . Hypertension   . Iron deficiency anemia   . Longstanding persistent atrial fibrillation ([place])   . Microalbuminuria   . Tobacco dependence   . Type II diabetes mellitus ([place])     Past Surgical History:  Procedure Laterality Date  . CARDIOVERSION N/A 08/07/2016   Procedure: CARDIOVERSION;  Surgeon: Thayer Headings, MD;  Location: [place];  Service: Cardiovascular;  Laterality: N/A;  . DIAGNOSTIC MAMMOGRAM  05/2015  . ESOPHAGOGASTRODUODENOSCOPY N/A 05/15/2016   Procedure: ESOPHAGOGASTRODUODENOSCOPY (EGD);  Surgeon: Wonda Horner, MD;  Location: [facility] ENDOSCOPY;  Service: Endoscopy;  Laterality: N/A;  . [place]   "I think they left my appendix"  . PAP SMEAR  2012 AND 2017     Current Outpatient Medications  Medication Sig Dispense Refill  . albuterol (PROVENTIL HFA;VENTOLIN HFA) 108 (90 Base) MCG/ACT inhaler Inhale 2 puffs into the lungs every 4 (four) hours as needed for wheezing or shortness of breath.    . allopurinol (ZYLOPRIM) 100 MG tablet Take 100 mg by mouth daily.    Marland Kitchen amLODipine (NORVASC) 10 MG tablet TAKE 1 TABLET(10 MG) BY MOUTH DAILY 90 tablet 1  . apixaban (ELIQUIS) 5 MG TABS tablet Take 1 tablet (5 mg total) by mouth 2 (two) times daily. 180 tablet 3  . atorvastatin (LIPITOR) 40 MG tablet Take 1 tablet (40 mg total) by mouth at bedtime. 90 tablet 3  . bisoprolol (ZEBETA) 5 MG tablet Take 1 tablet (5 mg total) by mouth daily. 30 tablet 11  . Cyanocobalamin (VITAMIN B-12 IJ) Inject 1 mL as directed every 30 (thirty) days.     Marland Kitchen escitalopram (LEXAPRO) 10 MG tablet Take 10 mg by mouth every evening.   12  . HYDROcodone-acetaminophen (NORCO/VICODIN) 5-325 MG tablet Take 1 tablet by mouth every 6 (six) hours as needed for moderate pain.    Marland Kitchen insulin NPH-regular Human (NOVOLIN 70/30) (70-30) 100 UNIT/ML injection Inject 35-60 Units into the skin See admin instructions. Inject 35 units into the skin in the morning and 60 units in the evening    . Insulin  Syringe-Needle U-100 (B-D INS SYR ULTRAFINE 1CC/31G) 31G X 5/16" 1 ML MISC by Does not apply route.    . loratadine (CLARITIN) 10 MG tablet Take 10 mg by mouth daily.    . metFORMIN (GLUCOPHAGE) 500 MG tablet Take 1,000-1,500 mg by mouth See admin instructions. Take 1,000 mg by mouth in the morning and 1,500 mg in the evening    . pantoprazole (PROTONIX) 40 MG  tablet Take 1 tablet (40 mg total) by mouth daily. 60 tablet 0  . SPIRIVA HANDIHALER 18 MCG inhalation capsule Place 18 mcg into inhaler and inhale daily.     Marland Kitchen torsemide (DEMADEX) 20 MG tablet TAKE 1 TABLET(20 MG) BY MOUTH DAILY 90 tablet 2  . TRIAMCINOLONE ACETONIDE EX Apply 1 application topically See admin instructions. Apply to both legs daily as directed    . triamterene-hydrochlorothiazide (MAXZIDE) 75-50 MG tablet Take 1 tablet by mouth daily.     No current facility-administered medications for this visit.    Allergies:   Nsaids and Penicillins    Social History:  The patient  reports that she has been smoking cigarettes. She has a 20.00 pack-year smoking history. She has never used smokeless tobacco. She reports current alcohol use. She reports that she does not use drugs.   Family History:  The patient's family history includes Cancer in her mother; Colon polyps in her brother, brother, brother, brother, brother, and brother; Diabetes in her father; Heart attack in her father; Hypertension in her mother; Macular degeneration in her mother.    ROS:  Please see the history of present illness.   Physical Exam: Blood pressure (!) 146/76, pulse 96, height 5\' 4"  (1.626 m), weight 218 lb 12.8 oz (99.2 kg), SpO2 98 %.  GEN:  Middle age female,  HEENT: Normal NECK: No JVD; No carotid bruits LYMPHATICS: No lymphadenopathy CARDIAC: Irreg. Irreg.   RESPIRATORY:  Clear to auscultation without rales, wheezing or rhonchi  ABDOMEN: Soft, non-tender, non-distended MUSCULOSKELETAL:   Right leg edema  SKIN: Warm and dry NEUROLOGIC:   Alert and oriented x 3   EKG:   May 20, 2019: Atrial fibrillation with a heart rate of 97.  Nonspecific ST and T wave abnormalities.    Recent Labs: No results found for requested labs within last 8760 hours.    Lipid Panel    Component Value Date/Time   CHOL 111 01/30/2018 0843   CHOL 149 09/17/2017 1155   TRIG 169 (H) 01/30/2018 0843   HDL 27 (L) 01/30/2018 0843   HDL 33 (L) 09/17/2017 1155   CHOLHDL 4.1 01/30/2018 0843   VLDL 34 01/30/2018 0843   LDLCALC 50 01/30/2018 0843   LDLCALC 85 09/17/2017 1155      Wt Readings from Last 3 Encounters:  05/11/19 218 lb 12.8 oz (99.2 kg)  10/06/18 210 lb (95.3 kg)  03/04/18 213 lb (96.6 kg)      Other studies Reviewed: Additional studies/ records that were reviewed today include: . Review of the above records demonstrates:    ASSESSMENT AND PLAN:  1.   Atrial fib : chronic ,  Cont. eliquis .  HR is generally controlled.   2.   acute on chronic diastolic congestive heart failure:   Fairly well controlled.   2. Essential hypertension:  Still eats salty foods ( country ham, canned foods)  Advised her to avoid salt. Unable to start ARB due to CKD.   3. Claudication:   Advised smoking cessation     4. Hyperlipidemia:    Trigs are very high.   Recheck today     5.  Chronic kidney disease:    - check BMP today   Current medicines are reviewed at length with the patient today.  The patient does not have concerns regarding medicines.  Labs/ tests ordered today include:   Orders Placed This Encounter  Procedures  . Lipid Profile  . Basic Metabolic Panel (BMET)  .  Hepatic function panel  . EKG 12-Lead     Mertie Moores, MD  05/11/2019 8:27 AM    [facility] [place], [place], [place]  [address] Phone: [phone]; Fax: [phone]

## 2019-05-11 ENCOUNTER — Other Ambulatory Visit: Payer: Self-pay

## 2019-05-11 ENCOUNTER — Ambulatory Visit (INDEPENDENT_AMBULATORY_CARE_PROVIDER_SITE_OTHER): Payer: BC Managed Care – PPO | Admitting: Cardiovascular Disease

## 2019-05-11 ENCOUNTER — Encounter: Payer: Self-pay | Admitting: Cardiovascular Disease

## 2019-05-11 ENCOUNTER — Other Ambulatory Visit: Payer: BC Managed Care – PPO

## 2019-05-11 VITALS — BP 146/76 | HR 96 | Ht 64.0 in | Wt 218.8 lb

## 2019-05-11 DIAGNOSIS — I482 Chronic atrial fibrillation, unspecified: Secondary | ICD-10-CM

## 2019-05-11 DIAGNOSIS — E669 Obesity, unspecified: Secondary | ICD-10-CM | POA: Diagnosis not present

## 2019-05-11 DIAGNOSIS — E782 Mixed hyperlipidemia: Secondary | ICD-10-CM

## 2019-05-11 DIAGNOSIS — I10 Essential (primary) hypertension: Secondary | ICD-10-CM

## 2019-05-11 DIAGNOSIS — Z720 Tobacco use: Secondary | ICD-10-CM

## 2019-05-11 DIAGNOSIS — I739 Peripheral vascular disease, unspecified: Secondary | ICD-10-CM

## 2019-05-11 DIAGNOSIS — I4891 Unspecified atrial fibrillation: Secondary | ICD-10-CM

## 2019-05-11 DIAGNOSIS — R0602 Shortness of breath: Secondary | ICD-10-CM

## 2019-05-11 DIAGNOSIS — I4819 Other persistent atrial fibrillation: Secondary | ICD-10-CM

## 2019-05-11 DIAGNOSIS — I5032 Chronic diastolic (congestive) heart failure: Secondary | ICD-10-CM

## 2019-05-11 LAB — BASIC METABOLIC PANEL
BUN/Creatinine Ratio: 13 (ref 12–28)
BUN: 32 mg/dL — ABNORMAL HIGH (ref 8–27)
CO2: 22 mmol/L (ref 20–29)
Calcium: 9.8 mg/dL (ref 8.7–10.3)
Chloride: 101 mmol/L (ref 96–106)
Creatinine, Ser: 2.48 mg/dL — ABNORMAL HIGH (ref 0.57–1.00)
GFR calc Af Amer: 22 mL/min/{1.73_m2} — ABNORMAL LOW (ref >59)
GFR calc non Af Amer: 19 mL/min/{1.73_m2} — ABNORMAL LOW (ref >59)
Glucose: 122 mg/dL — ABNORMAL HIGH (ref 65–99)
Potassium: 5.5 mmol/L — ABNORMAL HIGH (ref 3.5–5.2)
Sodium: 143 mmol/L (ref 134–144)

## 2019-05-11 LAB — LIPID PANEL
Chol/HDL Ratio: 3.8 ratio (ref 0.0–4.4)
Cholesterol, Total: 111 mg/dL (ref 100–199)
HDL: 29 mg/dL — ABNORMAL LOW (ref >39)
LDL Chol Calc (NIH): 45 mg/dL (ref 0–99)
Triglycerides: 232 mg/dL — ABNORMAL HIGH (ref 0–149)
VLDL Cholesterol Cal: 37 mg/dL (ref 5–40)

## 2019-05-11 LAB — HEPATIC FUNCTION PANEL
ALT: 5 IU/L (ref 0–32)
AST: 7 IU/L (ref 0–40)
Albumin: 4.2 g/dL (ref 3.8–4.8)
Alkaline Phosphatase: 115 IU/L (ref 39–117)
Bilirubin Total: 0.4 mg/dL (ref 0.0–1.2)
Bilirubin, Direct: 0.19 mg/dL (ref 0.00–0.40)
Total Protein: 6.3 g/dL (ref 6.0–8.5)

## 2019-05-11 NOTE — Patient Instructions (Signed)
Medication Instructions:  Your physician recommends that you continue on your current medications as directed. Please refer to the Current Medication list given to you today.  *If you need a refill on your cardiac medications before your next appointment, please call your pharmacy*  Lab Work: TODAY - cholesterol, liver panel, basic metabolic panel If you have labs (blood work) drawn today and your tests are completely normal, you will receive your results only by: Marland Kitchen MyChart Message (if you have MyChart) OR . A paper copy in the mail If you have any lab test that is abnormal or we need to change your treatment, we will call you to review the results.   Testing/Procedures: None Ordered   Follow-Up: At [facility], you and your health needs are our priority.  As part of our continuing mission to provide you with exceptional heart care, we have created designated Provider Care Teams.  These Care Teams include your primary Cardiologist (physician) and Advanced Practice Providers (APPs -  Physician Assistants and Nurse Practitioners) who all work together to provide you with the care you need, when you need it.  Your next appointment:   1 year(s)  The format for your next appointment:   In Person  Provider:   Richardson Dopp, PA-C, Robbie Lis, PA-C or Daune Perch, NP

## 2019-05-26 ENCOUNTER — Ambulatory Visit: Payer: BC Managed Care – PPO

## 2019-06-04 ENCOUNTER — Ambulatory Visit: Payer: BC Managed Care – PPO

## 2019-06-08 ENCOUNTER — Ambulatory Visit: Payer: BC Managed Care – PPO

## 2019-06-24 ENCOUNTER — Ambulatory Visit: Payer: BC Managed Care – PPO

## 2019-07-06 ENCOUNTER — Ambulatory Visit (INDEPENDENT_AMBULATORY_CARE_PROVIDER_SITE_OTHER): Payer: BC Managed Care – PPO | Admitting: Vascular Surgery

## 2019-07-06 ENCOUNTER — Encounter: Payer: Self-pay | Admitting: Vascular Surgery

## 2019-07-06 ENCOUNTER — Other Ambulatory Visit: Payer: Self-pay

## 2019-07-06 VITALS — BP 159/93 | HR 124 | Temp 97.3°F | Resp 16 | Ht 64.0 in | Wt 204.0 lb

## 2019-07-06 DIAGNOSIS — I739 Peripheral vascular disease, unspecified: Secondary | ICD-10-CM

## 2019-07-06 NOTE — Progress Notes (Signed)
Patient name: Paula Wheeler MRN: [id number] DOB: 18-Jan-1951 Sex: female  REASON FOR CONSULT: F/U BLE claudication  HPI: Paula Wheeler is a 69 y.o. female with history of stage IV CKD, COPD, atrial fibrillation on Xarelto, hypertension, hyperlipidemia, diabetes that presents for follow-up of her lower extremity claudication.  She was previously evaluated for aching and pain in the thighs and calves whenever she walked.  We ultimately recommended conservative management.  She was to see [place] for 37-month follow-up but that appointment never occurred due to Covid pandemic.  She is now working from home and states she has had very limited mobility.  She is in a lot of increased discomfort in the right leg.  She describes it as a toothache in the foot and calf.  She is also noted a wound on the top of the dorsum of her foot near the ankle that has been slow to heal.  She is wearing compression socks given some underlying venous insufficiency.  She still actively smoking and due to Covid and working from home she is smoking more.    Past Medical History:  Diagnosis Date  . Anemia   . COPD (chronic obstructive pulmonary disease) ([place])   . Dyslipidemia   . Exogenous obesity   . GERD (gastroesophageal reflux disease)   . GI bleed 05/14/2016  . History of abnormal cells from cervix   . Hyperlipidemia   . Hypertension   . Iron deficiency anemia   . Longstanding persistent atrial fibrillation ([place])   . Microalbuminuria   . Tobacco dependence   . Type II diabetes mellitus ([place])     Past Surgical History:  Procedure Laterality Date  . CARDIOVERSION N/A 08/07/2016   Procedure: CARDIOVERSION;  Surgeon: Thayer Headings, MD;  Location: [facility];  Service: Cardiovascular;  Laterality: N/A;  . DIAGNOSTIC MAMMOGRAM  05/2015  . ESOPHAGOGASTRODUODENOSCOPY N/A 05/15/2016   Procedure: ESOPHAGOGASTRODUODENOSCOPY (EGD);  Surgeon: Wonda Horner, MD;  Location: [facility] ENDOSCOPY;  Service: Endoscopy;   Laterality: N/A;  . [facility]   "I think they left my appendix"  . PAP SMEAR  2012 AND 2017    Family History  Problem Relation Age of Onset  . Cancer Mother        BREAST  . Hypertension Mother   . Macular degeneration Mother   . Heart attack Father   . Diabetes Father   . Colon polyps Brother   . Colon polyps Brother   . Colon polyps Brother   . Colon polyps Brother   . Colon polyps Brother   . Colon polyps Brother     SOCIAL HISTORY: Social History   Socioeconomic History  . Marital status: Single    Spouse name: Not on file  . Number of children: Not on file  . Years of education: Not on file  . Highest education level: Not on file  Occupational History  . Not on file  Tobacco Use  . Smoking status: Current Every Day Smoker    Packs/day: 0.50    Years: 40.00    Pack years: 20.00    Types: Cigarettes  . Smokeless tobacco: Never Used  Substance and Sexual Activity  . Alcohol use: Yes    Comment: 05/14/2016 "I'll have a drink once/year; New Year's"  . Drug use: No  . Sexual activity: Never  Other Topics Concern  . Not on file  Social History Narrative  . Not on file   Social Determinants of Health  Financial Resource Strain:   . Difficulty of Paying Living Expenses: Not on file  Food Insecurity:   . Worried About Charity fundraiser in the Last Year: Not on file  . Ran Out of Food in the Last Year: Not on file  Transportation Needs:   . Lack of Transportation (Medical): Not on file  . Lack of Transportation (Non-Medical): Not on file  Physical Activity:   . Days of Exercise per Week: Not on file  . Minutes of Exercise per Session: Not on file  Stress:   . Feeling of Stress : Not on file  Social Connections:   . Frequency of Communication with Friends and Family: Not on file  . Frequency of Social Gatherings with Friends and Family: Not on file  . Attends Religious Services: Not on file  . Active Member of Clubs or Organizations:  Not on file  . Attends Archivist Meetings: Not on file  . Marital Status: Not on file  Intimate Partner Violence:   . Fear of Current or Ex-Partner: Not on file  . Emotionally Abused: Not on file  . Physically Abused: Not on file  . Sexually Abused: Not on file    Allergies  Allergen Reactions  . Nsaids Swelling    Hands and legs swell  . Penicillins Hives    Has patient had a PCN reaction causing immediate rash, facial/tongue/throat swelling, SOB or lightheadedness with hypotension: Yes Has patient had a PCN reaction causing severe rash involving mucus membranes or skin necrosis: No Has patient had a PCN reaction that required hospitalization: No Has patient had a PCN reaction occurring within the last 10 years: No If all of the above answers are "NO", then may proceed with Cephalosporin use.     Current Outpatient Medications  Medication Sig Dispense Refill  . albuterol (PROVENTIL HFA;VENTOLIN HFA) 108 (90 Base) MCG/ACT inhaler Inhale 2 puffs into the lungs every 4 (four) hours as needed for wheezing or shortness of breath.    . allopurinol (ZYLOPRIM) 100 MG tablet Take 100 mg by mouth daily.    Marland Kitchen amLODipine (NORVASC) 10 MG tablet TAKE 1 TABLET(10 MG) BY MOUTH DAILY 90 tablet 1  . apixaban (ELIQUIS) 5 MG TABS tablet Take 1 tablet (5 mg total) by mouth 2 (two) times daily. 180 tablet 3  . atorvastatin (LIPITOR) 40 MG tablet Take 1 tablet (40 mg total) by mouth at bedtime. 90 tablet 3  . bisoprolol (ZEBETA) 5 MG tablet Take 1 tablet (5 mg total) by mouth daily. 30 tablet 11  . Cyanocobalamin (VITAMIN B-12 IJ) Inject 1 mL as directed every 30 (thirty) days.     Marland Kitchen escitalopram (LEXAPRO) 10 MG tablet Take 10 mg by mouth every evening.   12  . HYDROcodone-acetaminophen (NORCO/VICODIN) 5-325 MG tablet Take 1 tablet by mouth every 6 (six) hours as needed for moderate pain.    Marland Kitchen insulin NPH-regular Human (NOVOLIN 70/30) (70-30) 100 UNIT/ML injection Inject 35-60 Units into the  skin See admin instructions. Inject 35 units into the skin in the morning and 60 units in the evening    . Insulin Syringe-Needle U-100 (B-D INS SYR ULTRAFINE 1CC/31G) 31G X 5/16" 1 ML MISC by Does not apply route.    . loratadine (CLARITIN) 10 MG tablet Take 10 mg by mouth daily.    . metFORMIN (GLUCOPHAGE) 500 MG tablet Take 1,000-1,500 mg by mouth See admin instructions. Take 1,000 mg by mouth in the morning and 1,500 mg in the  evening    . pantoprazole (PROTONIX) 40 MG tablet Take 1 tablet (40 mg total) by mouth daily. 60 tablet 0  . SPIRIVA HANDIHALER 18 MCG inhalation capsule Place 18 mcg into inhaler and inhale daily.     Marland Kitchen torsemide (DEMADEX) 20 MG tablet TAKE 1 TABLET(20 MG) BY MOUTH DAILY 90 tablet 2  . TRIAMCINOLONE ACETONIDE EX Apply 1 application topically See admin instructions. Apply to both legs daily as directed    . triamterene-hydrochlorothiazide (MAXZIDE) 75-50 MG tablet Take 1 tablet by mouth daily.     No current facility-administered medications for this visit.    REVIEW OF SYSTEMS:  [X]  denotes positive finding, [ ]  denotes negative finding Cardiac  Comments:  Chest pain or chest pressure:    Shortness of breath upon exertion:    Short of breath when lying flat:    Irregular heart rhythm:        Vascular    Pain in calf, thigh, or hip brought on by ambulation: x Right>left  Pain in feet at night that wakes you up from your sleep:     Blood clot in your veins:    Leg swelling:         Pulmonary    Oxygen at home:    Productive cough:     Wheezing:         Neurologic    Sudden weakness in arms or legs:     Sudden numbness in arms or legs:     Sudden onset of difficulty speaking or slurred speech:    Temporary loss of vision in one eye:     Problems with dizziness:         Gastrointestinal    Blood in stool:     Vomited blood:         Genitourinary    Burning when urinating:     Blood in urine:        Psychiatric    Major depression:           Hematologic    Bleeding problems:    Problems with blood clotting too easily:        Skin    Rashes or ulcers:        Constitutional    Fever or chills:      PHYSICAL EXAM: Vitals:   07/06/19 1344  BP: (!) 159/93  Pulse: (!) 124  Resp: 16  Temp: (!) 97.3 F (36.3 C)  TempSrc: Temporal  SpO2: 99%  Weight: 204 lb (92.5 kg)  Height: 5\' 4"  (1.626 m)    GENERAL: The patient is a well-nourished female, in no acute distress. The vital signs are documented above. CARDIAC: There is a regular rate and rhythm.  VASCULAR:  2+ femoral pulse palpable bilateral groins. Monophasic dorsalis pedis and posterior tibial signals in the bilateral feet.   Wound on dorsum of right foot near ankle joint - more macerated skin PULMONARY: There is good air exchange bilaterally without wheezing or rales. ABDOMEN: Soft and non-tender with normal pitched bowel sounds.  MUSCULOSKELETAL: There are no major deformities or cyanosis. NEUROLOGIC: No focal weakness or paresthesias are detected. SKIN: There are no ulcers or rashes noted. PSYCHIATRIC: The patient has a normal affect.  DATA:   I independently reviewed ABIs which are 0.42 on the right monophasic and 0.5 on the left monophasic  Assessment/Plan:  69 year old female the presents for follow-up after previous evaluation for bilateral lower extremity claudication.  Unfortunately since we last saw her  she has had progression of symptoms in the right lower extremity and now has what sounds like rest pain.  Her ABI on the right is severely depressed 0.42 monophasic with very low amplitude waveforms in the toes. Given her symptom progression to rest pain I have recommended lower extremity arteriogram with possible intervention.  We discussed possible intervening at this time and/or needing a bypass at a later time if she has long segment occlusion.  Ultimately offered to schedule this today but she wants to further talk to her family about options before  scheduling.  I discussed the importance of smoking cessation this is a big risk factor for her disease progression.  She will call back to schedule aortogram lower extremity arteriogram with intervention.   Marty Heck, MD [facility] Office: [place]

## 2019-10-07 ENCOUNTER — Telehealth: Payer: Self-pay | Admitting: *Deleted

## 2019-10-07 NOTE — Telephone Encounter (Addendum)
Eliquis 5mg  refill request received. Patient is 69 years old, weight-92.5kg, Crea-2.48 on 05/11/2019, Diagnosis-Afib, and last seen by Dr. Acie Fredrickson on 05/11/2019. Dose is appropriate based on dosing criteria.   However, OV notes and the one on 06/10/2016, the pt was taking Eliquis 5mg  daily due to GI bleed because of erosive gastritis and she waas to resume full dose once gastritis resolves.05/11/2019, the OV note states continue Eliquis but no dose given. Pt should be on full dose but I need to clarify. Also, last Eliquis refill sent on 10/06/2018, was full dose, so need to clarify the dose the pt is on.   Spoke with pt and she confirmed that she has resumed the correct dose of Eliquis 5mg  BID and she is aware that I am sending in her refill at this time. She was appreciative of the call and aware I sent the refill while on the phone with her.

## 2019-10-08 ENCOUNTER — Other Ambulatory Visit: Payer: Self-pay | Admitting: Cardiovascular Disease

## 2019-10-08 ENCOUNTER — Other Ambulatory Visit: Payer: Self-pay

## 2019-10-08 MED ORDER — APIXABAN 5 MG PO TABS: 5.0000 mg | ORAL_TABLET | Freq: Two times a day (BID) | ORAL | 1 refills | Status: DC

## 2019-10-08 MED ORDER — BISOPROLOL FUMARATE 5 MG PO TABS: 5.0000 mg | ORAL_TABLET | Freq: Every day | ORAL | 5 refills | Status: DC

## 2019-10-08 MED ORDER — BISOPROLOL FUMARATE 5 MG PO TABS: 5.0000 mg | ORAL_TABLET | Freq: Every day | ORAL | 1 refills | Status: DC

## 2019-10-08 NOTE — Telephone Encounter (Signed)
*  STAT* If patient is at the pharmacy, call can be transferred to refill team.   1. Which medications need to be refilled? (please list name of each medication and dose if known)  apixaban (ELIQUIS) 5 MG TABS tablet bisoprolol (ZEBETA) 5 MG tablet  2. Which pharmacy/location (including street and city if local pharmacy) is medication to be sent to? [facility] [place], [place] AT [address]  3. Do they need a 30 day or 90 day supply? 90 day

## 2019-10-28 ENCOUNTER — Other Ambulatory Visit: Payer: Self-pay

## 2019-10-28 DIAGNOSIS — I4891 Unspecified atrial fibrillation: Secondary | ICD-10-CM

## 2019-10-28 DIAGNOSIS — I739 Peripheral vascular disease, unspecified: Secondary | ICD-10-CM

## 2019-10-28 MED ORDER — ATORVASTATIN CALCIUM 40 MG PO TABS: 40.0000 mg | ORAL_TABLET | Freq: Every day | ORAL | 1 refills | Status: DC

## 2019-10-28 MED ORDER — AMLODIPINE BESYLATE 10 MG PO TABS: ORAL_TABLET | ORAL | 1 refills | Status: DC

## 2019-11-23 ENCOUNTER — Inpatient Hospital Stay (HOSPITAL_COMMUNITY): Payer: BC Managed Care – PPO

## 2019-11-23 ENCOUNTER — Emergency Department (HOSPITAL_COMMUNITY): Payer: BC Managed Care – PPO

## 2019-11-23 ENCOUNTER — Other Ambulatory Visit: Payer: Self-pay

## 2019-11-23 ENCOUNTER — Inpatient Hospital Stay (HOSPITAL_COMMUNITY)
Admission: EM | Admit: 2019-11-23 | Discharge: 2019-11-29 | DRG: 871 | Disposition: A | Discharge status: Home Health | Payer: BC Managed Care – PPO | Attending: Internal Medicine | Admitting: Internal Medicine

## 2019-11-23 DIAGNOSIS — E872 Acidosis, unspecified: Secondary | ICD-10-CM

## 2019-11-23 DIAGNOSIS — I5032 Chronic diastolic (congestive) heart failure: Secondary | ICD-10-CM | POA: Diagnosis present

## 2019-11-23 DIAGNOSIS — N179 Acute kidney failure, unspecified: Secondary | ICD-10-CM | POA: Diagnosis present

## 2019-11-23 DIAGNOSIS — R6521 Severe sepsis with septic shock: Secondary | ICD-10-CM | POA: Diagnosis present

## 2019-11-23 DIAGNOSIS — A419 Sepsis, unspecified organism: Secondary | ICD-10-CM | POA: Diagnosis present

## 2019-11-23 DIAGNOSIS — Z20822 Contact with and (suspected) exposure to covid-19: Secondary | ICD-10-CM | POA: Diagnosis present

## 2019-11-23 DIAGNOSIS — R579 Shock, unspecified: Secondary | ICD-10-CM | POA: Diagnosis not present

## 2019-11-23 DIAGNOSIS — I998 Other disorder of circulatory system: Secondary | ICD-10-CM

## 2019-11-23 DIAGNOSIS — Z880 Allergy status to penicillin: Secondary | ICD-10-CM | POA: Diagnosis not present

## 2019-11-23 DIAGNOSIS — E1151 Type 2 diabetes mellitus with diabetic peripheral angiopathy without gangrene: Secondary | ICD-10-CM | POA: Diagnosis present

## 2019-11-23 DIAGNOSIS — Z888 Allergy status to other drugs, medicaments and biological substances status: Secondary | ICD-10-CM

## 2019-11-23 DIAGNOSIS — E875 Hyperkalemia: Secondary | ICD-10-CM | POA: Insufficient documentation

## 2019-11-23 DIAGNOSIS — Z794 Long term (current) use of insulin: Secondary | ICD-10-CM

## 2019-11-23 DIAGNOSIS — I4811 Longstanding persistent atrial fibrillation: Secondary | ICD-10-CM | POA: Diagnosis present

## 2019-11-23 DIAGNOSIS — I517 Cardiomegaly: Secondary | ICD-10-CM

## 2019-11-23 DIAGNOSIS — J9601 Acute respiratory failure with hypoxia: Secondary | ICD-10-CM | POA: Diagnosis present

## 2019-11-23 DIAGNOSIS — T383X5A Adverse effect of insulin and oral hypoglycemic [antidiabetic] drugs, initial encounter: Secondary | ICD-10-CM | POA: Diagnosis present

## 2019-11-23 DIAGNOSIS — E871 Hypo-osmolality and hyponatremia: Secondary | ICD-10-CM | POA: Diagnosis present

## 2019-11-23 DIAGNOSIS — N184 Chronic kidney disease, stage 4 (severe): Secondary | ICD-10-CM | POA: Diagnosis present

## 2019-11-23 DIAGNOSIS — J440 Chronic obstructive pulmonary disease with acute lower respiratory infection: Secondary | ICD-10-CM | POA: Diagnosis present

## 2019-11-23 DIAGNOSIS — J189 Pneumonia, unspecified organism: Secondary | ICD-10-CM | POA: Diagnosis present

## 2019-11-23 DIAGNOSIS — I701 Atherosclerosis of renal artery: Secondary | ICD-10-CM | POA: Diagnosis present

## 2019-11-23 DIAGNOSIS — Z79899 Other long term (current) drug therapy: Secondary | ICD-10-CM

## 2019-11-23 DIAGNOSIS — Z8371 Family history of colonic polyps: Secondary | ICD-10-CM

## 2019-11-23 DIAGNOSIS — L89892 Pressure ulcer of other site, stage 2: Secondary | ICD-10-CM | POA: Diagnosis present

## 2019-11-23 DIAGNOSIS — E1122 Type 2 diabetes mellitus with diabetic chronic kidney disease: Secondary | ICD-10-CM | POA: Diagnosis present

## 2019-11-23 DIAGNOSIS — I739 Peripheral vascular disease, unspecified: Secondary | ICD-10-CM | POA: Diagnosis not present

## 2019-11-23 DIAGNOSIS — Z809 Family history of malignant neoplasm, unspecified: Secondary | ICD-10-CM

## 2019-11-23 DIAGNOSIS — F17210 Nicotine dependence, cigarettes, uncomplicated: Secondary | ICD-10-CM | POA: Diagnosis present

## 2019-11-23 DIAGNOSIS — E6609 Other obesity due to excess calories: Secondary | ICD-10-CM | POA: Diagnosis present

## 2019-11-23 DIAGNOSIS — Z833 Family history of diabetes mellitus: Secondary | ICD-10-CM

## 2019-11-23 DIAGNOSIS — X58XXXA Exposure to other specified factors, initial encounter: Secondary | ICD-10-CM | POA: Diagnosis present

## 2019-11-23 DIAGNOSIS — L97829 Non-pressure chronic ulcer of other part of left lower leg with unspecified severity: Secondary | ICD-10-CM | POA: Diagnosis present

## 2019-11-23 DIAGNOSIS — E1110 Type 2 diabetes mellitus with ketoacidosis without coma: Secondary | ICD-10-CM | POA: Diagnosis present

## 2019-11-23 DIAGNOSIS — E785 Hyperlipidemia, unspecified: Secondary | ICD-10-CM | POA: Diagnosis present

## 2019-11-23 DIAGNOSIS — E876 Hypokalemia: Secondary | ICD-10-CM | POA: Diagnosis present

## 2019-11-23 DIAGNOSIS — Z6838 Body mass index (BMI) 38.0-38.9, adult: Secondary | ICD-10-CM

## 2019-11-23 DIAGNOSIS — D631 Anemia in chronic kidney disease: Secondary | ICD-10-CM | POA: Diagnosis present

## 2019-11-23 DIAGNOSIS — E860 Dehydration: Secondary | ICD-10-CM | POA: Diagnosis present

## 2019-11-23 DIAGNOSIS — Z8249 Family history of ischemic heart disease and other diseases of the circulatory system: Secondary | ICD-10-CM

## 2019-11-23 DIAGNOSIS — I130 Hypertensive heart and chronic kidney disease with heart failure and stage 1 through stage 4 chronic kidney disease, or unspecified chronic kidney disease: Secondary | ICD-10-CM | POA: Diagnosis present

## 2019-11-23 DIAGNOSIS — Z7901 Long term (current) use of anticoagulants: Secondary | ICD-10-CM

## 2019-11-23 DIAGNOSIS — G8929 Other chronic pain: Secondary | ICD-10-CM | POA: Diagnosis present

## 2019-11-23 DIAGNOSIS — K219 Gastro-esophageal reflux disease without esophagitis: Secondary | ICD-10-CM | POA: Diagnosis present

## 2019-11-23 DIAGNOSIS — G9340 Encephalopathy, unspecified: Secondary | ICD-10-CM | POA: Insufficient documentation

## 2019-11-23 DIAGNOSIS — L8990 Pressure ulcer of unspecified site, unspecified stage: Secondary | ICD-10-CM | POA: Insufficient documentation

## 2019-11-23 LAB — URINALYSIS, ROUTINE W REFLEX MICROSCOPIC
Bilirubin Urine: NEGATIVE
Glucose, UA: NEGATIVE mg/dL
Ketones, ur: NEGATIVE mg/dL
Leukocytes,Ua: NEGATIVE
Nitrite: NEGATIVE
Protein, ur: 30 mg/dL — AB
Specific Gravity, Urine: 1.016 (ref 1.005–1.030)
pH: 5 (ref 5.0–8.0)

## 2019-11-23 LAB — CBC
HCT: 32.4 % — ABNORMAL LOW (ref 36.0–46.0)
Hemoglobin: 9.4 g/dL — ABNORMAL LOW (ref 12.0–15.0)
MCH: 31.2 pg (ref 26.0–34.0)
MCHC: 29 g/dL — ABNORMAL LOW (ref 30.0–36.0)
MCV: 107.6 fL — ABNORMAL HIGH (ref 80.0–100.0)
Platelets: 258 10*3/uL (ref 150–400)
RBC: 3.01 MIL/uL — ABNORMAL LOW (ref 3.87–5.11)
RDW: 14.4 % (ref 11.5–15.5)
WBC: 23.3 10*3/uL — ABNORMAL HIGH (ref 4.0–10.5)
nRBC: 0.2 % (ref 0.0–0.2)

## 2019-11-23 LAB — CBC WITH DIFFERENTIAL/PLATELET
Abs Immature Granulocytes: 0.57 10*3/uL — ABNORMAL HIGH (ref 0.00–0.07)
Basophils Absolute: 0.1 10*3/uL (ref 0.0–0.1)
Basophils Relative: 0 %
Eosinophils Absolute: 0 10*3/uL (ref 0.0–0.5)
Eosinophils Relative: 0 %
HCT: 36 % (ref 36.0–46.0)
Hemoglobin: 10.3 g/dL — ABNORMAL LOW (ref 12.0–15.0)
Immature Granulocytes: 3 %
Lymphocytes Relative: 10 %
Lymphs Abs: 2 10*3/uL (ref 0.7–4.0)
MCH: 32 pg (ref 26.0–34.0)
MCHC: 28.6 g/dL — ABNORMAL LOW (ref 30.0–36.0)
MCV: 111.8 fL — ABNORMAL HIGH (ref 80.0–100.0)
Monocytes Absolute: 0.7 10*3/uL (ref 0.1–1.0)
Monocytes Relative: 3 %
Neutro Abs: 16.8 10*3/uL — ABNORMAL HIGH (ref 1.7–7.7)
Neutrophils Relative %: 84 %
Platelets: 302 10*3/uL (ref 150–400)
RBC: 3.22 MIL/uL — ABNORMAL LOW (ref 3.87–5.11)
RDW: 14.3 % (ref 11.5–15.5)
WBC: 20.1 10*3/uL — ABNORMAL HIGH (ref 4.0–10.5)
nRBC: 0 % (ref 0.0–0.2)

## 2019-11-23 LAB — I-STAT CHEM 8, ED
BUN: 76 mg/dL — ABNORMAL HIGH (ref 8–23)
Calcium, Ion: 0.89 mmol/L — CL (ref 1.15–1.40)
Chloride: 99 mmol/L (ref 98–111)
Creatinine, Ser: 10.8 mg/dL — ABNORMAL HIGH (ref 0.44–1.00)
Glucose, Bld: 355 mg/dL — ABNORMAL HIGH (ref 70–99)
HCT: 36 % (ref 36.0–46.0)
Hemoglobin: 12.2 g/dL (ref 12.0–15.0)
Potassium: 5.4 mmol/L — ABNORMAL HIGH (ref 3.5–5.1)
Sodium: 131 mmol/L — ABNORMAL LOW (ref 135–145)
TCO2: 5 mmol/L — ABNORMAL LOW (ref 22–32)

## 2019-11-23 LAB — RENAL FUNCTION PANEL
Albumin: 3.1 g/dL — ABNORMAL LOW (ref 3.5–5.0)
Anion gap: 24 — ABNORMAL HIGH (ref 5–15)
BUN: 54 mg/dL — ABNORMAL HIGH (ref 8–23)
CO2: 13 mmol/L — ABNORMAL LOW (ref 22–32)
Calcium: 7.3 mg/dL — ABNORMAL LOW (ref 8.9–10.3)
Chloride: 97 mmol/L — ABNORMAL LOW (ref 98–111)
Creatinine, Ser: 6.37 mg/dL — ABNORMAL HIGH (ref 0.44–1.00)
GFR calc Af Amer: 7 mL/min — ABNORMAL LOW (ref >60)
GFR calc non Af Amer: 6 mL/min — ABNORMAL LOW (ref >60)
Glucose, Bld: 131 mg/dL — ABNORMAL HIGH (ref 70–99)
Phosphorus: 6.5 mg/dL — ABNORMAL HIGH (ref 2.5–4.6)
Potassium: 4.4 mmol/L (ref 3.5–5.1)
Sodium: 134 mmol/L — ABNORMAL LOW (ref 135–145)

## 2019-11-23 LAB — COMPREHENSIVE METABOLIC PANEL
ALT: 7 U/L (ref 0–44)
AST: 17 U/L (ref 15–41)
Albumin: 3.2 g/dL — ABNORMAL LOW (ref 3.5–5.0)
Alkaline Phosphatase: 85 U/L (ref 38–126)
BUN: 75 mg/dL — ABNORMAL HIGH (ref 8–23)
CO2: <7 mmol/L — ABNORMAL LOW (ref 22–32)
Calcium: 7.2 mg/dL — ABNORMAL LOW (ref 8.9–10.3)
Chloride: 93 mmol/L — ABNORMAL LOW (ref 98–111)
Creatinine, Ser: 10.12 mg/dL — ABNORMAL HIGH (ref 0.44–1.00)
GFR calc Af Amer: 4 mL/min — ABNORMAL LOW (ref >60)
GFR calc non Af Amer: 3 mL/min — ABNORMAL LOW (ref >60)
Glucose, Bld: 381 mg/dL — ABNORMAL HIGH (ref 70–99)
Potassium: 5.7 mmol/L — ABNORMAL HIGH (ref 3.5–5.1)
Sodium: 133 mmol/L — ABNORMAL LOW (ref 135–145)
Total Bilirubin: 0.8 mg/dL (ref 0.3–1.2)
Total Protein: 6.1 g/dL — ABNORMAL LOW (ref 6.5–8.1)

## 2019-11-23 LAB — ECHOCARDIOGRAM COMPLETE
Height: 65 in
S' Lateral: 4.5 cm
Weight: 3680 oz

## 2019-11-23 LAB — GLUCOSE, CAPILLARY
Glucose-Capillary: 250 mg/dL — ABNORMAL HIGH (ref 70–99)
Glucose-Capillary: 220 mg/dL — ABNORMAL HIGH (ref 70–99)
Glucose-Capillary: 162 mg/dL — ABNORMAL HIGH (ref 70–99)
Glucose-Capillary: 169 mg/dL — ABNORMAL HIGH (ref 70–99)
Glucose-Capillary: 132 mg/dL — ABNORMAL HIGH (ref 70–99)
Glucose-Capillary: 129 mg/dL — ABNORMAL HIGH (ref 70–99)
Glucose-Capillary: 111 mg/dL — ABNORMAL HIGH (ref 70–99)
Glucose-Capillary: 139 mg/dL — ABNORMAL HIGH (ref 70–99)
Glucose-Capillary: 126 mg/dL — ABNORMAL HIGH (ref 70–99)
Glucose-Capillary: 127 mg/dL — ABNORMAL HIGH (ref 70–99)
Glucose-Capillary: 131 mg/dL — ABNORMAL HIGH (ref 70–99)
Glucose-Capillary: 130 mg/dL — ABNORMAL HIGH (ref 70–99)
Glucose-Capillary: 124 mg/dL — ABNORMAL HIGH (ref 70–99)
Glucose-Capillary: 124 mg/dL — ABNORMAL HIGH (ref 70–99)
Glucose-Capillary: 131 mg/dL — ABNORMAL HIGH (ref 70–99)
Glucose-Capillary: 115 mg/dL — ABNORMAL HIGH (ref 70–99)

## 2019-11-23 LAB — I-STAT VENOUS BLOOD GAS, ED
Acid-base deficit: 30 mmol/L — ABNORMAL HIGH (ref 0.0–2.0)
Bicarbonate: 3 mmol/L — ABNORMAL LOW (ref 20.0–28.0)
Calcium, Ion: 0.85 mmol/L — CL (ref 1.15–1.40)
HCT: 35 % — ABNORMAL LOW (ref 36.0–46.0)
Hemoglobin: 11.9 g/dL — ABNORMAL LOW (ref 12.0–15.0)
O2 Saturation: 91 %
Potassium: 5.4 mmol/L — ABNORMAL HIGH (ref 3.5–5.1)
Sodium: 131 mmol/L — ABNORMAL LOW (ref 135–145)
TCO2: <5 mmol/L — ABNORMAL LOW (ref 22–32)
pCO2, Ven: 21.5 mmHg — ABNORMAL LOW (ref 44.0–60.0)
pH, Ven: 6.757 — CL (ref 7.250–7.430)
pO2, Ven: 115 mmHg — ABNORMAL HIGH (ref 32.0–45.0)

## 2019-11-23 LAB — MRSA PCR SCREENING: MRSA by PCR: NEGATIVE

## 2019-11-23 LAB — HEMOGLOBIN A1C
Hgb A1c MFr Bld: 7.1 % — ABNORMAL HIGH (ref 4.8–5.6)
Mean Plasma Glucose: 157.07 mg/dL

## 2019-11-23 LAB — BETA-HYDROXYBUTYRIC ACID
Beta-Hydroxybutyric Acid: 4.66 mmol/L — ABNORMAL HIGH (ref 0.05–0.27)
Beta-Hydroxybutyric Acid: 2.15 mmol/L — ABNORMAL HIGH (ref 0.05–0.27)

## 2019-11-23 LAB — LACTIC ACID, PLASMA
Lactic Acid, Venous: >11 mmol/L (ref 0.5–1.9)
Lactic Acid, Venous: >11 mmol/L (ref 0.5–1.9)
Lactic Acid, Venous: 10.5 mmol/L (ref 0.5–1.9)

## 2019-11-23 LAB — APTT
aPTT: 43 seconds — ABNORMAL HIGH (ref 24–36)
aPTT: 50 seconds — ABNORMAL HIGH (ref 24–36)
aPTT: 86 seconds — ABNORMAL HIGH (ref 24–36)

## 2019-11-23 LAB — BASIC METABOLIC PANEL
Anion gap: 32 — ABNORMAL HIGH (ref 5–15)
Anion gap: 21 — ABNORMAL HIGH (ref 5–15)
BUN: 74 mg/dL — ABNORMAL HIGH (ref 8–23)
BUN: 74 mg/dL — ABNORMAL HIGH (ref 8–23)
BUN: 51 mg/dL — ABNORMAL HIGH (ref 8–23)
CO2: <7 mmol/L — ABNORMAL LOW (ref 22–32)
CO2: 8 mmol/L — ABNORMAL LOW (ref 22–32)
CO2: 14 mmol/L — ABNORMAL LOW (ref 22–32)
Calcium: 7 mg/dL — ABNORMAL LOW (ref 8.9–10.3)
Calcium: 7.3 mg/dL — ABNORMAL LOW (ref 8.9–10.3)
Calcium: 7.3 mg/dL — ABNORMAL LOW (ref 8.9–10.3)
Chloride: 95 mmol/L — ABNORMAL LOW (ref 98–111)
Chloride: 96 mmol/L — ABNORMAL LOW (ref 98–111)
Chloride: 98 mmol/L (ref 98–111)
Creatinine, Ser: 9.68 mg/dL — ABNORMAL HIGH (ref 0.44–1.00)
Creatinine, Ser: 9.27 mg/dL — ABNORMAL HIGH (ref 0.44–1.00)
Creatinine, Ser: 5.9 mg/dL — ABNORMAL HIGH (ref 0.44–1.00)
GFR calc Af Amer: 4 mL/min — ABNORMAL LOW (ref >60)
GFR calc Af Amer: 5 mL/min — ABNORMAL LOW (ref >60)
GFR calc Af Amer: 8 mL/min — ABNORMAL LOW (ref >60)
GFR calc non Af Amer: 4 mL/min — ABNORMAL LOW (ref >60)
GFR calc non Af Amer: 4 mL/min — ABNORMAL LOW (ref >60)
GFR calc non Af Amer: 7 mL/min — ABNORMAL LOW (ref >60)
Glucose, Bld: 289 mg/dL — ABNORMAL HIGH (ref 70–99)
Glucose, Bld: 171 mg/dL — ABNORMAL HIGH (ref 70–99)
Glucose, Bld: 132 mg/dL — ABNORMAL HIGH (ref 70–99)
Potassium: 4.4 mmol/L (ref 3.5–5.1)
Potassium: 4.6 mmol/L (ref 3.5–5.1)
Potassium: 4.3 mmol/L (ref 3.5–5.1)
Sodium: 135 mmol/L (ref 135–145)
Sodium: 136 mmol/L (ref 135–145)
Sodium: 133 mmol/L — ABNORMAL LOW (ref 135–145)

## 2019-11-23 LAB — CK: Total CK: 850 U/L — ABNORMAL HIGH (ref 38–234)

## 2019-11-23 LAB — MAGNESIUM: Magnesium: 2.1 mg/dL (ref 1.7–2.4)

## 2019-11-23 LAB — SARS CORONAVIRUS 2 BY RT PCR (HOSPITAL ORDER, PERFORMED IN ~~LOC~~ HOSPITAL LAB): SARS Coronavirus 2: NEGATIVE

## 2019-11-23 LAB — CBG MONITORING, ED
Glucose-Capillary: 353 mg/dL — ABNORMAL HIGH (ref 70–99)
Glucose-Capillary: 291 mg/dL — ABNORMAL HIGH (ref 70–99)
Glucose-Capillary: 173 mg/dL — ABNORMAL HIGH (ref 70–99)

## 2019-11-23 LAB — HIV ANTIBODY (ROUTINE TESTING W REFLEX): HIV Screen 4th Generation wRfx: NONREACTIVE

## 2019-11-23 LAB — PHOSPHORUS
Phosphorus: >30 mg/dL — ABNORMAL HIGH (ref 2.5–4.6)
Phosphorus: 10.3 mg/dL — ABNORMAL HIGH (ref 2.5–4.6)

## 2019-11-23 LAB — TROPONIN I (HIGH SENSITIVITY): Troponin I (High Sensitivity): 57 ng/L — ABNORMAL HIGH (ref <18)

## 2019-11-23 LAB — PROTIME-INR
INR: 2.2 — ABNORMAL HIGH (ref 0.8–1.2)
Prothrombin Time: 23.9 seconds — ABNORMAL HIGH (ref 11.4–15.2)

## 2019-11-23 LAB — HEPARIN LEVEL (UNFRACTIONATED): Heparin Unfractionated: 1.28 IU/mL — ABNORMAL HIGH (ref 0.30–0.70)

## 2019-11-23 MED ORDER — PRISMASOL BGK 4/2.5 32-4-2.5 MEQ/L REPLACEMENT SOLN
Status: DC
  Filled 2019-11-23: qty 5000

## 2019-11-23 MED ORDER — DEXTROSE-NACL 5-0.45 % IV SOLN
INTRAVENOUS | Status: DC
  Administered 2019-11-24: 50 mL/h via INTRAVENOUS
  Administered 2019-11-24: 75 mL/h via INTRAVENOUS

## 2019-11-23 MED ORDER — SODIUM CHLORIDE 0.9 % IV BOLUS
1000.0000 mL | Freq: Once | INTRAVENOUS | Status: AC
  Administered 2019-11-23: 1000 mL via INTRAVENOUS

## 2019-11-23 MED ORDER — CALCIUM GLUCONATE-NACL 1-0.675 GM/50ML-% IV SOLN
1.0000 g | Freq: Once | INTRAVENOUS | Status: AC
  Administered 2019-11-23: 1000 mg via INTRAVENOUS
  Filled 2019-11-23: qty 50

## 2019-11-23 MED ORDER — SODIUM CHLORIDE 0.9 % IV SOLN
1.0000 g | Freq: Once | INTRAVENOUS | Status: DC
  Filled 2019-11-23: qty 1

## 2019-11-23 MED ORDER — DEXTROSE 50 % IV SOLN: 0.0000 mL | INTRAVENOUS | Status: DC | PRN

## 2019-11-23 MED ORDER — SODIUM CHLORIDE 0.9 % IV SOLN: INTRAVENOUS | Status: DC

## 2019-11-23 MED ORDER — DOCUSATE SODIUM 100 MG PO CAPS: 100.0000 mg | ORAL_CAPSULE | Freq: Two times a day (BID) | ORAL | Status: DC | PRN

## 2019-11-23 MED ORDER — CHLORHEXIDINE GLUCONATE CLOTH 2 % EX PADS
6.0000 | MEDICATED_PAD | Freq: Every day | CUTANEOUS | Status: DC
  Administered 2019-11-23 – 2019-11-29 (×6): 6 via TOPICAL

## 2019-11-23 MED ORDER — ALBUTEROL SULFATE (2.5 MG/3ML) 0.083% IN NEBU: 2.5000 mg | INHALATION_SOLUTION | RESPIRATORY_TRACT | Status: DC | PRN

## 2019-11-23 MED ORDER — IOHEXOL 350 MG/ML SOLN
100.0000 mL | Freq: Once | INTRAVENOUS | Status: AC | PRN
  Administered 2019-11-23: 100 mL via INTRAVENOUS

## 2019-11-23 MED ORDER — SODIUM PHOSPHATES 45 MMOLE/15ML IV SOLN
30.0000 mmol | Freq: Once | INTRAVENOUS | Status: DC
  Filled 2019-11-23: qty 10

## 2019-11-23 MED ORDER — POLYETHYLENE GLYCOL 3350 17 G PO PACK: 17.0000 g | PACK | Freq: Every day | ORAL | Status: DC | PRN

## 2019-11-23 MED ORDER — IPRATROPIUM-ALBUTEROL 0.5-2.5 (3) MG/3ML IN SOLN
3.0000 mL | Freq: Four times a day (QID) | RESPIRATORY_TRACT | Status: DC
  Administered 2019-11-23 (×4): 3 mL via RESPIRATORY_TRACT
  Filled 2019-11-23 (×3): qty 3

## 2019-11-23 MED ORDER — HEPARIN SODIUM (PORCINE) 5000 UNIT/ML IJ SOLN: 5000.0000 [IU] | Freq: Three times a day (TID) | INTRAMUSCULAR | Status: DC

## 2019-11-23 MED ORDER — STERILE WATER FOR INJECTION IV SOLN
INTRAVENOUS | Status: DC
  Filled 2019-11-23 (×2): qty 850

## 2019-11-23 MED ORDER — VANCOMYCIN HCL IN DEXTROSE 1-5 GM/200ML-% IV SOLN
1000.0000 mg | INTRAVENOUS | Status: DC
  Administered 2019-11-24 – 2019-11-25 (×2): 1000 mg via INTRAVENOUS
  Filled 2019-11-23 (×2): qty 200

## 2019-11-23 MED ORDER — SODIUM CHLORIDE 0.9 % IV SOLN
2.0000 g | Freq: Once | INTRAVENOUS | Status: AC
  Administered 2019-11-23: 2 g via INTRAVENOUS
  Filled 2019-11-23: qty 2

## 2019-11-23 MED ORDER — PRISMASOL BGK 4/2.5 32-4-2.5 MEQ/L REPLACEMENT SOLN
Status: DC
  Filled 2019-11-23 (×2): qty 5000

## 2019-11-23 MED ORDER — INSULIN REGULAR(HUMAN) IN NACL 100-0.9 UT/100ML-% IV SOLN
INTRAVENOUS | Status: DC
  Administered 2019-11-23: 11.5 [IU]/h via INTRAVENOUS
  Administered 2019-11-23: 3.2 [IU]/h via INTRAVENOUS
  Filled 2019-11-23: qty 100

## 2019-11-23 MED ORDER — VANCOMYCIN HCL 2000 MG/400ML IV SOLN
2000.0000 mg | Freq: Once | INTRAVENOUS | Status: AC
  Administered 2019-11-23: 2000 mg via INTRAVENOUS
  Filled 2019-11-23: qty 400

## 2019-11-23 MED ORDER — DEXTROSE-NACL 5-0.45 % IV SOLN
INTRAVENOUS | Status: DC
  Administered 2019-11-23: 100 mL/h via INTRAVENOUS

## 2019-11-23 MED ORDER — SODIUM BICARBONATE 8.4 % IV SOLN
50.0000 meq | Freq: Once | INTRAVENOUS | Status: AC
  Administered 2019-11-23: 50 meq via INTRAVENOUS
  Filled 2019-11-23: qty 50

## 2019-11-23 MED ORDER — IPRATROPIUM-ALBUTEROL 0.5-2.5 (3) MG/3ML IN SOLN
RESPIRATORY_TRACT | Status: AC
  Filled 2019-11-23: qty 3

## 2019-11-23 MED ORDER — SODIUM CHLORIDE 0.9 % IV SOLN
INTRAVENOUS | Status: DC | PRN
  Administered 2019-11-23: 250 mL via INTRAVENOUS

## 2019-11-23 MED ORDER — SODIUM CHLORIDE 0.9 % IV SOLN
1.0000 g | INTRAVENOUS | Status: DC
  Filled 2019-11-23: qty 1

## 2019-11-23 MED ORDER — SODIUM CHLORIDE 0.9 % IV SOLN
2.0000 g | Freq: Two times a day (BID) | INTRAVENOUS | Status: DC
  Administered 2019-11-23 – 2019-11-26 (×6): 2 g via INTRAVENOUS
  Filled 2019-11-23 (×7): qty 2

## 2019-11-23 MED ORDER — NOREPINEPHRINE 4 MG/250ML-% IV SOLN
0.0000 ug/min | INTRAVENOUS | Status: DC
  Administered 2019-11-23: 6 ug/min via INTRAVENOUS
  Administered 2019-11-23: 2 ug/min via INTRAVENOUS
  Filled 2019-11-23: qty 250

## 2019-11-23 MED ORDER — VANCOMYCIN VARIABLE DOSE PER UNSTABLE RENAL FUNCTION (PHARMACIST DOSING): Status: DC

## 2019-11-23 MED ORDER — LACTATED RINGERS IV BOLUS
1000.0000 mL | Freq: Once | INTRAVENOUS | Status: AC
  Administered 2019-11-23: 1000 mL via INTRAVENOUS

## 2019-11-23 MED ORDER — HEPARIN SODIUM (PORCINE) 1000 UNIT/ML DIALYSIS
1000.0000 [IU] | INTRAMUSCULAR | Status: DC | PRN
  Administered 2019-11-25: 4000 [IU] via INTRAVENOUS_CENTRAL
  Filled 2019-11-23 (×2): qty 6
  Filled 2019-11-23: qty 4

## 2019-11-23 MED ORDER — VANCOMYCIN HCL IN DEXTROSE 1-5 GM/200ML-% IV SOLN: 1000.0000 mg | Freq: Once | INTRAVENOUS | Status: DC

## 2019-11-23 MED ORDER — HEPARIN (PORCINE) 25000 UT/250ML-% IV SOLN
1050.0000 [IU]/h | INTRAVENOUS | Status: DC
  Administered 2019-11-23: 1200 [IU]/h via INTRAVENOUS
  Filled 2019-11-23 (×2): qty 250

## 2019-11-23 MED ORDER — PRISMASOL BGK 4/2.5 32-4-2.5 MEQ/L IV SOLN
INTRAVENOUS | Status: DC
  Administered 2019-11-24: 2000 mL/h via INTRAVENOUS_CENTRAL
  Filled 2019-11-23 (×7): qty 5000

## 2019-11-23 MED ORDER — INSULIN REGULAR(HUMAN) IN NACL 100-0.9 UT/100ML-% IV SOLN
INTRAVENOUS | Status: DC
  Administered 2019-11-25: 0.4 [IU]/h via INTRAVENOUS
  Filled 2019-11-23: qty 100

## 2019-11-23 NOTE — Progress Notes (Signed)
69 year old diabetic with PAD, CKD stage IV, baseline creatinine 2.5 admitted with generalized weakness, lactic acidosis and AKI Treated for DKA due to beta hydroxybutyrate 4.4 and anion gap acidosis CT angiogram imaging negative for bowel ischemia or limb ischemia. Vascular consult appreciated  Started on CRRT, which she seems to tolerate well, weaned off Levophed. Needing low-dose insulin 1 unit/h, dextrose added to IV fluids, will recheck beta hydroxybutyrate and if resolved, can DC insulin drip Continue antibiotics for sepsis although no source apparent while awaiting cultures  Presumptive diagnosis here is Metformin induced lactic acidosis and AKI  Heparin IV for atrial fibrillation instead of Eliquis.  Brother updated at bedside  Additional critical care time x 39m  [name] V. Elsworth Soho MD

## 2019-11-23 NOTE — Progress Notes (Signed)
[place] for heparin Indication: atrial fibrillation  Allergies  Allergen Reactions  . Nsaids Swelling    Hands and legs swell  . Penicillins Hives    Has patient had a PCN reaction causing immediate rash, facial/tongue/throat swelling, SOB or lightheadedness with hypotension: Yes Has patient had a PCN reaction causing severe rash involving mucus membranes or skin necrosis: No Has patient had a PCN reaction that required hospitalization: No Has patient had a PCN reaction occurring within the last 10 years: No If all of the above answers are "NO", then may proceed with Cephalosporin use.     Patient Measurements: Height: 5\' 5"  (165.1 cm) Weight: (!) 99 kg (218 lb 4.1 oz) IBW/kg (Calculated) : 57 Heparin Dosing Weight: 81.2 kg  Vital Signs: Temp: 97.8 F (36.6 C) (07/27 1500) Temp Source: Axillary (07/27 1000) BP: 127/74 (07/27 1900) Pulse Rate: 124 (07/27 1900)  Labs: Recent Labs    11/23/19 0147 11/23/19 0147 11/23/19 0203 11/23/19 0203 11/23/19 0218 11/23/19 0413 11/23/19 0444 11/23/19 0444 11/23/19 0939 11/23/19 1331 11/23/19 1605 11/23/19 1647 11/23/19 1900  HGB 10.3*   < > 12.2   < > 11.9*  --  9.4*  --   --   --   --   --   --   HCT 36.0   < > 36.0  --  35.0*  --  32.4*  --   --   --   --   --   --   PLT 302  --   --   --   --   --  258  --   --   --   --   --   --   APTT 43*  --   --   --   --   --   --   --   --  50*  --   --  86*  LABPROT 23.9*  --   --   --   --   --   --   --   --   --   --   --   --   INR 2.2*  --   --   --   --   --   --   --   --   --   --   --   --   HEPARINUNFRC  --   --   --   --   --   --   --   --   --  1.28*  --   --   --   CREATININE 10.12*   < > 10.80*   < >  --   --  9.68*   < > 9.27*  --  6.37* 5.90*  --   CKTOTAL  --   --   --   --   --   --   --   --  850*  --   --   --   --   TROPONINIHS  --   --   --   --   --  68*  --   --   --   --   --   --   --    < > = values in this interval  not displayed.    Estimated Creatinine Clearance: 10.5 mL/min (A) (by C-G formula based on SCr of 5.9 mg/dL (H)).   Medical History: Past Medical History:  Diagnosis Date  .  Anemia   . COPD (chronic obstructive pulmonary disease) ([place])   . Dyslipidemia   . Exogenous obesity   . GERD (gastroesophageal reflux disease)   . GI bleed 05/14/2016  . History of abnormal cells from cervix   . Hyperlipidemia   . Hypertension   . Iron deficiency anemia   . Longstanding persistent atrial fibrillation ([place])   . Microalbuminuria   . Tobacco dependence   . Type II diabetes mellitus [facility])       Assessment: 69 yo female with afib to start heparin while eliquis on hold (last dose 7/26 at ~ 8pm). She is noted with DKA and on CRRT with AKI -aPTT= 86  Goal of Therapy:  Heparin level 0.3-0.7 units/ml aPTT 66-102 seconds Monitor platelets by anticoagulation protocol: Yes   Plan: 2 -No heparin changes needed -Heparin level, aPTT and CBC in am  Hildred Laser, PharmD Clinical Pharmacist **Pharmacist phone directory can now be found on [email or web address] ([email or web address]).  Listed under [place].

## 2019-11-23 NOTE — Consult Note (Addendum)
[facility] Renal Consultation Note  Requesting MD: Ezequiel Essex, MD Indication for Consultation:  AKI, acidosis  Chief complaint: "couldn't breathe"  HPI: Paula Wheeler is a 69 y.o. female with a history including CKD stage IV, diabetes, hypertension, COPD, and GERD who presented to the hospital with difficulty breathing as well as abdominal pain.  She states that she has also been having nausea/vomiting the past couple of days as well with decreased p.o. intake.  She reports that she is on torsemide 20 mg twice daily at home.  She is not sure of the name of her other blood pressure medications; bisoprolol and triamterene-HCTZ are both listed below however it is unclear if this is accurate.  She follows with Dr. Hollie Salk at [facility].  She states that she had hoped that her kidney function would be stable and and she had not yet had a fistula placed.  Nephrology is consulted for acute kidney injury with peak creatinine today 10.8 and bicarbonate less than 7.  Her 2 brothers are here at bedside and they help her with medical decisions - Sandi Mariscal and Redge Gainer.  We discussed the risks, benefits, and indications for dialysis and she does consent to renal replacement therapy and her brothers are supportive.  In the ER, the patient was given insulin infusion with concurrent fluids per pulm.  We initiated bicarbonate gtt.  Blood pressure on my arrival was 71/49 with heart rate of 114.  She was 95% on room air.  Her ER provider has ordered levo.  ER also placed a nontunneled trialysis catheter RIJ.  Also in the ER she appropriately received contrasted study given concern for emergent vascular or acute abdominal process.  She has been found to have severe peripheral vascular disease and per nursing the pulses in her feet are not dopplerable.  She was ordered vanc and cefepime in the ER.   CT angio with stenosis of renal arteries - stenosis on the left measuring approximately 70%  and on the right measuring approximately 50%. No hydro   Creatinine, Ser  Date/Time Value Ref Range Status  11/23/2019 04:44 AM 9.68 (H) 0.44 - 1.00 mg/dL Final  11/23/2019 02:03 AM 10.80 (H) 0.44 - 1.00 mg/dL Final  11/23/2019 01:47 AM 10.12 (H) 0.44 - 1.00 mg/dL Final  05/11/2019 08:37 AM 2.48 (H) 0.57 - 1.00 mg/dL Final  01/30/2018 08:43 AM 2.62 (H) 0.44 - 1.00 mg/dL Final  01/29/2018 06:20 PM 2.83 (H) 0.44 - 1.00 mg/dL Final  10/15/2017 09:15 AM 2.33 (H) 0.57 - 1.00 mg/dL Final  09/18/2017 04:19 PM 2.21 (H) 0.57 - 1.00 mg/dL Final  09/17/2017 11:55 AM 1.69 (H) 0.57 - 1.00 mg/dL Final  03/31/2017 08:46 AM 2.01 (H) 0.57 - 1.00 mg/dL Final  10/28/2016 08:27 AM 1.96 (H) 0.57 - 1.00 mg/dL Final  09/16/2016 08:05 AM 1.90 (H) 0.57 - 1.00 mg/dL Final  08/20/2016 08:15 AM 1.92 (H) 0.57 - 1.00 mg/dL Final  08/02/2016 10:07 AM 1.74 (H) 0.57 - 1.00 mg/dL Final  05/16/2016 05:55 AM 1.09 (H) 0.44 - 1.00 mg/dL Final  05/15/2016 05:08 AM 1.28 (H) 0.44 - 1.00 mg/dL Final  05/14/2016 10:32 AM 1.38 (H) 0.44 - 1.00 mg/dL Final     PMHx:   Past Medical History:  Diagnosis Date  . Anemia   . COPD (chronic obstructive pulmonary disease) ([place])   . Dyslipidemia   . Exogenous obesity   . GERD (gastroesophageal reflux disease)   . GI bleed 05/14/2016  . History of abnormal  cells from cervix   . Hyperlipidemia   . Hypertension   . Iron deficiency anemia   . Longstanding persistent atrial fibrillation ([place])   . Microalbuminuria   . Tobacco dependence   . Type II diabetes mellitus ([place])     Past Surgical History:  Procedure Laterality Date  . CARDIOVERSION N/A 08/07/2016   Procedure: CARDIOVERSION;  Surgeon: Thayer Headings, MD;  Location: [facility];  Service: Cardiovascular;  Laterality: N/A;  . DIAGNOSTIC MAMMOGRAM  05/2015  . ESOPHAGOGASTRODUODENOSCOPY N/A 05/15/2016   Procedure: ESOPHAGOGASTRODUODENOSCOPY (EGD);  Surgeon: Wonda Horner, MD;  Location: [facility] ENDOSCOPY;  Service: Endoscopy;   Laterality: N/A;  . [place]   "I think they left my appendix"  . PAP SMEAR  2012 AND 2017    Family Hx:  Family History  Problem Relation Age of Onset  . Cancer Mother        BREAST  . Hypertension Mother   . Macular degeneration Mother   . Heart attack Father   . Diabetes Father   . Colon polyps Brother   . Colon polyps Brother   . Colon polyps Brother   . Colon polyps Brother   . Colon polyps Brother   . Colon polyps Brother   no family history of renal failure.  Brothers state both parents and siblings with HTN and DM  Social History:  reports that she has been smoking cigarettes. She has a 20.00 pack-year smoking history. She has never used smokeless tobacco. She reports current alcohol use. She reports that she does not use drugs.  Allergies:  Allergies  Allergen Reactions  . Nsaids Swelling    Hands and legs swell  . Penicillins Hives    Has patient had a PCN reaction causing immediate rash, facial/tongue/throat swelling, SOB or lightheadedness with hypotension: Yes Has patient had a PCN reaction causing severe rash involving mucus membranes or skin necrosis: No Has patient had a PCN reaction that required hospitalization: No Has patient had a PCN reaction occurring within the last 10 years: No If all of the above answers are "NO", then may proceed with Cephalosporin use.     Medications: Prior to Admission medications   Medication Sig Start Date End Date Taking? Authorizing Provider  apixaban (ELIQUIS) 5 MG TABS tablet Take 1 tablet (5 mg total) by mouth 2 (two) times daily. 10/08/19  Yes Nahser, Wonda Cheng, MD  albuterol (PROVENTIL HFA;VENTOLIN HFA) 108 (90 Base) MCG/ACT inhaler Inhale 2 puffs into the lungs every 4 (four) hours as needed for wheezing or shortness of breath.    [provider]  allopurinol (ZYLOPRIM) 100 MG tablet Take 100 mg by mouth daily.    [provider]  amLODipine (NORVASC) 10 MG tablet TAKE 1 TABLET(10  MG) BY MOUTH DAILY 10/28/19   Nahser, Wonda Cheng, MD  atorvastatin (LIPITOR) 40 MG tablet Take 1 tablet (40 mg total) by mouth at bedtime. 10/28/19   Nahser, Wonda Cheng, MD  bisoprolol (ZEBETA) 5 MG tablet Take 1 tablet (5 mg total) by mouth daily. 10/08/19   Nahser, Wonda Cheng, MD  Cyanocobalamin (VITAMIN B-12 IJ) Inject 1 mL as directed every 30 (thirty) days.     [provider]  escitalopram (LEXAPRO) 10 MG tablet Take 10 mg by mouth every evening.  03/11/17   [provider]  HYDROcodone-acetaminophen (NORCO/VICODIN) 5-325 MG tablet Take 1 tablet by mouth every 6 (six) hours as needed for moderate pain.    [provider]  insulin  NPH-regular Human (NOVOLIN 70/30) (70-30) 100 UNIT/ML injection Inject 35-60 Units into the skin See admin instructions. Inject 35 units into the skin in the morning and 60 units in the evening    [provider]  Insulin Syringe-Needle U-100 (B-D INS SYR ULTRAFINE 1CC/31G) 31G X 5/16" 1 ML MISC by Does not apply route.    [provider]  loratadine (CLARITIN) 10 MG tablet Take 10 mg by mouth daily.    [provider]  metFORMIN (GLUCOPHAGE) 500 MG tablet Take 1,000-1,500 mg by mouth See admin instructions. Take 1,000 mg by mouth in the morning and 1,500 mg in the evening    [provider]  pantoprazole (PROTONIX) 40 MG tablet Take 1 tablet (40 mg total) by mouth daily. 05/16/16   Ghimire, Henreitta Leber, MD  SPIRIVA HANDIHALER 18 MCG inhalation capsule Place 18 mcg into inhaler and inhale daily.  03/13/17   [provider]  torsemide (DEMADEX) 20 MG tablet TAKE 1 TABLET(20 MG) BY MOUTH DAILY 03/04/19   Nahser, Wonda Cheng, MD  TRIAMCINOLONE ACETONIDE EX Apply 1 application topically See admin instructions. Apply to both legs daily as directed    [provider]  triamterene-hydrochlorothiazide (MAXZIDE) 75-50 MG tablet Take 1 tablet by mouth daily.    [provider]    I have reviewed the  patient's current and reported prior to admission medications.   She cannot remember the names of some.  Included the list above generated by the system.   Labs:  BMP Latest Ref Rng & Units 11/23/2019 11/23/2019 11/23/2019  Glucose 70 - 99 mg/dL 289(H) - 355(H)  BUN 8 - 23 mg/dL 74(H) - 76(H)  Creatinine 0.44 - 1.00 mg/dL 9.68(H) - 10.80(H)  BUN/Creat Ratio 12 - 28 - - -  Sodium 135 - 145 mmol/L 135 131(L) 131(L)  Potassium 3.5 - 5.1 mmol/L 4.4 5.4(H) 5.4(H)  Chloride 98 - 111 mmol/L 95(L) - 99  CO2 22 - 32 mmol/L <7(L) - -  Calcium 8.9 - 10.3 mg/dL 7.0(L) - -    Urinalysis    Component Value Date/Time   COLORURINE AMBER (A) 11/23/2019 0239   APPEARANCEUR CLOUDY (A) 11/23/2019 0239   LABSPEC 1.016 11/23/2019 0239   PHURINE 5.0 11/23/2019 0239   GLUCOSEU NEGATIVE 11/23/2019 0239   HGBUR LARGE (A) 11/23/2019 0239   BILIRUBINUR NEGATIVE 11/23/2019 0239   KETONESUR NEGATIVE 11/23/2019 0239   PROTEINUR 30 (A) 11/23/2019 0239   NITRITE NEGATIVE 11/23/2019 0239   LEUKOCYTESUR NEGATIVE 11/23/2019 0239     ROS:  Pertinent items noted in HPI and remainder of comprehensive ROS otherwise negative.  Physical Exam: Vitals:   11/23/19 0530 11/23/19 0545  BP: (!) 76/33 (!) 71/49  Pulse: (!) 115 (!) 108  Resp: (!) 24 20  Temp:    SpO2: 97% 97%     General: adult female in stretcher ill appearing  HEENT: NCAT Eyes: EOMI sclera anicteric Neck: supple trachea midline Heart: tachycardic S1S2 no rub Lungs: clear but reduced on auscultation; tachypnea Abdomen: distended/obese habitus and tender to palpation  Extremities: no pitting edema; feet are cool bilaterally  Skin: mottled lower extremities Neuro: alert and oriented x  3 provides hx and follows commands; can't recall a couple of medications but otherwise able to provide hx Psych norma mood and affect Access RIJ nontunneled trialysis catheter  Assessment/Plan:  # Acute metabolic acidosis - Setting of AKI also with metformin  use and possible DKA.  With profound lactic acidosis.  - Initiating CRRT.  UF goal of zero UF for now - Continue bicarb gtt until she starts CRRT - Calcium ordered earlier - follow - Update ABG - stat - ordered  - If she stabilizes on low dose levo may be able to do iHD instead  - I have spoken with critical care and they are going to try and expedite her ICU bed   # Acute kidney injury - Ischemic and pre-renal insults in the setting of hypotension, diuretic use, n/v and sepsis.  CT with no hydro but with some degree of bilateral renal artery stenosis - Initiating CRRT - Check CK level   # CKD stage IV - Follows with Dr. Hollie Salk at [place] in [facility] was 2.48 in 04/2019 as above; CKA labs not available to me at this time  # Sepsis with Hypotension - Team is initiating levo  - Unclear source  - holding antihypertensives - work-up per pulm/critical care - abx per primary team   # Hyperkalemia - Improved with temporizing measures  # Diabetes with hyperglycemia; felt possible DKA per pulm.  On insulin gtt.  Note urine ketones negative.   - Management per pulm/critical care.  Would hold metformin indefinitely  # Peripheral vascular disease - Per primary team  # Hypophosphatemia - severe - replete and repeat phos  Will discuss with oncoming day-shift team, Dr. Joelyn Oms, as well  Claudia Desanctis 11/23/2019, 6:47 AM

## 2019-11-23 NOTE — ED Provider Notes (Addendum)
Patient seen in conjunction with PA-C at [facility].  Level 5 caveat for acuity of condition.  Patient from home with 2-day history of nausea, vomiting, abdominal pain.  Hypotensive in the 70s for EMS.  Patient with a history of COPD, GI bleed, atrial fibrillation.  She describes diffuse abdominal pain worse in the left side as well as cramping in her legs from the knees down bilaterally.  She appears to be in pain and is uncomfortable.  Pale appearing.  She has rate controlled atrial fibrillation.  Left-sided abdominal tenderness without guarding or rebound.  Equal femoral pulses bilaterally  Bilaterally her feet are cold and mottled.  Unable to palpate or Doppler PT or DP pulses bilaterally. BP (!) 107/94   Pulse (!) 114   Temp (!) 95.2 F (35.1 C) (Rectal)   Resp 16   Ht 5\' 5"  (1.651 m)   Wt (!) 104.3 kg   SpO2 99%   BMI 38.27 kg/m    .Central Line  Date/Time: 11/23/2019 4:32 AM Performed by: Ezequiel Essex, MD Authorized by: Ezequiel Essex, MD   Consent:    Consent obtained:  Verbal and emergent situation   Consent given by:  Patient   Risks discussed:  Arterial puncture, bleeding, infection, incorrect placement, pneumothorax and nerve damage   Alternatives discussed:  No treatment Pre-procedure details:    Hand hygiene: Hand hygiene performed prior to insertion     Sterile barrier technique: All elements of maximal sterile technique followed     Skin preparation:  ChloraPrep   Skin preparation agent: Skin preparation agent completely dried prior to procedure   Anesthesia (see MAR for exact dosages):    Anesthesia method:  Local infiltration   Local anesthetic:  Lidocaine 1% w/o epi Procedure details:    Location:  R internal jugular   Patient position:  Trendelenburg   Procedural supplies: Trialysis catheter.   Catheter size: 52F.   Landmarks identified: yes     Ultrasound guidance: yes     Sterile ultrasound techniques: Sterile gel and sterile probe covers were used      Number of attempts:  1   Successful placement: yes   Post-procedure details:    Post-procedure:  Dressing applied and line sutured   Assessment:  Blood return through all ports, free fluid flow, no pneumothorax on x-ray and placement verified by x-ray   Patient tolerance of procedure:  Tolerated well, no immediate complications  .Critical Care Performed by: Ezequiel Essex, MD Authorized by: Ezequiel Essex, MD   Critical care provider statement:    Critical care time (minutes):  75   Critical care was necessary to treat or prevent imminent or life-threatening deterioration of the following conditions:  Sepsis, shock, metabolic crisis, renal failure, circulatory failure, dehydration and endocrine crisis   Critical care was time spent personally by me on the following activities:  Discussions with consultants, evaluation of patient's response to treatment, examination of patient, ordering and performing treatments and interventions, ordering and review of laboratory studies, ordering and review of radiographic studies, pulse oximetry, re-evaluation of patient's condition, obtaining history from patient or surrogate and review of old charts    MDM  Patient here with anion gap acidosis of unclear etiology.  There is concern for ischemic colitis given her elevated lactate of 11.  pH is 6.7, bicarb is undetectable, anion gap 33.  Found to have creatinine of 10 when her baseline is in the 2.5 range.  Patient started on IV fluids as well as IV antibiotics.  Patient likely needs vascular imaging to rule out vascular catastrophe in her abdomen as well as rule out acute limb ischemia given lack of pulses in her feet.  Discussed with patient that she will likely need dialysis given her acute renal failure and profound acidosis.  She has hypokalemia of 5.4 with some ST depressions diffusely in her EKG. May have component of DKA as well but sugars only 300s. Discussed with nephrology who will  evaluate patient and agrees with IV fluid resuscitation and IV bicarbonate.  Nephrology agrees she needs IV contrast to rule out vascular pathology and likely need dialysis after.  Risks and benefits of IV contrast discussed with patient and she agrees to proceed with contrast to rule out life-threatening pathology such as intraabodominal vascular catastrophe or critical limb ischemia.  She understands that she may need dialysis afterwards which may be permanent.  CT scan shows no aortic dissection or aneurysm.  There is no evidence of acute critical limb ischemia but she does have severe peripheral vascular disease.  Unclear source of her metabolic acidosis and lactic acidosis.  CT scan without acute vascular catastrophe.  Sepsis considered and she is treated appropriately for such and given IV fluids and IV antibiotics after cultures were obtained. Her renal function may be the source of her acidosis.  Treated for DKA as well with IV insulin gtt.  Central line access was needed.  Dialysis catheter placed in anticipation of hemodialysis.  Discussed with nephrology as well as critical care.  She is continued IV fluids, IV antibiotic, IV bicarbonate drip. Continues to protect airway and mental status appears to be improving.  Patient's brothers are updated at bedside.  She desires to be a full code at this point.  Paula Wheeler was evaluated in Emergency Department on 11/23/2019 for the symptoms described in the history of present illness. She was evaluated in the context of the global COVID-19 pandemic, which necessitated consideration that the patient might be at risk for infection with the SARS-CoV-2 virus that causes COVID-19. Institutional protocols and algorithms that pertain to the evaluation of patients at risk for COVID-19 are in a state of rapid change based on information released by regulatory bodies including the CDC and federal and state organizations. These policies and algorithms  were followed during the patient's care in the ED.       Ezequiel Essex, MD 11/23/19 4128    Ezequiel Essex, MD 11/23/19 [phone]

## 2019-11-23 NOTE — Progress Notes (Signed)
ANTICOAGULATION CONSULT NOTE - Initial Consult  Pharmacy Consult for heparin Indication: atrial fibrillation  Allergies  Allergen Reactions  . Nsaids Swelling    Hands and legs swell  . Penicillins Hives    Has patient had a PCN reaction causing immediate rash, facial/tongue/throat swelling, SOB or lightheadedness with hypotension: Yes Has patient had a PCN reaction causing severe rash involving mucus membranes or skin necrosis: No Has patient had a PCN reaction that required hospitalization: No Has patient had a PCN reaction occurring within the last 10 years: No If all of the above answers are "NO", then may proceed with Cephalosporin use.     Patient Measurements: Height: 5\' 5"  (165.1 cm) Weight: (!) 104.3 kg (230 lb) IBW/kg (Calculated) : 57 Heparin Dosing Weight: 81.2 kg  Vital Signs: Temp: 95.2 F (35.1 C) (07/27 0256) Temp Source: Rectal (07/27 0256) BP: 107/94 (07/27 0230) Pulse Rate: 114 (07/27 0230)  Labs: Recent Labs    11/23/19 0147 11/23/19 0147 11/23/19 0203 11/23/19 0218  HGB 10.3*   < > 12.2 11.9*  HCT 36.0  --  36.0 35.0*  PLT 302  --   --   --   APTT 43*  --   --   --   LABPROT 23.9*  --   --   --   INR 2.2*  --   --   --   CREATININE 10.12*  --  10.80*  --    < > = values in this interval not displayed.    Estimated Creatinine Clearance: 5.9 mL/min (A) (by C-G formula based on SCr of 10.8 mg/dL (H)).   Medical History: Past Medical History:  Diagnosis Date  . Anemia   . COPD (chronic obstructive pulmonary disease) ([place])   . Dyslipidemia   . Exogenous obesity   . GERD (gastroesophageal reflux disease)   . GI bleed 05/14/2016  . History of abnormal cells from cervix   . Hyperlipidemia   . Hypertension   . Iron deficiency anemia   . Longstanding persistent atrial fibrillation ([place])   . Microalbuminuria   . Tobacco dependence   . Type II diabetes mellitus ([facility])     Medications:  See medication history  Assessment: 69 yo lady  with afib to start heparin while eliquis on hold.  Hg 11.9, PTLC 302 Baseline aPTT 43 sec Goal of Therapy:  Heparin level 0.3-0.7 units/ml aPTT 66-102 seconds Monitor platelets by anticoagulation protocol: Yes   Plan:  Start heparin drip at 1200 units/hr Check aPTT and HL ~8 hours after start  Zayyan Mullen Poteet 11/23/2019,4:40 AM

## 2019-11-23 NOTE — Procedures (Signed)
Admit: 11/23/2019 LOS: 0  37F CKD4 with severe lactic metabolic acidosis, hyeprkalemia, AoCKD  Current CRRT Prescription: Start Date: 7/27 Catheter: R IJ Temp placed 7/27 in ED BFR: 200 Pre Blood Pump: 400 4K DFR: 2000 4K Replacement Rate: 300 4K Goal UF: Net even Anticoagulation: none Clotting: none  S:  Just started CRRT,  Remains on NE  Awake/alert  Tachypneic  O: No intake/output data recorded.  Filed Weights   11/23/19 0242  Weight: (!) 104.3 kg    Recent Labs  Lab 11/23/19 0147 11/23/19 0147 11/23/19 0203 11/23/19 0203 11/23/19 0218 11/23/19 0444 11/23/19 0939  NA 133*   < > 131*   < > 131* 135 136  K 5.7*   < > 5.4*   < > 5.4* 4.4 4.6  CL 93*   < > 99  --   --  95* 96*  CO2 <7*  --   --   --   --  <7* 8*  GLUCOSE 381*   < > 355*  --   --  289* 171*  BUN 75*   < > 76*  --   --  74* 74*  CREATININE 10.12*   < > 10.80*  --   --  9.68* 9.27*  CALCIUM 7.2*  --   --   --   --  7.0* 7.3*  PHOS  --   --   --   --   --  >30.0* 10.3*   < > = values in this interval not displayed.   Recent Labs  Lab 11/23/19 0147 11/23/19 0147 11/23/19 0203 11/23/19 0218 11/23/19 0444  WBC 20.1*  --   --   --  23.3*  NEUTROABS 16.8*  --   --   --   --   HGB 10.3*   < > 12.2 11.9* 9.4*  HCT 36.0   < > 36.0 35.0* 32.4*  MCV 111.8*  --   --   --  107.6*  PLT 302  --   --   --  258   < > = values in this interval not displayed.    Scheduled Meds:  ipratropium-albuterol  3 mL Nebulization Q6H   vancomycin variable dose per unstable renal function (pharmacist dosing)   Does not apply See admin instructions   Continuous Infusions:   prismasol BGK 4/2.5      prismasol BGK 4/2.5     sodium chloride Stopped (11/23/19 0720)   [START ON 11/24/2019] ceFEPime (MAXIPIME) IV     dextrose 5 % and 0.45% NaCl 100 mL/hr (11/23/19 1017)   heparin     insulin 3.2 Units/hr (11/23/19 0945)   norepinephrine (LEVOPHED) Adult infusion 6 mcg/min (11/23/19 0945)   prismasol  BGK 4/2.5      sodium bicarbonate (isotonic) infusion in sterile water 125 mL/hr at 11/23/19 1018   sodium phosphate  Dextrose 5% IVPB     PRN Meds:.albuterol, dextrose, docusate sodium, heparin, polyethylene glycol  ABG    Component Value Date/Time   HCO3 3.0 (L) 11/23/2019 0218   TCO2 <5 (L) 11/23/2019 0218   ACIDBASEDEF 30.0 (H) 11/23/2019 0218   O2SAT 91.0 11/23/2019 0218    A/P  1. Dialysis dependent AoCKD4 2. Shock 3. Severe AG metabolic acidosis, inc lactate; on metformin at admit 4. Hyperkalemia, improved 5. Hyperphosphatemia, trend on CRRT, ? If artifact 6. DM2, hyperglycemia, per TRH 7. Severe PVD 8. COPD  Cont CRRT at current settings. Trend labs.  Stop NaHCO3 gtt.  Suspect will  req prolonged / indefinite dialysis dependence.  Keep even for now.    [place] Grippe, MD [facility]

## 2019-11-23 NOTE — Progress Notes (Signed)
Pharmacy Antibiotic Note  Paula Wheeler is a 69 y.o. female admitted on 11/23/2019 with sepsis.  Pharmacy has been consulted for vancomycin and cefepime dosing. She has stage 4 CKD and SrCr may also be elevated from dehydration.  Plan: Vancomycin 2gm IV x 1 then dose based on levels/renal function Cefepime 2gm IV x 1 then 1gm IV q24 hours F/u renal function, cultures and clinica lcourse  Height: 5\' 5"  (165.1 cm) Weight: (!) 104.3 kg (230 lb) IBW/kg (Calculated) : 57  Temp (24hrs), Avg:98.4 F (36.9 C), Min:98.4 F (36.9 C), Max:98.4 F (36.9 C)  Recent Labs  Lab 11/23/19 0147 11/23/19 0203  WBC 20.1*  --   CREATININE  --  10.80*    Estimated Creatinine Clearance: 5.9 mL/min (A) (by C-G formula based on SCr of 10.8 mg/dL (H)).    Allergies  Allergen Reactions  . Nsaids Swelling    Hands and legs swell  . Penicillins Hives    Has patient had a PCN reaction causing immediate rash, facial/tongue/throat swelling, SOB or lightheadedness with hypotension: Yes Has patient had a PCN reaction causing severe rash involving mucus membranes or skin necrosis: No Has patient had a PCN reaction that required hospitalization: No Has patient had a PCN reaction occurring within the last 10 years: No If all of the above answers are "NO", then may proceed with Cephalosporin use.     Thank you for allowing pharmacy to be a part of this patient's care.  Excell Seltzer Poteet 11/23/2019 2:44 AM

## 2019-11-23 NOTE — ED Notes (Signed)
Provider notified of critical lab

## 2019-11-23 NOTE — ED Notes (Signed)
[facility] notified of VBG results

## 2019-11-23 NOTE — Consult Note (Signed)
Referring Physician: [place]  Patient name: Paula Wheeler MRN: [id number] DOB: [birth year] Sex: female  REASON FOR CONSULT: Absent Doppler signals in the feet  HPI: Paula Wheeler is a 69 y.o. female, who presented to the ER earlier today with complaints of nausea vomiting shortness of breath.  She did not complain of any numbness or tingling in her feet or any significant leg pain.  She has a known history of peripheral arterial disease.  ABIs in 2019 were 0.4 on the right 0.5 on the left.  She has previously seen Dr. Carlis Abbott in the past.  She is currently awake and does not have any complaints in her feet.  She has no numbness or tingling in her feet currently.  She has known short distance claudication which she states has not changed recently.  During this hospital admission she has been noted to be have acute worsening of chronic renal failure and is going to require hemodialysis.  She is currently on Levophed.  Other medical problems include COPD hyperlipidemia obesity hypertension atrial fibrillation tobacco abuse diabetes all of which are currently stable.  She is on Eliquis for her atrial fibrillation.  Past Medical History:  Diagnosis Date   Anemia    COPD (chronic obstructive pulmonary disease) (HCC)    Dyslipidemia    Exogenous obesity    GERD (gastroesophageal reflux disease)    GI bleed 05/14/2016   History of abnormal cells from cervix    Hyperlipidemia    Hypertension    Iron deficiency anemia    Longstanding persistent atrial fibrillation (HCC)    Microalbuminuria    Tobacco dependence    Type II diabetes mellitus ([place])    Past Surgical History:  Procedure Laterality Date   CARDIOVERSION N/A 08/07/2016   Procedure: CARDIOVERSION;  Surgeon: Thayer Headings, MD;  Location: [facility];  Service: Cardiovascular;  Laterality: N/A;   DIAGNOSTIC MAMMOGRAM  05/2015   ESOPHAGOGASTRODUODENOSCOPY N/A 05/15/2016   Procedure: ESOPHAGOGASTRODUODENOSCOPY  (EGD);  Surgeon: Wonda Horner, MD;  Location[facility] ENDOSCOPY;  Service: Endoscopy;  Laterality: N/A;   EXPLORATORY LAPAROTOMY  1968   "I think they left my appendix"   PAP SMEAR  2012 AND 2017    Family History  Problem Relation Age of Onset   Cancer Mother        BREAST   Hypertension Mother    Macular degeneration Mother    Heart attack Father    Diabetes Father    Colon polyps Brother    Colon polyps Brother    Colon polyps Brother    Colon polyps Brother    Colon polyps Brother    Colon polyps Brother     SOCIAL HISTORY: Social History   Socioeconomic History   Marital status: Single    Spouse name: Not on file   Number of children: Not on file   Years of education: Not on file   Highest education level: Not on file  Occupational History   Not on file  Tobacco Use   Smoking status: Current Every Day Smoker    Packs/day: 0.50    Years: 40.00    Pack years: 20.00    Types: Cigarettes   Smokeless tobacco: Never Used  Vaping Use   Vaping Use: Never used  Substance and Sexual Activity   Alcohol use: Yes    Comment: 05/14/2016 "I'll have a drink once/year; New Year's"   Drug use: No   Sexual activity: Never  Other Topics Concern  Not on file  Social History Narrative   Not on file   Social Determinants of Health   Financial Resource Strain:    Difficulty of Paying Living Expenses:   Food Insecurity:    Worried About Charity fundraiser in the Last Year:    Arboriculturist in the Last Year:   Transportation Needs:    Film/video editor (Medical):    Lack of Transportation (Non-Medical):   Physical Activity:    Days of Exercise per Week:    Minutes of Exercise per Session:   Stress:    Feeling of Stress :   Social Connections:    Frequency of Communication with Friends and Family:    Frequency of Social Gatherings with Friends and Family:    Attends Religious Services:    Active Member of Clubs or  Organizations:    Attends Archivist Meetings:    Marital Status:   Intimate Partner Violence:    Fear of Current or Ex-Partner:    Emotionally Abused:    Physically Abused:    Sexually Abused:     Allergies  Allergen Reactions   Nsaids Swelling    Hands and legs swell   Penicillins Hives    Has patient had a PCN reaction causing immediate rash, facial/tongue/throat swelling, SOB or lightheadedness with hypotension: Yes Has patient had a PCN reaction causing severe rash involving mucus membranes or skin necrosis: No Has patient had a PCN reaction that required hospitalization: No Has patient had a PCN reaction occurring within the last 10 years: No If all of the above answers are "NO", then may proceed with Cephalosporin use.     Current Facility-Administered Medications  Medication Dose Route Frequency Provider Last Rate Last Admin    prismasol BGK 4/2.5 infusion   CRRT Continuous Claudia Desanctis, MD        prismasol BGK 4/2.5 infusion   CRRT Continuous Claudia Desanctis, MD       0.9 %  sodium chloride infusion   Intravenous Continuous Desai, Rahul P, PA-C   Stopped at 11/23/19 0720   albuterol (PROVENTIL) (2.5 MG/3ML) 0.083% nebulizer solution 2.5 mg  2.5 mg Nebulization Q3H PRN Shearon Stalls, Rahul P, PA-C       [START ON 11/24/2019] ceFEPIme (MAXIPIME) 1 g in sodium chloride 0.9 % 100 mL IVPB  1 g Intravenous Q24H Rancour, Stephen, MD       dextrose 5 %-0.45 % sodium chloride infusion   Intravenous Continuous Desai, Rahul P, PA-C 100 mL/hr at 11/23/19 0534 New Bag at 11/23/19 0534   dextrose 50 % solution 0-50 mL  0-50 mL Intravenous PRN Providence Lanius A, PA-C       docusate sodium (COLACE) capsule 100 mg  100 mg Oral BID PRN Shearon Stalls, Rahul P, PA-C       heparin ADULT infusion 100 units/mL (25000 units/283mL sodium chloride 0.45%)  1,200 Units/hr Intravenous Continuous Brand Males, MD       heparin injection 1,000-6,000 Units  1,000-6,000 Units CRRT PRN  Claudia Desanctis, MD       insulin regular, human (MYXREDLIN) 100 units/ 100 mL infusion   Intravenous Continuous Layden, Lindsey A, PA-C 7.5 mL/hr at 11/23/19 0529 7.5 Units/hr at 11/23/19 0529   ipratropium-albuterol (DUONEB) 0.5-2.5 (3) MG/3ML nebulizer solution 3 mL  3 mL Nebulization Q6H Desai, Rahul P, PA-C   3 mL at 11/23/19 0452   norepinephrine (LEVOPHED) 4mg  in 278mL premix infusion  0-40 mcg/min Intravenous Continuous  Providence Lanius A, PA-C 22.5 mL/hr at 11/23/19 0724 6 mcg/min at 11/23/19 0724   polyethylene glycol (MIRALAX / GLYCOLAX) packet 17 g  17 g Oral Daily PRN Shearon Stalls, Rahul P, PA-C       prismasol BGK 4/2.5 infusion   CRRT Continuous Claudia Desanctis, MD       sodium bicarbonate 150 mEq in sterile water 1,000 mL infusion   Intravenous Continuous Rancour, Stephen, MD 125 mL/hr at 11/23/19 0344 New Bag at 11/23/19 0344   sodium phosphate 30 mmol in dextrose 5 % 250 mL infusion  30 mmol Intravenous Once Claudia Desanctis, MD       vancomycin variable dose per unstable renal function (pharmacist dosing)   Does not apply See admin instructions Rancour, Annie Main, MD       Current Outpatient Medications  Medication Sig Dispense Refill   apixaban (ELIQUIS) 5 MG TABS tablet Take 1 tablet (5 mg total) by mouth 2 (two) times daily. 180 tablet 1   albuterol (PROVENTIL HFA;VENTOLIN HFA) 108 (90 Base) MCG/ACT inhaler Inhale 2 puffs into the lungs every 4 (four) hours as needed for wheezing or shortness of breath.     allopurinol (ZYLOPRIM) 100 MG tablet Take 100 mg by mouth daily.     amLODipine (NORVASC) 10 MG tablet TAKE 1 TABLET(10 MG) BY MOUTH DAILY 90 tablet 1   atorvastatin (LIPITOR) 40 MG tablet Take 1 tablet (40 mg total) by mouth at bedtime. 90 tablet 1   bisoprolol (ZEBETA) 5 MG tablet Take 1 tablet (5 mg total) by mouth daily. 90 tablet 1   Cyanocobalamin (VITAMIN B-12 IJ) Inject 1 mL as directed every 30 (thirty) days.      escitalopram (LEXAPRO) 10 MG tablet Take 10 mg  by mouth every evening.   12   HYDROcodone-acetaminophen (NORCO/VICODIN) 5-325 MG tablet Take 1 tablet by mouth every 6 (six) hours as needed for moderate pain.     insulin NPH-regular Human (NOVOLIN 70/30) (70-30) 100 UNIT/ML injection Inject 35-60 Units into the skin See admin instructions. Inject 35 units into the skin in the morning and 60 units in the evening     Insulin Syringe-Needle U-100 (B-D INS SYR ULTRAFINE 1CC/31G) 31G X 5/16" 1 ML MISC by Does not apply route.     loratadine (CLARITIN) 10 MG tablet Take 10 mg by mouth daily.     metFORMIN (GLUCOPHAGE) 500 MG tablet Take 1,000-1,500 mg by mouth See admin instructions. Take 1,000 mg by mouth in the morning and 1,500 mg in the evening     pantoprazole (PROTONIX) 40 MG tablet Take 1 tablet (40 mg total) by mouth daily. 60 tablet 0   SPIRIVA HANDIHALER 18 MCG inhalation capsule Place 18 mcg into inhaler and inhale daily.      torsemide (DEMADEX) 20 MG tablet TAKE 1 TABLET(20 MG) BY MOUTH DAILY 90 tablet 2   TRIAMCINOLONE ACETONIDE EX Apply 1 application topically See admin instructions. Apply to both legs daily as directed     triamterene-hydrochlorothiazide (MAXZIDE) 75-50 MG tablet Take 1 tablet by mouth daily.      ROS:   General:  No weight loss, Fever, chills  HEENT: No recent headaches, no nasal bleeding, no visual changes, no sore throat  Neurologic: No dizziness, blackouts, seizures. No recent symptoms of stroke or mini- stroke. No recent episodes of slurred speech, or temporary blindness.  Cardiac: No recent episodes of chest pain/pressure, + shortness of breath at rest.  + shortness of breath with exertion.  +  history of atrial fibrillation or irregular heartbeat  Vascular: No history of rest pain in feet.  + history of claudication.  No history of non-healing ulcer, No history of DVT   Pulmonary: No home oxygen, no productive cough, no hemoptysis,  + asthma or wheezing  Musculoskeletal:  [X]  Arthritis, [X]  Low  back pain,  [X]  Joint pain  Hematologic:No history of hypercoagulable state.  No history of easy bleeding.  No history of anemia  Gastrointestinal: No hematochezia or melena,  No gastroesophageal reflux, no trouble swallowing  Urinary: [X]  chronic Kidney disease, [ ]  on HD - [ ]  MWF or [ ]  TTHS, [ ]  Burning with urination, [ ]  Frequent urination, [ ]  Difficulty urinating;   Skin: No rashes  Psychological: No history of anxiety,  No history of depression   Physical Examination  Vitals:   11/23/19 0630 11/23/19 0635 11/23/19 0646 11/23/19 0700  BP: (!) 101/46 (!) 94/48 (!) 86/48 (!) 100/49  Pulse: (!) 118 (!) 130 (!) 118 (!) 122  Resp: (!) 24 15 17  (!) 25  Temp:      TempSrc:      SpO2: 94% 94% 93% 94%  Weight:      Height:        Body mass index is 38.27 kg/m.  General:  Alert and oriented, some work of breathing HEENT: Normal Neck: No JVD Cardiac: Regular Rate and Rhythm Abdomen: Soft, mildly tender obese Skin: No rash Extremity Pulses:  2+ radial, brachial, femoral, absent popliteal dorsalis pedis, posterior tibial pulses bilaterally, no pedal doppler signals Musculoskeletal: No deformity or edema  Neurologic: Upper and lower extremity motor 5/5 and symmetric, sensation in feet intact to light touch  DATA:  Patient had a CT angiogram of the chest abdomen and pelvis.  There was some calcification at the left subclavian origin.  There is no dissection or aneurysm.  She had mild stenosis of the celiac artery with a widely patent superior mesenteric artery and bilateral proximal 50 to 70% renal artery stenosis.  She has severe calcification of the infrarenal abdominal aorta which is circumferential in nature.  She has diffuse calcific disease in both iliac arteries.  She has multiple segments of skip occlusion in the common and external iliac arteries bilaterally.  She has bilateral superficial femoral artery occlusions.  She does feel the tibial vessels briefly distally but  there is poor contrast opacification.  All of these lesions appear chronic in nature.  She does not have any evidence of embolic disease.  White blood cell count 20,000, PTT 43, INR 2.2, troponin 57, potassium 5.7, lactate greater than 11, creatinine 10 increased from baseline of 3  ASSESSMENT: Chronic peripheral arterial disease with physical findings made worse by current pressor support with Levophed.  The patient currently has no symptoms of numbness and tingling or aching in her feet.  She is awake and reliable in her exam.  She has multiple other significant life-threatening problems including hypotension and significant renal failure which are currently more pressing than her peripheral arterial disease.   PLAN: Avoid trauma to the feet to prevent wounds.  Wean pressors as able to improve peripheral perfusion consideration for revascularization only if she recovers significantly from the above event.  She currently is not a candidate for revascularization.  I do not feel that her peripheral arterial disease is the cause of her current situation.  She has no evidence of embolic disease from her A. fib currently.   Ruta Hinds, MD [facility] Office: [phone] Pager: [phone]

## 2019-11-23 NOTE — Progress Notes (Signed)
CRITICAL VALUE ALERT  Critical Value:  Veinous PH 7.184  Date & Time Notied:  11/23/19 1640   Provider Notified: Elsworth Soho MD  Orders Received/Actions taken: no new orders  Kathleene Hazel RN

## 2019-11-23 NOTE — ED Triage Notes (Signed)
Pt from home, called out for SOB and abdominal pn since Saturday, low intake as well.  Initial bp was 100/80 dropped to 70/50, given 500cc NS increased to 94/50.  C/o dry mouth.   CBG 340 afib 130 - 80

## 2019-11-23 NOTE — H&P (Addendum)
NAME:  Paula Wheeler, MRN:  [id number], DOB:  1951-02-27, LOS: 0 ADMISSION DATE:  11/23/2019, CONSULTATION DATE:  11/23/19 REFERRING MD:  Wyvonnia Dusky  CHIEF COMPLAINT:  Abd pain   Brief History   Paula Wheeler is a 69 y.o. female who was admitted 7/27 with sepsis of unclear etiology, significant AoCKD, and multiple metabolic derangements.  Possible component DKA.  History of present illness   Pt is encephelopathic; therefore, this HPI is obtained from chart review. Paula Wheeler is a 69 y.o. female who has a PMH as outlined below.  She [facility]27 with generalized abdominal pain, LE pain, nausea/vomiting. She had not been able to tolerate any PO for 2 days.  Abdominal pain is somewhat chronic but recently worse mainly over left lower side.  LE pain also chronic due to claudication, but worse over past 2 days  In ED, she was found to be hypotensive and had multiple metabolic derangements including significant AoCKD and profound lactic acidosis.  There is also some concern for DKA.  CTA abd / pelv and CTA aorto bifem pending.  Nephrology has been consulted by EDP and recommending continuing bicarb infusion for now.  She might require dialysis at some point.  Past Medical History  has GI bleed; Symptomatic anemia; Diabetes mellitus with complication ([place]); Essential hypertension; HLD (hyperlipidemia); Anemia; Renal insufficiency; Persistent atrial fibrillation; PAD (peripheral artery disease) ([place]); Acute renal failure superimposed on stage 4 chronic kidney disease ([place]); Type II diabetes mellitus with renal manifestations ([place]); GERD (gastroesophageal reflux disease); Depression; Tobacco abuse; CKD (chronic kidney disease) stage 4, GFR 15-29 ml/min (HCC); SOB (shortness of breath); Elevated troponin; Leukocytosis; Chronic diastolic CHF (congestive heart failure) ([place]); and Acute renal failure (ARF) ([place]) on their problem list.  Significant Hospital Events   7/27 >  admit.  Consults:  Nephrology.  Procedures:  HD cath pending 7/27 >   Significant Diagnostic Tests:  CTA abd / pelv 7/27 >  CTA aorto bifem 7/27 >   Micro Data:  COVID 7/27 >  Blood 7/27 >  Urine 7/27 >   Antimicrobials:  Vanc 7/27 >  Cefepime 7/27 >    Interim history/subjective:  BP responding to fluids.  Objective:  Blood pressure (!) 107/94, pulse (!) 114, temperature (!) 95.2 F (35.1 C), temperature source Rectal, resp. rate 16, height 5\' 5"  (1.651 m), weight (!) 104.3 kg, SpO2 99 %.       No intake or output data in the 24 hours ending 11/23/19 0404 Filed Weights   11/23/19 0242  Weight: (!) 104.3 kg    Examination: General: Adult female, critically ill. Neuro: Somnolent but arouses to voice. HEENT: [place]/AT. Sclerae anicteric.  EOMI. Cardiovascular: IRIR, no M/R/G.  Lungs: Respirations even and unlabored.  CTA bilaterally, No W/R/R.  Abdomen: BS x 4, soft, Tender to deep palpation. Musculoskeletal: No gross deformities, no edema.  Skin: LE slightly mottled.  Skin warm, no rashes.  Assessment & Plan:   Sepsis of unclear etiology. - Continue fluids and empiric abx. - Follow cultures.  Profound lactic acidosis - exacerbated by metformin. AoCKD. - Nephrology consulted, might require dialysis at some point. - Continue bicarb gtt. - Trend lactate. - BMP q12 hrs.  Hyperkalemia - s/p temporizing measures in ED. Hyponatremia - 2/2 hyperglycemia and dehydration. - Continue supportive care. - Follow BMP.  Possible DKA. - Assess beta-hydroxybutyric acid. - Continue insulin gtt empirically.  Abdominal pain and LE pain - ? Dissection vs occlusion as no DP  or PT pulses dopplerable. - F/u on CTA abd / pelv and CTA aorto bifemoral. - EDP to discuss with vascular.  Hx a.fib, HTN, HLD. - Heparin gtt in lieu of home eliquis. - Hold home amlodipine, atorvastatin, bisoprolol, torsemide.  Hx DM2. - Hold home novolin, metformin.  Hx tobacco dependence,  possible COPD (no PFT's). - BD's. - Hold home spiriva. - Tobacco cessation counseling.  Best Practice:  Diet: NPO. Pain/Anxiety/Delirium protocol (if indicated): N/A. VAP protocol (if indicated): N/A. DVT prophylaxis: SCD's / Heparin. GI prophylaxis: N/A. Glucose control: Insulin gtt. Mobility: Bedrest. Code Status: Full. Family Communication: Brother updated by EDP. Disposition: ICU.  Labs   CBC: Recent Labs  Lab 11/23/19 0147 11/23/19 0203 11/23/19 0218  WBC 20.1*  --   --   NEUTROABS 16.8*  --   --   HGB 10.3* 12.2 11.9*  HCT 36.0 36.0 35.0*  MCV 111.8*  --   --   PLT 302  --   --    Basic Metabolic Panel: Recent Labs  Lab 11/23/19 0147 11/23/19 0203 11/23/19 0218  NA 133* 131* 131*  K 5.7* 5.4* 5.4*  CL 93* 99  --   CO2 <7*  --   --   GLUCOSE 381* 355*  --   BUN 75* 76*  --   CREATININE 10.12* 10.80*  --   CALCIUM 7.2*  --   --    GFR: Estimated Creatinine Clearance: 5.9 mL/min (A) (by C-G formula based on SCr of 10.8 mg/dL (H)). Recent Labs  Lab 11/23/19 0147  WBC 20.1*  LATICACIDVEN >11.0*   Liver Function Tests: Recent Labs  Lab 11/23/19 0147  AST 17  ALT 7  ALKPHOS 85  BILITOT 0.8  PROT 6.1*  ALBUMIN 3.2*   No results for input(s): LIPASE, AMYLASE in the last 168 hours. No results for input(s): AMMONIA in the last 168 hours. ABG    Component Value Date/Time   HCO3 3.0 (L) 11/23/2019 0218   TCO2 <5 (L) 11/23/2019 0218   ACIDBASEDEF 30.0 (H) 11/23/2019 0218   O2SAT 91.0 11/23/2019 0218    Coagulation Profile: Recent Labs  Lab 11/23/19 0147  INR 2.2*   Cardiac Enzymes: No results for input(s): CKTOTAL, CKMB, CKMBINDEX, TROPONINI in the last 168 hours. HbA1C: Hgb A1c MFr Bld  Date/Time Value Ref Range Status  01/30/2018 08:43 AM 6.7 (H) 4.8 - 5.6 % Final    Comment:    (NOTE)         Prediabetes: 5.7 - 6.4         Diabetes: >6.4         Glycemic control for adults with diabetes: <7.0    CBG: Recent Labs  Lab  11/23/19 0238 11/23/19 0352  GLUCAP 353* 291*    Review of Systems:   Unable to obtain as pt is encephalopathic.  Past medical history  She,  has a past medical history of Anemia, COPD (chronic obstructive pulmonary disease) ([place]), Dyslipidemia, Exogenous obesity, GERD (gastroesophageal reflux disease), GI bleed (05/14/2016), History of abnormal cells from cervix, Hyperlipidemia, Hypertension, Iron deficiency anemia, Longstanding persistent atrial fibrillation ([place]), Microalbuminuria, Tobacco dependence, and Type II diabetes mellitus ([place]).   Surgical History    Past Surgical History:  Procedure Laterality Date  . CARDIOVERSION N/A 08/07/2016   Procedure: CARDIOVERSION;  Surgeon: Thayer Headings, MD;  Location: [place];  Service: Cardiovascular;  Laterality: N/A;  . DIAGNOSTIC MAMMOGRAM  05/2015  . ESOPHAGOGASTRODUODENOSCOPY N/A 05/15/2016   Procedure: ESOPHAGOGASTRODUODENOSCOPY (EGD);  Surgeon:  Wonda Horner, MD;  Location: [facility] ENDOSCOPY;  Service: Endoscopy;  Laterality: N/A;  . [place]   "I think they left my appendix"  . PAP SMEAR  2012 AND 2017     Social History   reports that she has been smoking cigarettes. She has a 20.00 pack-year smoking history. She has never used smokeless tobacco. She reports current alcohol use. She reports that she does not use drugs.   Family history   Her family history includes Cancer in her mother; Colon polyps in her brother, brother, brother, brother, brother, and brother; Diabetes in her father; Heart attack in her father; Hypertension in her mother; Macular degeneration in her mother.   Allergies Allergies  Allergen Reactions  . Nsaids Swelling    Hands and legs swell  . Penicillins Hives    Has patient had a PCN reaction causing immediate rash, facial/tongue/throat swelling, SOB or lightheadedness with hypotension: Yes Has patient had a PCN reaction causing severe rash involving mucus membranes or skin necrosis:  No Has patient had a PCN reaction that required hospitalization: No Has patient had a PCN reaction occurring within the last 10 years: No If all of the above answers are "NO", then may proceed with Cephalosporin use.      Home meds  Prior to Admission medications   Medication Sig Start Date End Date Taking? Authorizing Provider  albuterol (PROVENTIL HFA;VENTOLIN HFA) 108 (90 Base) MCG/ACT inhaler Inhale 2 puffs into the lungs every 4 (four) hours as needed for wheezing or shortness of breath.    [provider]  allopurinol (ZYLOPRIM) 100 MG tablet Take 100 mg by mouth daily.    [provider]  amLODipine (NORVASC) 10 MG tablet TAKE 1 TABLET(10 MG) BY MOUTH DAILY 10/28/19   Nahser, Wonda Cheng, MD  apixaban (ELIQUIS) 5 MG TABS tablet Take 1 tablet (5 mg total) by mouth 2 (two) times daily. 10/08/19   Nahser, Wonda Cheng, MD  atorvastatin (LIPITOR) 40 MG tablet Take 1 tablet (40 mg total) by mouth at bedtime. 10/28/19   Nahser, Wonda Cheng, MD  bisoprolol (ZEBETA) 5 MG tablet Take 1 tablet (5 mg total) by mouth daily. 10/08/19   Nahser, Wonda Cheng, MD  Cyanocobalamin (VITAMIN B-12 IJ) Inject 1 mL as directed every 30 (thirty) days.     [provider]  escitalopram (LEXAPRO) 10 MG tablet Take 10 mg by mouth every evening.  03/11/17   [provider]  HYDROcodone-acetaminophen (NORCO/VICODIN) 5-325 MG tablet Take 1 tablet by mouth every 6 (six) hours as needed for moderate pain.    [provider]  insulin NPH-regular Human (NOVOLIN 70/30) (70-30) 100 UNIT/ML injection Inject 35-60 Units into the skin See admin instructions. Inject 35 units into the skin in the morning and 60 units in the evening    [provider]  Insulin Syringe-Needle U-100 (B-D INS SYR ULTRAFINE 1CC/31G) 31G X 5/16" 1 ML MISC by Does not apply route.    [provider]  loratadine (CLARITIN) 10 MG tablet Take 10 mg by mouth daily.    [provider]  metFORMIN  (GLUCOPHAGE) 500 MG tablet Take 1,000-1,500 mg by mouth See admin instructions. Take 1,000 mg by mouth in the morning and 1,500 mg in the evening    [provider]  pantoprazole (PROTONIX) 40 MG tablet Take 1 tablet (40 mg total) by mouth daily. 05/16/16   Ghimire, Henreitta Leber, MD  SPIRIVA HANDIHALER 18 MCG inhalation capsule Place 18 mcg  into inhaler and inhale daily.  03/13/17   [provider]  torsemide (DEMADEX) 20 MG tablet TAKE 1 TABLET(20 MG) BY MOUTH DAILY 03/04/19   Nahser, Wonda Cheng, MD  TRIAMCINOLONE ACETONIDE EX Apply 1 application topically See admin instructions. Apply to both legs daily as directed    [provider]  triamterene-hydrochlorothiazide (MAXZIDE) 75-50 MG tablet Take 1 tablet by mouth daily.    [provider]    Critical care time: 45 min.    Montey Hora, [place] 11/23/2019, 4:04 AM

## 2019-11-23 NOTE — Progress Notes (Signed)
Echocardiogram 2D Echocardiogram has been performed.  [place] Deputy Juda Toepfer 11/23/2019, 11:56 AM

## 2019-11-23 NOTE — ED Provider Notes (Addendum)
[facility]PART[facility] Provider Note   CSN: [id number] Arrival date & time: 11/23/19  0135     History Chief Complaint  Patient presents with  . Abdominal Pain  . Leg Pain    LIANNY MOLTER is a 69 y.o. female with PMH/o COPD, GI bleed, HTN, PAD, HLD brought in by EMS for evaluation of 2 days of BLE cramping, abdominal pain, nausea/vomiting.  She reports that she has not been able to tolerate any p.o. over the last 2 days.  She states that her abdominal pain routinely hurts all over but is sometimes worse in the left side.  No blood noted in the vomit.  She states she has not had any diarrhea.  Her last bowel movement was this morning.  She has not had any black or tarry stools or presence of blood in the stools.  She does have history of A. fib and states she is on Eliquis and states she has been compliant.  Patient reports that she always has pain in her leg secondary to claudication.  She does relate this pain in her legs that she has been experiencing the last 2 days has been slightly worse.  No overlying warmth, erythema.  She does reports that over the last couple days, she has had some chest pain and difficulty breathing.  She states that she currently does not have any chest pain.  She has not noted any fevers.  She called EMS today because her pain was getting worse.  On EMS arrival, patient was hypotensive with systolic blood pressures in 70s.  She received 500 cc of fluid with some bump.  The history is provided by the patient.       Past Medical History:  Diagnosis Date  . Anemia   . COPD (chronic obstructive pulmonary disease) ([place])   . Dyslipidemia   . Exogenous obesity   . GERD (gastroesophageal reflux disease)   . GI bleed 05/14/2016  . History of abnormal cells from cervix   . Hyperlipidemia   . Hypertension   . Iron deficiency anemia   . Longstanding persistent atrial fibrillation ([place])   . Microalbuminuria   . Tobacco dependence     . Type II diabetes mellitus [facility])     Patient Active Problem List   Diagnosis Date Noted  . Acute renal failure (ARF) ([place]) 11/23/2019  . Lactic acidosis   . Hyperkalemia   . Acute encephalopathy   . Sepsis ([place])   . Acute renal failure superimposed on stage 4 chronic kidney disease ([place]) 01/29/2018  . Type II diabetes mellitus with renal manifestations ([place]) 01/29/2018  . GERD (gastroesophageal reflux disease) 01/29/2018  . Depression 01/29/2018  . Tobacco abuse 01/29/2018  . Elevated troponin 01/29/2018  . Leukocytosis 01/29/2018  . Chronic diastolic CHF (congestive heart failure) ([place]) 01/29/2018  . CKD (chronic kidney disease) stage 4, GFR 15-29 ml/min ([facility])   . SOB (shortness of breath)   . PAD (peripheral artery disease) ([place]) 12/23/2017  . Persistent atrial fibrillation 06/10/2016  . GI bleed 05/14/2016  . Symptomatic anemia 05/14/2016  . Diabetes mellitus with complication ([place]) 22/29/7989  . Essential hypertension 05/14/2016  . HLD (hyperlipidemia) 05/14/2016  . Anemia   . Renal insufficiency     Past Surgical History:  Procedure Laterality Date  . CARDIOVERSION N/A 08/07/2016   Procedure: CARDIOVERSION;  Surgeon: Thayer Headings, MD;  Location: [facility];  Service: Cardiovascular;  Laterality: N/A;  . DIAGNOSTIC MAMMOGRAM  05/2015  .  ESOPHAGOGASTRODUODENOSCOPY N/A 05/15/2016   Procedure: ESOPHAGOGASTRODUODENOSCOPY (EGD);  Surgeon: Wonda Horner, MD;  Location: [facility] ENDOSCOPY;  Service: Endoscopy;  Laterality: N/A;  . [place]   "I think they left my appendix"  . PAP SMEAR  2012 AND 2017     OB History   No obstetric history on file.     Family History  Problem Relation Age of Onset  . Cancer Mother        BREAST  . Hypertension Mother   . Macular degeneration Mother   . Heart attack Father   . Diabetes Father   . Colon polyps Brother   . Colon polyps Brother   . Colon polyps Brother   . Colon polyps Brother   . Colon polyps  Brother   . Colon polyps Brother     Social History   Tobacco Use  . Smoking status: Current Every Day Smoker    Packs/day: 0.50    Years: 40.00    Pack years: 20.00    Types: Cigarettes  . Smokeless tobacco: Never Used  Vaping Use  . Vaping Use: Never used  Substance Use Topics  . Alcohol use: Yes    Comment: 05/14/2016 "I'll have a drink once/year; New Year's"  . Drug use: No    Home Medications Prior to Admission medications   Medication Sig Start Date End Date Taking? Authorizing Provider  apixaban (ELIQUIS) 5 MG TABS tablet Take 1 tablet (5 mg total) by mouth 2 (two) times daily. 10/08/19  Yes Nahser, Wonda Cheng, MD  albuterol (PROVENTIL HFA;VENTOLIN HFA) 108 (90 Base) MCG/ACT inhaler Inhale 2 puffs into the lungs every 4 (four) hours as needed for wheezing or shortness of breath.    [provider]  allopurinol (ZYLOPRIM) 100 MG tablet Take 100 mg by mouth daily.    [provider]  amLODipine (NORVASC) 10 MG tablet TAKE 1 TABLET(10 MG) BY MOUTH DAILY 10/28/19   Nahser, Wonda Cheng, MD  atorvastatin (LIPITOR) 40 MG tablet Take 1 tablet (40 mg total) by mouth at bedtime. 10/28/19   Nahser, Wonda Cheng, MD  bisoprolol (ZEBETA) 5 MG tablet Take 1 tablet (5 mg total) by mouth daily. 10/08/19   Nahser, Wonda Cheng, MD  Cyanocobalamin (VITAMIN B-12 IJ) Inject 1 mL as directed every 30 (thirty) days.     [provider]  escitalopram (LEXAPRO) 10 MG tablet Take 10 mg by mouth every evening.  03/11/17   [provider]  HYDROcodone-acetaminophen (NORCO/VICODIN) 5-325 MG tablet Take 1 tablet by mouth every 6 (six) hours as needed for moderate pain.    [provider]  insulin NPH-regular Human (NOVOLIN 70/30) (70-30) 100 UNIT/ML injection Inject 35-60 Units into the skin See admin instructions. Inject 35 units into the skin in the morning and 60 units in the evening    [provider]  Insulin Syringe-Needle U-100 (B-D INS SYR ULTRAFINE 1CC/31G) 31G  X 5/16" 1 ML MISC by Does not apply route.    [provider]  loratadine (CLARITIN) 10 MG tablet Take 10 mg by mouth daily.    [provider]  metFORMIN (GLUCOPHAGE) 500 MG tablet Take 1,000-1,500 mg by mouth See admin instructions. Take 1,000 mg by mouth in the morning and 1,500 mg in the evening    [provider]  pantoprazole (PROTONIX) 40 MG tablet Take 1 tablet (40 mg total) by mouth daily. 05/16/16   Ghimire, Henreitta Leber, MD  SPIRIVA HANDIHALER 18 MCG inhalation capsule Place  18 mcg into inhaler and inhale daily.  03/13/17   [provider]  torsemide (DEMADEX) 20 MG tablet TAKE 1 TABLET(20 MG) BY MOUTH DAILY 03/04/19   Nahser, Wonda Cheng, MD  TRIAMCINOLONE ACETONIDE EX Apply 1 application topically See admin instructions. Apply to both legs daily as directed    [provider]  triamterene-hydrochlorothiazide (MAXZIDE) 75-50 MG tablet Take 1 tablet by mouth daily.    [provider]    Allergies    Nsaids and Penicillins  Review of Systems   Review of Systems  Constitutional: Negative for fever.  Respiratory: Negative for cough and shortness of breath.   Cardiovascular: Negative for chest pain.  Gastrointestinal: Positive for abdominal pain, nausea and vomiting. Negative for blood in stool.  Genitourinary: Negative for dysuria and hematuria.  Musculoskeletal: Positive for myalgias (legs).  Neurological: Negative for headaches.  All other systems reviewed and are negative.   Physical Exam Updated Vital Signs BP (!) 83/69   Pulse (!) 119   Temp (!) 95.2 F (35.1 C) (Rectal)   Resp (!) 24   Ht 5\' 5"  (1.651 m)   Wt (!) 104.3 kg   SpO2 94%   BMI 38.27 kg/m   Physical Exam Vitals and nursing note reviewed.  Constitutional:      Appearance: She is ill-appearing.     Comments: Appears uncomfortable   HENT:     Head: Normocephalic and atraumatic.     Mouth/Throat:     Mouth: Mucous membranes are dry.  Eyes:     General:  Lids are normal.     Conjunctiva/sclera: Conjunctivae normal.     Pupils: Pupils are equal, round, and reactive to light.  Cardiovascular:     Rate and Rhythm: Tachycardia present. Rhythm irregularly irregular.     Pulses:          Radial pulses are 2+ on the right side and 2+ on the left side.     Heart sounds: Normal heart sounds. No murmur heard.  No friction rub. No gallop.      Comments: Unable to palpate bilateral DP, PT  Pulmonary:     Effort: Pulmonary effort is normal. Tachypnea present.     Breath sounds: Normal breath sounds.     Comments: Lungs clear to auscultation bilaterally.  Symmetric chest rise.  No wheezing, rales, rhonchi. Slight tachypnea noted.  Abdominal:     Palpations: Abdomen is soft. Abdomen is not rigid.     Tenderness: There is abdominal tenderness in the left upper quadrant and left lower quadrant. There is no guarding.     Comments: Abdomen is soft, nondistended.  Tenderness in the left upper and left lower abdomen.  No rigidity, guarding.  Musculoskeletal:        General: Normal range of motion.     Cervical back: Full passive range of motion without pain.  Skin:    General: Skin is dry.     Capillary Refill: Capillary refill takes more than 3 seconds.     Coloration: Skin is mottled.     Comments: Bilateral lower extremity with evidence of chronic venous stasis changes.  She does appear to have some skin mottling with some decreased cap refill.  Her extremities are slightly cool to touch.  Neurological:     Mental Status: She is alert and oriented to person, place, and time.  Psychiatric:        Speech: Speech normal.     ED Results / Procedures / Treatments  Labs (all labs ordered are listed, but only abnormal results are displayed) Labs Reviewed  LACTIC ACID, PLASMA - Abnormal; Notable for the following components:      Result Value   Lactic Acid, Venous >11.0 (*)    All other components within normal limits  LACTIC ACID, PLASMA - Abnormal;  Notable for the following components:   Lactic Acid, Venous >11.0 (*)    All other components within normal limits  COMPREHENSIVE METABOLIC PANEL - Abnormal; Notable for the following components:   Sodium 133 (*)    Potassium 5.7 (*)    Chloride 93 (*)    CO2 <7 (*)    Glucose, Bld 381 (*)    BUN 75 (*)    Creatinine, Ser 10.12 (*)    Calcium 7.2 (*)    Total Protein 6.1 (*)    Albumin 3.2 (*)    GFR calc non Af Amer 3 (*)    GFR calc Af Amer 4 (*)    All other components within normal limits  CBC WITH DIFFERENTIAL/PLATELET - Abnormal; Notable for the following components:   WBC 20.1 (*)    RBC 3.22 (*)    Hemoglobin 10.3 (*)    MCV 111.8 (*)    MCHC 28.6 (*)    Neutro Abs 16.8 (*)    Abs Immature Granulocytes 0.57 (*)    All other components within normal limits  APTT - Abnormal; Notable for the following components:   aPTT 43 (*)    All other components within normal limits  PROTIME-INR - Abnormal; Notable for the following components:   Prothrombin Time 23.9 (*)    INR 2.2 (*)    All other components within normal limits  URINALYSIS, ROUTINE W REFLEX MICROSCOPIC - Abnormal; Notable for the following components:   Color, Urine AMBER (*)    APPearance CLOUDY (*)    Hgb urine dipstick LARGE (*)    Protein, ur 30 (*)    Bacteria, UA RARE (*)    Non Squamous Epithelial 0-5 (*)    All other components within normal limits  CBC - Abnormal; Notable for the following components:   WBC 23.3 (*)    RBC 3.01 (*)    Hemoglobin 9.4 (*)    HCT 32.4 (*)    MCV 107.6 (*)    MCHC 29.0 (*)    All other components within normal limits  BASIC METABOLIC PANEL - Abnormal; Notable for the following components:   Chloride 95 (*)    CO2 <7 (*)    Glucose, Bld 289 (*)    BUN 74 (*)    Creatinine, Ser 9.68 (*)    Calcium 7.0 (*)    GFR calc non Af Amer 4 (*)    GFR calc Af Amer 4 (*)    All other components within normal limits  PHOSPHORUS - Abnormal; Notable for the following  components:   Phosphorus <1.0 (*)    All other components within normal limits  I-STAT CHEM 8, ED - Abnormal; Notable for the following components:   Sodium 131 (*)    Potassium 5.4 (*)    BUN 76 (*)    Creatinine, Ser 10.80 (*)    Glucose, Bld 355 (*)    Calcium, Ion 0.89 (*)    TCO2 5 (*)    All other components within normal limits  CBG MONITORING, ED - Abnormal; Notable for the following components:   Glucose-Capillary 353 (*)    All other components within normal  limits  I-STAT VENOUS BLOOD GAS, ED - Abnormal; Notable for the following components:   pH, Ven 6.757 (*)    pCO2, Ven 21.5 (*)    pO2, Ven 115.0 (*)    Bicarbonate 3.0 (*)    TCO2 <5 (*)    Acid-base deficit 30.0 (*)    Sodium 131 (*)    Potassium 5.4 (*)    Calcium, Ion 0.85 (*)    HCT 35.0 (*)    Hemoglobin 11.9 (*)    All other components within normal limits  CBG MONITORING, ED - Abnormal; Notable for the following components:   Glucose-Capillary 291 (*)    All other components within normal limits  TROPONIN I (HIGH SENSITIVITY) - Abnormal; Notable for the following components:   Troponin I (High Sensitivity) 57 (*)    All other components within normal limits  SARS CORONAVIRUS 2 BY RT PCR (HOSPITAL ORDER, [place] [facility])  CULTURE, BLOOD (ROUTINE X 2)  URINE CULTURE  CULTURE, BLOOD (ROUTINE X 2) W REFLEX TO ID PANEL  MAGNESIUM  BLOOD GAS, VENOUS  HIV ANTIBODY (ROUTINE TESTING W REFLEX)  BASIC METABOLIC PANEL  BETA-HYDROXYBUTYRIC ACID  HEPARIN LEVEL (UNFRACTIONATED)  APTT  TROPONIN I (HIGH SENSITIVITY)    EKG EKG Interpretation  Date/Time:  Tuesday November 23 2019 02:04:22 EDT Ventricular Rate:  117 PR Interval:    QRS Duration: 96 QT Interval:  336 QTC Calculation: 469 R Axis:   6 Text Interpretation: Atrial fibrillation Low voltage, precordial leads Repol abnrm suggests ischemia, diffuse leads anterior ST depressions Confirmed by Ezequiel Essex [phone]) on 11/23/2019  2:09:56 AM   Radiology CT Angio Aortobifemoral W and/or Wo Contrast  Result Date: 11/23/2019 CLINICAL DATA:  Left leg pain EXAM: CT ANGIOGRAPHY AOBIFEM WITHOUT AND WITH CONTRAST TECHNIQUE: Arterially timed CTA of the legs was performed after bolus administration of iodinated contrast. CONTRAST:  138mL OMNIPAQUE IOHEXOL 350 MG/ML SOLN COMPARISON:  None. FINDINGS: Left lower extremity: Heavily calcified iliac arteries by irregular plaque. No acute dissection or aneurysm. The iliacs are so heavily diseased that there is no normal segment for baseline diameter measurement. The common femoral is likewise heavily diseased with severe mixed density plaque throughout the SFA causing high-grade stenoses and levels of nonvisualized flow. The profundus femoris is more robustly enhancing, with good opacification of multiple muscular branches/collateral. Diffusely attenuated popliteal artery with generalized atheromatous wall thickening. No popliteal aneurysm or dissection is seen. In the upper calf there is 3 vessel enhancement with flow preferentially in the anterior and posterior tibial arteries. By the ankle, the scan has out paced the bolus. Right lower extremity: Heavily diseased iliac arteries with at least 80% narrowing at the common iliac origin and additional notable flow reducing stenosis of the external iliac just above the inguinal ligament. The SFA is primarily occluded in the thigh, reconstituting near the hiatus with more robust flow in the popliteal artery due to genicular branches. Popliteal artery and upper calf vessels are patent, but the bolus has been out paced by the level of the ankle. No acute osseous finding. There is generalized osteopenia. Generalized subcutaneous reticulation at the level of the calf and ankles. Review of the MIP images confirms the above findings. IMPRESSION: 1. No acute finding. 2. Severe peripheral vascular disease with flow reducing stenoses/chronic occlusions on the right  more than left, as described above. 3. Negative for aneurysm. Electronically Signed   By: Monte Fantasia M.D.   On: 11/23/2019 04:27   DG Chest Portable  1 View  Result Date: 11/23/2019 CLINICAL DATA:  Central catheter placement EXAM: PORTABLE CHEST 1 VIEW COMPARISON:  November 23, 2019 study obtained earlier in the day FINDINGS: Central catheter tip is at the cavoatrial junction. No pneumothorax. There is atelectatic change in the left lower lobe. Lungs elsewhere clear. Heart is enlarged, stable, with pulmonary vascularity normal. No adenopathy. No bone lesions. IMPRESSION: Central catheter tip at cavoatrial junction. No pneumothorax. New atelectatic change left lower lobe region. Lungs elsewhere clear. Stable cardiac prominence. Electronically Signed   By: Lowella Grip III M.D.   On: 11/23/2019 04:57   DG Chest Port 1 View  Result Date: 11/23/2019 CLINICAL DATA:  Shortness of breath for several days EXAM: PORTABLE CHEST 1 VIEW COMPARISON:  01/29/2018 FINDINGS: Cardiac shadow is mildly enlarged. Lungs are well aerated bilaterally. Minimal platelike atelectasis in the right base is noted. No other focal abnormality is seen. IMPRESSION: Minimal right basilar atelectasis. Electronically Signed   By: Inez Catalina M.D.   On: 11/23/2019 02:20   CT Angio Chest/Abd/Pel for Dissection W and/or Wo Contrast  Result Date: 11/23/2019 CLINICAL DATA:  Abdominal pain with aortic dissection suspected. EXAM: CT ANGIOGRAPHY CHEST, ABDOMEN AND PELVIS TECHNIQUE: Non-contrast CT of the chest was initially obtained. Multidetector CT imaging through the chest, abdomen and pelvis was performed using the standard protocol during bolus administration of intravenous contrast. Multiplanar reconstructed images and MIPs were obtained and reviewed to evaluate the vascular anatomy. CONTRAST:  None similar COMPARISON:  None. FINDINGS: CTA CHEST FINDINGS Cardiovascular: No aortic intramural hematoma. Atheromatous calcification of the  aorta and coronaries. No aortic dissection. No major pulmonary artery filling defect. No pericardial effusion. Normal heart size. Atheromatous narrowing of great vessels not measuring over 50%. Mediastinum/Nodes: Negative for thoracic adenopathy. Thyroid nodules measuring up to 2.5 cm in the left lobe. Lungs/Pleura: Small patchy primarily subpleural opacities affecting all lobes. No edema, effusion, or pneumothorax Musculoskeletal: Spondylosis that is generalized and advanced. No acute finding. Review of the MIP images confirms the above findings. CTA ABDOMEN AND PELVIS FINDINGS VASCULAR Aorta: Heavily calcified aorta.  No aneurysm or dissection. Celiac: Atheromatous narrowing of the celiac which is high-grade to the degree that the lumen is difficult to accurately measure. The wall is somewhat indistinct proximally but there is no discrete dissection. SMA: Mild atheromatous plaque at the origin. No flow limiting stenosis, dissection, or major branch occlusion Renals: Single bilateral renal arteries with stenosis on the left measuring approximately 70% and on the right measuring approximately 50%. No superimposed dissection or aneurysm. IMA: Patent Inflow: Reported separately Veins: Negative in the arterial phase Review of the MIP images confirms the above findings. NON-VASCULAR Hepatobiliary: No focal liver abnormality.No evidence of biliary obstruction or stone. Pancreas: Unremarkable. Spleen: Unremarkable. Adrenals/Urinary Tract: Negative adrenals. No hydronephrosis or stone. Unremarkable bladder, which is collapsed by a Foley catheter. Stomach/Bowel:  No obstruction. No visible bowel inflammation Lymphatic: No mass or adenopathy. Reproductive:Negative Other: No ascites or pneumoperitoneum. Musculoskeletal: Generalized advanced lumbar spine degenerative disease. No acute or aggressive finding. Review of the MIP images confirms the above findings. IMPRESSION: 1. No evidence of acute aortic syndrome. 2. Patchy  subpleural airspace disease most commonly from atypical or inflammatory pneumonia. Please correlate with COVID-19 status. 3. 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 4. 2.5 cm left thyroid nodule. Recommend thyroid US.(ref: J Am Coll Radiol. 2015 Feb;12(2): 143-50). Aortic Atherosclerosis (ICD10-I70.0). Electronically Signed   By: Monte Fantasia M.D.   On: 11/23/2019 04:15  Procedures .Critical Care Performed by: Volanda Napoleon, PA-C Authorized by: Volanda Napoleon, PA-C   Critical care provider statement:    Critical care time (minutes):  45   Critical care was necessary to treat or prevent imminent or life-threatening deterioration of the following conditions:  Sepsis and metabolic crisis   Critical care was time spent personally by me on the following activities:  Discussions with consultants, evaluation of patient's response to treatment, examination of patient, ordering and performing treatments and interventions, ordering and review of laboratory studies, ordering and review of radiographic studies, pulse oximetry, re-evaluation of patient's condition, obtaining history from patient or surrogate and review of old charts   (including critical care time)  Medications Ordered in ED Medications  insulin regular, human (MYXREDLIN) 100 units/ 100 mL infusion (7.5 Units/hr Intravenous Rate/Dose Change 11/23/19 0529)  0.9 %  sodium chloride infusion ( Intravenous Rate/Dose Change 11/23/19 0545)  dextrose 5 %-0.45 % sodium chloride infusion ( Intravenous New Bag/Given 11/23/19 0534)  dextrose 50 % solution 0-50 mL (has no administration in time range)  vancomycin (VANCOREADY) IVPB 2000 mg/400 mL (2,000 mg Intravenous New Bag/Given 11/23/19 0446)  vancomycin variable dose per unstable renal function (pharmacist dosing) (has no administration in time range)  ceFEPIme (MAXIPIME) 1 g in sodium chloride 0.9 % 100 mL IVPB (has no administration in time  range)  sodium bicarbonate 150 mEq in sterile water 1,000 mL infusion ( Intravenous New Bag/Given 11/23/19 0344)  docusate sodium (COLACE) capsule 100 mg (has no administration in time range)  polyethylene glycol (MIRALAX / GLYCOLAX) packet 17 g (has no administration in time range)  ipratropium-albuterol (DUONEB) 0.5-2.5 (3) MG/3ML nebulizer solution 3 mL (3 mLs Nebulization Given 11/23/19 0452)  albuterol (PROVENTIL) (2.5 MG/3ML) 0.083% nebulizer solution 2.5 mg (has no administration in time range)  heparin ADULT infusion 100 units/mL (25000 units/27mL sodium chloride 0.45%) (has no administration in time range)  sodium chloride 0.9 % bolus 1,000 mL (has no administration in time range)  norepinephrine (LEVOPHED) 4mg  in 289mL premix infusion (3 mcg/min Intravenous Rate/Dose Change 11/23/19 0618)  sodium chloride 0.9 % bolus 1,000 mL (0 mLs Intravenous Stopped 11/23/19 0555)  ceFEPIme (MAXIPIME) 2 g in sodium chloride 0.9 % 100 mL IVPB (0 g Intravenous Stopped 11/23/19 0518)  sodium bicarbonate injection 50 mEq (50 mEq Intravenous Given 11/23/19 0307)  calcium gluconate 1 g/ 50 mL sodium chloride IVPB (0 g Intravenous Stopped 11/23/19 0444)  calcium gluconate 1 g/ 50 mL sodium chloride IVPB (0 g Intravenous Stopped 11/23/19 0555)  sodium bicarbonate injection 50 mEq (50 mEq Intravenous Given 11/23/19 0340)  iohexol (OMNIPAQUE) 350 MG/ML injection 100 mL (100 mLs Intravenous Contrast Given 11/23/19 0402)    ED Course  I have reviewed the triage vital signs and the nursing notes.  Pertinent labs & imaging results that were available during my care of the patient were reviewed by me and considered in my medical decision making (see chart for details).    MDM Rules/Calculators/A&P                          69 year old female past medical story of A. fib (on Eliquis), hypertension, hyper lipidemia, diabetes, stage IV kidney disease brought in by EMS for evaluation of 2 days of abdominal pain,  nausea/vomiting, pain to her bilateral lower extremities.  She has history of PAD and always has some bilateral lower extremity cramping.  Feels like her last 2 days has been worse.  She has not been able to tolerate p.o. over the last 2 days.  No blood noted in emesis.  No blood with bowel movements.  On initial ED arrival, she was hypotensive with blood pressure of 88/50.  She is tachycardic.  On exam, she has tenderness palpation in the left mid and left lower abdomen.  On exam, her extremities appear mottled and are slightly cool to touch.  Unable to palpate DP and PT pulse.  Attempted to obtain Doppler pulses but could not find either PT or DP pulse.  Concern for dehydration versus DKA given history of diabetes versus vascular versus ACS etiology.  Also concern for dissection versus ischemic limb versus mesenteric ischemia.  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  She also has extensive PAD.  VBG shows pH of 6.757, bicarb of 3.  Lactic is greater than 11.  CBC shows leukocytosis of 20.1.  Hemoglobin is 10.3.  CMP shows BUN of 75, creatinine of 10.12.  Potassium is 5.7, bicarb is 5.  This AKI is new.  She has history of chronic kidney disease, stage IV but her creatinine at baseline is around 2.4.  At this time, both myself and ED attending are unable to obtain Doppler pulses of her bilateral lower extremities.  Concern at this time is for dissection versus mesenteric ischemia/ischemic bowel.  At this time, feel that imaging study with contrast is warranted.  Discussed patient with Dr. Karin Golden (Radiology) who recommends obtaining imaging with contrast given our clinical suspicion as a CT imaging without contrast would not be useful in this scenario.  Discussed patient with Dr. Royce Macadamia (Nephrology) who recommends obtaining imaging as needed to rule out ischemic causes of patient's etiology.  She recommends putting patient on bicarb drip at  150 mEq at a rate of 125/h and 2 A of bicarb in sterile water.  Also recommends giving Exam gluconate 1 g IV x2.  She will plan to consult as needed.  I discussed with patient regarding her work-up here in the ED and the critical nature of her results.  Particularly, I discussed with patient regarding her kidney function.  Explained to patient my concern for critical bowel ischemia versus dissection versus other vascular pathology that is causing her symptoms and that the best way to evaluate that would be imaging with contrast.  I informed patient that given her current renal failure status right now, doing a CT scan with contrast could further injure her kidneys. I explained the risk versus benefits of obtaining imaging, including but not limited to renal failure, permanent dialysis, worsening condition.  I also discussed with patient that without CT scan, it might not be possible to diagnose what is going on.  I had patient verbalized the information that I had told her back to me and she was able to tell me that we were concerned about something in her stomach and legs that was causing her to be sick and that her kidneys were in failure.  She was able to tell me that by doing any imaging, it might cause her kidneys to be permanently in failure and might require dialysis.  Nurse Moses Manners was there to witness this conversation.  Patient then gave consent to go ahead and proceed with imaging understanding the risk first benefits.  Dr. Wyvonnia Dusky discussed with critical care who will come evaluate patient in the ED.  CTA shows no evidence of acute dissection or abdominal abnormality.  She does have extensive arterial disease with areas of chronic flow obstruction.   Patient will be admitted to critical care.   Discussed patient with Dr. Oneida Alar (Vascular) who will plan to consult on patient during admission.   Portions of this note were generated with Lobbyist. Dictation errors may  occur despite best attempts at proofreading.   Final Clinical Impression(s) / ED Diagnoses Final diagnoses:  Metabolic acidosis  Sepsis, due to unspecified organism, unspecified whether acute organ dysfunction present [facility])    Rx / DC Orders ED Discharge Orders    None       Volanda Napoleon, PA-C 11/23/19 0542    Volanda Napoleon, PA-C 11/23/19 4503    Ezequiel Essex, MD 11/23/19 [phone]

## 2019-11-23 NOTE — ED Notes (Signed)
Dr. Ronnette Juniper w/ [facility] verified that heparin is to be held.

## 2019-11-23 NOTE — ED Notes (Signed)
Provider instructed RN to hold heparin until further instructed to give.

## 2019-11-23 NOTE — ED Notes (Signed)
CBG 220. 

## 2019-11-23 NOTE — ED Notes (Signed)
ED RN and provider unable to locate pedal pulses.

## 2019-11-24 ENCOUNTER — Inpatient Hospital Stay (HOSPITAL_COMMUNITY): Payer: BC Managed Care – PPO

## 2019-11-24 DIAGNOSIS — R6521 Severe sepsis with septic shock: Secondary | ICD-10-CM | POA: Diagnosis not present

## 2019-11-24 DIAGNOSIS — N179 Acute kidney failure, unspecified: Secondary | ICD-10-CM

## 2019-11-24 DIAGNOSIS — E872 Acidosis: Secondary | ICD-10-CM | POA: Diagnosis not present

## 2019-11-24 DIAGNOSIS — A419 Sepsis, unspecified organism: Principal | ICD-10-CM

## 2019-11-24 DIAGNOSIS — I739 Peripheral vascular disease, unspecified: Secondary | ICD-10-CM

## 2019-11-24 LAB — RENAL FUNCTION PANEL
Albumin: 3.2 g/dL — ABNORMAL LOW (ref 3.5–5.0)
Albumin: 3.1 g/dL — ABNORMAL LOW (ref 3.5–5.0)
Anion gap: 18 — ABNORMAL HIGH (ref 5–15)
Anion gap: 15 (ref 5–15)
BUN: 31 mg/dL — ABNORMAL HIGH (ref 8–23)
BUN: 18 mg/dL (ref 8–23)
CO2: 18 mmol/L — ABNORMAL LOW (ref 22–32)
CO2: 20 mmol/L — ABNORMAL LOW (ref 22–32)
Calcium: 8 mg/dL — ABNORMAL LOW (ref 8.9–10.3)
Calcium: 8.1 mg/dL — ABNORMAL LOW (ref 8.9–10.3)
Chloride: 99 mmol/L (ref 98–111)
Chloride: 100 mmol/L (ref 98–111)
Creatinine, Ser: 3.58 mg/dL — ABNORMAL HIGH (ref 0.44–1.00)
Creatinine, Ser: 2.33 mg/dL — ABNORMAL HIGH (ref 0.44–1.00)
GFR calc Af Amer: 14 mL/min — ABNORMAL LOW (ref >60)
GFR calc Af Amer: 24 mL/min — ABNORMAL LOW (ref >60)
GFR calc non Af Amer: 12 mL/min — ABNORMAL LOW (ref >60)
GFR calc non Af Amer: 21 mL/min — ABNORMAL LOW (ref >60)
Glucose, Bld: 148 mg/dL — ABNORMAL HIGH (ref 70–99)
Glucose, Bld: 191 mg/dL — ABNORMAL HIGH (ref 70–99)
Phosphorus: 3.7 mg/dL (ref 2.5–4.6)
Phosphorus: 2.8 mg/dL (ref 2.5–4.6)
Potassium: 5 mmol/L (ref 3.5–5.1)
Potassium: 4.5 mmol/L (ref 3.5–5.1)
Sodium: 135 mmol/L (ref 135–145)
Sodium: 135 mmol/L (ref 135–145)

## 2019-11-24 LAB — CBC
HCT: 27 % — ABNORMAL LOW (ref 36.0–46.0)
Hemoglobin: 9.1 g/dL — ABNORMAL LOW (ref 12.0–15.0)
MCH: 31.8 pg (ref 26.0–34.0)
MCHC: 33.7 g/dL (ref 30.0–36.0)
MCV: 94.4 fL (ref 80.0–100.0)
Platelets: 196 10*3/uL (ref 150–400)
RBC: 2.86 MIL/uL — ABNORMAL LOW (ref 3.87–5.11)
RDW: 14.1 % (ref 11.5–15.5)
WBC: 17.1 10*3/uL — ABNORMAL HIGH (ref 4.0–10.5)
nRBC: 0.2 % (ref 0.0–0.2)

## 2019-11-24 LAB — GLUCOSE, CAPILLARY
Glucose-Capillary: 101 mg/dL — ABNORMAL HIGH (ref 70–99)
Glucose-Capillary: 107 mg/dL — ABNORMAL HIGH (ref 70–99)
Glucose-Capillary: 113 mg/dL — ABNORMAL HIGH (ref 70–99)
Glucose-Capillary: 118 mg/dL — ABNORMAL HIGH (ref 70–99)
Glucose-Capillary: 132 mg/dL — ABNORMAL HIGH (ref 70–99)
Glucose-Capillary: 140 mg/dL — ABNORMAL HIGH (ref 70–99)
Glucose-Capillary: 149 mg/dL — ABNORMAL HIGH (ref 70–99)
Glucose-Capillary: 149 mg/dL — ABNORMAL HIGH (ref 70–99)
Glucose-Capillary: 154 mg/dL — ABNORMAL HIGH (ref 70–99)
Glucose-Capillary: 155 mg/dL — ABNORMAL HIGH (ref 70–99)
Glucose-Capillary: 157 mg/dL — ABNORMAL HIGH (ref 70–99)
Glucose-Capillary: 159 mg/dL — ABNORMAL HIGH (ref 70–99)
Glucose-Capillary: 160 mg/dL — ABNORMAL HIGH (ref 70–99)
Glucose-Capillary: 163 mg/dL — ABNORMAL HIGH (ref 70–99)
Glucose-Capillary: 163 mg/dL — ABNORMAL HIGH (ref 70–99)
Glucose-Capillary: 166 mg/dL — ABNORMAL HIGH (ref 70–99)
Glucose-Capillary: 166 mg/dL — ABNORMAL HIGH (ref 70–99)
Glucose-Capillary: 171 mg/dL — ABNORMAL HIGH (ref 70–99)
Glucose-Capillary: 173 mg/dL — ABNORMAL HIGH (ref 70–99)
Glucose-Capillary: 177 mg/dL — ABNORMAL HIGH (ref 70–99)
Glucose-Capillary: 179 mg/dL — ABNORMAL HIGH (ref 70–99)
Glucose-Capillary: 181 mg/dL — ABNORMAL HIGH (ref 70–99)
Glucose-Capillary: 188 mg/dL — ABNORMAL HIGH (ref 70–99)
Glucose-Capillary: 190 mg/dL — ABNORMAL HIGH (ref 70–99)
Glucose-Capillary: 69 mg/dL — ABNORMAL LOW (ref 70–99)
Glucose-Capillary: 90 mg/dL (ref 70–99)

## 2019-11-24 LAB — URINE CULTURE: Culture: NO GROWTH

## 2019-11-24 LAB — BETA-HYDROXYBUTYRIC ACID
Beta-Hydroxybutyric Acid: 2.98 mmol/L — ABNORMAL HIGH (ref 0.05–0.27)
Beta-Hydroxybutyric Acid: 1.96 mmol/L — ABNORMAL HIGH (ref 0.05–0.27)
Beta-Hydroxybutyric Acid: 2.17 mmol/L — ABNORMAL HIGH (ref 0.05–0.27)

## 2019-11-24 LAB — LACTIC ACID, PLASMA
Lactic Acid, Venous: 6.6 mmol/L (ref 0.5–1.9)
Lactic Acid, Venous: 3.9 mmol/L (ref 0.5–1.9)

## 2019-11-24 LAB — BASIC METABOLIC PANEL
Anion gap: 15 (ref 5–15)
BUN: 24 mg/dL — ABNORMAL HIGH (ref 8–23)
CO2: 19 mmol/L — ABNORMAL LOW (ref 22–32)
Calcium: 8 mg/dL — ABNORMAL LOW (ref 8.9–10.3)
Chloride: 100 mmol/L (ref 98–111)
Creatinine, Ser: 2.99 mg/dL — ABNORMAL HIGH (ref 0.44–1.00)
GFR calc Af Amer: 18 mL/min — ABNORMAL LOW (ref >60)
GFR calc non Af Amer: 15 mL/min — ABNORMAL LOW (ref >60)
Glucose, Bld: 160 mg/dL — ABNORMAL HIGH (ref 70–99)
Potassium: 4.5 mmol/L (ref 3.5–5.1)
Sodium: 134 mmol/L — ABNORMAL LOW (ref 135–145)

## 2019-11-24 LAB — HEPARIN LEVEL (UNFRACTIONATED): Heparin Unfractionated: 0.92 IU/mL — ABNORMAL HIGH (ref 0.30–0.70)

## 2019-11-24 LAB — MAGNESIUM: Magnesium: 2.1 mg/dL (ref 1.7–2.4)

## 2019-11-24 LAB — APTT
aPTT: 122 seconds — ABNORMAL HIGH (ref 24–36)
aPTT: 138 seconds — ABNORMAL HIGH (ref 24–36)

## 2019-11-24 MED ORDER — B COMPLEX-C PO TABS
1.0000 | ORAL_TABLET | Freq: Every day | ORAL | Status: DC
  Administered 2019-11-24 – 2019-11-29 (×6): 1 via ORAL
  Filled 2019-11-24 (×6): qty 1

## 2019-11-24 MED ORDER — HYDROMORPHONE HCL 1 MG/ML IJ SOLN
0.2000 mg | INTRAMUSCULAR | Status: DC | PRN
  Administered 2019-11-24 (×3): 0.2 mg via INTRAVENOUS
  Filled 2019-11-24 (×3): qty 0.5

## 2019-11-24 MED ORDER — HEPARIN (PORCINE) 25000 UT/250ML-% IV SOLN
1400.0000 [IU]/h | INTRAVENOUS | Status: DC
  Administered 2019-11-25 – 2019-11-27 (×2): 800 [IU]/h via INTRAVENOUS
  Administered 2019-11-28: 1150 [IU]/h via INTRAVENOUS
  Filled 2019-11-24 (×4): qty 250

## 2019-11-24 MED ORDER — MORPHINE SULFATE (PF) 2 MG/ML IV SOLN
1.0000 mg | Freq: Once | INTRAVENOUS | Status: AC | PRN
  Administered 2019-11-24: 1 mg via INTRAVENOUS
  Filled 2019-11-24: qty 1

## 2019-11-24 MED ORDER — IPRATROPIUM-ALBUTEROL 0.5-2.5 (3) MG/3ML IN SOLN
3.0000 mL | Freq: Three times a day (TID) | RESPIRATORY_TRACT | Status: DC
  Administered 2019-11-24 – 2019-11-26 (×9): 3 mL via RESPIRATORY_TRACT
  Filled 2019-11-24 (×10): qty 3

## 2019-11-24 MED ORDER — ENSURE ENLIVE PO LIQD
237.0000 mL | Freq: Two times a day (BID) | ORAL | Status: DC
  Administered 2019-11-24 – 2019-11-29 (×6): 237 mL via ORAL

## 2019-11-24 MED ORDER — HYDROCODONE-ACETAMINOPHEN 5-325 MG PO TABS
1.0000 | ORAL_TABLET | Freq: Once | ORAL | Status: AC
  Administered 2019-11-24: 1 via ORAL
  Filled 2019-11-24: qty 1

## 2019-11-24 MED ORDER — PHENOL 1.4 % MT LIQD
1.0000 | OROMUCOSAL | Status: DC | PRN
  Administered 2019-11-24: 1 via OROMUCOSAL
  Filled 2019-11-24: qty 177

## 2019-11-24 MED ORDER — MAGNESIUM SULFATE IN D5W 1-5 GM/100ML-% IV SOLN
1.0000 g | Freq: Once | INTRAVENOUS | Status: AC
  Administered 2019-11-24: 1 g via INTRAVENOUS
  Filled 2019-11-24: qty 100

## 2019-11-24 MED ORDER — SODIUM CHLORIDE 0.9% FLUSH
10.0000 mL | INTRAVENOUS | Status: DC | PRN
  Administered 2019-11-28: 10 mL

## 2019-11-24 MED ORDER — SODIUM CHLORIDE 0.9% FLUSH
10.0000 mL | Freq: Two times a day (BID) | INTRAVENOUS | Status: DC
  Administered 2019-11-24 – 2019-11-29 (×4): 10 mL

## 2019-11-24 MED ORDER — CALCIUM GLUCONATE-NACL 1-0.675 GM/50ML-% IV SOLN
1.0000 g | Freq: Once | INTRAVENOUS | Status: AC
  Administered 2019-11-24: 1000 mg via INTRAVENOUS
  Filled 2019-11-24: qty 50

## 2019-11-24 NOTE — Progress Notes (Signed)
[place] Progress Note Patient Name: Paula Wheeler DOB: February 08, 1951 MRN: [id number]   Date of Service  11/24/2019  HPI/Events of Note  Pt has PAD with chronic wounds on her legs and has chronic pain. Please review her home meds and consider reordering  > triamcinolone cream and  vicodin  eICU Interventions  - obese, on 2 lit o2. Alert. Home meds seen. Avoid steroid cream due to high sugars - Vicodine once ordered. Has prn Dilaudid already. Watch for Narcotic OD, hypercarbia-confusion. -asp precautions  Discussed with bed side RN.      Intervention Category Intermediate Interventions: Pain - evaluation and management Minor Interventions: Communication with other healthcare providers and/or family  Elmer Sow 11/24/2019, 9:15 PM

## 2019-11-24 NOTE — Procedures (Signed)
Admit: 11/23/2019 LOS: 1  53F CKD4 with severe lactic metabolic acidosis, hyeprkalemia, AoCKD  Current CRRT Prescription: Start Date: 7/27 Catheter: R IJ Temp placed 7/27 in ED BFR: 200 Pre Blood Pump: 400 4K DFR: 2000 4K Replacement Rate: 300 4K Goal UF: Net even Anticoagulation: systemic heparin Clotting: none  S:  BHB and lactate improving  Tolerating CRRT  Off pressors  No UOP  O: 07/27 0701 - 07/28 0700 In: 3784.8 [I.V.:3458.3; IV Piggyback:326.6] Out: 2293 [Urine:252]  Filed Weights   11/23/19 0242 11/23/19 1200 11/24/19 0414  Weight: (!) 104.3 kg (!) 99 kg (!) 99.3 kg    Recent Labs  Lab 11/23/19 0939 11/23/19 0939 11/23/19 1605 11/23/19 1605 11/23/19 1647 11/24/19 0436 11/24/19 0817  NA 136   < > 134*   < > 133* 135 134*  K 4.6   < > 4.4   < > 4.3 5.0 4.5  CL 96*   < > 97*   < > 98 99 100  CO2 8*   < > 13*   < > 14* 18* 19*  GLUCOSE 171*   < > 131*   < > 132* 148* 160*  BUN 74*   < > 54*   < > 51* 31* 24*  CREATININE 9.27*   < > 6.37*   < > 5.90* 3.58* 2.99*  CALCIUM 7.3*   < > 7.3*   < > 7.3* 8.0* 8.0*  PHOS 10.3*  --  6.5*  --   --  3.7  --    < > = values in this interval not displayed.   Recent Labs  Lab 11/23/19 0147 11/23/19 0203 11/23/19 0218 11/23/19 0444 11/24/19 0817  WBC 20.1*  --   --  23.3* 17.1*  NEUTROABS 16.8*  --   --   --   --   HGB 10.3*   < > 11.9* 9.4* 9.1*  HCT 36.0   < > 35.0* 32.4* 27.0*  MCV 111.8*  --   --  107.6* 94.4  PLT 302  --   --  258 196   < > = values in this interval not displayed.    Scheduled Meds: . Chlorhexidine Gluconate Cloth  6 each Topical Daily  . ipratropium-albuterol  3 mL Nebulization TID  . sodium chloride flush  10-40 mL Intracatheter Q12H   Continuous Infusions: .  prismasol BGK 4/2.5 400 mL/hr at 11/24/19 1145  .  prismasol BGK 4/2.5 300 mL/hr at 11/24/19 0312  . sodium chloride Stopped (11/23/19 1628)  . sodium chloride Stopped (11/24/19 8295)  . ceFEPime (MAXIPIME) IV Stopped  (11/24/19 1043)  . dextrose 5 % and 0.45% NaCl 75 mL/hr at 11/24/19 1100  . heparin 1,050 Units/hr (11/24/19 1100)  . insulin 0.4 mL/hr at 11/24/19 1100  . magnesium sulfate bolus IVPB 1 g (11/24/19 1126)  . norepinephrine (LEVOPHED) Adult infusion Stopped (11/23/19 1303)  . prismasol BGK 4/2.5 2,000 mL/hr (11/24/19 6213)  . vancomycin Stopped (11/24/19 0708)   PRN Meds:.sodium chloride, albuterol, dextrose, docusate sodium, heparin, HYDROmorphone (DILAUDID) injection, polyethylene glycol, sodium chloride flush  ABG    Component Value Date/Time   HCO3 12.8 (L) 11/23/2019 1605   TCO2 <5 (L) 11/23/2019 0218   ACIDBASEDEF 14.0 (H) 11/23/2019 1605   O2SAT 49.1 11/23/2019 1605    A/P  1. Anuric dialysis dependent AoCKD4; possibly new ESRD 2. Shock, improved off pressors 3. PNA on Vanc/Cefepime per CCM 4. Severe AG metabolic acidosis, inc lactate +BHB: ? DKA ? Metformin  toxicity; on metformin at admit 5. Hyperkalemia, improved on all 4K 6. Hyperphosphatemia, trend on CRRT, 3.7 this AM 7. DM2, hyperglycemia, per CCM 8. Severe PVD 9. COPD  Cont CRRT at current settings. Trend labs.  Suspect will req prolonged / indefinite dialysis dependence.  UF up to 60mL/h neg net today.  Possibly off CRRT tomorrow to iHD/monitoring for GFR recovery  Pearson Grippe, MD [facility]

## 2019-11-24 NOTE — Progress Notes (Signed)
NAME:  Paula Wheeler, MRN:  [id number], DOB:  April 27, 1951, LOS: 1 ADMISSION DATE:  11/23/2019, CONSULTATION DATE:  11/23/19 REFERRING MD:  Wyvonnia Dusky  CHIEF COMPLAINT:  Abd pain   Brief History   Paula Wheeler is a 69 y.o. female who was admitted 7/27 with sepsis of unclear etiology, significant AoCKD, and multiple metabolic derangements.  Possible component DKA.  History of present illness    Paula Wheeler is a 69 y.o. female who has a PMH as outlined below.  She presented to [facility] ED 7/27 with generalized abdominal pain, LE pain, nausea/vomiting. She had not been able to tolerate any PO for 2 days.  Abdominal pain is somewhat chronic but recently worse mainly over left lower side.  LE pain also chronic due to claudication, but worse over past 2 days  In ED, she was found to be hypotensive and had multiple metabolic derangements including significant AoCKD and profound lactic acidosis.  There is also some concern for DKA.  CTA abd / pelv and CTA aorto bifem pending.    Past Medical History  has GI bleed; Symptomatic anemia; Diabetes mellitus with complication ([place]); Essential hypertension; HLD (hyperlipidemia); Anemia; Renal insufficiency; Persistent atrial fibrillation; PAD (peripheral artery disease) ([place]); Acute renal failure superimposed on stage 4 chronic kidney disease ([place]); Type II diabetes mellitus with renal manifestations ([place]); GERD (gastroesophageal reflux disease); Depression; Tobacco abuse; CKD (chronic kidney disease) stage 4, GFR 15-29 ml/min (HCC); SOB (shortness of breath); Elevated troponin; Leukocytosis; Chronic diastolic CHF (congestive heart failure) ([place]); Acute renal failure (ARF) ([place]); Lactic acidosis; Hyperkalemia; Acute encephalopathy; Sepsis ([place]); and Shock circulatory ([place]) on their problem list.  Significant Hospital Events   7/27 > admit.CRRT  Consults:  Nephrology.  Procedures:  HD cath pending 7/27 >   Significant Diagnostic Tests:  CTA abd / pelv  7/27 > Patchy subpleural airspace disease most commonly from atypical or inflammatory pneumonia.  Extensive atherosclerosis with high-grade narrowing of the celiac and left renal artery CTA aorto bifem 7/27 > severe PAD,with flow reducing stenoses/chronic occlusions on the right more than left  Micro Data:  COVID 7/27 > neg Blood 7/27 >  Urine 7/27 >   Antimicrobials:  Vanc 7/27 >  Cefepime 7/27 >    Interim history/subjective:   Critically ill, on CRRT Off pressors Afebrile Anuric Denies abdominal pain  Objective:  Blood pressure (!) 103/52, pulse (!) 106, temperature 97.7 F (36.5 C), temperature source Oral, resp. rate (!) 27, height 5\' 5"  (1.651 m), weight (!) 99.3 kg, SpO2 94 %.        Intake/Output Summary (Last 24 hours) at 11/24/2019 0904 Last data filed at 11/24/2019 0800 Gross per 24 hour  Intake 3871.38 ml  Output 2397 ml  Net 1474.38 ml   Filed Weights   11/23/19 0242 11/23/19 1200 11/24/19 0414  Weight: (!) 104.3 kg (!) 99 kg (!) 99.3 kg    Examination: General: Adult female, critically ill. Neuro: Alert, interactive, no asterixis HEENT: [place]/AT. Sclerae anicteric.  EOMI. Cardiovascular: IRIR, no M/R/G.  Lungs: No accessory muscle use decreased breath sounds bilateral Abdomen: BS x 4, soft, Tender to deep palpation. Musculoskeletal: No gross deformities, no edema.  Skin: LE slightly mottled.  Warm to mid calf, blood blisters on left, SCDs removed  Chest x-ray 7/28 personally reviewed left lower lobe infiltrate, cardiomegaly  Labs show decreasing lactate to 6.6, mild hyperkalemia, beta hydroxy decreased to 2.98, sugars 118-159 range  Assessment & Plan:  No evidence of lumbar bowel ischemia  most likely etiology seems to be Metformin induced lactic acidosis in the setting of CKD, empirically being treated for sepsis/left lower lobe pneumonia   Septic shock -improved?  Left lower lobe pneumonia - Continue empiric cefepime and vancomycin - Follow  cultures.  Profound lactic acidosis - exacerbated by metformin, lactate resolving AKI on CKD. -Continue CRRT, renal following -Hopeful for renal recovery here -DC bicarbonate drip   Possible DKA. Hx DM2. - Continue insulin gtt empirically. -Start diet and if CBGs go about 200 can DC D5 fluids -Follow-up beta hydroxybutyrate every 8 and once this normalizes can discontinue insulin drip - Hold home novolin, metformin.  Abdominal pain and LE pain -no evidence of ischemic bowel or ischemic limb although has severe PAD, DC SCDs due to blisters -Vascular following   Hx a.fib, HTN, HLD. - Heparin gtt in lieu of home eliquis. - Hold home amlodipine, atorvastatin, bisoprolol, torsemide.  Hx tobacco dependence, possible COPD (no PFT's). - BD's. - Hold home spiriva. - Tobacco cessation counseling.  Best Practice:  Diet: NPO. Pain/Anxiety/Delirium protocol (if indicated): N/A. VAP protocol (if indicated): N/A. DVT prophylaxis: SCD's / Heparin. GI prophylaxis: N/A. Glucose control: Insulin gtt. Mobility: Bedrest. Code Status: Full. Family Communication: Brother  Disposition: ICU.   The patient is critically ill with multiple organ systems failure and requires high complexity decision making for assessment and support, frequent evaluation and titration of therapies, application of advanced monitoring technologies and extensive interpretation of multiple databases. Critical Care Time devoted to patient care services described in this note independent of APP/resident  time is 35 minutes.    Kara Mead MD. [place] [place]. [facility]  If no response to pager , please call [phone]   11/24/2019

## 2019-11-24 NOTE — Progress Notes (Signed)
[place] for heparin Indication: atrial fibrillation  Assessment: 69 yo female with afib to start heparin while apixaban on hold (last dose 7/26 at ~ 8pm). She is admitted with DKA and on CRRT.   APTT this afternoon still elevated at 138 sec even after adjustments early this morning. Heparin level 0.92 elevated due to recent apixaban.   Goal of Therapy:  Heparin level 0.3-0.7 units/ml aPTT 66-102 seconds Monitor platelets by anticoagulation protocol: Yes   Plan:  Hold heparin 30 minutes then decrease heparin to 800 units/hr -check aPTT in 8 hours  Thanks for allowing pharmacy to be a part of this patient's care.  Erin Hearing PharmD., BCPS Clinical Pharmacist 11/24/2019 1:56 PM

## 2019-11-24 NOTE — Consult Note (Addendum)
[facility] Nurse Consult Note: Reason for Consult: Consult requested for bilat legs. Pt developed dark purple irregular shaped blood blisters yesterday, according to the bedside nurse. Pt is critically ill in ICU with multiple systemic factors which can impair healing.  Right posterior calf 5X2cm, Right anterior calf 6X4cm and 5X4cm. Appearance and location is NOT consistent with pressure; appearance is consistent with possible vasculitis process.  Pt has a history of PVD. Intact skin; currently no open wounds or drainage.  Wounds are high risk to evolve into full thickness skin loss once they rupture.  Dressing procedure/placement/frequency: Float heels to reduce pressure in Prevalon boots. Topical treatment orders provided for bedside nurses to perform as follows: Apply double-folded xeroform gauze to bilat leg wounds Q day, then recover with foam dressing.  (Change foam dressing Q 3 days or PRN soiling.) Please re-consult if further assistance is needed.  Thank-you,  Julien Girt MSN, [place], [place], [place], [place]

## 2019-11-24 NOTE — Progress Notes (Signed)
[place] Progress Note Patient Name: Paula Wheeler DOB: November 02, 1950 MRN: [id number]   Date of Service  11/24/2019  HPI/Events of Note  Notified of calcium 7.3 and with leg cramps  eICU Interventions  Corrected calcium 8 Ordered calcium gluconate 1 g        Dezzie Badilla T Rasean Joos 11/24/2019, 1:41 AM

## 2019-11-24 NOTE — Progress Notes (Signed)
Inpatient Diabetes Program Recommendations  AACE/ADA: New Consensus Statement on Inpatient Glycemic Control (2015)  Target Ranges:  Prepandial:   less than 140 mg/dL      Peak postprandial:   less than 180 mg/dL (1-2 hours)      Critically ill patients:  140 - 180 mg/dL   Lab Results  Component Value Date   GLUCAP 101 (H) 11/24/2019   HGBA1C 7.1 (H) 11/23/2019    Review of Glycemic Control Results for Paula Wheeler, Paula Wheeler (MRN [id number]) as of 11/24/2019 13:11  Ref. Range 11/24/2019 09:10 11/24/2019 10:05 11/24/2019 11:05 11/24/2019 11:16  Glucose-Capillary Latest Ref Range: 70 - 99 mg/dL 149 (H) 154 (H) 171 (H) 101 (H)   Diabetes history: DM 2 Outpatient Diabetes medications:  Novolin 70/30 30 units in the AM and 70 units in the PM Metformin 1000 mg in the AM and 1500 mg in the PM Current orders for Inpatient glycemic control:  IV insulin/DKA order set Inpatient Diabetes Program Recommendations:    Note that patient admitted with Lactic acidosis and DKA. Note patient on CRRT.  Once DKA is cleared, may consider Levemir 6 units bid and sensitive Novolog correction q 4 hours.  Insulin drip rates are minimal at this time (0.5 units/hr).   Will follow.    Thanks,  Adah Perl, RN, BC-ADM Inpatient Diabetes Coordinator Pager [phone] (8a-5p)

## 2019-11-24 NOTE — Progress Notes (Signed)
Pt continues to complain of painful leg cramps. Heat and gentle massage tried, but not effective. Elink made aware. Phlebotomy called to get AM labs, Will continue to assess.

## 2019-11-24 NOTE — Progress Notes (Signed)
eLink Physician-Brief Progress Note Patient Name: Paula Wheeler DOB: 1951/04/19 MRN: [id number]   Date of Service  11/24/2019  HPI/Events of Note  Patient having leg cramps despite Calcium correction. Corrected calcium now 8.6. Patient states she usually have leg cramps that she would just walk off. Not able to take oral meds.  eICU Interventions  Ordered one time dose of morphine 1 mg     Intervention Category Intermediate Interventions: Pain - evaluation and management  Judd Lien 11/24/2019, 6:34 AM

## 2019-11-24 NOTE — Progress Notes (Signed)
[place] for heparin Indication: atrial fibrillation  Assessment: 69 yo female with afib to start heparin while eliquis on hold (last dose 7/26 at ~ 8pm). She is noted with DKA and on CRRT  APTT this am 122 sec, heparin level 0.92  Goal of Therapy:  Heparin level 0.3-0.7 units/ml aPTT 66-102 seconds Monitor platelets by anticoagulation protocol: Yes   Plan:  -Decrease heparin to 1050 units/hr -check aPTT and heparin level 6-8 hours  Thanks for allowing pharmacy to be a part of this patient's care.  Excell Seltzer, PharmD Clinical Pharmacist

## 2019-11-24 NOTE — Progress Notes (Signed)
Initial Nutrition Assessment  DOCUMENTATION CODES:   Not applicable  INTERVENTION:    Ensure Enlive po BID, each supplement provides 350 kcal and 20 grams of protein  B complex with Vitamin C to account for losses with CRRT  NUTRITION DIAGNOSIS:   Increased nutrient needs related to acute illness (AKI requiring CRRT) as evidenced by estimated needs.  GOAL:   Patient will meet greater than or equal to 90% of their needs  MONITOR:   PO intake, Supplement acceptance, Weight trends, Labs, I & O's  REASON FOR ASSESSMENT:   Rounds    ASSESSMENT:   Patient with PMH significant for DM, essential HTN, HLD, PAD, CKD IV, GERD, and CHF. Presents this admission with metformin induced lactic acidosis in setting of CKD.   Pt discussed during ICU rounds and with RN.   Off pressors. Net even on CRRT. Possibly new ESRD.   Pt endorses a loss of appetite a few days prior to admission due to nausea. States prior to this she consumed 3 meals daily that consist of good protein sources. Eager to eat this admission. Diet advanced this am. Discussed the importance of protein intake for preservation of lean body mass and wound healing. Okay to try supplementation.   Pt endorses a UBW of 230 lb and denies recent wt loss. Records indicate pt's weight has fluctuated from 92.5 kg to 103.2 kg over the last year. Unable to determine dry wt loss vs fluid fluctuation given CHF hx.   I/O: +1,233 ml since admit  UOP: 65 ml as of this afterrnoon CRRT: 2,041 ml x 24 hrs   Drips: D5 in 1/2NS @ 75 ml/hr, insulin Labs: Na 134 (L) Cr 2.99-trending down CBG 115-289  NUTRITION - FOCUSED PHYSICAL EXAM:    Most Recent Value  Orbital Region No depletion  Upper Arm Region No depletion  Thoracic and Lumbar Region Unable to assess  Buccal Region No depletion  Temple Region No depletion  Clavicle Bone Region No depletion  Clavicle and Acromion Bone Region No depletion  Scapular Bone Region No depletion   Dorsal Hand Unable to assess  Patellar Region No depletion  Anterior Thigh Region No depletion  Posterior Calf Region No depletion  Edema (RD Assessment) Mild  Hair Reviewed  Eyes Reviewed  Mouth Reviewed  Skin Reviewed  Nails Reviewed     Diet Order:   Diet Order            Diet Carb Modified Fluid consistency: Thin; Room service appropriate? Yes  Diet effective now                 EDUCATION NEEDS:   Education needs have been addressed  Skin:  Skin Assessment: Reviewed RN Assessment  Last BM:  PTA  Height:   Ht Readings from Last 1 Encounters:  11/23/19 5\' 5"  (1.651 m)    Weight:   Wt Readings from Last 1 Encounters:  11/24/19 (!) 99.3 kg    BMI:  Body mass index is 36.43 kg/m.  Estimated Nutritional Needs:   Kcal:  2100-2300 kcal  Protein:  105-120 grams  Fluid:  >/= 2.1 L/day   Mariana Single RD, LDN Clinical Nutrition Pager listed in [place]

## 2019-11-24 NOTE — Progress Notes (Signed)
[facility]  Subjective  - feet hurt   Objective 120/65 (!) 106 (!) 97.5 F (36.4 C) (Oral) 23 97%  Intake/Output Summary (Last 24 hours) at 11/24/2019 0740 Last data filed at 11/24/2019 0700 Gross per 24 hour  Intake 3764.48 ml  Output 2293 ml  Net 1471.48 ml   Has PT doppler signals today New blisters on left pretibial and probably early right pretibial Off levophed on heparin drip on CVVH  Assessment/Planning: PAD  Currently not a  Candidate for revascularization due to other more pressing medical problems  She has viable extremities would protect legs to avoid injury and new wounds  As renal function improves or stabilizes may consider arteriogram but most likely not during this hospital admission  Available if she needs more permanent dialysis access otherwise will recheck in a few days  Paula Wheeler 11/24/2019 7:40 AM --  Laboratory Lab Results: Recent Labs    11/23/19 0147 11/23/19 0203 11/23/19 0218 11/23/19 0444  WBC 20.1*  --   --  23.3*  HGB 10.3*   < > 11.9* 9.4*  HCT 36.0   < > 35.0* 32.4*  PLT 302  --   --  258   < > = values in this interval not displayed.   BMET Recent Labs    11/23/19 1647 11/24/19 0436  NA 133* 135  K 4.3 5.0  CL 98 99  CO2 14* 18*  GLUCOSE 132* 148*  BUN 51* 31*  CREATININE 5.90* 3.58*  CALCIUM 7.3* 8.0*    COAG Lab Results  Component Value Date   INR 2.2 (H) 11/23/2019   INR 1.60 01/29/2018   No results found for: PTT

## 2019-11-25 DIAGNOSIS — A419 Sepsis, unspecified organism: Secondary | ICD-10-CM | POA: Diagnosis not present

## 2019-11-25 LAB — RENAL FUNCTION PANEL
Albumin: 3.1 g/dL — ABNORMAL LOW (ref 3.5–5.0)
Anion gap: 11 (ref 5–15)
BUN: 11 mg/dL (ref 8–23)
CO2: 23 mmol/L (ref 22–32)
Calcium: 8.4 mg/dL — ABNORMAL LOW (ref 8.9–10.3)
Chloride: 101 mmol/L (ref 98–111)
Creatinine, Ser: 1.71 mg/dL — ABNORMAL HIGH (ref 0.44–1.00)
GFR calc Af Amer: 35 mL/min — ABNORMAL LOW (ref >60)
GFR calc non Af Amer: 30 mL/min — ABNORMAL LOW (ref >60)
Glucose, Bld: 162 mg/dL — ABNORMAL HIGH (ref 70–99)
Phosphorus: 2.1 mg/dL — ABNORMAL LOW (ref 2.5–4.6)
Potassium: 4.4 mmol/L (ref 3.5–5.1)
Sodium: 135 mmol/L (ref 135–145)

## 2019-11-25 LAB — GLUCOSE, CAPILLARY
Glucose-Capillary: 146 mg/dL — ABNORMAL HIGH (ref 70–99)
Glucose-Capillary: 150 mg/dL — ABNORMAL HIGH (ref 70–99)
Glucose-Capillary: 152 mg/dL — ABNORMAL HIGH (ref 70–99)
Glucose-Capillary: 158 mg/dL — ABNORMAL HIGH (ref 70–99)
Glucose-Capillary: 160 mg/dL — ABNORMAL HIGH (ref 70–99)
Glucose-Capillary: 160 mg/dL — ABNORMAL HIGH (ref 70–99)
Glucose-Capillary: 163 mg/dL — ABNORMAL HIGH (ref 70–99)
Glucose-Capillary: 165 mg/dL — ABNORMAL HIGH (ref 70–99)
Glucose-Capillary: 166 mg/dL — ABNORMAL HIGH (ref 70–99)
Glucose-Capillary: 167 mg/dL — ABNORMAL HIGH (ref 70–99)
Glucose-Capillary: 181 mg/dL — ABNORMAL HIGH (ref 70–99)
Glucose-Capillary: 182 mg/dL — ABNORMAL HIGH (ref 70–99)
Glucose-Capillary: 183 mg/dL — ABNORMAL HIGH (ref 70–99)
Glucose-Capillary: 185 mg/dL — ABNORMAL HIGH (ref 70–99)
Glucose-Capillary: 185 mg/dL — ABNORMAL HIGH (ref 70–99)
Glucose-Capillary: 187 mg/dL — ABNORMAL HIGH (ref 70–99)
Glucose-Capillary: 200 mg/dL — ABNORMAL HIGH (ref 70–99)
Glucose-Capillary: 201 mg/dL — ABNORMAL HIGH (ref 70–99)
Glucose-Capillary: 203 mg/dL — ABNORMAL HIGH (ref 70–99)
Glucose-Capillary: 205 mg/dL — ABNORMAL HIGH (ref 70–99)
Glucose-Capillary: 218 mg/dL — ABNORMAL HIGH (ref 70–99)

## 2019-11-25 LAB — BASIC METABOLIC PANEL
Anion gap: 15 (ref 5–15)
BUN: 10 mg/dL (ref 8–23)
CO2: 18 mmol/L — ABNORMAL LOW (ref 22–32)
Calcium: 8.3 mg/dL — ABNORMAL LOW (ref 8.9–10.3)
Chloride: 100 mmol/L (ref 98–111)
Creatinine, Ser: 1.68 mg/dL — ABNORMAL HIGH (ref 0.44–1.00)
GFR calc Af Amer: 36 mL/min — ABNORMAL LOW (ref >60)
GFR calc non Af Amer: 31 mL/min — ABNORMAL LOW (ref >60)
Glucose, Bld: 186 mg/dL — ABNORMAL HIGH (ref 70–99)
Potassium: 4 mmol/L (ref 3.5–5.1)
Sodium: 133 mmol/L — ABNORMAL LOW (ref 135–145)

## 2019-11-25 LAB — HEPARIN LEVEL (UNFRACTIONATED)
Heparin Unfractionated: 0.57 IU/mL (ref 0.30–0.70)
Heparin Unfractionated: 0.41 IU/mL (ref 0.30–0.70)

## 2019-11-25 LAB — MAGNESIUM: Magnesium: 2.4 mg/dL (ref 1.7–2.4)

## 2019-11-25 LAB — APTT
aPTT: 75 seconds — ABNORMAL HIGH (ref 24–36)
aPTT: 43 seconds — ABNORMAL HIGH (ref 24–36)

## 2019-11-25 LAB — LACTIC ACID, PLASMA: Lactic Acid, Venous: 1.8 mmol/L (ref 0.5–1.9)

## 2019-11-25 LAB — BETA-HYDROXYBUTYRIC ACID: Beta-Hydroxybutyric Acid: 2.81 mmol/L — ABNORMAL HIGH (ref 0.05–0.27)

## 2019-11-25 MED ORDER — VANCOMYCIN VARIABLE DOSE PER UNSTABLE RENAL FUNCTION (PHARMACIST DOSING): Status: DC

## 2019-11-25 MED FILL — Norepinephrine Bitartrate IV Soln 1 MG/ML (Base Equivalent): INTRAVENOUS | Qty: 4 | Status: AC

## 2019-11-25 MED FILL — Sodium Chloride IV Soln 0.9%: INTRAVENOUS | Qty: 500 | Status: AC

## 2019-11-25 NOTE — Progress Notes (Signed)
NAME:  Paula Wheeler, MRN:  [id number], DOB:  March 26, 1951, LOS: 2 ADMISSION DATE:  11/23/2019, CONSULTATION DATE:  11/23/19 REFERRING MD:  Wyvonnia Dusky  CHIEF COMPLAINT:  Abd pain   Brief History   Paula Wheeler is a 69 y.o. female who was admitted 7/27 with sepsis of unclear etiology, significant AoCKD, and multiple metabolic derangements.  Possible component DKA.  History of present illness    Paula Wheeler is a 69 y.o. female who has a PMH as outlined below.  She presented to [facility] ED 7/27 with generalized abdominal pain, LE pain, nausea/vomiting. She had not been able to tolerate any PO for 2 days.  Abdominal pain is somewhat chronic but recently worse mainly over left lower side.  LE pain also chronic due to claudication, but worse over past 2 days  In ED, she was found to be hypotensive and had multiple metabolic derangements including significant AoCKD and profound lactic acidosis.  There is also some concern for DKA.  CTA abd / pelv and CTA aorto bifem showed chronic PAD but no evidence of acute ischemia. Presentation consistent with DKA, AKI w/ metformin induced lactic acidosis.    Past Medical History  has GI bleed; Symptomatic anemia; Diabetes mellitus with complication ([place]); Essential hypertension; HLD (hyperlipidemia); Anemia; Renal insufficiency; Persistent atrial fibrillation; PAD (peripheral artery disease) ([place]); Acute renal failure superimposed on stage 4 chronic kidney disease ([place]); Type II diabetes mellitus with renal manifestations ([place]); GERD (gastroesophageal reflux disease); Depression; Tobacco abuse; CKD (chronic kidney disease) stage 4, GFR 15-29 ml/min (HCC); SOB (shortness of breath); Elevated troponin; Leukocytosis; Chronic diastolic CHF (congestive heart failure) ([place]); Acute renal failure (ARF) ([place]); Lactic acidosis; Hyperkalemia; Acute encephalopathy; Sepsis ([place]); and Shock circulatory ([place]) on their problem list.  Significant Hospital Events   7/27 >  admit.CRRT  Consults:  Nephrology.  Procedures:  HD cath complete 7/27 >   Significant Diagnostic Tests:  CTA abd / pelv 7/27 > Patchy subpleural airspace disease most commonly from atypical or inflammatory pneumonia.  Extensive atherosclerosis with high-grade narrowing of the celiac and left renal artery CTA aorto bifem 7/27 > severe PAD,with flow reducing stenoses/chronic occlusions on the right more than left  Micro Data:  COVID 7/27 > neg Blood 7/27 > NGTD 48 hours Urine 7/27 > NGTD 48 hours  Antimicrobials:  Vanc 7/27 >  Cefepime 7/27 >    Interim history/subjective:   Critically ill, on CRRT Off pressors Afebrile No U/O overnight Denies abdominal pain today, sitting up and mentating well.  Objective:  Blood pressure (!) 114/56, pulse 92, temperature 97.8 F (36.6 C), temperature source Oral, resp. rate 19, height 5\' 5"  (1.651 m), weight (!) 98.8 kg, SpO2 95 %.        Intake/Output Summary (Last 24 hours) at 11/25/2019 0942 Last data filed at 11/25/2019 0900 Gross per 24 hour  Intake 2059.35 ml  Output 3632 ml  Net -1572.65 ml   Filed Weights   11/23/19 1200 11/24/19 0414 11/25/19 0420  Weight: (!) 99 kg (!) 99.3 kg (!) 98.8 kg    Examination: General: Adult female, critically ill. Neuro: Alert, interactive, no asterixis HEENT: [place]/AT. Sclerae anicteric.  EOMI. Cardiovascular: IRIR, no M/R/G.  Lungs: No accessory muscle use decreased breath sounds bilateral Abdomen: Soft, non-distended Musculoskeletal: No gross deformities, no edema.  Skin: LE slightly mottled.  SCDs removed, legs dressed.  Chest x-ray 7/28 personally reviewed left lower lobe infiltrate, cardiomegaly  Labs show decreasing lactate to 1.8, resolved hyperkalemia, beta hydroxy still  elevated at 2.81, sugars 165-205 range  Assessment & Plan:  No evidence of lumbar bowel ischemia most likely etiology seems to be Metformin induced lactic acidosis in the setting of CKD, empirically being  treated for sepsis/left lower lobe pneumonia   Septic shock -improving  Left lower lobe pneumonia - Continue empiric cefepime and vancomycin - Cultures negative 48 hours  Profound lactic acidosis - exacerbated by metformin, lactate resolved AKI on CKD. -Continue CRRT, renal following -small volume output over last few days, hopeful for renal recovery here  Possible DKA. Hx DM2. BHB still 2.9, gap now 15 with lactate normalizing. Unclear etiology, still on insulin drip, will trend - Continue insulin gtt empirically. -? If 2/2 to lack of good po intake.  -pt is otherwise improving at this time. May d/c beta hydroxy levels and transition -Start diet and if CBGs go about 200 can DC D5 fluids - Hold home novolin, metformin.  Abdominal pain and LE pain -no evidence of ischemic bowel or ischemic limb although has severe PAD, DC SCDs due to blisters -Vascular following   Hx a.fib, HTN, HLD. - Heparin gtt in lieu of home eliquis. - Hold home amlodipine, atorvastatin, bisoprolol, torsemide.  Hx tobacco dependence, possible COPD (no PFT's). - BD's. - Hold home spiriva. - Tobacco cessation counseling.  Best Practice:  Diet: NPO. Pain/Anxiety/Delirium protocol (if indicated): N/A. VAP protocol (if indicated): N/A. DVT prophylaxis: SCD's / Heparin. GI prophylaxis: N/A. Glucose control: Insulin gtt. Mobility: Bedrest. Code Status: Full. Family Communication: Brother  Disposition: ICU.   Critical care time: The patient is critically ill with multiple organ systems failure and requires high complexity decision making for assessment and support, frequent evaluation and titration of therapies, application of advanced monitoring technologies and extensive interpretation of multiple databases.  Critical care time 34 mins. This represents my time independent of the NPs time taking care of the pt. This is excluding procedures.    [facility] 11/25/2019, 5:19 PM

## 2019-11-25 NOTE — Procedures (Signed)
Admit: 11/23/2019 LOS: 2  43F CKD4 with severe lactic metabolic acidosis, hyeprkalemia, AoCKD  Current CRRT Prescription: Start Date: 7/27 Catheter: R IJ Temp placed 7/27 in ED BFR: 200 Pre Blood Pump: 400 4K DFR: 2000 4K Replacement Rate: 300 4K Goal UF: Net even Anticoagulation: systemic heparin Clotting: none  S:  BHB and lactate improving, BHB remains up some  Tolerating CRRT, 1.3L net negative yesterday  Off pressors  0.25L UOP yesterday  HCO3 25, K 4.4, P 2.1  O: 07/28 0701 - 07/29 0700 In: 2135.5 [I.V.:1612; IV Piggyback:523.6] Out: 3460 [Urine:255]  Filed Weights   11/23/19 1200 11/24/19 0414 11/25/19 0420  Weight: (!) 99 kg (!) 99.3 kg (!) 98.8 kg    Recent Labs  Lab 11/24/19 0436 11/24/19 0817 11/24/19 1644 11/25/19 0308 11/25/19 1029  NA 135   < > 135 135 133*  K 5.0   < > 4.5 4.4 4.0  CL 99   < > 100 101 100  CO2 18*   < > 20* 23 18*  GLUCOSE 148*   < > 191* 162* 186*  BUN 31*   < > 18 11 10   CREATININE 3.58*   < > 2.33* 1.71* 1.68*  CALCIUM 8.0*   < > 8.1* 8.4* 8.3*  PHOS 3.7  --  2.8 2.1*  --    < > = values in this interval not displayed.   Recent Labs  Lab 11/23/19 0147 11/23/19 0203 11/23/19 0218 11/23/19 0444 11/24/19 0817  WBC 20.1*  --   --  23.3* 17.1*  NEUTROABS 16.8*  --   --   --   --   HGB 10.3*   < > 11.9* 9.4* 9.1*  HCT 36.0   < > 35.0* 32.4* 27.0*  MCV 111.8*  --   --  107.6* 94.4  PLT 302  --   --  258 196   < > = values in this interval not displayed.    Scheduled Meds: . B-complex with vitamin C  1 tablet Oral Daily  . Chlorhexidine Gluconate Cloth  6 each Topical Daily  . feeding supplement (ENSURE ENLIVE)  237 mL Oral BID BM  . ipratropium-albuterol  3 mL Nebulization TID  . sodium chloride flush  10-40 mL Intracatheter Q12H   Continuous Infusions: .  prismasol BGK 4/2.5 400 mL/hr at 11/25/19 0051  .  prismasol BGK 4/2.5 300 mL/hr at 11/24/19 2011  . sodium chloride Stopped (11/23/19 1628)  . sodium  chloride Stopped (11/24/19 5035)  . ceFEPime (MAXIPIME) IV 2 g (11/25/19 1014)  . dextrose 5 % and 0.45% NaCl 50 mL/hr at 11/25/19 0900  . heparin 800 Units/hr (11/25/19 0900)  . insulin 0.8 mL/hr at 11/25/19 0900  . norepinephrine (LEVOPHED) Adult infusion Stopped (11/23/19 1303)  . prismasol BGK 4/2.5 2,000 mL/hr at 11/25/19 0830  . vancomycin Stopped (11/25/19 0631)   PRN Meds:.sodium chloride, albuterol, dextrose, docusate sodium, heparin, HYDROmorphone (DILAUDID) injection, phenol, polyethylene glycol, sodium chloride flush  ABG    Component Value Date/Time   HCO3 12.8 (L) 11/23/2019 1605   TCO2 <5 (L) 11/23/2019 0218   ACIDBASEDEF 14.0 (H) 11/23/2019 1605   O2SAT 49.1 11/23/2019 1605    A/P  1. Anuric dialysis dependent AoCKD4; possibly new ESRD 2. Shock, improved off pressors 3. PNA on Vanc/Cefepime per CCM 4. Severe AG metabolic acidosis, inc lactate +BHB: ? DKA ? Metformin toxicity; on metformin at admit; improved. BHB still up on insulin gtt 5. Hyperkalemia, improved on all 4K  6. Hyperphosphatemia, trend on CRRT, 3.7 this AM 7. DM2, hyperglycemia, per CCM 8. Severe PVD 9. COPD 10. Anemia, hb 9s  Stop CRRT should be able to transition to [facility], possible next Tx 7/30.  Likely to req [facility] and possible AVF/G, reassess in AM.    Pearson Grippe, MD [place]

## 2019-11-25 NOTE — Evaluation (Signed)
Physical Therapy Evaluation Patient Details Name: Paula Wheeler MRN: [id number] DOB: 02-21-1951 Today's Date: 11/25/2019   History of Present Illness  69 yo admitted 7/27 with sepsis of unknown origin with multiple metabolic derangements, bil LE wounds. PMHx: DM, AFib, CHF, PAD, HLD, HTN, anemia, depression  Clinical Impression  Pt very pleasant and wanting to mobilize. 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 Pt with bil LE numbness per her report which is new from admission. Pt with decreased strength, transfers, gait and functional mobility with supportive family. Pt will benefit from acute therapy to maximize mobility, safety and function to decrease burden of care.   HR up to 128 with activity    Follow Up Recommendations CIR;Supervision/Assistance - 24 hour    Equipment Recommendations  3in1 (PT)    Recommendations for Other Services       Precautions / Restrictions Precautions Precautions: Fall      Mobility  Bed Mobility Overal bed mobility: Needs Assistance Bed Mobility: Supine to Sit     Supine to sit: Mod assist;HOB elevated     General bed mobility comments: HOB 35 degrees with cues for sequence, assist to elevate trunk and bring legs to EOB and increased time  Transfers Overall transfer level: Needs assistance   Transfers: Sit to/from Stand Sit to Stand: Mod assist;+2 safety/equipment         General transfer comment: initial cues for hand placement and safety with pt with posterior lean on initial rise  Ambulation/Gait Ambulation/Gait assistance: Mod assist;+2 safety/equipment Gait Distance (Feet): 10 Feet Assistive device: Rolling walker (2 wheeled) Gait Pattern/deviations: Step-through pattern;Decreased stride length;Trunk flexed   Gait velocity interpretation: 1.31 - 2.62 ft/sec, indicative of limited community ambulator General Gait Details: cues for posture, proximity to RW, safety and assist for  anterior translation with progression from mod to min assist with close chair follow and pt limited by fatigue  Stairs            Wheelchair Mobility    Modified Rankin (Stroke Patients Only)       Balance Overall balance assessment: Needs assistance Sitting-balance support: Feet supported Sitting balance-Leahy Scale: Fair     Standing balance support: Bilateral upper extremity supported Standing balance-Leahy Scale: Poor Standing balance comment: bil UE support and physical assist for standing balance                             Pertinent Vitals/Pain Pain Assessment: 0-10 Pain Score: 4  Pain Location: bi LE Pain Descriptors / Indicators: Numbness Pain Intervention(s): Limited activity within patient's tolerance;Monitored during session;Repositioned    Home Living Family/patient expects to be discharged to:: Private residence Living Arrangements: Alone Available Help at Discharge: Family;Available 24 hours/day Type of Home: House Home Access: Level entry     Home Layout: One level Home Equipment: Walker - 2 wheels Additional Comments: pt typically sleeps in recliner due to WOB    Prior Function Level of Independence: Independent with assistive device(s)         Comments: works at an Human resources officer. Lives alone brother lives an hour away, doesn't work and states pt can stay with him     Hand Dominance        Extremity/Trunk Assessment   Upper Extremity Assessment Upper Extremity Assessment: Generalized weakness    Lower Extremity Assessment Lower Extremity Assessment: RLE deficits/detail;LLE deficits/detail RLE Deficits / Details: hip flexion 2+/5, knee  extension 3/5 knee flexion 3/5 LLE Deficits / Details: hip flexion 2+/5, knee extension 3/5 knee flexion 3/5    Cervical / Trunk Assessment Cervical / Trunk Assessment: Kyphotic  Communication   Communication: No difficulties  Cognition Arousal/Alertness: Awake/alert Behavior  During Therapy: WFL for tasks assessed/performed Overall Cognitive Status: Within Functional Limits for tasks assessed                                        General Comments      Exercises General Exercises - Lower Extremity Hip Flexion/Marching: AROM;Both;Seated;5 reps   Assessment/Plan    PT Assessment Patient needs continued PT services  PT Problem List Decreased strength;Decreased mobility;Decreased activity tolerance;Decreased balance;Decreased knowledge of use of DME       PT Treatment Interventions Gait training;Balance training;Functional mobility training;Stair training;Therapeutic activities;Patient/family education;DME instruction;Therapeutic exercise    PT Goals (Current goals can be found in the Care Plan section)  Acute Rehab PT Goals Patient Stated Goal: return to work and home PT Goal Formulation: With patient Time For Goal Achievement: 12/09/19 Potential to Achieve Goals: Good    Frequency Min 3X/week   Barriers to discharge Decreased caregiver support pt lives alone but brother states pt can stay with him    Co-evaluation               AM-PAC PT "6 Clicks" Mobility  Outcome Measure Help needed turning from your back to your side while in a flat bed without using bedrails?: A Lot Help needed moving from lying on your back to sitting on the side of a flat bed without using bedrails?: A Lot Help needed moving to and from a bed to a chair (including a wheelchair)?: A Lot Help needed standing up from a chair using your arms (e.g., wheelchair or bedside chair)?: A Lot Help needed to walk in hospital room?: A Lot Help needed climbing 3-5 steps with a railing? : Total 6 Click Score: 11    End of Session Equipment Utilized During Treatment: Gait belt Activity Tolerance: Patient tolerated treatment well Patient left: in chair;with call bell/phone within reach;with chair alarm set;with family/visitor present Nurse Communication: Mobility  status PT Visit Diagnosis: Other abnormalities of gait and mobility (R26.89);Muscle weakness (generalized) (M62.81);Difficulty in walking, not elsewhere classified (R26.2)    Time: 1448-1856 PT Time Calculation (min) (ACUTE ONLY): 21 min   Charges:   PT Evaluation $PT Eval Moderate Complexity: [place], PT Acute Rehabilitation Services Pager: [phone] Office: [phone]   Sandy Salaam Siarah Deleo 11/25/2019, 10:47 AM

## 2019-11-25 NOTE — Progress Notes (Signed)
[place] for heparin Indication: atrial fibrillation  Assessment: 69 yo female with afib to start heparin while apixaban on hold (last dose 7/26 at ~ 8pm). She is admitted with DKA and on CRRT.   APTT this afternoon still elevated at 138 sec even after adjustments early this morning. Heparin level 0.92 elevated due to recent apixaban.   7/29 AM update:  APTT therapeutic after rate decrease Heparin level also very close to correlating-should be able to use heparin level only starting 7/30  Goal of Therapy:  Heparin level 0.3-0.7 units/ml aPTT 66-102 seconds Monitor platelets by anticoagulation protocol: Yes   Plan:  Cont heparin at 800 units/hr 1200 aPTT/heparin level  Narda Bonds, PharmD, BCPS Clinical Pharmacist Phone: [phone]

## 2019-11-25 NOTE — Progress Notes (Signed)
[place] for heparin Indication: atrial fibrillation  Assessment: 69 yo female with afib to start heparin while apixaban on hold (last dose 7/26 at ~ 8pm). She is admitted with DKA and on CRRT with plans to transition to HD this week.  -Heparin level= 0.41   Goal of Therapy:  Heparin level 0.3-0.7 units/ml aPTT 66-102 seconds Monitor platelets by anticoagulation protocol: Yes   Plan:  Cont heparin at 800 units/hr Daily heparin level and CBC Discontinue daily aPTTs  Hildred Laser, PharmD Clinical Pharmacist **Pharmacist phone directory can now be found on [email or web address] ([email or web address]).  Listed under [place].

## 2019-11-25 NOTE — Progress Notes (Signed)
Inpatient Rehab Admissions Coordinator Note:   Per PT recommendations, pt was screened for CIR candidacy by Shann Medal, PT, DPT.  At this time we are recommending an IP Rehab MD Consult.  Please place an order if pt would like to be considered.   Shann Medal, PT, DPT [phone] 11/25/19 3:36 PM

## 2019-11-25 NOTE — Progress Notes (Signed)
Pharmacy Antibiotic Note  Paula Wheeler is a 69 y.o. female admitted on 11/23/2019 with sepsis.  Pharmacy has been consulted for vancomycin and cefepime dosing. She has been on CRRT since 7/27 and plans for HD this week (CRRT to stop today) -SCr= 1.68 -cultures- ngtd   Plan: -Change cefepime to 1gmI Vq24h -Hold vancomycin and check a random level on 7/30 -F/u renal function, cultures and clinical course  Height: 5\' 5"  (165.1 cm) Weight: (!) 98.8 kg (217 lb 13 oz) IBW/kg (Calculated) : 57  Temp (24hrs), Avg:98 F (36.7 C), Min:97.7 F (36.5 C), Max:98.3 F (36.8 C)  Recent Labs  Lab 11/23/19 0147 11/23/19 0203 11/23/19 0400 11/23/19 0444 11/23/19 0939 11/23/19 1331 11/23/19 1605 11/24/19 0436 11/24/19 0817 11/24/19 1210 11/24/19 1644 11/25/19 0308 11/25/19 1029  WBC 20.1*  --   --  23.3*  --   --   --   --  17.1*  --   --   --   --   CREATININE 10.12*   < >  --  9.68*   < >  --    < > 3.58* 2.99*  --  2.33* 1.71* 1.68*  LATICACIDVEN >11.0*   < > >11.0*  --   --  10.5*  --  6.6*  --  3.9*  --  1.8  --    < > = values in this interval not displayed.    Estimated Creatinine Clearance: 36.8 mL/min (A) (by C-G formula based on SCr of 1.68 mg/dL (H)).    Allergies  Allergen Reactions  . Nsaids Swelling    Hands and legs swell  . Penicillins Hives    Has patient had a PCN reaction causing immediate rash, facial/tongue/throat swelling, SOB or lightheadedness with hypotension: Yes Has patient had a PCN reaction causing severe rash involving mucus membranes or skin necrosis: No Has patient had a PCN reaction that required hospitalization: No Has patient had a PCN reaction occurring within the last 10 years: No If all of the above answers are "NO", then may proceed with Cephalosporin use.     Thank you for allowing pharmacy to be a part of this patient's care.  Hildred Laser, PharmD Clinical Pharmacist **Pharmacist phone directory can now be found on [email or web address] ([email or web address]).  Listed under [place].

## 2019-11-26 LAB — RENAL FUNCTION PANEL
Albumin: 2.7 g/dL — ABNORMAL LOW (ref 3.5–5.0)
Albumin: 2.8 g/dL — ABNORMAL LOW (ref 3.5–5.0)
Anion gap: 9 (ref 5–15)
Anion gap: 10 (ref 5–15)
BUN: 14 mg/dL (ref 8–23)
BUN: 17 mg/dL (ref 8–23)
CO2: 24 mmol/L (ref 22–32)
CO2: 23 mmol/L (ref 22–32)
Calcium: 8.5 mg/dL — ABNORMAL LOW (ref 8.9–10.3)
Calcium: 8.4 mg/dL — ABNORMAL LOW (ref 8.9–10.3)
Chloride: 102 mmol/L (ref 98–111)
Chloride: 100 mmol/L (ref 98–111)
Creatinine, Ser: 2.31 mg/dL — ABNORMAL HIGH (ref 0.44–1.00)
Creatinine, Ser: 2.61 mg/dL — ABNORMAL HIGH (ref 0.44–1.00)
GFR calc Af Amer: 24 mL/min — ABNORMAL LOW (ref >60)
GFR calc Af Amer: 21 mL/min — ABNORMAL LOW (ref >60)
GFR calc non Af Amer: 21 mL/min — ABNORMAL LOW (ref >60)
GFR calc non Af Amer: 18 mL/min — ABNORMAL LOW (ref >60)
Glucose, Bld: 173 mg/dL — ABNORMAL HIGH (ref 70–99)
Glucose, Bld: 181 mg/dL — ABNORMAL HIGH (ref 70–99)
Phosphorus: 1.9 mg/dL — ABNORMAL LOW (ref 2.5–4.6)
Phosphorus: 1.5 mg/dL — ABNORMAL LOW (ref 2.5–4.6)
Potassium: 3.9 mmol/L (ref 3.5–5.1)
Potassium: 3.9 mmol/L (ref 3.5–5.1)
Sodium: 135 mmol/L (ref 135–145)
Sodium: 133 mmol/L — ABNORMAL LOW (ref 135–145)

## 2019-11-26 LAB — GLUCOSE, CAPILLARY
Glucose-Capillary: 162 mg/dL — ABNORMAL HIGH (ref 70–99)
Glucose-Capillary: 168 mg/dL — ABNORMAL HIGH (ref 70–99)
Glucose-Capillary: 159 mg/dL — ABNORMAL HIGH (ref 70–99)
Glucose-Capillary: 158 mg/dL — ABNORMAL HIGH (ref 70–99)
Glucose-Capillary: 167 mg/dL — ABNORMAL HIGH (ref 70–99)
Glucose-Capillary: 165 mg/dL — ABNORMAL HIGH (ref 70–99)
Glucose-Capillary: 157 mg/dL — ABNORMAL HIGH (ref 70–99)
Glucose-Capillary: 171 mg/dL — ABNORMAL HIGH (ref 70–99)
Glucose-Capillary: 261 mg/dL — ABNORMAL HIGH (ref 70–99)
Glucose-Capillary: 194 mg/dL — ABNORMAL HIGH (ref 70–99)
Glucose-Capillary: 172 mg/dL — ABNORMAL HIGH (ref 70–99)
Glucose-Capillary: 157 mg/dL — ABNORMAL HIGH (ref 70–99)
Glucose-Capillary: 165 mg/dL — ABNORMAL HIGH (ref 70–99)
Glucose-Capillary: 177 mg/dL — ABNORMAL HIGH (ref 70–99)

## 2019-11-26 LAB — BLOOD GAS, VENOUS
Acid-base deficit: 14 mmol/L — ABNORMAL HIGH (ref 0.0–2.0)
Bicarbonate: 12.8 mmol/L — ABNORMAL LOW (ref 20.0–28.0)
FIO2: 21
O2 Saturation: 49.1 %
Patient temperature: 36.6
pCO2, Ven: 35.1 mmHg — ABNORMAL LOW (ref 44.0–60.0)
pH, Ven: 7.184 — CL (ref 7.250–7.430)
pO2, Ven: <31 mmHg — CL (ref 32.0–45.0)

## 2019-11-26 LAB — MAGNESIUM: Magnesium: 2.4 mg/dL (ref 1.7–2.4)

## 2019-11-26 LAB — VANCOMYCIN, TROUGH: Vancomycin Tr: 20 ug/mL (ref 15–20)

## 2019-11-26 LAB — APTT: aPTT: 40 seconds — ABNORMAL HIGH (ref 24–36)

## 2019-11-26 LAB — BETA-HYDROXYBUTYRIC ACID: Beta-Hydroxybutyric Acid: 0.89 mmol/L — ABNORMAL HIGH (ref 0.05–0.27)

## 2019-11-26 LAB — HEPARIN LEVEL (UNFRACTIONATED): Heparin Unfractionated: 0.32 IU/mL (ref 0.30–0.70)

## 2019-11-26 MED ORDER — INSULIN ASPART 100 UNIT/ML ~~LOC~~ SOLN
0.0000 [IU] | Freq: Three times a day (TID) | SUBCUTANEOUS | Status: DC
  Administered 2019-11-26 – 2019-11-27 (×3): 2 [IU] via SUBCUTANEOUS
  Administered 2019-11-27 – 2019-11-28 (×2): 1 [IU] via SUBCUTANEOUS
  Administered 2019-11-28: 2 [IU] via SUBCUTANEOUS
  Administered 2019-11-28: 3 [IU] via SUBCUTANEOUS
  Administered 2019-11-29: 2 [IU] via SUBCUTANEOUS
  Administered 2019-11-29: 1 [IU] via SUBCUTANEOUS

## 2019-11-26 MED ORDER — ALBUTEROL SULFATE (2.5 MG/3ML) 0.083% IN NEBU: 2.5000 mg | INHALATION_SOLUTION | RESPIRATORY_TRACT | Status: DC | PRN

## 2019-11-26 MED ORDER — SODIUM CHLORIDE 0.9 % IV SOLN: 1.0000 g | INTRAVENOUS | Status: DC

## 2019-11-26 MED ORDER — INSULIN DETEMIR 100 UNIT/ML ~~LOC~~ SOLN
6.0000 [IU] | Freq: Two times a day (BID) | SUBCUTANEOUS | Status: DC
  Administered 2019-11-26 – 2019-11-29 (×7): 6 [IU] via SUBCUTANEOUS
  Filled 2019-11-26 (×8): qty 0.06

## 2019-11-26 MED ORDER — SODIUM CHLORIDE 0.9 % IV SOLN
1.0000 g | INTRAVENOUS | Status: AC
  Administered 2019-11-26 – 2019-11-28 (×3): 1 g via INTRAVENOUS
  Filled 2019-11-26 (×3): qty 1

## 2019-11-26 MED ORDER — VANCOMYCIN HCL IN DEXTROSE 1-5 GM/200ML-% IV SOLN
1000.0000 mg | Freq: Once | INTRAVENOUS | Status: AC
  Administered 2019-11-26: 1000 mg via INTRAVENOUS
  Filled 2019-11-26: qty 200

## 2019-11-26 NOTE — Progress Notes (Signed)
NAME:  Paula Wheeler, MRN:  [id number], DOB:  May 16, 1950, LOS: 3 ADMISSION DATE:  11/23/2019, CONSULTATION DATE:  11/23/19 REFERRING MD:  Wyvonnia Dusky  CHIEF COMPLAINT:  Abd pain   Brief History   Paula Wheeler is a 69 y.o. female who was admitted 7/27 with sepsis of unclear etiology, significant AoCKD, and multiple metabolic derangements.  Possible component DKA.  History of present illness    Paula Wheeler is a 69 y.o. female who has a PMH as outlined below.  She presented to [facility] ED 7/27 with generalized abdominal pain, LE pain, nausea/vomiting. She had not been able to tolerate any PO for 2 days.  Abdominal pain is somewhat chronic but recently worse mainly over left lower side.  LE pain also chronic due to claudication, but worse over past 2 days  In ED, she was found to be hypotensive and had multiple metabolic derangements including significant AoCKD and profound lactic acidosis.  There is also some concern for DKA.  CTA abd / pelv and CTA aorto bifem showed chronic PAD but no evidence of acute ischemia. Presentation consistent with DKA, AKI w/ metformin induced lactic acidosis.  Past Medical History  has GI bleed; Symptomatic anemia; Diabetes mellitus with complication ([place]); Essential hypertension; HLD (hyperlipidemia); Anemia; Renal insufficiency; Persistent atrial fibrillation; PAD (peripheral artery disease) ([place]); Acute renal failure superimposed on stage 4 chronic kidney disease ([place]); Type II diabetes mellitus with renal manifestations ([place]); GERD (gastroesophageal reflux disease); Depression; Tobacco abuse; CKD (chronic kidney disease) stage 4, GFR 15-29 ml/min (HCC); SOB (shortness of breath); Elevated troponin; Leukocytosis; Chronic diastolic CHF (congestive heart failure) ([place]); Acute renal failure (ARF) ([place]); Lactic acidosis; Hyperkalemia; Acute encephalopathy; Sepsis ([place]); and Shock circulatory ([place]) on their problem list.  Significant Hospital Events   7/27 >  admit.CRRT  Consults:  Nephrology.  Procedures:  HD cath complete 7/27 >   Significant Diagnostic Tests:  CTA abd / pelv 7/27 > Patchy subpleural airspace disease most commonly from atypical or inflammatory pneumonia.  Extensive atherosclerosis with high-grade narrowing of the celiac and left renal artery  CTA aorto bifem 7/27 > severe PAD,with flow reducing stenoses/chronic occlusions on the right more than left  Micro Data:  COVID 7/27 > neg Blood 7/27 > NGTD 48 hours Urine 7/27 > NGTD 48 hours  Antimicrobials:  Vanc 7/27 >  Cefepime 7/27 >    Interim history/subjective:  Sitting up in bedside recline, states she feel well with no acute complaints.   Objective:  Blood pressure (!) 115/56, pulse (!) 109, temperature 98.1 F (36.7 C), temperature source Oral, resp. rate 16, height 5\' 5"  (1.651 m), weight (!) 97.6 kg, SpO2 95 %.    FiO2 (%):  [95 %] 95 %   Intake/Output Summary (Last 24 hours) at 11/26/2019 1134 Last data filed at 11/26/2019 1118 Gross per 24 hour  Intake 1418.77 ml  Output 1100 ml  Net 318.77 ml   Filed Weights   11/24/19 0414 11/25/19 0420 11/26/19 0500  Weight: (!) 99.3 kg (!) 98.8 kg (!) 97.6 kg    Examination: General: Chronically ill appearing middle aged female sitting up in bedside recliner in NAD HEENT: [place]/AT, MM pink/moist, PERRL, sclera non-icteric   Neuro: Alert and oriented x3, non-focal  CV: s1s2 regular rate and rhythm, no murmur, rubs, or gallops,  PULM:  Clear to ascultation bilaterally, no increased work of breahting  GI: soft, bowel sounds active in all 4 quadrants, non-tender, non-distended Extremities: warm/dry, no  edema  Skin: no  rashes or lesions  Resolved problems:  DKA Septic shock  -Lactic acidosis resolved 7/29 Profound lactic acidosis  - exacerbated by metformin, lactate resolved  Assessment & Plan:  No evidence of lumbar bowel ischemia most likely etiology seems to be Metformin induced lactic acidosis in the  setting of CKD, empirically being treated for sepsis/left lower lobe pneumonia  Left lower lobe pneumonia -Evidence of pneumonia seen on CT chest P: Continue supplemental oxygen for SPO2 goal greater than 92 Follow culture  Encourage pulmonary hygiene  Mobilize as able  Deescalate to CAP coverage   AKI on CKD. -Hopefully for renal recovery but patient has possibly transitioned to ESRD P: Nephrology following  CRRT stopped 7/29 Plan for iHD schedule per Nephrology  Follow renal function / urine output Trend Bmet Avoid nephrotoxins, ensure adequate renal perfusion   Uncontrolled type 2 diabetes  -DKA physiology resolved 7/29. Hx DM2. BHB still 2.9, gnormalizing. -Unclear etiology, P: Stop Insulin drip 7/31 Transition to CBG q4 Continue carb mod diet  Home novilin and metformin on hold   Abdominal pain and LE pain  -no evidence of ischemic bowel or ischemic limb although has severe PAD P: Vascular following Wound care per vascular  No surgical indications currently  SCD discontinued due to blisters   Hx a.fib, HTN, HLD. P: Continue heparin drip Consider resumption of Eliquis soon  Need to decide on need for permanent HD access first  Home antihypertensives remain on hold  Hx tobacco dependence, possible COPD (no PFT's). P: Continue BD Tobacco cessation discussed   Best Practice:  Diet: Carb mod Pain/Anxiety/Delirium protocol (if indicated): N/A. VAP protocol (if indicated): N/A. DVT prophylaxis: SCD's / Heparin. GI prophylaxis: N/A. Glucose control: Insulin gtt. Mobility: Bedrest. Code Status: Full. Family Communication: Brother  Disposition: Transfer to PCU  Signature:   Johnsie Cancel, NP-C [facility] Contact / Pager information can be found on Amion  11/26/2019, 11:53 AM

## 2019-11-26 NOTE — Progress Notes (Addendum)
Pharmacy Antibiotic Note  Paula Wheeler is a 69 y.o. female admitted on 11/23/2019 with sepsis.  Pharmacy has been consulted for vancomycin and cefepime dosing. She has been on CRRT since 7/27 and plans for HD this week (CRRT to stop today) -SCr= 1.68 -cultures- ngtd, MRSA PCR- neg -vancomycin random= 20   Plan: -Continue cefepime  1gmI Vq24h -Vancomycin 1000mg  IV today then will follow HD plans -F/u renal function, cultures and clinical course  Height: 5\' 5"  (165.1 cm) Weight: (!) 97.6 kg (215 lb 2.7 oz) IBW/kg (Calculated) : 57  Temp (24hrs), Avg:98.6 F (37 C), Min:97.8 F (36.6 C), Max:99.6 F (37.6 C)  Recent Labs  Lab 11/23/19 0147 11/23/19 0203 11/23/19 0400 11/23/19 0444 11/23/19 0939 11/23/19 1331 11/23/19 1605 11/24/19 0436 11/24/19 0436 11/24/19 0817 11/24/19 1210 11/24/19 1644 11/25/19 0308 11/25/19 1029 11/26/19 0145 11/26/19 0329  WBC 20.1*  --   --  23.3*  --   --   --   --   --  17.1*  --   --   --   --   --   --   CREATININE 10.12*   < >  --  9.68*   < >  --    < > 3.58*   < > 2.99*  --  2.33* 1.71* 1.68* 2.31*  --   LATICACIDVEN >11.0*   < > >11.0*  --   --  10.5*  --  6.6*  --   --  3.9*  --  1.8  --   --   --   VANCOTROUGH  --   --   --   --   --   --   --   --   --   --   --   --   --   --   --  20   < > = values in this interval not displayed.    Estimated Creatinine Clearance: 26.6 mL/min (A) (by C-G formula based on SCr of 2.31 mg/dL (H)).    Allergies  Allergen Reactions  . Nsaids Swelling    Hands and legs swell  . Penicillins Hives    Has patient had a PCN reaction causing immediate rash, facial/tongue/throat swelling, SOB or lightheadedness with hypotension: Yes Has patient had a PCN reaction causing severe rash involving mucus membranes or skin necrosis: No Has patient had a PCN reaction that required hospitalization: No Has patient had a PCN reaction occurring within the last 10 years: No If all of the above answers are "NO",  then may proceed with Cephalosporin use.     Thank you for allowing pharmacy to be a part of this patient's care.  Hildred Laser, PharmD Clinical Pharmacist **Pharmacist phone directory can now be found on [email or web address] ([email or web address]).  Listed under [place].

## 2019-11-26 NOTE — Progress Notes (Addendum)
  Progress Note    11/26/2019 8:11 AM * No surgery found *  Subjective:  Minimal discomfort in legs.  Worked with PT yesterday; Cir was recommended.   Vitals:   11/26/19 0700 11/26/19 0744  BP: (!) 115/64   Pulse: (!) 109   Resp: (!) 28   Temp:    SpO2: 90% 98%   Physical Exam: Lungs:  Non labored Extremities:  Brisk AT and PT signals by doppler bilaterally; pretibial blistering Neurologic: A&O  CBC    Component Value Date/Time   WBC 17.1 (H) 11/24/2019 0817   RBC 2.86 (L) 11/24/2019 0817   HGB 9.1 (L) 11/24/2019 0817   HGB 10.4 (L) 03/31/2017 0846   HCT 27.0 (L) 11/24/2019 0817   HCT 32.6 (L) 03/31/2017 0846   PLT 196 11/24/2019 0817   PLT 355 03/31/2017 0846   MCV 94.4 11/24/2019 0817   MCV 88 03/31/2017 0846   MCH 31.8 11/24/2019 0817   MCHC 33.7 11/24/2019 0817   RDW 14.1 11/24/2019 0817   RDW 15.6 (H) 03/31/2017 0846   LYMPHSABS 2.0 11/23/2019 0147   LYMPHSABS 1.8 08/20/2016 0815   MONOABS 0.7 11/23/2019 0147   EOSABS 0.0 11/23/2019 0147   EOSABS 0.2 08/20/2016 0815   BASOSABS 0.1 11/23/2019 0147   BASOSABS 0.0 08/20/2016 0815    BMET    Component Value Date/Time   NA 135 11/26/2019 0145   NA 143 05/11/2019 0837   K 3.9 11/26/2019 0145   CL 102 11/26/2019 0145   CO2 24 11/26/2019 0145   GLUCOSE 173 (H) 11/26/2019 0145   BUN 14 11/26/2019 0145   BUN 32 (H) 05/11/2019 0837   CREATININE 2.31 (H) 11/26/2019 0145   CREATININE 1.41 (H) 05/13/2016 0825   CALCIUM 8.5 (L) 11/26/2019 0145   GFRNONAA 21 (L) 11/26/2019 0145   GFRAA 24 (L) 11/26/2019 0145    INR    Component Value Date/Time   INR 2.2 (H) 11/23/2019 0147     Intake/Output Summary (Last 24 hours) at 11/26/2019 0811 Last data filed at 11/26/2019 0400 Gross per 24 hour  Intake 1476.91 ml  Output 1106 ml  Net 370.91 ml     Assessment/Plan:  69 y.o. female with pretibial blistering  Signals continue to improve Continue current wound care; we will allow time for wounds to heal now  that pressors are off Call if TDC/permanent access required    Dagoberto Ligas, PA-C [facility] [phone] 11/26/2019 8:11 AM   I have seen and evaluated the patient. I agree with the PA note as documented above. Patient well known to me for previous surveillance of PAD.  PT signals bilaterally and no distal ulcerations only pre-tibial ulcers.  No foot pain.  Will allow time for her pre-tibial ulcers to demarcate and may require arteriogram and intervention at later date.    Marty Heck, MD [facility] [place] Office: [phone]

## 2019-11-26 NOTE — Progress Notes (Signed)
Admit: 11/23/2019 LOS: 3  55F AoCKD with severe metabolic acidosis, lactic and ketoacidosis  Subjective:  . Stable off CRRT . 0.9L UOP . HCO3 24  . BPs stable, feels good . BHB < 1, Lactate normalized  07/29 0701 - 07/30 0700 In: 1535.5 [P.O.:120; I.V.:1215.4; IV Piggyback:200.1] Out: 1326 [Urine:900]  Filed Weights   11/24/19 0414 11/25/19 0420 11/26/19 0500  Weight: (!) 99.3 kg (!) 98.8 kg (!) 97.6 kg    Scheduled Meds: . B-complex with vitamin C  1 tablet Oral Daily  . Chlorhexidine Gluconate Cloth  6 each Topical Daily  . feeding supplement (ENSURE ENLIVE)  237 mL Oral BID BM  . ipratropium-albuterol  3 mL Nebulization TID  . sodium chloride flush  10-40 mL Intracatheter Q12H  . vancomycin variable dose per unstable renal function (pharmacist dosing)   Does not apply See admin instructions   Continuous Infusions: .  prismasol BGK 4/2.5 400 mL/hr at 11/25/19 0051  .  prismasol BGK 4/2.5 300 mL/hr at 11/24/19 2011  . sodium chloride Stopped (11/23/19 1628)  . sodium chloride Stopped (11/24/19 1884)  . [START ON 11/27/2019] ceFEPime (MAXIPIME) IV    . dextrose 5 % and 0.45% NaCl 50 mL/hr at 11/26/19 1029  . heparin 800 Units/hr (11/26/19 0400)  . insulin 0.9 mL/hr at 11/26/19 0400  . norepinephrine (LEVOPHED) Adult infusion Stopped (11/23/19 1303)  . prismasol BGK 4/2.5 2,000 mL/hr at 11/25/19 0830   PRN Meds:.sodium chloride, albuterol, dextrose, docusate sodium, heparin, HYDROmorphone (DILAUDID) injection, phenol, polyethylene glycol, sodium chloride flush  Current Labs: reviewed    Physical Exam:  Blood pressure (!) 115/56, pulse (!) 109, temperature 98.1 F (36.7 C), temperature source Oral, resp. rate 16, height 5\' 5"  (1.651 m), weight (!) 97.6 kg, SpO2 95 %. NAD, hoarse NCAT RRR Coarse BS b/l S/nt/nd  R IJ Temp Cath bandaged Mottled cold feet    A 1. Anuric dialysis dependent AoCKD4; possibly new ESRD 1. CRRT 7/27 - 7/29 2. BL SCr 2.6  08/2019 3. Follows with Hollie Salk at [facility] 4. Monitor UOP / delta BUN+Cr 5. Potential iHD tomorrow 6. Still has temp HD cath 2. Shock, improved off pressors 3. PNA on Vanc/Cefepime per CCM 4. Severe AG metabolic acidosis, inc lactate +BHB: ? DKA ? Metformin toxicity; on metformin at admit; improved.   5. Hyperkalemia, resolved 6. Hyperphosphatemia, trend off CRRT, 3.7 7. DM2, hyperglycemia, per CCM 8. Severe PVD 9. COPD 10. Anemia, hb 9s 11. Ongoing T user  P . As above, eval tomorrow fo HD, . Inc UOP is promising . Medication Issues; o Preferred narcotic agents for pain control are hydromorphone, fentanyl, and methadone. Morphine should not be used.  o Baclofen should be avoided o Avoid oral sodium phosphate and magnesium citrate based laxatives / bowel preps    Pearson Grippe MD 11/26/2019, 11:18 AM  Recent Labs  Lab 11/24/19 1644 11/24/19 1644 11/25/19 0308 11/25/19 1029 11/26/19 0145  NA 135   < > 135 133* 135  K 4.5   < > 4.4 4.0 3.9  CL 100   < > 101 100 102  CO2 20*   < > 23 18* 24  GLUCOSE 191*   < > 162* 186* 173*  BUN 18   < > 11 10 14   CREATININE 2.33*   < > 1.71* 1.68* 2.31*  CALCIUM 8.1*   < > 8.4* 8.3* 8.5*  PHOS 2.8  --  2.1*  --  1.9*   < > = values in  this interval not displayed.   Recent Labs  Lab 11/23/19 0147 11/23/19 0203 11/23/19 0218 11/23/19 0444 11/24/19 0817  WBC 20.1*  --   --  23.3* 17.1*  NEUTROABS 16.8*  --   --   --   --   HGB 10.3*   < > 11.9* 9.4* 9.1*  HCT 36.0   < > 35.0* 32.4* 27.0*  MCV 111.8*  --   --  107.6* 94.4  PLT 302  --   --  258 196   < > = values in this interval not displayed.

## 2019-11-26 NOTE — Evaluation (Signed)
Occupational Therapy Evaluation Patient Details Name: Paula Wheeler MRN: [id number] DOB: Sep 24, 1950 Today's Date: 11/26/2019    History of Present Illness 69 yo admitted 7/27 with sepsis of unknown origin with multiple metabolic derangements, bil LE wounds. PMHx: DM, AFib, CHF, PAD, HLD, HTN, anemia, depression   Clinical Impression   Pt PTA: Pt living alone and independent; working. Pt currently limited by decreased strength, decreased activity tolerance and decreased ability to care for self. Pt modA overall for ADL and fatigues quickly with OOB ADL. Pt currently standing ~2 mins at a time before requiring seated rest break. Pt minA overall for mobility with RW and transfers due stability and to initiate movement. Pt SpO2 >97%on 2L and HR ~126 BPM with exertion. Pt would benefit from continued OT skilled services for ADL, mobility and energy conservation. OT following acutely.     Follow Up Recommendations  CIR    Equipment Recommendations  3 in 1 bedside commode    Recommendations for Other Services       Precautions / Restrictions Precautions Precautions: Fall Restrictions Weight Bearing Restrictions: No      Mobility Bed Mobility               General bed mobility comments: OOB in recliner pre and post session  Transfers Overall transfer level: Needs assistance Equipment used: Rolling walker (2 wheeled) Transfers: Sit to/from [facility] Sit to Stand: Min assist Stand pivot transfers: Min guard       General transfer comment: Pt minA for intitial standing balance; minguardA overall for stability with RW and IV pole/lines    Balance Overall balance assessment: Needs assistance Sitting-balance support: Feet supported Sitting balance-Leahy Scale: Fair     Standing balance support: Bilateral upper extremity supported Standing balance-Leahy Scale: Poor Standing balance comment: RW for support                           ADL  either performed or assessed with clinical judgement   ADL Overall ADL's : Needs assistance/impaired Eating/Feeding: Modified independent;Sitting   Grooming: Supervision/safety;Standing   Upper Body Bathing: Supervision/ safety;Standing   Lower Body Bathing: Moderate assistance;Cueing for safety;Sitting/lateral leans;Sit to/from stand   Upper Body Dressing : Minimal assistance;Standing   Lower Body Dressing: Moderate assistance;Cueing for safety;Sitting/lateral leans;Sit to/from stand   Toilet Transfer: Minimal assistance;Ambulation;RW;Grab bars   Toileting- Clothing Manipulation and Hygiene: Minimal assistance;Cueing for safety;Sitting/lateral lean;Sit to/from stand Toileting - Clothing Manipulation Details (indicate cue type and reason): pt standing at commode for anterior and posterior pericare     Functional mobility during ADLs: Min guard;Rolling walker;Cueing for safety General ADL Comments: Pt limited by decreased strength, decreased activity tolerance and decreased ability to care for self.     Vision Baseline Vision/History: No visual deficits Patient Visual Report: No change from baseline Vision Assessment?: No apparent visual deficits     Perception     Praxis      Pertinent Vitals/Pain Pain Assessment: No/denies pain Pain Intervention(s): Monitored during session     Hand Dominance Right   Extremity/Trunk Assessment Upper Extremity Assessment Upper Extremity Assessment: Overall WFL for tasks assessed   Lower Extremity Assessment Lower Extremity Assessment: Overall WFL for tasks assessed   Cervical / Trunk Assessment Cervical / Trunk Assessment: Kyphotic   Communication Communication Communication: No difficulties   Cognition Arousal/Alertness: Awake/alert Behavior During Therapy: WFL for tasks assessed/performed Overall Cognitive Status: Within Functional Limits for tasks assessed  General  Comments  O2 on 2L >97% HR 106-128 BPM with exertion    Exercises     Shoulder Instructions      Home Living Family/patient expects to be discharged to:: Private residence Living Arrangements: Alone Available Help at Discharge: Family;Available 24 hours/day Type of Home: House Home Access: Level entry     Home Layout: One level     Bathroom Shower/Tub: Teacher, early years/pre: Standard     Home Equipment: Environmental consultant - 2 wheels   Additional Comments: pt typically sleeps in recliner due to WOB      Prior Functioning/Environment Level of Independence: Independent with assistive device(s)        Comments: works at an [employer] resources [employer]. Lives alone brother lives an hour away, doesn't work and states pt can stay with him        OT Problem List: Decreased strength;Decreased activity tolerance;Impaired balance (sitting and/or standing);Pain;Decreased safety awareness;Cardiopulmonary status limiting activity;Increased edema      OT Treatment/Interventions: Self-care/ADL training;Therapeutic exercise;Energy conservation;DME and/or AE instruction;Therapeutic activities;Patient/family education;Balance training    OT Goals(Current goals can be found in the care plan section) Acute Rehab OT Goals Patient Stated Goal: return to work and home OT Goal Formulation: With patient Time For Goal Achievement: 12/10/19 Potential to Achieve Goals: Good ADL Goals Pt Will Perform Grooming: with set-up;standing Pt Will Perform Lower Body Dressing: with supervision;sit to/from stand Additional ADL Goal #1: pt will increase to supervisionA for OOB ADL in order to increase endurance and increase ability to go home. Additional ADL Goal #2: Pt will recall/utilize (3) energy conservation techniques for OOB ADL and mobility in order to increase safety and endurance.  OT Frequency: Min 2X/week   Barriers to D/C:            Co-evaluation              AM-PAC OT "6 Clicks" Daily  Activity     Outcome Measure Help from another person eating meals?: None Help from another person taking care of personal grooming?: A Little Help from another person toileting, which includes using toliet, bedpan, or urinal?: A Little Help from another person bathing (including washing, rinsing, drying)?: A Lot Help from another person to put on and taking off regular upper body clothing?: A Little Help from another person to put on and taking off regular lower body clothing?: A Lot 6 Click Score: 17   End of Session Equipment Utilized During Treatment: Rolling walker;Oxygen Nurse Communication: Mobility status  Activity Tolerance: Patient tolerated treatment well Patient left: in chair;with call bell/phone within reach  OT Visit Diagnosis: Unsteadiness on feet (R26.81);Muscle weakness (generalized) (M62.81)                Time: 6222-9798 OT Time Calculation (min): 35 min Charges:  OT General Charges $OT Visit: 1 Visit OT Evaluation $OT Eval Moderate Complexity: 1 Mod OT Treatments $Self Care/Home Management : 8-22 mins  Jefferey Pica, OTR/L Acute Rehabilitation Services Pager: [phone] Office: [phone]   Clydell Alberts C 11/26/2019, 3:47 PM

## 2019-11-27 LAB — GLUCOSE, CAPILLARY
Glucose-Capillary: 148 mg/dL — ABNORMAL HIGH (ref 70–99)
Glucose-Capillary: 182 mg/dL — ABNORMAL HIGH (ref 70–99)
Glucose-Capillary: 188 mg/dL — ABNORMAL HIGH (ref 70–99)
Glucose-Capillary: 123 mg/dL — ABNORMAL HIGH (ref 70–99)

## 2019-11-27 LAB — RENAL FUNCTION PANEL
Albumin: 2.6 g/dL — ABNORMAL LOW (ref 3.5–5.0)
Albumin: 2.7 g/dL — ABNORMAL LOW (ref 3.5–5.0)
Anion gap: 9 (ref 5–15)
Anion gap: 10 (ref 5–15)
BUN: 20 mg/dL (ref 8–23)
BUN: 25 mg/dL — ABNORMAL HIGH (ref 8–23)
CO2: 23 mmol/L (ref 22–32)
CO2: 24 mmol/L (ref 22–32)
Calcium: 8.7 mg/dL — ABNORMAL LOW (ref 8.9–10.3)
Calcium: 8.6 mg/dL — ABNORMAL LOW (ref 8.9–10.3)
Chloride: 103 mmol/L (ref 98–111)
Chloride: 101 mmol/L (ref 98–111)
Creatinine, Ser: 2.69 mg/dL — ABNORMAL HIGH (ref 0.44–1.00)
Creatinine, Ser: 2.7 mg/dL — ABNORMAL HIGH (ref 0.44–1.00)
GFR calc Af Amer: 20 mL/min — ABNORMAL LOW (ref >60)
GFR calc Af Amer: 20 mL/min — ABNORMAL LOW (ref >60)
GFR calc non Af Amer: 17 mL/min — ABNORMAL LOW (ref >60)
GFR calc non Af Amer: 17 mL/min — ABNORMAL LOW (ref >60)
Glucose, Bld: 167 mg/dL — ABNORMAL HIGH (ref 70–99)
Glucose, Bld: 129 mg/dL — ABNORMAL HIGH (ref 70–99)
Phosphorus: 2 mg/dL — ABNORMAL LOW (ref 2.5–4.6)
Phosphorus: 2 mg/dL — ABNORMAL LOW (ref 2.5–4.6)
Potassium: 3.9 mmol/L (ref 3.5–5.1)
Potassium: 4.1 mmol/L (ref 3.5–5.1)
Sodium: 135 mmol/L (ref 135–145)
Sodium: 135 mmol/L (ref 135–145)

## 2019-11-27 LAB — HEPARIN LEVEL (UNFRACTIONATED)
Heparin Unfractionated: 0.21 IU/mL — ABNORMAL LOW (ref 0.30–0.70)
Heparin Unfractionated: 0.15 IU/mL — ABNORMAL LOW (ref 0.30–0.70)

## 2019-11-27 LAB — CBC
HCT: 26.8 % — ABNORMAL LOW (ref 36.0–46.0)
Hemoglobin: 8.6 g/dL — ABNORMAL LOW (ref 12.0–15.0)
MCH: 31.9 pg (ref 26.0–34.0)
MCHC: 32.1 g/dL (ref 30.0–36.0)
MCV: 99.3 fL (ref 80.0–100.0)
Platelets: 110 10*3/uL — ABNORMAL LOW (ref 150–400)
RBC: 2.7 MIL/uL — ABNORMAL LOW (ref 3.87–5.11)
RDW: 14.8 % (ref 11.5–15.5)
WBC: 8.1 10*3/uL (ref 4.0–10.5)
nRBC: 0.9 % — ABNORMAL HIGH (ref 0.0–0.2)

## 2019-11-27 LAB — MAGNESIUM: Magnesium: 2 mg/dL (ref 1.7–2.4)

## 2019-11-27 LAB — APTT: aPTT: 35 seconds (ref 24–36)

## 2019-11-27 MED ORDER — IPRATROPIUM-ALBUTEROL 0.5-2.5 (3) MG/3ML IN SOLN: 3.0000 mL | Freq: Four times a day (QID) | RESPIRATORY_TRACT | Status: DC

## 2019-11-27 MED ORDER — GUAIFENESIN ER 600 MG PO TB12
600.0000 mg | ORAL_TABLET | Freq: Two times a day (BID) | ORAL | Status: DC
  Administered 2019-11-27 – 2019-11-29 (×5): 600 mg via ORAL
  Filled 2019-11-27 (×5): qty 1

## 2019-11-27 NOTE — Progress Notes (Signed)
PROGRESS NOTE    Paula Wheeler  EQA:[id number] DOB: 1951-02-23 DOA: 11/23/2019 PCP: Hulan Fess, MD   Brief Narrative: 69 year old with past medical history significant for CKD stage IV, diabetes,  hypertension who presents with generalized abdominal pain, lower extremity pain, nausea and vomiting.  Patient has not been able to tolerate any oral intake.  She has a history of chronic abdominal pain but worse recently mainly over the left lower side.  Lower extremity pain also chronic due to claudication but worse over the past 2 days.  In the ED patient was found to be hypotensive, multiple metabolic derangement including AKI, profound lactic acidosis.  Concern for DKA renal failure.  CTA abdomen and pelvis and CTA aorto bifemoral showed chronic PAD but no evidence of acute ischemia.  Presentation consistent with DKA, AKI and Metformin induced lactic acidosis.  Nose with acute hypoxic respiratory failure secondary to lower lobe pneumonia.  She require CRRT for AKI.  She was also treated with insulin drip for DKA.   Assessment & Plan:   Active Problems:   Acute renal failure (ARF) (HCC)  1-Acute hypoxic respiratory failure, secondary to lower lobe PNA;  -Patient was transiently on IV pressors. -She received 5 days of cefepime and vancomycin.  -Plan to complete 2 more days of ceftriaxone. - incentive spirometry  2-DKA; Diabetes type 2;  She was on insulin drip.  Received fluids.  Transition to Levemir 6 units twice daily.  A sliding scale insulin  3-AKI on CKD stage:  Patient presented with a creatinine of 10, BUN 75, CO2 less than 7, hyperkalemia potassium 5.7. Treated with CRRT.  Nephrology following in regards needs for HD>  Plan to monitor urine output and creatinine level see if she will require future hemodialysis  4-A fib, HTN, HLD;  On heparin drip, awaiting recommendation for permanent hemodialysis catheter  5-Lactic acidosis: Secondary to Metformin, DKA, infectious  process. Resolved.  6-Circulatory shock; versus septic shock: Patient presented with metabolic acidosis, pneumonia, leukocytosis white blood cell 23. Sepsis ruled in She required IV pressors for 24 hours. Off of pressor.  Treated with IV antibiotics.   7-Abdominal pain, lower extremity pain: No evidence of ischemic bowel or or ischemic limb although has severe PAD. Wound care per vascular. Further recommendation per vascular team.  Probably COPD Nebulizer treatment Nutrition Problem: Increased nutrient needs Etiology: acute illness (AKI requiring CRRT)    Signs/Symptoms: estimated needs    Interventions: Ensure Enlive (each supplement provides 350kcal and 20 grams of protein), MVI  Estimated body mass index is 35.81 kg/m as calculated from the following:   Height as of this encounter: 5\' 5"  (1.651 m).   Weight as of this encounter: 97.6 kg.   DVT prophylaxis: Heparin drip Code Status: Full code Family Communication: Brother who was at bedside Disposition Plan:  Status is: Inpatient  Remains inpatient appropriate because:Hemodynamically unstable   Dispo: The patient is from: Home              Anticipated d/c is to: To be determined              Anticipated d/c date is: 3 days              Patient currently is not medically stable to d/c.  Need to determine if patient will require hemodialysis at discharge        Consultants:  Nephrology CM admitted patient  Procedures:  7/27 > admit.CRRT HD cath complete 7/27 >   Antimicrobials:  Resistant  hypertension days of IV antibiotics, cefepime and vancomycin Needs today of ceftriaxone  Subjective: Patient is alert, she is still having some cough, breathing better.  Objective: Vitals:   11/26/19 2014 11/26/19 2053 11/27/19 0018 11/27/19 0434  BP: (!) 124/94  125/68 124/77  Pulse: 91  93 92  Resp: 19  16 19   Temp: 98.2 F (36.8 C)  98.3 F (36.8 C) 99 F (37.2 C)  TempSrc: Oral   Oral  SpO2: 98% 96%  96% 96%  Weight:      Height:        Intake/Output Summary (Last 24 hours) at 11/27/2019 0711 Last data filed at 11/27/2019 0434 Gross per 24 hour  Intake 2038.53 ml  Output 1350 ml  Net 688.53 ml   Filed Weights   11/24/19 0414 11/25/19 0420 11/26/19 0500  Weight: (!) 99.3 kg (!) 98.8 kg (!) 97.6 kg    Examination:  General exam: Appears calm and comfortable  Respiratory system: Bilateral crackles, respiratory effort normal. Cardiovascular system: S1 & S2 heard, RRR. No JVD, murmurs, rubs, gallops or clicks. No pedal edema. Gastrointestinal system: Abdomen is nondistended, soft and nontender. No organomegaly or masses felt. Normal bowel sounds heard. Central nervous system: Alert and oriented. No focal neurological deficits. Extremities: Symmetric 5 x 5 power.   Data Reviewed: I have personally reviewed following labs and imaging studies  CBC: Recent Labs  Lab 11/23/19 0147 11/23/19 0203 11/23/19 0218 11/23/19 0444 11/24/19 0817  WBC 20.1*  --   --  23.3* 17.1*  NEUTROABS 16.8*  --   --   --   --   HGB 10.3* 12.2 11.9* 9.4* 9.1*  HCT 36.0 36.0 35.0* 32.4* 27.0*  MCV 111.8*  --   --  107.6* 94.4  PLT 302  --   --  258 923   Basic Metabolic Panel: Recent Labs  Lab 11/23/19 0444 11/23/19 0939 11/24/19 0436 11/24/19 0817 11/24/19 1644 11/24/19 1644 11/25/19 0308 11/25/19 1029 11/26/19 0145 11/26/19 1447 11/27/19 0425  NA 135   < > 135   < > 135   < > 135 133* 135 133* 135  K 4.4   < > 5.0   < > 4.5   < > 4.4 4.0 3.9 3.9 3.9  CL 95*   < > 99   < > 100   < > 101 100 102 100 103  CO2 <7*   < > 18*   < > 20*   < > 23 18* 24 23 23   GLUCOSE 289*   < > 148*   < > 191*   < > 162* 186* 173* 181* 167*  BUN 74*   < > 31*   < > 18   < > 11 10 14 17 20   CREATININE 9.68*   < > 3.58*   < > 2.33*   < > 1.71* 1.68* 2.31* 2.61* 2.69*  CALCIUM 7.0*   < > 8.0*   < > 8.1*   < > 8.4* 8.3* 8.5* 8.4* 8.7*  MG 2.1  --  2.1  --   --   --  2.4  --  2.4  --  2.0  PHOS >30.0*   < >  3.7   < > 2.8  --  2.1*  --  1.9* 1.5* 2.0*   < > = values in this interval not displayed.   GFR: Estimated Creatinine Clearance: 22.8 mL/min (A) (by C-G formula based on SCr of 2.69 mg/dL (H)). Liver  Function Tests: Recent Labs  Lab 11/23/19 0147 11/23/19 1605 11/24/19 1644 11/25/19 0308 11/26/19 0145 11/26/19 1447 11/27/19 0425  AST 17  --   --   --   --   --   --   ALT 7  --   --   --   --   --   --   ALKPHOS 85  --   --   --   --   --   --   BILITOT 0.8  --   --   --   --   --   --   PROT 6.1*  --   --   --   --   --   --   ALBUMIN 3.2*   < > 3.1* 3.1* 2.7* 2.8* 2.6*   < > = values in this interval not displayed.   No results for input(s): LIPASE, AMYLASE in the last 168 hours. No results for input(s): AMMONIA in the last 168 hours. Coagulation Profile: Recent Labs  Lab 11/23/19 0147  INR 2.2*   Cardiac Enzymes: Recent Labs  Lab 11/23/19 0939  CKTOTAL 850*   BNP (last 3 results) No results for input(s): PROBNP in the last 8760 hours. HbA1C: No results for input(s): HGBA1C in the last 72 hours. CBG: Recent Labs  Lab 11/26/19 1304 11/26/19 1437 11/26/19 1532 11/26/19 2018 11/27/19 0637  GLUCAP 172* 157* 165* 177* 148*   Lipid Profile: No results for input(s): CHOL, HDL, LDLCALC, TRIG, CHOLHDL, LDLDIRECT in the last 72 hours. Thyroid Function Tests: No results for input(s): TSH, T4TOTAL, FREET4, T3FREE, THYROIDAB in the last 72 hours. Anemia Panel: No results for input(s): VITAMINB12, FOLATE, FERRITIN, TIBC, IRON, RETICCTPCT in the last 72 hours. Sepsis Labs: Recent Labs  Lab 11/23/19 1331 11/24/19 0436 11/24/19 1210 11/25/19 0308  LATICACIDVEN 10.5* 6.6* 3.9* 1.8    Recent Results (from the past 240 hour(s))  Urine culture     Status: None   Collection Time: 11/23/19  2:39 AM   Specimen: In/Out Cath Urine  Result Value Ref Range Status   Specimen Description IN/OUT CATH URINE  Final   Special Requests NONE  Final   Culture   Final    NO  GROWTH Performed at [facility], [place] [address] [place], [place] [address]    Report Status 11/24/2019 FINAL  Final  Blood Culture (routine x 2)     Status: None (Preliminary result)   Collection Time: 11/23/19  3:07 AM   Specimen: BLOOD  Result Value Ref Range Status   Specimen Description BLOOD RIGHT ANTECUBITAL  Final   Special Requests   Final    BOTTLES DRAWN AEROBIC AND ANAEROBIC Blood Culture adequate volume   Culture   Final    NO GROWTH 3 DAYS Performed at [facility], [place] [address] [place], [place] [address]    Report Status PENDING  Incomplete  SARS Coronavirus 2 by RT PCR (hospital order, performed in [facility] hospital lab) Nasopharyngeal Nasopharyngeal Swab     Status: None   Collection Time: 11/23/19  4:02 AM   Specimen: Nasopharyngeal Swab  Result Value Ref Range Status   SARS Coronavirus 2 NEGATIVE NEGATIVE Final    Comment: (NOTE) SARS-CoV-2 target nucleic acids are NOT DETECTED.  The SARS-CoV-2 RNA is generally detectable in upper and lower respiratory specimens during the acute phase of infection. The lowest concentration of SARS-CoV-2 viral copies this assay can detect is 250 copies / mL. A negative  result does not preclude SARS-CoV-2 infection and should not be used as the sole basis for treatment or other patient management decisions.  A negative result may occur with improper specimen collection / handling, submission of specimen other than nasopharyngeal swab, presence of viral mutation(s) within the areas targeted by this assay, and inadequate number of viral copies (<250 copies / mL). A negative result must be combined with clinical observations, patient history, and epidemiological information.  Fact Sheet for Patients:   StrictlyIdeas.no  Fact Sheet for Healthcare Providers: BankingDealers.co.za  This test is not yet approved or  cleared by the [place] FDA and has been  authorized for detection and/or diagnosis of SARS-CoV-2 by FDA under an Emergency Use Authorization (EUA).  This EUA will remain in effect (meaning this test can be used) for the duration of the COVID-19 declaration under Section 564(b)(1) of the Act, 21 U.S.C. section 360bbb-3(b)(1), unless the authorization is terminated or revoked sooner.  Performed at [facility], [place] [address] [place], [place] [address]   Culture, blood (Routine X 2) w Reflex to ID Panel     Status: None (Preliminary result)   Collection Time: 11/23/19  4:03 AM   Specimen: BLOOD RIGHT FOREARM  Result Value Ref Range Status   Specimen Description BLOOD RIGHT FOREARM  Final   Special Requests   Final    BOTTLES DRAWN AEROBIC AND ANAEROBIC Blood Culture adequate volume   Culture   Final    NO GROWTH 3 DAYS Performed at [facility], [place] [address] [place], [place] [address]    Report Status PENDING  Incomplete  MRSA PCR Screening     Status: None   Collection Time: 11/23/19 10:26 AM   Specimen: Nasopharyngeal  Result Value Ref Range Status   MRSA by PCR NEGATIVE NEGATIVE Final    Comment:        The GeneXpert MRSA Assay (FDA approved for NASAL specimens only), is one component of a comprehensive MRSA colonization surveillance program. It is not intended to diagnose MRSA infection nor to guide or monitor treatment for MRSA infections. Performed at [facility], [place] [address] [place], [place] [address]          Radiology Studies: No results found.      Scheduled Meds: . B-complex with vitamin C  1 tablet Oral Daily  . Chlorhexidine Gluconate Cloth  6 each Topical Daily  . feeding supplement (ENSURE ENLIVE)  237 mL Oral BID BM  . insulin aspart  0-9 Units Subcutaneous TID WC  . insulin detemir  6 Units Subcutaneous BID  . sodium chloride flush  10-40 mL Intracatheter Q12H   Continuous Infusions: . sodium chloride Stopped (11/24/19 0605)  . cefTRIAXone (ROCEPHIN)  IV  Stopped (11/26/19 1519)  . heparin 800 Units/hr (11/27/19 0102)     LOS: 4 days    Time spent: 35 minutes    Takoda Siedlecki A Oriyah Lamphear, MD [facility]   If 7PM-7AM, please contact night-coverage [email or web address]  11/27/2019, 7:11 AM

## 2019-11-27 NOTE — Progress Notes (Signed)
ANTICOAGULATION CONSULT NOTE - Follow Up Consult  Pharmacy Consult for heparin while Eliquis on hold Indication: atrial fibrillation  Allergies  Allergen Reactions  . Nsaids Swelling    Hands and legs swell  . Penicillins Hives    Has patient had a PCN reaction causing immediate rash, facial/tongue/throat swelling, SOB or lightheadedness with hypotension: Yes Has patient had a PCN reaction causing severe rash involving mucus membranes or skin necrosis: No Has patient had a PCN reaction that required hospitalization: No Has patient had a PCN reaction occurring within the last 10 years: No If all of the above answers are "NO", then may proceed with Cephalosporin use.     Patient Measurements: Height: 5\' 5"  (165.1 cm) Weight: (!) 97.6 kg (215 lb 2.7 oz) IBW/kg (Calculated) : 57 Heparin Dosing Weight: 97.6  Vital Signs: Temp: 98.7 F (37.1 C) (07/31 1656) Temp Source: Oral (07/31 1656) BP: 135/84 (07/31 1656) Pulse Rate: 87 (07/31 1656)  Labs: Recent Labs    11/25/19 1227 11/25/19 1227 11/26/19 0145 11/26/19 0145 11/26/19 1447 11/27/19 0425 11/27/19 1107 11/27/19 1955  HGB  --   --   --   --   --   --  8.6*  --   HCT  --   --   --   --   --   --  26.8*  --   PLT  --   --   --   --   --   --  110*  --   APTT 43*  --  40*  --   --  35  --   --   HEPARINUNFRC 0.41   < > 0.32  --   --  0.21*  --  0.15*  CREATININE  --    < > 2.31*   < > 2.61* 2.69*  --  2.70*   < > = values in this interval not displayed.    Estimated Creatinine Clearance: 22.7 mL/min (A) (by C-G formula based on SCr of 2.7 mg/dL (H)).   Medications:  Scheduled:  . B-complex with vitamin C  1 tablet Oral Daily  . Chlorhexidine Gluconate Cloth  6 each Topical Daily  . feeding supplement (ENSURE ENLIVE)  237 mL Oral BID BM  . guaiFENesin  600 mg Oral BID  . insulin aspart  0-9 Units Subcutaneous TID WC  . insulin detemir  6 Units Subcutaneous BID  . sodium chloride flush  10-40 mL Intracatheter  Q12H    Assessment: 69 YO female with history of atrial fibrillation to start heparin while Eliquis on hold pending possible procedures.  Last dose of Eliquis was 7/26 ~8pm.  She presented with DKA and AKI w/ metformin induced lactic acidosis.  CTA showed chronic PAD, however vascular surgery said patient was not a candidate for revascularization and to manage with wound care.    Heparin level trending down despite rate increase.  Goal of Therapy:  Heparin level 0.3-0.7 units/ml Monitor platelets by anticoagulation protocol: Yes   Plan:  Increase heparin to 1150 units/h Recheck heparin level with am labs   Arrie Senate, PharmD, BCPS Clinical Pharmacist [phone] Please check AMION for all [facility] Pharmacy numbers 11/27/2019

## 2019-11-27 NOTE — Progress Notes (Signed)
ANTICOAGULATION CONSULT NOTE - Follow Up Consult  Pharmacy Consult for heparin while Eliquis on hold Indication: atrial fibrillation  Allergies  Allergen Reactions  . Nsaids Swelling    Hands and legs swell  . Penicillins Hives    Has patient had a PCN reaction causing immediate rash, facial/tongue/throat swelling, SOB or lightheadedness with hypotension: Yes Has patient had a PCN reaction causing severe rash involving mucus membranes or skin necrosis: No Has patient had a PCN reaction that required hospitalization: No Has patient had a PCN reaction occurring within the last 10 years: No If all of the above answers are "NO", then may proceed with Cephalosporin use.     Patient Measurements: Height: 5\' 5"  (165.1 cm) Weight: (!) 97.6 kg (215 lb 2.7 oz) IBW/kg (Calculated) : 57 Heparin Dosing Weight: 97.6  Vital Signs: Temp: 97.9 F (36.6 C) (07/31 1007) Temp Source: Oral (07/31 1007) BP: 132/73 (07/31 1007) Pulse Rate: 83 (07/31 1007)  Labs: Recent Labs    11/25/19 1029 11/25/19 1227 11/26/19 0145 11/26/19 1447 11/27/19 0425  APTT  --  43* 40*  --  35  HEPARINUNFRC  --  0.41 0.32  --  0.21*  CREATININE   < >  --  2.31* 2.61* 2.69*   < > = values in this interval not displayed.    Estimated Creatinine Clearance: 22.8 mL/min (A) (by C-G formula based on SCr of 2.69 mg/dL (H)).   Medications:  Scheduled:  . B-complex with vitamin C  1 tablet Oral Daily  . Chlorhexidine Gluconate Cloth  6 each Topical Daily  . feeding supplement (ENSURE ENLIVE)  237 mL Oral BID BM  . guaiFENesin  600 mg Oral BID  . insulin aspart  0-9 Units Subcutaneous TID WC  . insulin detemir  6 Units Subcutaneous BID  . sodium chloride flush  10-40 mL Intracatheter Q12H    Assessment: 69 YO female with history of atrial fibrillation to start heparin while Eliquis on hold pending possible procedures.  Last dose of Eliquis was 7/26 ~8pm.  She presented with DKA and AKI w/ metformin induced  lactic acidosis.  CTA showed chronic PAD, however vascular surgery said patient was not a candidate for revascularization and to manage with wound care.    Patient was initiated on heparin gtt at 1200 units/hr which was decreased to 800 units/hr on 7/28.    Heparin level 0.21 today which is subtherapeutic, CBC stable.  Previous heparin levels have been at goal but downtrending.  Will increase gtt to 950 units/hr (~2 units/kg/hr increase).    Goal of Therapy:  Heparin level 0.3-0.7 units/ml Monitor platelets by anticoagulation protocol: Yes   Plan:  Increase heparin gtt to 950 units/hr Check 8 hour HL Monitor for signs and symptoms of bleeding F/u Eliquis restart  Dimple Nanas, PharmD PGY-1 Acute Care Pharmacy Resident 11/27/2019 11:36 AM

## 2019-11-27 NOTE — Progress Notes (Signed)
   VASCULAR SURGERY ASSESSMENT & PLAN:   PERIPHERAL VASCULAR DISEASE: Now that she is off all pressors she has good Doppler signals in both feet.  She has no significant wounds only some blistering.  Vascular surgery will follow peripherally.   SUBJECTIVE:   No specific complaints.  PHYSICAL EXAM:   Vitals:   11/26/19 2014 11/26/19 2053 11/27/19 0018 11/27/19 0434  BP: (!) 124/94  125/68 124/77  Pulse: 91  93 92  Resp: 19  16 19   Temp: 98.2 F (36.8 C)  98.3 F (36.8 C) 99 F (37.2 C)  TempSrc: Oral   Oral  SpO2: 98% 96% 96% 96%  Weight:      Height:       Brisk posterior tibial signals bilaterally.  Monophasic dorsalis pedis signals. Some blisters on legs bilaterally.  LABS:   CBG (last 3)  Recent Labs    11/26/19 1437 11/26/19 1532 11/26/19 2018  GLUCAP 157* 165* 177*    PROBLEM LIST:    Active Problems:   Acute renal failure (ARF) (HCC)   CURRENT MEDS:   . B-complex with vitamin C  1 tablet Oral Daily  . Chlorhexidine Gluconate Cloth  6 each Topical Daily  . feeding supplement (ENSURE ENLIVE)  237 mL Oral BID BM  . insulin aspart  0-9 Units Subcutaneous TID WC  . insulin detemir  6 Units Subcutaneous BID  . sodium chloride flush  10-40 mL Intracatheter Q12H    Deitra Mayo Office: [phone] 11/27/2019

## 2019-11-27 NOTE — Progress Notes (Signed)
Admit: 11/23/2019 LOS: 4  56F AoCKD with severe metabolic acidosis, lactic and ketoacidosis  Subjective:  Marland Kitchen Very little change in creatinine over the past 24 hours, electrolytes are stable . 1.4 L urine output . Feels good, no complaints . Still has temporary HD catheter . She says she plans to stop smoking immediately  07/30 0701 - 07/31 0700 In: 2038.5 [P.O.:1100; I.V.:738.7; IV Piggyback:199.9] Out: 1350 [Urine:1350]  Filed Weights   11/24/19 0414 11/25/19 0420 11/26/19 0500  Weight: (!) 99.3 kg (!) 98.8 kg (!) 97.6 kg    Scheduled Meds: . B-complex with vitamin C  1 tablet Oral Daily  . Chlorhexidine Gluconate Cloth  6 each Topical Daily  . feeding supplement (ENSURE ENLIVE)  237 mL Oral BID BM  . guaiFENesin  600 mg Oral BID  . insulin aspart  0-9 Units Subcutaneous TID WC  . insulin detemir  6 Units Subcutaneous BID  . ipratropium-albuterol  3 mL Nebulization Q6H  . sodium chloride flush  10-40 mL Intracatheter Q12H   Continuous Infusions: . sodium chloride Stopped (11/24/19 0605)  . cefTRIAXone (ROCEPHIN)  IV Stopped (11/26/19 1519)  . heparin 800 Units/hr (11/27/19 0102)   PRN Meds:.sodium chloride, albuterol, dextrose, docusate sodium, heparin, HYDROmorphone (DILAUDID) injection, phenol, polyethylene glycol, sodium chloride flush  Current Labs: reviewed    Physical Exam:  Blood pressure (!) 132/73, pulse 83, temperature 97.9 F (36.6 C), temperature source Oral, resp. rate 18, height 5\' 5"  (1.651 m), weight (!) 97.6 kg, SpO2 100 %. NAD, hoarse NCAT RRR Coarse BS b/l S/nt/nd  R IJ Temp Cath bandaged Mottled cold feet    A 1. Resolving dialysis dependent AoCKD4;  1. CRRT 7/27 - 7/29 2. BL SCr 2.6 08/2019 3. Follows with Hollie Salk at [facility] 4. Change in labs and increased urine output over the past 24 hours are very favorable, no dialysis needed today, will monitor for another 24 hours  5. still has temp HD cath, potential removal tomorrow 2. Shock, improved  off pressors 3. PNA, improving, ceftriaxone, per TRH 4. Resolved severe AG metabolic acidosis, inc lactate +BHB: ? DKA ? Metformin toxicity; on metformin at admit; improved.   5. Hyperkalemia, resolved 6. Hyperphosphatemia, stable, borderline low, continue to monitor 7. DM2, hyperglycemia 8. Severe PVD, VVS is followed, no acute issues 9. COPD 10. Anemia, hb 9s 11. Ongoing T user, patient plans to stop as of now  P . As above, reassess tomorrow, no current HD needs . Medication Issues; o Preferred narcotic agents for pain control are hydromorphone, fentanyl, and methadone. Morphine should not be used.  o Baclofen should be avoided o Avoid oral sodium phosphate and magnesium citrate based laxatives / bowel preps    Pearson Grippe MD 11/27/2019, 10:35 AM  Recent Labs  Lab 11/26/19 0145 11/26/19 1447 11/27/19 0425  NA 135 133* 135  K 3.9 3.9 3.9  CL 102 100 103  CO2 24 23 23   GLUCOSE 173* 181* 167*  BUN 14 17 20   CREATININE 2.31* 2.61* 2.69*  CALCIUM 8.5* 8.4* 8.7*  PHOS 1.9* 1.5* 2.0*   Recent Labs  Lab 11/23/19 0147 11/23/19 0203 11/23/19 0218 11/23/19 0444 11/24/19 0817  WBC 20.1*  --   --  23.3* 17.1*  NEUTROABS 16.8*  --   --   --   --   HGB 10.3*   < > 11.9* 9.4* 9.1*  HCT 36.0   < > 35.0* 32.4* 27.0*  MCV 111.8*  --   --  107.6* 94.4  PLT 302  --   --  258 196   < > = values in this interval not displayed.

## 2019-11-27 NOTE — Plan of Care (Signed)
  Problem: Education: Goal: Knowledge of General Education information will improve Description Including pain rating scale, medication(s)/side effects and non-pharmacologic comfort measures Outcome: Progressing   

## 2019-11-28 DIAGNOSIS — L8990 Pressure ulcer of unspecified site, unspecified stage: Secondary | ICD-10-CM | POA: Insufficient documentation

## 2019-11-28 LAB — GLUCOSE, CAPILLARY
Glucose-Capillary: 173 mg/dL — ABNORMAL HIGH (ref 70–99)
Glucose-Capillary: 189 mg/dL — ABNORMAL HIGH (ref 70–99)
Glucose-Capillary: 149 mg/dL — ABNORMAL HIGH (ref 70–99)
Glucose-Capillary: 134 mg/dL — ABNORMAL HIGH (ref 70–99)

## 2019-11-28 LAB — IRON AND TIBC
Iron: 30 ug/dL (ref 28–170)
Saturation Ratios: 9 % — ABNORMAL LOW (ref 10.4–31.8)
TIBC: 330 ug/dL (ref 250–450)
UIBC: 300 ug/dL

## 2019-11-28 LAB — RENAL FUNCTION PANEL
Albumin: 2.6 g/dL — ABNORMAL LOW (ref 3.5–5.0)
Anion gap: 9 (ref 5–15)
BUN: 26 mg/dL — ABNORMAL HIGH (ref 8–23)
CO2: 25 mmol/L (ref 22–32)
Calcium: 8.7 mg/dL — ABNORMAL LOW (ref 8.9–10.3)
Chloride: 104 mmol/L (ref 98–111)
Creatinine, Ser: 2.63 mg/dL — ABNORMAL HIGH (ref 0.44–1.00)
GFR calc Af Amer: 21 mL/min — ABNORMAL LOW (ref >60)
GFR calc non Af Amer: 18 mL/min — ABNORMAL LOW (ref >60)
Glucose, Bld: 131 mg/dL — ABNORMAL HIGH (ref 70–99)
Phosphorus: 2.3 mg/dL — ABNORMAL LOW (ref 2.5–4.6)
Potassium: 4 mmol/L (ref 3.5–5.1)
Sodium: 138 mmol/L (ref 135–145)

## 2019-11-28 LAB — APTT: aPTT: 45 seconds — ABNORMAL HIGH (ref 24–36)

## 2019-11-28 LAB — RETICULOCYTES
Immature Retic Fract: 16.5 % — ABNORMAL HIGH (ref 2.3–15.9)
RBC.: 2.71 MIL/uL — ABNORMAL LOW (ref 3.87–5.11)
Retic Count, Absolute: 35.8 10*3/uL (ref 19.0–186.0)
Retic Ct Pct: 1.3 % (ref 0.4–3.1)

## 2019-11-28 LAB — CULTURE, BLOOD (ROUTINE X 2)
Culture: NO GROWTH
Culture: NO GROWTH
Special Requests: ADEQUATE
Special Requests: ADEQUATE

## 2019-11-28 LAB — HEPARIN LEVEL (UNFRACTIONATED): Heparin Unfractionated: 0.19 IU/mL — ABNORMAL LOW (ref 0.30–0.70)

## 2019-11-28 LAB — CBC
HCT: 26.6 % — ABNORMAL LOW (ref 36.0–46.0)
Hemoglobin: 8.5 g/dL — ABNORMAL LOW (ref 12.0–15.0)
MCH: 32.1 pg (ref 26.0–34.0)
MCHC: 32 g/dL (ref 30.0–36.0)
MCV: 100.4 fL — ABNORMAL HIGH (ref 80.0–100.0)
Platelets: 116 10*3/uL — ABNORMAL LOW (ref 150–400)
RBC: 2.65 MIL/uL — ABNORMAL LOW (ref 3.87–5.11)
RDW: 14.8 % (ref 11.5–15.5)
WBC: 9.2 10*3/uL (ref 4.0–10.5)
nRBC: 0.9 % — ABNORMAL HIGH (ref 0.0–0.2)

## 2019-11-28 LAB — VITAMIN B12: Vitamin B-12: 1872 pg/mL — ABNORMAL HIGH (ref 180–914)

## 2019-11-28 LAB — MAGNESIUM: Magnesium: 1.9 mg/dL (ref 1.7–2.4)

## 2019-11-28 LAB — FOLATE: Folate: 5.5 ng/mL — ABNORMAL LOW (ref >5.9)

## 2019-11-28 LAB — FERRITIN: Ferritin: 93 ng/mL (ref 11–307)

## 2019-11-28 MED ORDER — FOLIC ACID 1 MG PO TABS
1.0000 mg | ORAL_TABLET | Freq: Every day | ORAL | Status: DC
  Administered 2019-11-28 – 2019-11-29 (×2): 1 mg via ORAL
  Filled 2019-11-28 (×2): qty 1

## 2019-11-28 MED ORDER — FERROUS SULFATE 325 (65 FE) MG PO TABS
325.0000 mg | ORAL_TABLET | Freq: Every day | ORAL | Status: DC
  Administered 2019-11-28 – 2019-11-29 (×2): 325 mg via ORAL
  Filled 2019-11-28 (×2): qty 1

## 2019-11-28 MED ORDER — APIXABAN 5 MG PO TABS
5.0000 mg | ORAL_TABLET | Freq: Two times a day (BID) | ORAL | Status: DC
  Administered 2019-11-28 – 2019-11-29 (×3): 5 mg via ORAL
  Filled 2019-11-28 (×3): qty 1

## 2019-11-28 MED ORDER — BISOPROLOL FUMARATE 5 MG PO TABS
5.0000 mg | ORAL_TABLET | Freq: Every day | ORAL | Status: DC
  Administered 2019-11-28 – 2019-11-29 (×2): 5 mg via ORAL
  Filled 2019-11-28 (×2): qty 1

## 2019-11-28 NOTE — Progress Notes (Signed)
Admit: 11/23/2019 LOS: 5  86F AoCKD with severe metabolic acidosis, lactic and ketoacidosis  Subjective:  Marland Kitchen Downtrending creatinine, BUN stable, K4.0, bicarbonate 25 . Nearly 2 L of urine . Feels good, out of bed . Still has temporary HD catheter  07/31 0701 - 08/01 0700 In: 880 [P.O.:880] Out: 1950 [Urine:1950]  Filed Weights   11/24/19 0414 11/25/19 0420 11/26/19 0500  Weight: (!) 99.3 kg (!) 98.8 kg (!) 97.6 kg    Scheduled Meds: . B-complex with vitamin C  1 tablet Oral Daily  . bisoprolol  5 mg Oral Daily  . Chlorhexidine Gluconate Cloth  6 each Topical Daily  . feeding supplement (ENSURE ENLIVE)  237 mL Oral BID BM  . guaiFENesin  600 mg Oral BID  . insulin aspart  0-9 Units Subcutaneous TID WC  . insulin detemir  6 Units Subcutaneous BID  . sodium chloride flush  10-40 mL Intracatheter Q12H   Continuous Infusions: . sodium chloride Stopped (11/24/19 0605)  . cefTRIAXone (ROCEPHIN)  IV 1 g (11/27/19 1305)  . heparin 1,400 Units/hr (11/28/19 0815)   PRN Meds:.sodium chloride, albuterol, dextrose, docusate sodium, heparin, HYDROmorphone (DILAUDID) injection, phenol, polyethylene glycol, sodium chloride flush  Current Labs: reviewed    Physical Exam:  Blood pressure (!) 138/68, pulse 86, temperature 98 F (36.7 C), temperature source Oral, resp. rate 18, height 5\' 5"  (1.651 m), weight (!) 97.6 kg, SpO2 100 %. NAD, hoarse NCAT RRR Coarse BS b/l S/nt/nd  R IJ Temp Cath bandaged Mottled cold feet    A 1. Resolving dialysis dependent AoCKD4;  1. CRRT 7/27 - 7/29 2. BL SCr 2.6 08/2019, appears acute issue is resolving to baseline 3. Follows with Hollie Salk at [facility] 4. Remove temporary HD catheter today 2. Shock, improved off pressors 3. PNA, improving, ceftriaxone, per TRH 4. Resolved severe AG metabolic acidosis, inc lactate +BHB: ? DKA ? Metformin toxicity; on metformin at admit, should never be restarted; improved.   5. Hyperkalemia, resolved 6. Hypophosphatemia,  stable, borderline low, continue to monitor 7. DM2, hyperglycemia 8. Severe PVD, VVS is followed, no acute issues 9. COPD 10. Anemia, hb 9s 11. Ongoing T user, patient plans to stop as of now, encouraged  P . Recovering GFR, remove temporary HD catheter . Daily weights, Daily Renal Panel, Strict I/Os, Avoid nephrotoxins (NSAIDs, judicious IV Contrast)    Pearson Grippe MD 11/28/2019, 10:34 AM  Recent Labs  Lab 11/27/19 0425 11/27/19 1955 11/28/19 0426  NA 135 135 138  K 3.9 4.1 4.0  CL 103 101 104  CO2 23 24 25   GLUCOSE 167* 129* 131*  BUN 20 25* 26*  CREATININE 2.69* 2.70* 2.63*  CALCIUM 8.7* 8.6* 8.7*  PHOS 2.0* 2.0* 2.3*   Recent Labs  Lab 11/23/19 0147 11/23/19 0203 11/24/19 0817 11/27/19 1107 11/28/19 0426  WBC 20.1*   < > 17.1* 8.1 9.2  NEUTROABS 16.8*  --   --   --   --   HGB 10.3*   < > 9.1* 8.6* 8.5*  HCT 36.0   < > 27.0* 26.8* 26.6*  MCV 111.8*   < > 94.4 99.3 100.4*  PLT 302   < > 196 110* 116*   < > = values in this interval not displayed.

## 2019-11-28 NOTE — Plan of Care (Signed)
  Problem: Clinical Measurements: Goal: Diagnostic test results will improve Outcome: Progressing   

## 2019-11-28 NOTE — Progress Notes (Signed)
Patient's resting room air saturation was 100%. On ambulation on the hallway ,it dropped to 84 and felt lightheaded.Patient return back to room for safety.Taught the purpose and how to use incentive spirometer.Will monitor.

## 2019-11-28 NOTE — Plan of Care (Signed)
  Problem: Education: Goal: Knowledge of General Education information will improve Description Including pain rating scale, medication(s)/side effects and non-pharmacologic comfort measures Outcome: Progressing   

## 2019-11-28 NOTE — Progress Notes (Signed)
PROGRESS NOTE    Paula Wheeler  KDT:[id number] DOB: 09/19/1950 DOA: 11/23/2019 PCP: Hulan Fess, MD   Brief Narrative: 69 year old with past medical history significant for CKD stage IV, diabetes,  hypertension who presents with generalized abdominal pain, lower extremity pain, nausea and vomiting.  Patient has not been able to tolerate any oral intake.  She has a history of chronic abdominal pain but worse recently mainly over the left lower side.  Lower extremity pain also chronic due to claudication but worse over the past 2 days.  In the ED patient was found to be hypotensive, multiple metabolic derangement including AKI, profound lactic acidosis.  Concern for DKA renal failure.  CTA abdomen and pelvis and CTA aorto bifemoral showed chronic PAD but no evidence of acute ischemia.  Presentation consistent with DKA, AKI and Metformin induced lactic acidosis.  Nose with acute hypoxic respiratory failure secondary to lower lobe pneumonia.  She require CRRT for AKI.  She was also treated with insulin drip for DKA.   Assessment & Plan:   Active Problems:   Acute renal failure (ARF) (HCC)   Pressure injury of skin  1-Acute hypoxic respiratory failure, secondary to lower lobe PNA;  -Patient was transiently on IV pressors. -She received 5 days of cefepime and vancomycin.  -Plan to complete 2 more days of ceftriaxone. - incentive spirometry -evaluate for home oxygen needs.   2-DKA; Diabetes type 2;  She was on insulin drip.  Received fluids.  Transition to Levemir 6 units twice daily.  A sliding scale insulin  3-AKI on CKD stage:  Patient presented with a creatinine of 10, BUN 75, CO2 less than 7, hyperkalemia potassium 5.7. Treated with CRRT.  Nephrology following in regards needs for HD>  HD catheter to be removed today.  Cr down to 2.6. 1.3 L urine out put.   4-A fib, HTN, HLD;  On heparin drip, awaiting recommendation for permanent hemodialysis catheter Plan to transition to  elequis today.   5-Lactic acidosis: Secondary to Metformin, DKA, infectious process. Resolved.  6-Circulatory shock; versus septic shock: Patient presented with metabolic acidosis, pneumonia, leukocytosis white blood cell 23. Sepsis ruled in She required IV pressors for 24 hours. Off of pressor.  Treated with IV antibiotics.   7-Abdominal pain, lower extremity pain: No evidence of ischemic bowel or or ischemic limb although has severe PAD. Wound care per vascular. Further recommendation per vascular team.  Probably COPD Nebulizer treatment  Anemia; low folic acid. Start supplement.  Start iron table.  Hb stable.   Nutrition Problem: Increased nutrient needs Etiology: acute illness (AKI requiring CRRT)    Signs/Symptoms: estimated needs    Interventions: Ensure Enlive (each supplement provides 350kcal and 20 grams of protein), MVI  Estimated body mass index is 35.81 kg/m as calculated from the following:   Height as of this encounter: 5\' 5"  (1.651 m).   Weight as of this encounter: 97.6 kg.   DVT prophylaxis: Heparin drip Code Status: Full code Family Communication: Brother who was at bedside Disposition Plan:  Status is: Inpatient  Remains inpatient appropriate because:Hemodynamically unstable   Dispo: The patient is from: Home              Anticipated d/c is to: To be determined              Anticipated d/c date is: 3 days              Patient currently is not medically stable to d/c.  Need to  determine if patient will require hemodialysis at discharge        Consultants:  Nephrology CM admitted patient  Procedures:  7/27 > admit.CRRT HD cath complete 7/27 >   Antimicrobials:  Resistant hypertension days of IV antibiotics, cefepime and vancomycin Needs today of ceftriaxone  Subjective: She si breathing better, cough improved.  She would like to go home soon.   Objective: Vitals:   11/27/19 1656 11/27/19 2134 11/28/19 0412 11/28/19 0946    BP: (!) 135/84 (!) 129/67 127/69 (!) 138/68  Pulse: 87 (!) 106 (!) 110 86  Resp: 18 18 18 18   Temp: 98.7 F (37.1 C) 98.6 F (37 C) 97.9 F (36.6 C) 98 F (36.7 C)  TempSrc: Oral Oral Oral Oral  SpO2: 99% 99% 98% 100%  Weight:      Height:        Intake/Output Summary (Last 24 hours) at 11/28/2019 1332 Last data filed at 11/28/2019 1200 Gross per 24 hour  Intake 680 ml  Output 1850 ml  Net -1170 ml   Filed Weights   11/24/19 0414 11/25/19 0420 11/26/19 0500  Weight: (!) 99.3 kg (!) 98.8 kg (!) 97.6 kg    Examination:  General exam: NAD Respiratory system: CTA Cardiovascular system: S 1, S 2 RRR Gastrointestinal system: BS present, soft, nt Central nervous system: alert, non focal.  Extremities: Symmetric power   Data Reviewed: I have personally reviewed following labs and imaging studies  CBC: Recent Labs  Lab 11/23/19 0147 11/23/19 0203 11/23/19 0218 11/23/19 0444 11/24/19 0817 11/27/19 1107 11/28/19 0426  WBC 20.1*  --   --  23.3* 17.1* 8.1 9.2  NEUTROABS 16.8*  --   --   --   --   --   --   HGB 10.3*   < > 11.9* 9.4* 9.1* 8.6* 8.5*  HCT 36.0   < > 35.0* 32.4* 27.0* 26.8* 26.6*  MCV 111.8*  --   --  107.6* 94.4 99.3 100.4*  PLT 302  --   --  258 196 110* 116*   < > = values in this interval not displayed.   Basic Metabolic Panel: Recent Labs  Lab 11/24/19 0436 11/24/19 0817 11/25/19 0308 11/25/19 1029 11/26/19 0145 11/26/19 1447 11/27/19 0425 11/27/19 1955 11/28/19 0426  NA 135   < > 135   < > 135 133* 135 135 138  K 5.0   < > 4.4   < > 3.9 3.9 3.9 4.1 4.0  CL 99   < > 101   < > 102 100 103 101 104  CO2 18*   < > 23   < > 24 23 23 24 25   GLUCOSE 148*   < > 162*   < > 173* 181* 167* 129* 131*  BUN 31*   < > 11   < > 14 17 20  25* 26*  CREATININE 3.58*   < > 1.71*   < > 2.31* 2.61* 2.69* 2.70* 2.63*  CALCIUM 8.0*   < > 8.4*   < > 8.5* 8.4* 8.7* 8.6* 8.7*  MG 2.1  --  2.4  --  2.4  --  2.0  --  1.9  PHOS 3.7   < > 2.1*  --  1.9* 1.5* 2.0* 2.0*  2.3*   < > = values in this interval not displayed.   GFR: Estimated Creatinine Clearance: 23.3 mL/min (A) (by C-G formula based on SCr of 2.63 mg/dL (H)). Liver Function Tests: Recent Labs  Lab  11/23/19 0147 11/23/19 1605 11/26/19 0145 11/26/19 1447 11/27/19 0425 11/27/19 1955 11/28/19 0426  AST 17  --   --   --   --   --   --   ALT 7  --   --   --   --   --   --   ALKPHOS 85  --   --   --   --   --   --   BILITOT 0.8  --   --   --   --   --   --   PROT 6.1*  --   --   --   --   --   --   ALBUMIN 3.2*   < > 2.7* 2.8* 2.6* 2.7* 2.6*   < > = values in this interval not displayed.   No results for input(s): LIPASE, AMYLASE in the last 168 hours. No results for input(s): AMMONIA in the last 168 hours. Coagulation Profile: Recent Labs  Lab 11/23/19 0147  INR 2.2*   Cardiac Enzymes: Recent Labs  Lab 11/23/19 0939  CKTOTAL 850*   BNP (last 3 results) No results for input(s): PROBNP in the last 8760 hours. HbA1C: No results for input(s): HGBA1C in the last 72 hours. CBG: Recent Labs  Lab 11/27/19 1126 11/27/19 1658 11/27/19 2058 11/28/19 0701 11/28/19 1127  GLUCAP 182* 188* 123* 173* 189*   Lipid Profile: No results for input(s): CHOL, HDL, LDLCALC, TRIG, CHOLHDL, LDLDIRECT in the last 72 hours. Thyroid Function Tests: No results for input(s): TSH, T4TOTAL, FREET4, T3FREE, THYROIDAB in the last 72 hours. Anemia Panel: Recent Labs    11/28/19 1147  VITAMINB12 1,872*  FOLATE 5.5*  FERRITIN 93  TIBC 330  IRON 30  RETICCTPCT 1.3   Sepsis Labs: Recent Labs  Lab 11/23/19 1331 11/24/19 0436 11/24/19 1210 11/25/19 0308  LATICACIDVEN 10.5* 6.6* 3.9* 1.8    Recent Results (from the past 240 hour(s))  Urine culture     Status: None   Collection Time: 11/23/19  2:39 AM   Specimen: In/Out Cath Urine  Result Value Ref Range Status   Specimen Description IN/OUT CATH URINE  Final   Special Requests NONE  Final   Culture   Final    NO GROWTH Performed at  [facility], [place] [address], [place], [place] [address]    Report Status 11/24/2019 FINAL  Final  Blood Culture (routine x 2)     Status: None   Collection Time: 11/23/19  3:07 AM   Specimen: BLOOD  Result Value Ref Range Status   Specimen Description BLOOD RIGHT ANTECUBITAL  Final   Special Requests   Final    BOTTLES DRAWN AEROBIC AND ANAEROBIC Blood Culture adequate volume   Culture   Final    NO GROWTH 5 DAYS Performed at [facility], [place] [address], [place], [place] [address]    Report Status 11/28/2019 FINAL  Final  SARS Coronavirus 2 by RT PCR (hospital order, performed in [facility] hospital lab) Nasopharyngeal Nasopharyngeal Swab     Status: None   Collection Time: 11/23/19  4:02 AM   Specimen: Nasopharyngeal Swab  Result Value Ref Range Status   SARS Coronavirus 2 NEGATIVE NEGATIVE Final    Comment: (NOTE) SARS-CoV-2 target nucleic acids are NOT DETECTED.  The SARS-CoV-2 RNA is generally detectable in upper and lower respiratory specimens during the acute phase of infection. The lowest concentration of SARS-CoV-2 viral copies this assay can detect is 250  copies / mL. A negative result does not preclude SARS-CoV-2 infection and should not be used as the sole basis for treatment or other patient management decisions.  A negative result may occur with improper specimen collection / handling, submission of specimen other than nasopharyngeal swab, presence of viral mutation(s) within the areas targeted by this assay, and inadequate number of viral copies (<250 copies / mL). A negative result must be combined with clinical observations, patient history, and epidemiological information.  Fact Sheet for Patients:   StrictlyIdeas.no  Fact Sheet for Healthcare Providers: BankingDealers.co.za  This test is not yet approved or  cleared by the [place] FDA and has been authorized for detection and/or diagnosis of  SARS-CoV-2 by FDA under an Emergency Use Authorization (EUA).  This EUA will remain in effect (meaning this test can be used) for the duration of the COVID-19 declaration under Section 564(b)(1) of the Act, 21 U.S.C. section 360bbb-3(b)(1), unless the authorization is terminated or revoked sooner.  Performed at [facility], [place] [address] [place], [place] [address]   Culture, blood (Routine X 2) w Reflex to ID Panel     Status: None   Collection Time: 11/23/19  4:03 AM   Specimen: BLOOD RIGHT FOREARM  Result Value Ref Range Status   Specimen Description BLOOD RIGHT FOREARM  Final   Special Requests   Final    BOTTLES DRAWN AEROBIC AND ANAEROBIC Blood Culture adequate volume   Culture   Final    NO GROWTH 5 DAYS Performed at [facility], [place] [address], [place], [place] [address]    Report Status 11/28/2019 FINAL  Final  MRSA PCR Screening     Status: None   Collection Time: 11/23/19 10:26 AM   Specimen: Nasopharyngeal  Result Value Ref Range Status   MRSA by PCR NEGATIVE NEGATIVE Final    Comment:        The GeneXpert MRSA Assay (FDA approved for NASAL specimens only), is one component of a comprehensive MRSA colonization surveillance program. It is not intended to diagnose MRSA infection nor to guide or monitor treatment for MRSA infections. Performed at [facility], [place] [address], [place], [place] [address]          Radiology Studies: No results found.      Scheduled Meds:  B-complex with vitamin C  1 tablet Oral Daily   bisoprolol  5 mg Oral Daily   Chlorhexidine Gluconate Cloth  6 each Topical Daily   feeding supplement (ENSURE ENLIVE)  237 mL Oral BID BM   guaiFENesin  600 mg Oral BID   insulin aspart  0-9 Units Subcutaneous TID WC   insulin detemir  6 Units Subcutaneous BID   sodium chloride flush  10-40 mL Intracatheter Q12H   Continuous Infusions:  sodium chloride Stopped (11/24/19 0605)   cefTRIAXone (ROCEPHIN)   IV 1 g (11/27/19 1305)   heparin 1,400 Units/hr (11/28/19 0815)     LOS: 5 days    Time spent: 35 minutes    Aalaiyah Yassin A Dvora Buitron, MD [facility]   If 7PM-7AM, please contact night-coverage [email or web address]  11/28/2019, 1:32 PM

## 2019-11-28 NOTE — Progress Notes (Signed)
Temporary HD catheter removed per order and line is intact. Vaseline gauze and gauze dressing applied and pressure held. Site clean, dry, and intact. Patient and RN aware that patient is on bedrest for 30 minutes. Patient aware to leave dressing on and dry for 24 hours.

## 2019-11-28 NOTE — Progress Notes (Signed)
ANTICOAGULATION CONSULT NOTE - Follow Up Consult  Pharmacy Consult for heparin while Eliquis on hold Indication: atrial fibrillation  Allergies  Allergen Reactions  . Nsaids Swelling    Hands and legs swell  . Penicillins Hives    Has patient had a PCN reaction causing immediate rash, facial/tongue/throat swelling, SOB or lightheadedness with hypotension: Yes Has patient had a PCN reaction causing severe rash involving mucus membranes or skin necrosis: No Has patient had a PCN reaction that required hospitalization: No Has patient had a PCN reaction occurring within the last 10 years: No If all of the above answers are "NO", then may proceed with Cephalosporin use.     Patient Measurements: Height: 5\' 5"  (165.1 cm) Weight: (!) 97.6 kg (215 lb 2.7 oz) IBW/kg (Calculated) : 57 Heparin Dosing Weight: 97.6  Vital Signs: Temp: 97.9 F (36.6 C) (08/01 0412) Temp Source: Oral (08/01 0412) BP: 127/69 (08/01 0412) Pulse Rate: 110 (08/01 0412)  Labs: Recent Labs    11/26/19 0145 11/26/19 1447 11/27/19 0425 11/27/19 1107 11/27/19 1955 11/28/19 0426  HGB  --   --   --  8.6*  --  8.5*  HCT  --   --   --  26.8*  --  26.6*  PLT  --   --   --  110*  --  116*  APTT 40*  --  35  --   --  45*  HEPARINUNFRC 0.32  --  0.21*  --  0.15* 0.19*  CREATININE 2.31*   < > 2.69*  --  2.70* 2.63*   < > = values in this interval not displayed.    Estimated Creatinine Clearance: 23.3 mL/min (A) (by C-G formula based on SCr of 2.63 mg/dL (H)).   Medications:  Scheduled:  . B-complex with vitamin C  1 tablet Oral Daily  . bisoprolol  5 mg Oral Daily  . Chlorhexidine Gluconate Cloth  6 each Topical Daily  . feeding supplement (ENSURE ENLIVE)  237 mL Oral BID BM  . guaiFENesin  600 mg Oral BID  . insulin aspart  0-9 Units Subcutaneous TID WC  . insulin detemir  6 Units Subcutaneous BID  . sodium chloride flush  10-40 mL Intracatheter Q12H    Assessment: 69 YO female with history of atrial  fibrillation to start heparin while Eliquis on hold pending possible procedures.  Last dose of Eliquis was 7/26 ~8pm.  She presented with DKA and AKI w/ metformin induced lactic acidosis.  CTA showed chronic PAD, however vascular surgery said patient was not a candidate for revascularization and to manage with wound care.    Per Dr. Tyrell Antonio, continuing to hold Eliquis while waiting for recommendations regarding permanent hemodialysis catheter.  Heparin level trending down despite multiple rate increases on 7/31.   Goal of Therapy:  Heparin level 0.3-0.7 units/ml Monitor platelets by anticoagulation protocol: Yes   Plan:  Increase heparin to 1400 units/h Monitor 8 hour HL  Monitor for signs/symptoms of bleeding.  Dimple Nanas, PharmD PGY-1 Acute Care Pharmacy Resident 11/28/2019 7:35 AM

## 2019-11-29 LAB — RENAL FUNCTION PANEL
Albumin: 2.8 g/dL — ABNORMAL LOW (ref 3.5–5.0)
Anion gap: 12 (ref 5–15)
BUN: 28 mg/dL — ABNORMAL HIGH (ref 8–23)
CO2: 23 mmol/L (ref 22–32)
Calcium: 8.8 mg/dL — ABNORMAL LOW (ref 8.9–10.3)
Chloride: 101 mmol/L (ref 98–111)
Creatinine, Ser: 2.6 mg/dL — ABNORMAL HIGH (ref 0.44–1.00)
GFR calc Af Amer: 21 mL/min — ABNORMAL LOW (ref >60)
GFR calc non Af Amer: 18 mL/min — ABNORMAL LOW (ref >60)
Glucose, Bld: 135 mg/dL — ABNORMAL HIGH (ref 70–99)
Phosphorus: 2.9 mg/dL (ref 2.5–4.6)
Potassium: 4.2 mmol/L (ref 3.5–5.1)
Sodium: 136 mmol/L (ref 135–145)

## 2019-11-29 LAB — CBC
HCT: 27.2 % — ABNORMAL LOW (ref 36.0–46.0)
Hemoglobin: 8.8 g/dL — ABNORMAL LOW (ref 12.0–15.0)
MCH: 32.4 pg (ref 26.0–34.0)
MCHC: 32.4 g/dL (ref 30.0–36.0)
MCV: 100 fL (ref 80.0–100.0)
Platelets: 138 10*3/uL — ABNORMAL LOW (ref 150–400)
RBC: 2.72 MIL/uL — ABNORMAL LOW (ref 3.87–5.11)
RDW: 14.9 % (ref 11.5–15.5)
WBC: 9.9 10*3/uL (ref 4.0–10.5)
nRBC: 0.5 % — ABNORMAL HIGH (ref 0.0–0.2)

## 2019-11-29 LAB — MAGNESIUM: Magnesium: 1.8 mg/dL (ref 1.7–2.4)

## 2019-11-29 LAB — GLUCOSE, CAPILLARY
Glucose-Capillary: 133 mg/dL — ABNORMAL HIGH (ref 70–99)
Glucose-Capillary: 188 mg/dL — ABNORMAL HIGH (ref 70–99)

## 2019-11-29 MED ORDER — CLOTRIMAZOLE 2 % VA CREA: 1.0000 | TOPICAL_CREAM | Freq: Every day | VAGINAL | 0 refills | Status: DC

## 2019-11-29 MED ORDER — FERROUS SULFATE 325 (65 FE) MG PO TABS: 325.0000 mg | ORAL_TABLET | Freq: Every day | ORAL | 3 refills | Status: DC

## 2019-11-29 MED ORDER — GUAIFENESIN ER 600 MG PO TB12: 600.0000 mg | ORAL_TABLET | Freq: Two times a day (BID) | ORAL | 0 refills | Status: DC

## 2019-11-29 MED ORDER — CLOTRIMAZOLE 2 % VA CREA
1.0000 | TOPICAL_CREAM | Freq: Every day | VAGINAL | Status: DC
  Administered 2019-11-29: 1 via VAGINAL
  Filled 2019-11-29: qty 21

## 2019-11-29 MED ORDER — TORSEMIDE 20 MG PO TABS: 20.0000 mg | ORAL_TABLET | Freq: Every day | ORAL | 1 refills | Status: DC

## 2019-11-29 MED ORDER — FOLIC ACID 1 MG PO TABS: 1.0000 mg | ORAL_TABLET | Freq: Every day | ORAL | 0 refills | Status: DC

## 2019-11-29 MED ORDER — INSULIN NPH ISOPHANE & REGULAR (70-30) 100 UNIT/ML ~~LOC~~ SUSP: 6.0000 [IU] | Freq: Two times a day (BID) | SUBCUTANEOUS | 11 refills | Status: DC

## 2019-11-29 NOTE — Progress Notes (Signed)
SATURATION QUALIFICATIONS: (This note is used to comply with regulatory documentation for home oxygen)  Patient Saturations on Room Air at Rest = 96%  Patient Saturations on Room Air while Ambulating = 96% to 100%%

## 2019-11-29 NOTE — Discharge Instructions (Signed)
bilateral LE wounds consisting of Xeroform gauze and foam dressings.

## 2019-11-29 NOTE — Progress Notes (Signed)
Physical Therapy Treatment Patient Details Name: Paula Wheeler MRN: [id number] DOB: 05-18-1950 Today's Date: 11/29/2019    History of Present Illness 69 yo admitted 7/27 with sepsis of unknown origin with multiple metabolic derangements, bil LE wounds. PMHx: DM, AFib, CHF, PAD, HLD, HTN, anemia, depression    PT Comments    Pt received in bed, eager and ready to participate in therapy. She required min guard assist bed mobility, min guard assist transfers, and min guard assist ambulation 200' with RW. SpO2 98% on RA. Pt with significant improvement in mobility. D/C recs updated to HHPT. Pt plans to discharge to her brother's house, one level with ramp to enter.    Follow Up Recommendations  Home health PT;Supervision/Assistance - 24 hour     Equipment Recommendations  Rolling walker with 5" wheels;3in1 (PT)    Recommendations for Other Services       Precautions / Restrictions Precautions Precautions: Fall    Mobility  Bed Mobility Overal bed mobility: Needs Assistance Bed Mobility: Supine to Sit     Supine to sit: Min guard;HOB elevated     General bed mobility comments: +rail, increased time  Transfers Overall transfer level: Needs assistance Equipment used: Rolling walker (2 wheeled) Transfers: Sit to/from [facility] Sit to Stand: Min guard Stand pivot transfers: Min guard       General transfer comment: cues for hand placement, increased time to stabilize initial standing balance  Ambulation/Gait Ambulation/Gait assistance: Min guard Gait Distance (Feet): 200 Feet Assistive device: Rolling walker (2 wheeled) Gait Pattern/deviations: Step-through pattern;Decreased stride length Gait velocity: decreased Gait velocity interpretation: 1.31 - 2.62 ft/sec, indicative of limited community ambulator General Gait Details: steady gait with RW. HR 85-93. SpO2 98% on RA.   Stairs             Wheelchair Mobility    Modified Rankin  (Stroke Patients Only)       Balance Overall balance assessment: Needs assistance Sitting-balance support: Feet supported;No upper extremity supported Sitting balance-Leahy Scale: Good     Standing balance support: Bilateral upper extremity supported;During functional activity Standing balance-Leahy Scale: Poor Standing balance comment: RW for support                            Cognition Arousal/Alertness: Awake/alert Behavior During Therapy: WFL for tasks assessed/performed Overall Cognitive Status: Within Functional Limits for tasks assessed                                        Exercises      General Comments General comments (skin integrity, edema, etc.): edema noted BLE (L > R)      Pertinent Vitals/Pain Pain Assessment: No/denies pain    Home Living                      Prior Function            PT Goals (current goals can now be found in the care plan section) Acute Rehab PT Goals Patient Stated Goal: return to work and home Progress towards PT goals: Progressing toward goals    Frequency    Min 3X/week      PT Plan Discharge plan needs to be updated;Equipment recommendations need to be updated    Co-evaluation  AM-PAC PT "6 Clicks" Mobility   Outcome Measure  Help needed turning from your back to your side while in a flat bed without using bedrails?: None Help needed moving from lying on your back to sitting on the side of a flat bed without using bedrails?: A Little Help needed moving to and from a bed to a chair (including a wheelchair)?: A Little Help needed standing up from a chair using your arms (e.g., wheelchair or bedside chair)?: A Little Help needed to walk in hospital room?: A Little Help needed climbing 3-5 steps with a railing? : A Lot 6 Click Score: 18    End of Session Equipment Utilized During Treatment: Gait belt Activity Tolerance: Patient tolerated treatment  well Patient left: in chair;with call bell/phone within reach Nurse Communication: Mobility status PT Visit Diagnosis: Other abnormalities of gait and mobility (R26.89);Muscle weakness (generalized) (M62.81);Difficulty in walking, not elsewhere classified (R26.2)     Time: 7096-4383 PT Time Calculation (min) (ACUTE ONLY): 24 min  Charges:  $Gait Training: 23-37 mins                     Lorrin Goodell, [place]  Office # [phone] Pager [phone]    [name] 11/29/2019, 10:01 AM

## 2019-11-29 NOTE — Progress Notes (Signed)
Occupational Therapy Treatment Patient Details Name: Paula Wheeler MRN: [id number] DOB: 07-20-1950 Today's Date: 11/29/2019    History of present illness 69 yo admitted 7/27 with sepsis of unknown origin with multiple metabolic derangements, bil LE wounds. PMHx: DM, AFib, CHF, PAD, HLD, HTN, anemia, depression   OT comments  Pt progressing with OT goals and demonstrating ability to complete toileting tasks and mobility in room using RW without assistance. Pt continues to require Min A for LB dressing due to difficulty reaching feet. Provided education to pt on energy conservation strategies, AE/DME use to maximize ADL/IADL independence with pt receptive of all education. Recommend BSC for use in shower and over toilet at home as needed. DC recommendations updated to [facility] based on performance today.    Follow Up Recommendations  Home health OT    Equipment Recommendations  3 in 1 bedside commode;Other (comment) (RW)    Recommendations for Other Services      Precautions / Restrictions Precautions Precautions: Fall Restrictions Weight Bearing Restrictions: No       Mobility Bed Mobility Overal bed mobility: Needs Assistance Bed Mobility: Supine to Sit     Supine to sit: Min guard;HOB elevated     General bed mobility comments: up in recliner on entry  Transfers Overall transfer level: Needs assistance Equipment used: Rolling walker (2 wheeled) Transfers: Sit to/from [facility] Sit to Stand: Modified independent (Device/Increase time) Stand pivot transfers: Supervision       General transfer comment: improving consistency of hand placement    Balance Overall balance assessment: Needs assistance Sitting-balance support: Feet supported;No upper extremity supported Sitting balance-Leahy Scale: Good     Standing balance support: Bilateral upper extremity supported;During functional activity Standing balance-Leahy Scale: Fair Standing balance  comment: static standing good, use of RW for steadiness during dynamic tasks                           ADL either performed or assessed with clinical judgement   ADL Overall ADL's : Needs assistance/impaired     Grooming: Modified independent;Standing;Wash/dry hands Grooming Details (indicate cue type and reason): Modified Independent washing hands at sink with RW             Lower Body Dressing: Minimal assistance;Sit to/from stand Lower Body Dressing Details (indicate cue type and reason): Min A to don socks, difficulty with R sock. Pt reports propping up on couch at home to don. Educated on use of AE for task as needed Toilet Transfer: Supervision/safety;Ambulation;Regular Toilet;RW Toilet Transfer Details (indicate cue type and reason): Supervision for mobility to/from bathroom Toileting- Clothing Manipulation and Hygiene: Modified independent;Sit to/from stand Toileting - Clothing Manipulation Details (indicate cue type and reason): Modified Independent, no safety concerns     Functional mobility during ADLs: Supervision/safety;Rolling walker General ADL Comments: Pt with improvements in ADL status. Pt with improved steadiness in standing and receptive to energy conservation education to maximize ADL/IADL performance     Vision       Perception     Praxis      Cognition Arousal/Alertness: Awake/alert Behavior During Therapy: [facility] for tasks assessed/performed Overall Cognitive Status: Within Functional Limits for tasks assessed                                          Exercises     Shoulder Instructions  General Comments Educated on energy conservation strategies to implement at home, handout provided    Pertinent Vitals/ Pain       Pain Assessment: No/denies pain Pain Intervention(s): Monitored during session  Home Living                                          Prior Functioning/Environment               Frequency  Min 2X/week        Progress Toward Goals  OT Goals(current goals can now be found in the care plan section)  Progress towards OT goals: Progressing toward goals  Acute Rehab OT Goals Patient Stated Goal: return to work and home OT Goal Formulation: With patient Time For Goal Achievement: 12/10/19 Potential to Achieve Goals: Good ADL Goals Pt Will Perform Grooming: with set-up;standing Pt Will Perform Lower Body Dressing: with supervision;sit to/from stand Additional ADL Goal #1: pt will increase to supervisionA for OOB ADL in order to increase endurance and increase ability to go home. Additional ADL Goal #2: Pt will recall/utilize (3) energy conservation techniques for OOB ADL and mobility in order to increase safety and endurance.  Plan Discharge plan needs to be updated    Co-evaluation                 AM-PAC OT "6 Clicks" Daily Activity     Outcome Measure   Help from another person eating meals?: None Help from another person taking care of personal grooming?: None Help from another person toileting, which includes using toliet, bedpan, or urinal?: A Little Help from another person bathing (including washing, rinsing, drying)?: A Little Help from another person to put on and taking off regular upper body clothing?: A Little Help from another person to put on and taking off regular lower body clothing?: A Little 6 Click Score: 20    End of Session Equipment Utilized During Treatment: Rolling walker  OT Visit Diagnosis: Unsteadiness on feet (R26.81);Muscle weakness (generalized) (M62.81)   Activity Tolerance Patient tolerated treatment well   Patient Left in chair;with call bell/phone within reach   Nurse Communication          Time: 9622-2979 OT Time Calculation (min): 27 min  Charges: OT General Charges $OT Visit: 1 Visit OT Treatments $Self Care/Home Management : 8-22 mins $Therapeutic Activity: 8-22 mins  Layla Maw,  OTR/L   Layla Maw 11/29/2019, 11:54 AM

## 2019-11-29 NOTE — Discharge Summary (Addendum)
Physician Discharge Summary  Paula Wheeler VPX:[id number] DOB: 01-Apr-1951 DOA: 11/23/2019  PCP: Hulan Fess, MD  Admit date: 11/23/2019 Discharge date: 11/29/2019  Admitted From: Home  Disposition: home   Recommendations for Outpatient Follow-up:  1. Follow up with PCP in 1-2 weeks 2. Please obtain BMP/CBC in one week 3. Follow with nephrology for further care of renal failure.  4. Follow up resolution LE wound.  5. Repeat hb and folic acid level.  6. Needs to follow up with Vascular for PVD  Home Health: Yes.   Discharge Condition: Stable.  CODE STATUS: Full code Diet recommendation: Heart Healthy  Brief/Interim Summary: 69 year old with past medical history significant for CKD stage IV, diabetes,  hypertension who presents with generalized abdominal pain, lower extremity pain, nausea and vomiting.  Patient has not been able to tolerate any oral intake.  She has a history of chronic abdominal pain but worse recently mainly over the left lower side.  Lower extremity pain also chronic due to claudication but worse over the past 2 days.  In the ED patient was found to be hypotensive, multiple metabolic derangement including AKI, profound lactic acidosis.  Concern for DKA renal failure.  CTA abdomen and pelvis and CTA aorto bifemoral showed chronic PAD but no evidence of acute ischemia.  Presentation consistent with DKA, AKI and Metformin induced lactic acidosis.  Nose with acute hypoxic respiratory failure secondary to lower lobe pneumonia.  She require CRRT for AKI.  She was also treated with insulin drip for DKA.   1-Acute hypoxic respiratory failure, secondary to lower lobe PNA;  -Patient was transiently on IV pressors. -She received 5 days of cefepime and vancomycin.  -Completed 2 days of ceftriaxone. - incentive spirometry -evaluate for home oxygen needs. she doesn't need Home oxygen.   2-DKA; Diabetes type 2;  She was on insulin drip.  Received fluids.  Transition  to Levemir 6 units twice daily.  A sliding scale insulin resume 70/30 lower home dose 6 units BID.   3-AKI on CKD stage:  Patient presented with a creatinine of 10, BUN 75, CO2 less than 7, hyperkalemia potassium 5.7. Treated with CRRT.  Nephrology following in regards needs for HD>  HD catheter to be removed today.  Cr down to 2.6. 1.3 L urine out put.  Discussed with Dr Moshe Cipro, plan to resume low dose torsemide.   4-A fib, HTN, HLD;  She was on heparin gtt. Now back on eliquis.   5-Lactic acidosis: Secondary to Metformin, DKA, infectious process. Resolved.  6-Circulatory shock; versus septic shock: Patient presented with metabolic acidosis, pneumonia, leukocytosis white blood cell 23. Sepsis ruled in She required IV pressors for 24 hours. Off of pressor.  Treated with IV antibiotics.   7-Abdominal pain, lower extremity pain: No evidence of ischemic bowel or or ischemic limb although has severe PAD. Wound care per vascular. Further recommendation per vascular team.  Probably COPD Nebulizer treatment  Anemia; low folic acid. Start supplement.  Start iron table.  Hb stable.   Stage 2, pressure injury, perineum right and left;  Local care   Discharge Diagnoses:  Active Problems:   Acute renal failure (ARF) (HCC)   Pressure injury of skin    Discharge Instructions  Discharge Instructions    Diet - low sodium heart healthy   Complete by: As directed    Discharge wound care:   Complete by: As directed    See above   Increase activity slowly   Complete by: As directed  Allergies as of 11/29/2019      Reactions   Nsaids Swelling   Hands and legs swell   Penicillins Hives   Has patient had a PCN reaction causing immediate rash, facial/tongue/throat swelling, SOB or lightheadedness with hypotension: Yes Has patient had a PCN reaction causing severe rash involving mucus membranes or skin necrosis: No Has patient had a PCN reaction that required  hospitalization: No Has patient had a PCN reaction occurring within the last 10 years: No If all of the above answers are "NO", then may proceed with Cephalosporin use.      Medication List    STOP taking these medications   amLODipine 10 MG tablet Commonly known as: NORVASC   metFORMIN 500 MG tablet Commonly known as: GLUCOPHAGE   pantoprazole 40 MG tablet Commonly known as: Protonix   triamcinolone cream 0.1 % Commonly known as: KENALOG   triamterene-hydrochlorothiazide 75-50 MG tablet Commonly known as: MAXZIDE     TAKE these medications   albuterol 108 (90 Base) MCG/ACT inhaler Commonly known as: VENTOLIN HFA Inhale 2 puffs into the lungs every 4 (four) hours as needed for wheezing or shortness of breath.   allopurinol 100 MG tablet Commonly known as: ZYLOPRIM Take 100 mg by mouth daily.   apixaban 5 MG Tabs tablet Commonly known as: Eliquis Take 1 tablet (5 mg total) by mouth 2 (two) times daily.   atorvastatin 40 MG tablet Commonly known as: LIPITOR Take 1 tablet (40 mg total) by mouth at bedtime.   B-D INS SYR ULTRAFINE 1CC/31G 31G X 5/16" 1 ML Misc Generic drug: Insulin Syringe-Needle U-100 by Does not apply route.   bisoprolol 5 MG tablet Commonly known as: ZEBETA Take 1 tablet (5 mg total) by mouth daily.   clotrimazole 2 % vaginal cream Commonly known as: GYNE-LOTRIMIN 3 Place 1 Applicatorful vaginally at bedtime.   escitalopram 10 MG tablet Commonly known as: LEXAPRO Take 10 mg by mouth every evening.   ferrous sulfate 325 (65 FE) MG tablet Take 1 tablet (325 mg total) by mouth daily with breakfast. Start taking on: November 29, 2200   folic acid 1 MG tablet Commonly known as: FOLVITE Take 1 tablet (1 mg total) by mouth daily. Start taking on: November 30, 2019   guaiFENesin 600 MG 12 hr tablet Commonly known as: MUCINEX Take 1 tablet (600 mg total) by mouth 2 (two) times daily.   HYDROcodone-acetaminophen 5-325 MG tablet Commonly known as:  NORCO/VICODIN Take 1 tablet by mouth every 6 (six) hours as needed for moderate pain.   insulin NPH-regular Human (70-30) 100 UNIT/ML injection Inject 6 Units into the skin 2 (two) times daily with a meal. Inject 30 units into the skin in the morning and 70 units into the skin in the evening What changed:   how much to take  when to take this   loratadine 10 MG tablet Commonly known as: CLARITIN Take 10 mg by mouth daily.   Spiriva HandiHaler 18 MCG inhalation capsule Generic drug: tiotropium Place 18 mcg into inhaler and inhale daily.   torsemide 20 MG tablet Commonly known as: DEMADEX Take 1 tablet (20 mg total) by mouth daily. What changed: See the new instructions.   VITAMIN B-12 IJ Inject 1 mL as directed every 30 (thirty) days.            Durable Medical Equipment  (From admission, onward)         Start     Ordered   11/29/19 0725  For home use only DME oxygen  Once       Question Answer Comment  Length of Need 6 Months   Mode or (Route) Nasal cannula   Liters per Minute 3   Frequency Continuous (stationary and portable oxygen unit needed)   Oxygen delivery system Gas      11/29/19 0725           Discharge Care Instructions  (From admission, onward)         Start     Ordered   11/29/19 0000  Discharge wound care:       Comments: See above   11/29/19 0945          Follow-up Information    Hulan Fess, MD. Go on 12/03/2019.   Specialty: Family Medicine Why: APPT. AT 11:15 AM. PLEASE ARRIVE 15 MINS EARLY! You need lab work to check kidney function.  Contact information: [place] [address] [phone]        Nahser, Wonda Cheng, MD .   Specialty: Cardiology Contact information: [address] [address] [phone]        Rexene Agent, MD In 1 week.   Specialty: Nephrology Why: call and schedule appointment with kidney Doctor Contact information: [place]  [phone]              Allergies  Allergen Reactions   Nsaids Swelling    Hands and legs swell   Penicillins Hives    Has patient had a PCN reaction causing immediate rash, facial/tongue/throat swelling, SOB or lightheadedness with hypotension: Yes Has patient had a PCN reaction causing severe rash involving mucus membranes or skin necrosis: No Has patient had a PCN reaction that required hospitalization: No Has patient had a PCN reaction occurring within the last 10 years: No If all of the above answers are "NO", then may proceed with Cephalosporin use.     Consultations:  Nephrology  Vascular   Procedures/Studies: CT Angio Aortobifemoral W and/or Wo Contrast  Result Date: 11/23/2019 CLINICAL DATA:  Left leg pain EXAM: CT ANGIOGRAPHY AOBIFEM WITHOUT AND WITH CONTRAST TECHNIQUE: Arterially timed CTA of the legs was performed after bolus administration of iodinated contrast. CONTRAST:  168mL OMNIPAQUE IOHEXOL 350 MG/ML SOLN COMPARISON:  None. FINDINGS: Left lower extremity: Heavily calcified iliac arteries by irregular plaque. No acute dissection or aneurysm. The iliacs are so heavily diseased that there is no normal segment for baseline diameter measurement. The common femoral is likewise heavily diseased with severe mixed density plaque throughout the SFA causing high-grade stenoses and levels of nonvisualized flow. The profundus femoris is more robustly enhancing, with good opacification of multiple muscular branches/collateral. Diffusely attenuated popliteal artery with generalized atheromatous wall thickening. No popliteal aneurysm or dissection is seen. In the upper calf there is 3 vessel enhancement with flow preferentially in the anterior and posterior tibial arteries. By the ankle, the scan has out paced the bolus. Right lower extremity: Heavily diseased iliac arteries with at least 80% narrowing at the common iliac origin and additional notable flow reducing  stenosis of the external iliac just above the inguinal ligament. The SFA is primarily occluded in the thigh, reconstituting near the hiatus with more robust flow in the popliteal artery due to genicular branches. Popliteal artery and upper calf vessels are patent, but the bolus has been out paced by the level of the ankle. No acute osseous finding. There is generalized osteopenia. Generalized subcutaneous reticulation  at the level of the calf and ankles. Review of the MIP images confirms the above findings. IMPRESSION: 1. No acute finding. 2. Severe peripheral vascular disease with flow reducing stenoses/chronic occlusions on the right more than left, as described above. 3. Negative for aneurysm. Electronically Signed   By: Monte Fantasia M.D.   On: 11/23/2019 04:27   DG Chest Port 1 View  Result Date: 11/24/2019 CLINICAL DATA:  Respiratory failure, history COPD, asthma, renal failure EXAM: PORTABLE CHEST 1 VIEW COMPARISON:  Portable exam 0535 hours compared to 11/23/2019 FINDINGS: RIGHT jugular line tip projecting over RIGHT atrium. Enlargement of cardiac silhouette. Mediastinal contours and pulmonary vascularity normal. Infiltrate identified at the lower LEFT lung favor pneumonia. Subsegmental atelectasis RIGHT base. Remaining lungs clear. No pleural effusion or pneumothorax. Bones demineralized. IMPRESSION: Enlargement of cardiac silhouette with RIGHT basilar atelectasis. LEFT lower lobe infiltrate favoring pneumonia. Electronically Signed   By: Lavonia Dana M.D.   On: 11/24/2019 08:15   DG Chest Portable 1 View  Result Date: 11/23/2019 CLINICAL DATA:  Central catheter placement EXAM: PORTABLE CHEST 1 VIEW COMPARISON:  November 23, 2019 study obtained earlier in the day FINDINGS: Central catheter tip is at the cavoatrial junction. No pneumothorax. There is atelectatic change in the left lower lobe. Lungs elsewhere clear. Heart is enlarged, stable, with pulmonary vascularity normal. No adenopathy. No bone  lesions. IMPRESSION: Central catheter tip at cavoatrial junction. No pneumothorax. New atelectatic change left lower lobe region. Lungs elsewhere clear. Stable cardiac prominence. Electronically Signed   By: Lowella Grip III M.D.   On: 11/23/2019 04:57   DG Chest Port 1 View  Result Date: 11/23/2019 CLINICAL DATA:  Shortness of breath for several days EXAM: PORTABLE CHEST 1 VIEW COMPARISON:  01/29/2018 FINDINGS: Cardiac shadow is mildly enlarged. Lungs are well aerated bilaterally. Minimal platelike atelectasis in the right base is noted. No other focal abnormality is seen. IMPRESSION: Minimal right basilar atelectasis. Electronically Signed   By: Inez Catalina M.D.   On: 11/23/2019 02:20   ECHOCARDIOGRAM COMPLETE  Result Date: 11/23/2019    ECHOCARDIOGRAM REPORT   Patient Name:   [name] Date of Exam: 11/23/2019 Medical Rec #:  [id number]         Height:       65.0 in Accession #:    [id number]        Weight:       230.0 lb Date of Birth:  1950/09/20         BSA:          2.099 m Patient Age:    69 years          BP:           118/59 mmHg Patient Gender: F                 HR:           117 bpm. Exam Location:  Inpatient Procedure: 2D Echo, Color Doppler and Cardiac Doppler Indications:    I51.7 Cardiomegaly  History:        Patient has prior history of Echocardiogram examinations, most                 recent 09/24/2017. CHF, COPD, Arrythmias:Atrial Fibrillation;                 Risk Factors:Hypertension, Diabetes and Dyslipidemia.  Sonographer:    Raquel Sarna Senior RDCS Referring Phys: [place]  1. Left ventricular ejection fraction,  by estimation, is 60 to 65%. The left ventricle has normal function. The left ventricle demonstrates regional wall motion abnormalities (see scoring diagram/findings for description). Left ventricular diastolic parameters are indeterminate.  2. Right ventricular systolic function is normal. The right ventricular size is normal.  3. The mitral valve  is normal in structure. No evidence of mitral valve regurgitation. No evidence of mitral stenosis.  4. The aortic valve is normal in structure. Aortic valve regurgitation is not visualized. No aortic stenosis is present.  5. The inferior vena cava is dilated in size with <50% respiratory variability, suggesting right atrial pressure of 15 mmHg. FINDINGS  Left Ventricle: Left ventricular ejection fraction, by estimation, is 60 to 65%. The left ventricle has normal function. The left ventricle demonstrates regional wall motion abnormalities. The left ventricular internal cavity size was normal in size. There is no left ventricular hypertrophy. Left ventricular diastolic parameters are indeterminate.  LV Wall Scoring: The basal inferolateral segment is akinetic. The mid inferolateral segment is normal. Right Ventricle: The right ventricular size is normal. No increase in right ventricular wall thickness. Right ventricular systolic function is normal. Left Atrium: Left atrial size was normal in size. Right Atrium: Right atrial size was normal in size. Pericardium: There is no evidence of pericardial effusion. Mitral Valve: The mitral valve is normal in structure. Normal mobility of the mitral valve leaflets. No evidence of mitral valve regurgitation. No evidence of mitral valve stenosis. Tricuspid Valve: The tricuspid valve is normal in structure. Tricuspid valve regurgitation is not demonstrated. No evidence of tricuspid stenosis. Aortic Valve: The aortic valve is normal in structure. Aortic valve regurgitation is not visualized. No aortic stenosis is present. Pulmonic Valve: The pulmonic valve was normal in structure. Pulmonic valve regurgitation is not visualized. No evidence of pulmonic stenosis. Aorta: The aortic root is normal in size and structure. Venous: The inferior vena cava is dilated in size with less than 50% respiratory variability, suggesting right atrial pressure of 15 mmHg. IAS/Shunts: No atrial level  shunt detected by color flow Doppler.  LEFT VENTRICLE PLAX 2D LVIDd:         5.00 cm LVIDs:         4.50 cm LV PW:         1.10 cm LV IVS:        1.30 cm LVOT diam:     2.10 cm LV SV:         57 LV SV Index:   27 LVOT Area:     3.46 cm  RIGHT VENTRICLE RV S prime:     7.40 cm/s TAPSE (M-mode): 1.4 cm LEFT ATRIUM             Index       RIGHT ATRIUM           Index LA diam:        3.90 cm 1.86 cm/m  RA Area:     19.30 cm LA Vol (A2C):   67.5 ml 32.16 ml/m RA Volume:   50.00 ml  23.82 ml/m LA Vol (A4C):   65.5 ml 31.20 ml/m LA Biplane Vol: 69.1 ml 32.92 ml/m  AORTIC VALVE LVOT Vmax:   105.30 cm/s LVOT Vmean:  66.667 cm/s LVOT VTI:    0.165 m  AORTA Ao Root diam: 3.70 cm Ao Asc diam:  3.40 cm  SHUNTS Systemic VTI:  0.16 m Systemic Diam: 2.10 cm Candee Furbish MD Electronically signed by Candee Furbish MD Signature Date/Time: 11/23/2019/12:36:58 PM  Final    CT Angio Chest/Abd/Pel for Dissection W and/or Wo Contrast  Result Date: 11/23/2019 CLINICAL DATA:  Abdominal pain with aortic dissection suspected. EXAM: CT ANGIOGRAPHY CHEST, ABDOMEN AND PELVIS TECHNIQUE: Non-contrast CT of the chest was initially obtained. Multidetector CT imaging through the chest, abdomen and pelvis was performed using the standard protocol during bolus administration of intravenous contrast. Multiplanar reconstructed images and MIPs were obtained and reviewed to evaluate the vascular anatomy. CONTRAST:  None similar COMPARISON:  None. FINDINGS: CTA CHEST FINDINGS Cardiovascular: No aortic intramural hematoma. Atheromatous calcification of the aorta and coronaries. No aortic dissection. No major pulmonary artery filling defect. No pericardial effusion. Normal heart size. Atheromatous narrowing of great vessels not measuring over 50%. Mediastinum/Nodes: Negative for thoracic adenopathy. Thyroid nodules measuring up to 2.5 cm in the left lobe. Lungs/Pleura: Small patchy primarily subpleural opacities affecting all lobes. No edema, effusion,  or pneumothorax Musculoskeletal: Spondylosis that is generalized and advanced. No acute finding. Review of the MIP images confirms the above findings. CTA ABDOMEN AND PELVIS FINDINGS VASCULAR Aorta: Heavily calcified aorta.  No aneurysm or dissection. Celiac: Atheromatous narrowing of the celiac which is high-grade to the degree that the lumen is difficult to accurately measure. The wall is somewhat indistinct proximally but there is no discrete dissection. SMA: Mild atheromatous plaque at the origin. No flow limiting stenosis, dissection, or major branch occlusion Renals: Single bilateral renal arteries with stenosis on the left measuring approximately 70% and on the right measuring approximately 50%. No superimposed dissection or aneurysm. IMA: Patent Inflow: Reported separately Veins: Negative in the arterial phase Review of the MIP images confirms the above findings. NON-VASCULAR Hepatobiliary: No focal liver abnormality.No evidence of biliary obstruction or stone. Pancreas: Unremarkable. Spleen: Unremarkable. Adrenals/Urinary Tract: Negative adrenals. No hydronephrosis or stone. Unremarkable bladder, which is collapsed by a Foley catheter. Stomach/Bowel:  No obstruction. No visible bowel inflammation Lymphatic: No mass or adenopathy. Reproductive:Negative Other: No ascites or pneumoperitoneum. Musculoskeletal: Generalized advanced lumbar spine degenerative disease. No acute or aggressive finding. Review of the MIP images confirms the above findings. IMPRESSION: 1. No evidence of acute aortic syndrome. 2. Patchy subpleural airspace disease most commonly from atypical or inflammatory pneumonia. Please correlate with COVID-19 status. 3. 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 4. 2.5 cm left thyroid nodule. Recommend thyroid US.(ref: J Am Coll Radiol. 2015 Feb;12(2): 143-50). Aortic Atherosclerosis (ICD10-I70.0). Electronically Signed   By: Monte Fantasia M.D.   On:  11/23/2019 04:15   (Echo, Carotid, EGD, Colonoscopy, ERCP)    Subjective:   Discharge Exam: Vitals:   11/29/19 0550 11/29/19 0943  BP: 124/69 126/85  Pulse: 87 88  Resp: 16 18  Temp: 98.1 F (36.7 C) 98.5 F (36.9 C)  SpO2: 100% 99%     General: Pt is alert, awake, not in acute distress Cardiovascular: RRR, S1/S2 +, no rubs, no gallops Respiratory: CTA bilaterally, no wheezing, no rhonchi Abdominal: Soft, NT, ND, bowel sounds + Extremities: no edema, no cyanosis    The results of significant diagnostics from this hospitalization (including imaging, microbiology, ancillary and laboratory) are listed below for reference.     Microbiology: Recent Results (from the past 240 hour(s))  Urine culture     Status: None   Collection Time: 11/23/19  2:39 AM   Specimen: In/Out Cath Urine  Result Value Ref Range Status   Specimen Description IN/OUT CATH URINE  Final   Special Requests NONE  Final   Culture   Final  NO GROWTH Performed at [facility], [place] [address] [place], [place] [address]    Report Status 11/24/2019 FINAL  Final  Blood Culture (routine x 2)     Status: None   Collection Time: 11/23/19  3:07 AM   Specimen: BLOOD  Result Value Ref Range Status   Specimen Description BLOOD RIGHT ANTECUBITAL  Final   Special Requests   Final    BOTTLES DRAWN AEROBIC AND ANAEROBIC Blood Culture adequate volume   Culture   Final    NO GROWTH 5 DAYS Performed at [facility], [place] [address], [place], [place] [address]    Report Status 11/28/2019 FINAL  Final  SARS Coronavirus 2 by RT PCR (hospital order, performed in [facility] hospital lab) Nasopharyngeal Nasopharyngeal Swab     Status: None   Collection Time: 11/23/19  4:02 AM   Specimen: Nasopharyngeal Swab  Result Value Ref Range Status   SARS Coronavirus 2 NEGATIVE NEGATIVE Final    Comment: (NOTE) SARS-CoV-2 target nucleic acids are NOT DETECTED.  The SARS-CoV-2 RNA is generally detectable in  upper and lower respiratory specimens during the acute phase of infection. The lowest concentration of SARS-CoV-2 viral copies this assay can detect is 250 copies / mL. A negative result does not preclude SARS-CoV-2 infection and should not be used as the sole basis for treatment or other patient management decisions.  A negative result may occur with improper specimen collection / handling, submission of specimen other than nasopharyngeal swab, presence of viral mutation(s) within the areas targeted by this assay, and inadequate number of viral copies (<250 copies / mL). A negative result must be combined with clinical observations, patient history, and epidemiological information.  Fact Sheet for Patients:   StrictlyIdeas.no  Fact Sheet for Healthcare Providers: BankingDealers.co.za  This test is not yet approved or  cleared by the [place] FDA and has been authorized for detection and/or diagnosis of SARS-CoV-2 by FDA under an Emergency Use Authorization (EUA).  This EUA will remain in effect (meaning this test can be used) for the duration of the COVID-19 declaration under Section 564(b)(1) of the Act, 21 U.S.C. section 360bbb-3(b)(1), unless the authorization is terminated or revoked sooner.  Performed at [facility], [place] [address] [place], [place] [address]   Culture, blood (Routine X 2) w Reflex to ID Panel     Status: None   Collection Time: 11/23/19  4:03 AM   Specimen: BLOOD RIGHT FOREARM  Result Value Ref Range Status   Specimen Description BLOOD RIGHT FOREARM  Final   Special Requests   Final    BOTTLES DRAWN AEROBIC AND ANAEROBIC Blood Culture adequate volume   Culture   Final    NO GROWTH 5 DAYS Performed at [facility], [place] [address], [place], [place] [address]    Report Status 11/28/2019 FINAL  Final  MRSA PCR Screening     Status: None   Collection Time: 11/23/19 10:26 AM   Specimen:  Nasopharyngeal  Result Value Ref Range Status   MRSA by PCR NEGATIVE NEGATIVE Final    Comment:        The GeneXpert MRSA Assay (FDA approved for NASAL specimens only), is one component of a comprehensive MRSA colonization surveillance program. It is not intended to diagnose MRSA infection nor to guide or monitor treatment for MRSA infections. Performed at [facility], [place] [address] [place], [place] [address]      Labs: BNP (last  3 results) No results for input(s): BNP in the last 8760 hours. Basic Metabolic Panel: Recent Labs  Lab 11/25/19 0308 11/25/19 1029 11/26/19 0145 11/26/19 0145 11/26/19 1447 11/27/19 0425 11/27/19 1955 11/28/19 0426 11/29/19 0703  NA 135   < > 135   < > 133* 135 135 138 136  K 4.4   < > 3.9   < > 3.9 3.9 4.1 4.0 4.2  CL 101   < > 102   < > 100 103 101 104 101  CO2 23   < > 24   < > 23 23 24 25 23   GLUCOSE 162*   < > 173*   < > 181* 167* 129* 131* 135*  BUN 11   < > 14   < > 17 20 25* 26* 28*  CREATININE 1.71*   < > 2.31*   < > 2.61* 2.69* 2.70* 2.63* 2.60*  CALCIUM 8.4*   < > 8.5*   < > 8.4* 8.7* 8.6* 8.7* 8.8*  MG 2.4  --  2.4  --   --  2.0  --  1.9 1.8  PHOS 2.1*  --  1.9*   < > 1.5* 2.0* 2.0* 2.3* 2.9   < > = values in this interval not displayed.   Liver Function Tests: Recent Labs  Lab 11/23/19 0147 11/23/19 1605 11/26/19 1447 11/27/19 0425 11/27/19 1955 11/28/19 0426 11/29/19 0703  AST 17  --   --   --   --   --   --   ALT 7  --   --   --   --   --   --   ALKPHOS 85  --   --   --   --   --   --   BILITOT 0.8  --   --   --   --   --   --   PROT 6.1*  --   --   --   --   --   --   ALBUMIN 3.2*   < > 2.8* 2.6* 2.7* 2.6* 2.8*   < > = values in this interval not displayed.   No results for input(s): LIPASE, AMYLASE in the last 168 hours. No results for input(s): AMMONIA in the last 168 hours. CBC: Recent Labs  Lab 11/23/19 0147 11/23/19 0203 11/23/19 0444 11/24/19 0817 11/27/19 1107 11/28/19 0426  11/29/19 0703  WBC 20.1*   < > 23.3* 17.1* 8.1 9.2 9.9  NEUTROABS 16.8*  --   --   --   --   --   --   HGB 10.3*   < > 9.4* 9.1* 8.6* 8.5* 8.8*  HCT 36.0   < > 32.4* 27.0* 26.8* 26.6* 27.2*  MCV 111.8*   < > 107.6* 94.4 99.3 100.4* 100.0  PLT 302   < > 258 196 110* 116* 138*   < > = values in this interval not displayed.   Cardiac Enzymes: Recent Labs  Lab 11/23/19 0939  CKTOTAL 850*   BNP: Invalid input(s): POCBNP CBG: Recent Labs  Lab 11/28/19 0701 11/28/19 1127 11/28/19 1653 11/28/19 2030 11/29/19 0624  GLUCAP 173* 189* 149* 134* 133*   D-Dimer No results for input(s): DDIMER in the last 72 hours. Hgb A1c No results for input(s): HGBA1C in the last 72 hours. Lipid Profile No results for input(s): CHOL, HDL, LDLCALC, TRIG, CHOLHDL, LDLDIRECT in the last 72 hours. Thyroid function studies No results for input(s): TSH, T4TOTAL, T3FREE, THYROIDAB in  the last 72 hours.  Invalid input(s): FREET3 Anemia work up Recent Labs    11/28/19 1147  VITAMINB12 1,872*  FOLATE 5.5*  FERRITIN 93  TIBC 330  IRON 30  RETICCTPCT 1.3   Urinalysis    Component Value Date/Time   COLORURINE AMBER (A) 11/23/2019 0239   APPEARANCEUR CLOUDY (A) 11/23/2019 0239   LABSPEC 1.016 11/23/2019 0239   PHURINE 5.0 11/23/2019 0239   GLUCOSEU NEGATIVE 11/23/2019 0239   HGBUR LARGE (A) 11/23/2019 0239   BILIRUBINUR NEGATIVE 11/23/2019 0239   KETONESUR NEGATIVE 11/23/2019 0239   PROTEINUR 30 (A) 11/23/2019 0239   NITRITE NEGATIVE 11/23/2019 0239   LEUKOCYTESUR NEGATIVE 11/23/2019 0239   Sepsis Labs Invalid input(s): PROCALCITONIN,  WBC,  LACTICIDVEN Microbiology Recent Results (from the past 240 hour(s))  Urine culture     Status: None   Collection Time: 11/23/19  2:39 AM   Specimen: In/Out Cath Urine  Result Value Ref Range Status   Specimen Description IN/OUT CATH URINE  Final   Special Requests NONE  Final   Culture   Final    NO GROWTH Performed at [facility],  [place] [address], [place], [place] [address]    Report Status 11/24/2019 FINAL  Final  Blood Culture (routine x 2)     Status: None   Collection Time: 11/23/19  3:07 AM   Specimen: BLOOD  Result Value Ref Range Status   Specimen Description BLOOD RIGHT ANTECUBITAL  Final   Special Requests   Final    BOTTLES DRAWN AEROBIC AND ANAEROBIC Blood Culture adequate volume   Culture   Final    NO GROWTH 5 DAYS Performed at [facility], [place] [address], [place], [place] [address]    Report Status 11/28/2019 FINAL  Final  SARS Coronavirus 2 by RT PCR (hospital order, performed in [facility] hospital lab) Nasopharyngeal Nasopharyngeal Swab     Status: None   Collection Time: 11/23/19  4:02 AM   Specimen: Nasopharyngeal Swab  Result Value Ref Range Status   SARS Coronavirus 2 NEGATIVE NEGATIVE Final    Comment: (NOTE) SARS-CoV-2 target nucleic acids are NOT DETECTED.  The SARS-CoV-2 RNA is generally detectable in upper and lower respiratory specimens during the acute phase of infection. The lowest concentration of SARS-CoV-2 viral copies this assay can detect is 250 copies / mL. A negative result does not preclude SARS-CoV-2 infection and should not be used as the sole basis for treatment or other patient management decisions.  A negative result may occur with improper specimen collection / handling, submission of specimen other than nasopharyngeal swab, presence of viral mutation(s) within the areas targeted by this assay, and inadequate number of viral copies (<250 copies / mL). A negative result must be combined with clinical observations, patient history, and epidemiological information.  Fact Sheet for Patients:   StrictlyIdeas.no  Fact Sheet for Healthcare Providers: BankingDealers.co.za  This test is not yet approved or  cleared by the [place] FDA and has been authorized for detection and/or diagnosis of SARS-CoV-2 by FDA under  an Emergency Use Authorization (EUA).  This EUA will remain in effect (meaning this test can be used) for the duration of the COVID-19 declaration under Section 564(b)(1) of the Act, 21 U.S.C. section 360bbb-3(b)(1), unless the authorization is terminated or revoked sooner.  Performed at [facility], [place] [address] [place], [place] [address]   Culture, blood (Routine X 2) w Reflex to ID Panel     Status:  None   Collection Time: 11/23/19  4:03 AM   Specimen: BLOOD RIGHT FOREARM  Result Value Ref Range Status   Specimen Description BLOOD RIGHT FOREARM  Final   Special Requests   Final    BOTTLES DRAWN AEROBIC AND ANAEROBIC Blood Culture adequate volume   Culture   Final    NO GROWTH 5 DAYS Performed at [facility], [address] [place], [place] [address]    Report Status 11/28/2019 FINAL  Final  MRSA PCR Screening     Status: None   Collection Time: 11/23/19 10:26 AM   Specimen: Nasopharyngeal  Result Value Ref Range Status   MRSA by PCR NEGATIVE NEGATIVE Final    Comment:        The GeneXpert MRSA Assay (FDA approved for NASAL specimens only), is one component of a comprehensive MRSA colonization surveillance program. It is not intended to diagnose MRSA infection nor to guide or monitor treatment for MRSA infections. Performed at [facility], [place] [address], [place], [place] [address]      Time coordinating discharge: 40 minutes  SIGNED:   Elmarie Shiley, MD  [facility]

## 2019-11-29 NOTE — Progress Notes (Signed)
DISCHARGE NOTE HOME Paula Wheeler to be discharged Home per MD order. Discussed prescriptions and follow up appointments with the patient. Prescriptions given to patient; medication list explained in detail. Patient verbalized understanding.  Skin clean, dry and intact without evidence of skin break down, no evidence of skin tears noted. IV catheter discontinued intact. Site without signs and symptoms of complications. Dressing and pressure applied. Pt denies pain at the site currently. No complaints noted.  Patient free of lines, drains, and wounds.   An After Visit Summary (AVS) was printed and given to the patient. Patient escorted via wheelchair, and discharged home via private auto.  Arlyss Repress, RN

## 2019-11-29 NOTE — Progress Notes (Signed)
Admit: 11/23/2019 LOS: 6  Paula Wheeler AoCKD with severe metabolic acidosis, lactic and ketoacidosis  Subjective:  . BUN and crt stable since 7/31- at baseline, K4.0, bicarbonate 23 . 1200 of urine . Feels good, out of bed-  Thinks she might go home today  .  temporary HD catheter removed  08/01 0701 - 08/02 0700 In: 1060 [P.O.:1060] Out: 1200 [Urine:1200]  Filed Weights   11/24/19 0414 11/25/19 0420 11/26/19 0500  Weight: (!) 99.3 kg (!) 98.8 kg (!) 97.6 kg    Scheduled Meds: . apixaban  5 mg Oral BID  . B-complex with vitamin C  1 tablet Oral Daily  . bisoprolol  5 mg Oral Daily  . Chlorhexidine Gluconate Cloth  6 each Topical Daily  . clotrimazole  1 Applicatorful Vaginal QHS  . feeding supplement (ENSURE ENLIVE)  237 mL Oral BID BM  . ferrous sulfate  325 mg Oral Q breakfast  . folic acid  1 mg Oral Daily  . guaiFENesin  600 mg Oral BID  . insulin aspart  0-9 Units Subcutaneous TID WC  . insulin detemir  6 Units Subcutaneous BID  . sodium chloride flush  10-40 mL Intracatheter Q12H   Continuous Infusions: . sodium chloride Stopped (11/24/19 0605)   PRN Meds:.sodium chloride, albuterol, dextrose, docusate sodium, heparin, HYDROmorphone (DILAUDID) injection, phenol, polyethylene glycol, sodium chloride flush  Current Labs: reviewed    Physical Exam:  Blood pressure 126/85, pulse 88, temperature 98.5 F (36.9 C), temperature source Oral, resp. rate 18, height 5\' 5"  (1.651 m), weight (!) 97.6 kg, SpO2 99 %. NAD, hoarse NCAT RRR Coarse BS b/l S/nt/nd  R IJ Temp Cath bandaged Mottled cold feet    A 1. Resolving dialysis dependent AoCKD4;  1. CRRT 7/27 - 7/29 2. BL SCr 2.6 08/2019, appears acute issue is resolving to baseline 3. Follows with Hollie Salk at [facility]-  I will make sure getting labs and follow up  2. Shock,  resolved 3. PNA, improving, ceftriaxone, per TRH 4. Resolved severe AG metabolic acidosis, inc lactate +BHB: ? DKA ? Metformin toxicity; on metformin at admit,  should never be restarted; improved.   5. Hyperkalemia, resolved 6. Hypophosphatemia, stable, borderline low, continue to monitor 7. DM2, hyperglycemia 8. Severe PVD, VVS is followed, no acute issues 9. COPD 10. Anemia, hb 9s- no meds given here-  May need ESA as OP -  Will check follow up labs 11. Ongoing tobaccl user, patient plans to stop as of now, encouraged  P I have no issue with discharge today-  Would send out on some diuretic-  Torsemide 20 daily.  Will arrange OP labs and follow up with Cathren Harsh  11/29/2019, 11:13 AM  Recent Labs  Lab 11/27/19 1955 11/28/19 0426 11/29/19 0703  NA 135 138 136  K 4.1 4.0 4.2  CL 101 104 101  CO2 24 25 23   GLUCOSE 129* 131* 135*  BUN 25* 26* 28*  CREATININE 2.70* 2.63* 2.60*  CALCIUM 8.6* 8.7* 8.8*  PHOS 2.0* 2.3* 2.9   Recent Labs  Lab 11/23/19 0147 11/23/19 0203 11/27/19 1107 11/28/19 0426 11/29/19 0703  WBC 20.1*   < > 8.1 9.2 9.9  NEUTROABS 16.8*  --   --   --   --   HGB 10.3*   < > 8.6* 8.5* 8.8*  HCT 36.0   < > 26.8* 26.6* 27.2*  MCV 111.8*   < > 99.3 100.4* 100.0  PLT 302   < >  110* 116* 138*   < > = values in this interval not displayed.

## 2019-11-29 NOTE — TOC Initial Note (Signed)
Transition of Care [facility]) - Initial/Assessment Note    Patient Details  Name: Paula Wheeler MRN: [id number] Date of Birth: January 14, 1951  [facility]) CM/SW Contact:    Paula Crews, RN Phone Number: [phone] 11/29/2019, 2:07 PM  Clinical Narrative:                  Spoke with patient on the phone to discuss transition planning. Patient will be going home with her brother, Paula Wheeler, who lives at [address] [place], [place] [address].   Discussed DME needs for 3N1 and RW. Referral to [employer] - will deliver to bedside.   Discussed HH needs for RN, PT, OT. Patient understands how to do dressing change, and states her brother and sister n law have had to do dressing changes for her brother in the past. She is agreeable to [facility] follow up. Pending agency acceptance.   Family to provided transportation home.   Expected Discharge Plan: [place]     Patient Goals and CMS Choice   CMS Medicare.gov Compare Post Acute Care list provided to:: Patient Choice offered to / list presented to : Patient  Expected Discharge Plan and Services Expected Discharge Plan: [place] In-house Referral: NA Discharge Planning Services: CM Consult Post Acute Care Choice: Durable Medical Equipment, Home Health Living arrangements for the past 2 months: Single Family Home Expected Discharge Date: 11/29/19               DME Arranged: Berta Minor DME Agency: [employer] Date DME Agency Contacted: 11/29/19 Time DME Agency Contacted: [phone] Representative spoke with at DME Agency: [place] Arranged: RN, OT, PT          Prior Living Arrangements/Services Living arrangements for the past 2 months: [place] with:: Self Patient language and need for interpreter reviewed:: Yes        Need for Family Participation in Patient Care: Yes (Comment) Care giver support system in place?: Yes (comment)   Criminal  Activity/Legal Involvement Pertinent to Current Situation/Hospitalization: No - Comment as needed  Activities of Daily Living Home Assistive Devices/Equipment: Walker (specify type) ADL Screening (condition at time of admission) Patient's cognitive ability adequate to safely complete daily activities?: Yes Is the patient deaf or have difficulty hearing?: No Does the patient have difficulty seeing, even when wearing glasses/contacts?: No Does the patient have difficulty concentrating, remembering, or making decisions?: No Patient able to express need for assistance with ADLs?: Yes Does the patient have difficulty dressing or bathing?: No Independently performs ADLs?: Yes (appropriate for developmental age) Does the patient have difficulty walking or climbing stairs?: No Weakness of Legs: None Weakness of Arms/Hands: None  Permission Sought/Granted                  Emotional Assessment Appearance:: Appears stated age Attitude/Demeanor/Rapport: Engaged Affect (typically observed): Accepting Orientation: : Oriented to Self, Oriented to Place, Oriented to  Time, Oriented to Situation Alcohol / Substance Use: Not Applicable Psych Involvement: No (comment)  Admission diagnosis:  Metabolic acidosis [E70.3] Acute renal failure (ARF) (HCC) [N17.9] Sepsis, due to unspecified organism, unspecified whether acute organ dysfunction present [facility]) [A41.9] Patient Active Problem List   Diagnosis Date Noted  . Pressure injury of skin 11/28/2019  . Acute renal failure (ARF) ([place]) 11/23/2019  . Lactic acidosis   . Hyperkalemia   . Acute encephalopathy   . Sepsis ([place])   . Shock circulatory ([place])   .  Acute renal failure superimposed on stage 4 chronic kidney disease ([place]) 01/29/2018  . Type II diabetes mellitus with renal manifestations ([place]) 01/29/2018  . GERD (gastroesophageal reflux disease) 01/29/2018  . Depression 01/29/2018  . Tobacco abuse 01/29/2018  . Elevated troponin 01/29/2018   . Leukocytosis 01/29/2018  . Chronic diastolic CHF (congestive heart failure) ([place]) 01/29/2018  . CKD (chronic kidney disease) stage 4, GFR 15-29 ml/min (HCC)   . SOB (shortness of breath)   . PAD (peripheral artery disease) ([place]) 12/23/2017  . Persistent atrial fibrillation 06/10/2016  . GI bleed 05/14/2016  . Symptomatic anemia 05/14/2016  . Diabetes mellitus with complication ([place]) 32/95/1884  . Essential hypertension 05/14/2016  . HLD (hyperlipidemia) 05/14/2016  . Anemia   . Renal insufficiency    PCP:  Hulan Fess, MD Pharmacy:   [facility], [place] - [address] [place] [address] Phone: [phone] Fax: [phone]     Social Determinants of Health (SDOH) Interventions    Readmission Risk Interventions No flowsheet data found.

## 2019-11-29 NOTE — Consult Note (Signed)
[place] Nurse Consult Note: Patient receiving care in [facility] [phone]. Reason for Consult: LLE wound Wound type: Possible vasculitis, or vasopressor induced Pressure Injury POA: Yes/No/NA Measurement: Wound bed: Drainage (amount, consistency, odor)  Periwound: Dressing procedure/placement/frequency: I assisted helping the primary RN find the existing dressing instructions for bilateral LE wounds consisting of Xeroform gauze and foam dressings.  Thank you for the consult.  Discussed plan of care with the bedside nurse.  [place] nurse will not follow at this time.  Please re-consult the [place] team if needed.  Val Riles, RN, MSN, CWOCN, CNS-BC, pager [phone]

## 2019-11-29 NOTE — TOC Transition Note (Addendum)
Transition of Care [facility]) - CM/SW Discharge Note   Patient Details  Name: [name] MRN: [id number] Date of Birth: [birth year]  Transition of Care [facility]) CM/SW Contact:  Bartholomew Crews, RN Phone Number: [phone] 11/29/2019, 2:38 PM   Clinical Narrative:     Referral for [facility] accepted by [facility] for RN, PT, OT with start of care for tomorrow. MD placed [place] orders. No further TOC needs identified.   Update: Spoke with patient at the bedside to advise of accepting home health agency. Verified mobile phone to be correct.   Final next level of care: Home w Home Health Services Barriers to Discharge: No Barriers Identified   Patient Goals and CMS Choice   CMS Medicare.gov Compare Post Acute Care list provided to:: Patient Choice offered to / list presented to : Patient  Discharge Placement                       Discharge Plan and Services In-house Referral: NA Discharge Planning Services: CM Consult Post Acute Care Choice: Durable Medical Equipment, Home Health          DME Arranged: 3-N-1, Gilford Rile DME Agency: [employer] Date DME Agency Contacted: 11/29/19 Time DME Agency Contacted: [phone] Representative spoke with at DME Agency: Thedore Mins HH Arranged: RN, OT, PT [facility] Agency: [place] Date [place]: 11/29/19 Time [place]: 1437 Representative spoke with at [place]: [place] ([place]) Interventions     Readmission Risk Interventions No flowsheet data found.

## 2020-01-29 ENCOUNTER — Other Ambulatory Visit: Payer: Self-pay | Admitting: Cardiovascular Disease

## 2020-01-29 DIAGNOSIS — I4891 Unspecified atrial fibrillation: Secondary | ICD-10-CM

## 2020-01-29 DIAGNOSIS — I739 Peripheral vascular disease, unspecified: Secondary | ICD-10-CM

## 2020-03-02 ENCOUNTER — Other Ambulatory Visit: Payer: Self-pay

## 2020-03-02 ENCOUNTER — Encounter (HOSPITAL_BASED_OUTPATIENT_CLINIC_OR_DEPARTMENT_OTHER): Payer: BC Managed Care – PPO | Attending: Internal Medicine | Admitting: Internal Medicine

## 2020-03-02 DIAGNOSIS — E1151 Type 2 diabetes mellitus with diabetic peripheral angiopathy without gangrene: Secondary | ICD-10-CM | POA: Insufficient documentation

## 2020-03-02 DIAGNOSIS — E11621 Type 2 diabetes mellitus with foot ulcer: Secondary | ICD-10-CM | POA: Diagnosis not present

## 2020-03-02 DIAGNOSIS — I87323 Chronic venous hypertension (idiopathic) with inflammation of bilateral lower extremity: Secondary | ICD-10-CM | POA: Insufficient documentation

## 2020-03-02 DIAGNOSIS — L97421 Non-pressure chronic ulcer of left heel and midfoot limited to breakdown of skin: Secondary | ICD-10-CM | POA: Diagnosis not present

## 2020-03-02 DIAGNOSIS — L97811 Non-pressure chronic ulcer of other part of right lower leg limited to breakdown of skin: Secondary | ICD-10-CM | POA: Diagnosis not present

## 2020-03-02 DIAGNOSIS — E1142 Type 2 diabetes mellitus with diabetic polyneuropathy: Secondary | ICD-10-CM | POA: Diagnosis not present

## 2020-03-03 NOTE — Progress Notes (Signed)
[name], [name] ([id number]) Visit Report for 03/02/2020 Allergy List Details Patient Name: Date of Service: Paula Wheeler A. 03/02/2020 1:15 PM Medical Record Number: [id number] Patient Account Number: [id number] Date of Birth/Sex: Treating RN: [birth year] (69 y.o. Orvan Falconer Primary Care Shadoe Bethel: Nicholes Stairs Other Clinician: Referring Juma Oxley: Treating Tameaka Eichhorn/Extender: Nyra Jabs in Treatment: 0 Allergies Active Allergies penicillin Allergy Notes Electronic Signature(s) Signed: 03/03/2020 5:00:24 PM By: Carlene Coria RN Entered By: Carlene Coria on 03/02/2020 13:44:06 -------------------------------------------------------------------------------- Arrival Information Details Patient Name: Date of Service: Paula Wheeler RE, Paula Wheeler A. 03/02/2020 1:15 PM Medical Record Number: [id number] Patient Account Number: [id number] Date of Birth/Sex: Treating RN: 11-03-1950 (69 y.o. Orvan Falconer Primary Care Caydn Justen: Nicholes Stairs Other Clinician: Referring Paula Wheeler: Treating Norris Bodley/Extender: Nyra Jabs in Treatment: 0 Visit Information Patient Arrived: Ambulatory Arrival Time: 13:19 Accompanied By: self Transfer Assistance: None Patient Identification Verified: Yes Secondary Verification Process Completed: Yes Patient Requires Transmission-Based Precautions: No Patient Has Alerts: No Electronic Signature(s) Signed: 03/03/2020 5:00:24 PM By: Carlene Coria RN Entered By: Carlene Coria on 03/02/2020 13:43:05 -------------------------------------------------------------------------------- Clinic Level of Care Assessment Details Patient Name: Date of Service: Paula Wheeler A. 03/02/2020 1:15 PM Medical Record Number: [id number] Patient Account Number: [id number] Date of Birth/Sex: Treating RN: 03-Dec-1950 (69 y.o. Helene Shoe, Paula Wheeler Primary Care Paula Wheeler: Nicholes Stairs Other Clinician: Referring  Montrail Mehrer: Treating Paula Wheeler/Extender: Nyra Jabs in Treatment: 0 Clinic Level of Care Assessment Items TOOL 2 Quantity Score X- 1 0 Use when only an EandM is performed on the INITIAL visit ASSESSMENTS - Nursing Assessment / Reassessment X- 1 20 General Physical Exam (combine w/ comprehensive assessment (listed just below) when performed on new pt. evals) X- 1 25 Comprehensive Assessment (HX, ROS, Risk Assessments, Wounds Hx, etc.) ASSESSMENTS - Wound and Skin A ssessment / Reassessment []  - 0 Simple Wound Assessment / Reassessment - one wound X- 2 5 Complex Wound Assessment / Reassessment - multiple wounds X- 1 10 Dermatologic / Skin Assessment (not related to wound area) ASSESSMENTS - Ostomy and/or Continence Assessment and Care []  - 0 Incontinence Assessment and Management []  - 0 Ostomy Care Assessment and Management (repouching, etc.) PROCESS - Coordination of Care []  - 0 Simple Patient / Family Education for ongoing care X- 1 20 Complex (extensive) Patient / Family Education for ongoing care X- 1 10 Staff obtains Programmer, systems, Records, T Results / Process Orders est []  - 0 Staff telephones HHA, Nursing Homes / Clarify orders / etc []  - 0 Routine Transfer to another Facility (non-emergent condition) []  - 0 Routine Hospital Admission (non-emergent condition) X- 1 15 New Admissions / Biomedical engineer / Ordering NPWT Apligraf, etc. , []  - 0 Emergency Hospital Admission (emergent condition) []  - 0 Simple Discharge Coordination X- 1 15 Complex (extensive) Discharge Coordination PROCESS - Special Needs []  - 0 Pediatric / Minor Patient Management []  - 0 Isolation Patient Management []  - 0 Hearing / Language / Visual special needs []  - 0 Assessment of Community assistance (transportation, D/C planning, etc.) []  - 0 Additional assistance / Altered mentation []  - 0 Support Surface(s) Assessment (bed, cushion, seat,  etc.) INTERVENTIONS - Wound Cleansing / Measurement X- 1 5 Wound Imaging (photographs - any number of wounds) []  - 0 Wound Tracing (instead of photographs) []  - 0 Simple Wound Measurement - one wound X- 2 5 Complex Wound Measurement - multiple wounds []  - 0 Simple Wound Cleansing -  one wound X- 2 5 Complex Wound Cleansing - multiple wounds INTERVENTIONS - Wound Dressings []  - 0 Small Wound Dressing one or multiple wounds []  - 0 Medium Wound Dressing one or multiple wounds X- 2 20 Large Wound Dressing one or multiple wounds []  - 0 Application of Medications - injection INTERVENTIONS - Miscellaneous []  - 0 External ear exam []  - 0 Specimen Collection (cultures, biopsies, blood, body fluids, etc.) []  - 0 Specimen(s) / Culture(s) sent or taken to Lab for analysis []  - 0 Patient Transfer (multiple staff / Harrel Lemon Lift / Similar devices) []  - 0 Simple Staple / Suture removal (25 or less) []  - 0 Complex Staple / Suture removal (26 or more) []  - 0 Hypo / Hyperglycemic Management (close monitor of Blood Glucose) X- 1 15 Ankle / Brachial Index (ABI) - do not check if billed separately Has the patient been seen at the hospital within the last three years: Yes Total Score: 205 Level Of Care: New/Established - Level 5 Electronic Signature(s) Signed: 03/02/2020 6:08:00 PM By: Deon Pilling Entered By: Deon Pilling on 03/02/2020 14:36:34 -------------------------------------------------------------------------------- Encounter Discharge Information Details Patient Name: Date of Service: Paula Wheeler Wheeler A. 03/02/2020 1:15 PM Medical Record Number: [id number] Patient Account Number: [id number] Date of Birth/Sex: Treating RN: [birth year] (69 y.o. Paula Wheeler Primary Care Glenda Kunst: Nicholes Stairs Other Clinician: Referring Anav Lammert: Treating Giulian Goldring/Extender: Nyra Jabs in Treatment: 0 Encounter Discharge Information Items Discharge Condition:  Stable Ambulatory Status: Ambulatory Discharge Destination: Home Transportation: Private Auto Accompanied By: self Schedule Follow-up Appointment: Yes Clinical Summary of Care: Patient Declined Electronic Signature(s) Signed: 03/02/2020 5:54:34 PM By: Baruch Gouty RN, BSN Entered By: Baruch Gouty on 03/02/2020 14:58:13 -------------------------------------------------------------------------------- Lower Extremity Assessment Details Patient Name: Date of Service: Paula Wheeler RE, Paula Wheeler A. 03/02/2020 1:15 PM Medical Record Number: [id number] Patient Account Number: [id number] Date of Birth/Sex: Treating RN: [birth year] (69 y.o. Orvan Falconer Primary Care Kanden Carey: Nicholes Stairs Other Clinician: Referring Shawnna Pancake: Treating Adalene Gulotta/Extender: Nyra Jabs in Treatment: 0 Edema Assessment Assessed: Shirlyn Goltz: No] [Right: No] E[Left: dema] [Right: :] Calf Left: Right: Point of Measurement: 40 cm From Medial Instep 43 cm 43.5 cm Ankle Left: Right: Point of Measurement: 10 cm From Medial Instep 23 cm 22 cm Electronic Signature(s) Signed: 03/03/2020 5:00:24 PM By: Carlene Coria RN Entered By: Carlene Coria on 03/02/2020 14:01:17 -------------------------------------------------------------------------------- Multi Wound Chart Details Patient Name: Date of Service: Mancel Bale Wheeler A. 03/02/2020 1:15 PM Medical Record Number: [id number] Patient Account Number: [id number] Date of Birth/Sex: Treating RN: March 05, 1951 (69 y.o. Helene Shoe, Meta.Reding Primary Care Mackey Varricchio: Nicholes Stairs Other Clinician: Referring Flo Berroa: Treating Donnita Farina/Extender: Nyra Jabs in Treatment: 0 Vital Signs Height(in): 49 Pulse(bpm): 54 Weight(lbs): 60 Blood Pressure(mmHg): 165/73 Body Mass Index(BMI): 38 Temperature(F): 98.5 Respiratory Rate(breaths/min): 18 Photos: [1:No Photos Left Calcaneus] [2:No Photos Right Lower Leg] [N/A:N/A N/A] Wound  Location: [1:Gradually Appeared] [2:Gradually Appeared] [N/A:N/A] Wounding Event: [1:Diabetic Wound/Ulcer of the Lower] [2:Pressure Ulcer] [N/A:N/A] Primary Etiology: [1:Extremity Chronic Obstructive Pulmonary] [2:Chronic Obstructive Pulmonary] [N/A:N/A] Comorbid History: [1:Disease (COPD), Congestive Heart Disease (COPD), Congestive Heart Failure, Hypertension, Type II Diabetes 11/17/2019] [2:Failure, Hypertension, Type II Diabetes 11/10/2019] [N/A:N/A] Date Acquired: [1:0] [2:0] [N/A:N/A] Weeks of Treatment: [1:Open] [2:Open] [N/A:N/A] Wound Status: [1:0.6x1.2x0.1] [2:0.3x0.9x0.1] [N/A:N/A] Measurements L x W x D (cm) [1:0.565] [2:0.212] [N/A:N/A] A (cm) : rea [1:0.057] [2:0.021] [N/A:N/A] Volume (cm) : [1:Grade 2] [2:Category/Stage II] [N/A:N/A] Classification: [1:Medium] [2:Medium] [N/A:N/A] Exudate  A mount: [1:Serosanguineous] [2:Serosanguineous] [N/A:N/A] Exudate Type: [1:red, brown] [2:red, brown] [N/A:N/A] Exudate Color: [1:Large (67-100%)] [2:Medium (34-66%)] [N/A:N/A] Granulation A mount: [1:Red] [2:Pink, Pale] [N/A:N/A] Granulation Quality: [1:None Present (0%)] [2:Medium (34-66%)] [N/A:N/A] Necrotic A mount: [1:Fat Layer (Subcutaneous Tissue): Yes Fat Layer (Subcutaneous Tissue): Yes N/A] Exposed Structures: [1:Fascia: No Tendon: No Muscle: No Joint: No Bone: No None] [2:Fascia: No Tendon: No Muscle: No Joint: No Bone: No None] [N/A:N/A] Treatment Notes Electronic Signature(s) Signed: 03/02/2020 5:37:49 PM By: Linton Ham MD Signed: 03/02/2020 6:08:00 PM By: Deon Pilling Signed: 03/02/2020 6:08:00 PM By: Deon Pilling Entered By: Linton Ham on 03/02/2020 14:46:26 -------------------------------------------------------------------------------- Multi-Disciplinary Care Plan Details Patient Name: Date of Service: Paula Wheeler RE, Paula Wheeler A. 03/02/2020 1:15 PM Medical Record Number: [id number] Patient Account Number: [id number] Date of Birth/Sex: Treating RN: 10-Aug-1950 (68  y.o. Helene Shoe, Paula Wheeler Primary Care Jearlene Bridwell: Nicholes Stairs Other Clinician: Referring Joanette Silveria: Treating Tiesha Marich/Extender: Nyra Jabs in Treatment: 0 Active Inactive Nutrition Nursing Diagnoses: Potential for alteratiion in Nutrition/Potential for imbalanced nutrition Goals: Patient/caregiver agrees to and verbalizes understanding of need to obtain nutritional consultation Date Initiated: 03/02/2020 Target Resolution Date: 04/07/2020 Goal Status: Active Interventions: Provide education on elevated blood sugars and impact on wound healing Provide education on nutrition Treatment Activities: Patient referred to Primary Care Physician for further nutritional evaluation : 03/02/2020 Notes: Orientation to the Wound Care Program Nursing Diagnoses: Knowledge deficit related to the wound healing center program Goals: Patient/caregiver will verbalize understanding of the [place] Date Initiated: 03/02/2020 Target Resolution Date: 04/07/2020 Goal Status: Active Interventions: Provide education on orientation to the wound center Notes: Pain, Acute or Chronic Nursing Diagnoses: Pain, acute or chronic: actual or potential Potential alteration in comfort, pain Goals: Patient will verbalize adequate pain control and receive pain control interventions during procedures as needed Date Initiated: 03/02/2020 Target Resolution Date: 04/07/2020 Goal Status: Active Patient/caregiver will verbalize comfort level met Date Initiated: 03/02/2020 Target Resolution Date: 04/07/2020 Goal Status: Active Interventions: Encourage patient to take pain medications as prescribed Provide education on pain management Reposition patient for comfort Treatment Activities: Administer pain control measures as ordered : 03/02/2020 Notes: Wound/Skin Impairment Nursing Diagnoses: Knowledge deficit related to ulceration/compromised skin  integrity Goals: Patient/caregiver will verbalize understanding of skin care regimen Date Initiated: 03/02/2020 Target Resolution Date: 04/07/2020 Goal Status: Active Interventions: Assess patient/caregiver ability to perform ulcer/skin care regimen upon admission and as needed Provide education on ulcer and skin care Treatment Activities: Skin care regimen initiated : 03/02/2020 Topical wound management initiated : 03/02/2020 Notes: Electronic Signature(s) Signed: 03/02/2020 6:08:00 PM By: Deon Pilling Entered By: Deon Pilling on 03/02/2020 13:18:53 -------------------------------------------------------------------------------- Pain Assessment Details Patient Name: Date of Service: Paula Wheeler A. 03/02/2020 1:15 PM Medical Record Number: [id number] Patient Account Number: [id number] Date of Birth/Sex: Treating RN: 1951-01-22 (69 y.o. Orvan Falconer Primary Care Bruna Dills: Nicholes Stairs Other Clinician: Referring Maudie Shingledecker: Treating Willies Laviolette/Extender: Nyra Jabs in Treatment: 0 Active Problems Location of Pain Severity and Description of Pain Patient Has Paino No Site Locations Pain Management and Medication Current Pain Management: Electronic Signature(s) Signed: 03/03/2020 5:00:24 PM By: Carlene Coria RN Entered By: Carlene Coria on 03/02/2020 14:08:28 -------------------------------------------------------------------------------- Patient/Caregiver Education Details Patient Name: Date of Service: Paula Dellia Nims A. 11/4/2021andnbsp1:15 PM Medical Record Number: [id number] Patient Account Number: [id number] Date of Birth/Gender: Treating RN: 21-Nov-1950 (69 y.o. Helene Shoe, Paula Wheeler Primary Care Physician: Nicholes Stairs Other Clinician: Referring Physician: Treating Physician/Extender: Dellia Nims  Alfonse Alpers in Treatment: 0 Education Assessment Education Provided To: Patient Education Topics Provided Elevated  Blood Sugar/ Impact on Healing: Handouts: Elevated Blood Sugars: How Do They Affect Wound Healing Methods: Explain/Verbal, Printed Responses: Reinforcements needed [place]: o Handouts: Welcome T [facility] [place] o Methods: Explain/Verbal, Printed Responses: Reinforcements needed Electronic Signature(s) Signed: 03/02/2020 6:08:00 PM By: Deon Pilling Entered By: Deon Pilling on 03/02/2020 13:25:40 -------------------------------------------------------------------------------- Wound Assessment Details Patient Name: Date of Service: Paula Wheeler A. 03/02/2020 1:15 PM Medical Record Number: [id number] Patient Account Number: [id number] Date of Birth/Sex: Treating RN: Oct 27, 1950 (69 y.o. Orvan Falconer Primary Care Leinaala Catanese: Nicholes Stairs Other Clinician: Referring Autrey Human: Treating Caliya Narine/Extender: Nyra Jabs in Treatment: 0 Wound Status Wound Number: 1 Primary Diabetic Wound/Ulcer of the Lower Extremity Etiology: Wound Location: Left Calcaneus Wound Open Wounding Event: Gradually Appeared Status: Date Acquired: 11/17/2019 Comorbid Chronic Obstructive Pulmonary Disease (COPD), Congestive Weeks Of Treatment: 0 History: Heart Failure, Hypertension, Type II Diabetes Clustered Wound: No Wound Measurements Length: (cm) 0.6 Width: (cm) 1.2 Depth: (cm) 0.1 Area: (cm) 0.565 Volume: (cm) 0.057 % Reduction in Area: % Reduction in Volume: Epithelialization: None Tunneling: No Undermining: No Wound Description Classification: Grade 2 Exudate Amount: Medium Exudate Type: Serosanguineous Exudate Color: red, brown Foul Odor After Cleansing: No Slough/Fibrino No Wound Bed Granulation Amount: Large (67-100%) Exposed Structure Granulation Quality: Red Fascia Exposed: No Necrotic Amount: None Present (0%) Fat Layer (Subcutaneous Tissue) Exposed: Yes Tendon Exposed: No Muscle Exposed: No Joint Exposed:  No Bone Exposed: No Treatment Notes Wound #1 (Left Calcaneus) 2. Periwound Care Moisturizing lotion TCA Cream 3. Primary Dressing Applied Collegen AG 4. Secondary Dressing Dry Gauze Heel Cup 6. Support Layer Holiday representative) Signed: 03/03/2020 5:00:24 PM By: Carlene Coria RN Entered By: Carlene Coria on 03/02/2020 14:06:08 -------------------------------------------------------------------------------- Wound Assessment Details Patient Name: Date of Service: Paula Wheeler A. 03/02/2020 1:15 PM Medical Record Number: [id number] Patient Account Number: [id number] Date of Birth/Sex: Treating RN: 04/16/1951 (69 y.o. Orvan Falconer Primary Care Tamiya Colello: Nicholes Stairs Other Clinician: Referring Savon Cobbs: Treating Ambert Virrueta/Extender: Nyra Jabs in Treatment: 0 Wound Status Wound Number: 2 Primary Pressure Ulcer Etiology: Wound Location: Right Lower Leg Wound Open Wounding Event: Gradually Appeared Status: Date Acquired: 11/10/2019 Comorbid Chronic Obstructive Pulmonary Disease (COPD), Congestive Weeks Of Treatment: 0 History: Heart Failure, Hypertension, Type II Diabetes Clustered Wound: No Wound Measurements Length: (cm) 0.3 Width: (cm) 0.9 Depth: (cm) 0.1 Area: (cm) 0.212 Volume: (cm) 0.021 % Reduction in Area: % Reduction in Volume: Epithelialization: None Tunneling: No Undermining: No Wound Description Classification: Category/Stage II Exudate Amount: Medium Exudate Type: Serosanguineous Exudate Color: red, brown Foul Odor After Cleansing: No Slough/Fibrino No Wound Bed Granulation Amount: Medium (34-66%) Exposed Structure Granulation Quality: Pink, Pale Fascia Exposed: No Necrotic Amount: Medium (34-66%) Fat Layer (Subcutaneous Tissue) Exposed: Yes Necrotic Quality: Adherent Slough Tendon Exposed: No Muscle Exposed: No Joint Exposed: No Bone Exposed: No Treatment Notes Wound #2 (Right  Lower Leg) 2. Periwound Care Moisturizing lotion TCA Cream 3. Primary Dressing Applied Collegen AG 4. Secondary Dressing Dry Gauze 6. Support Layer Holiday representative) Signed: 03/03/2020 5:00:24 PM By: Carlene Coria RN Entered By: Carlene Coria on 03/02/2020 14:08:17 -------------------------------------------------------------------------------- [place] Details Patient Name: Date of Service: Paula Kathe Mariner, Paula Wheeler A. 03/02/2020 1:15 PM Medical Record Number: [id number] Patient Account Number: [id number] Date of Birth/Sex: Treating RN: [birth year] (69 y.o.  Orvan Falconer Primary Care Kingston Guiles: Nicholes Stairs Other Clinician: Referring Deonte Otting: Treating Zamira Hickam/Extender: Nyra Jabs in Treatment: 0 Vital Signs Time Taken: 13:43 Temperature (F): 98.5 Height (in): 65 Pulse (bpm): 65 Source: Stated Respiratory Rate (breaths/min): 18 Weight (lbs): 230 Blood Pressure (mmHg): 165/73 Source: Stated Reference Range: 80 - 120 mg / dl Body Mass Index (BMI): 38.3 Electronic Signature(s) Signed: 03/03/2020 5:00:24 PM By: Carlene Coria RN Entered By: Carlene Coria on 03/02/2020 13:43:44

## 2020-03-03 NOTE — Progress Notes (Signed)
[name], [name] ([id number]) Visit Report for 03/02/2020 Abuse/Suicide Risk Screen Details Patient Name: Date of Service: Paula Chard A. 03/02/2020 1:15 PM Medical Record Number: [id number] Patient Account Number: [id number] Date of Birth/Sex: Treating RN: [birth year] (69 y.o. Orvan Falconer Primary Care Damon Baisch: Nicholes Stairs Other Clinician: Referring Tenita Cue: Treating Deshonna Trnka/Extender: Nyra Jabs in Treatment: 0 Abuse/Suicide Risk Screen Items Answer ABUSE RISK SCREEN: Has anyone close to you tried to hurt or harm you recentlyo No Do you feel uncomfortable with anyone in your familyo No Has anyone forced you do things that you didnt want to doo No Electronic Signature(s) Signed: 03/03/2020 5:00:24 PM By: Carlene Coria RN Entered By: Carlene Coria on 03/02/2020 13:49:02 -------------------------------------------------------------------------------- Activities of Daily Living Details Patient Name: Date of Service: Paula Chard A. 03/02/2020 1:15 PM Medical Record Number: [id number] Patient Account Number: [id number] Date of Birth/Sex: Treating RN: 17-Mar-1951 (69 y.o. Orvan Falconer Primary Care Shalondra Wunschel: Nicholes Stairs Other Clinician: Referring Briyanna Billingham: Treating Turon Kilmer/Extender: Nyra Jabs in Treatment: 0 Activities of Daily Living Items Answer Activities of Daily Living (Please select one for each item) Drive Automobile Completely Able T Medications ake Completely Able Use T elephone Completely Able Care for Appearance Completely Able Use T oilet Completely Able Bath / Shower Completely Able Dress Self Completely Able Feed Self Completely Able Walk Completely Able Get In / Out Bed Completely Able Housework Completely Able Prepare Meals Completely [place] for Self Completely Able Electronic Signature(s) Signed: 03/03/2020 5:00:24 PM By: Carlene Coria RN Entered  By: Carlene Coria on 03/02/2020 13:49:26 -------------------------------------------------------------------------------- Education Screening Details Patient Name: Date of Service: MO Paula Rude BETH A. 03/02/2020 1:15 PM Medical Record Number: [id number] Patient Account Number: [id number] Date of Birth/Sex: Treating RN: 1950-06-29 (69 y.o. Orvan Falconer Primary Care Lorean Ekstrand: Nicholes Stairs Other Clinician: Referring Carmelle Bamberg: Treating Wadie Liew/Extender: Nyra Jabs in Treatment: 0 Learning Preferences/Education Level/Primary Language Learning Preference: Explanation Highest Education Level: High School Preferred Language: English Cognitive Barrier Language Barrier: No Translator Needed: No Memory Deficit: No Emotional Barrier: No Cultural/Religious Beliefs Affecting Medical Care: No Physical Barrier Impaired Vision: Yes Glasses Impaired Hearing: No Decreased Hand dexterity: No Knowledge/Comprehension Knowledge Level: Medium Comprehension Level: High Ability to understand written instructions: High Ability to understand verbal instructions: High Motivation Anxiety Level: Anxious Cooperation: Cooperative Education Importance: Acknowledges Need Interest in Health Problems: Asks Questions Perception: Coherent Willingness to Engage in Self-Management High Activities: Readiness to Engage in Self-Management High Activities: Electronic Signature(s) Signed: 03/03/2020 5:00:24 PM By: Carlene Coria RN Entered By: Carlene Coria on 03/02/2020 13:50:07 -------------------------------------------------------------------------------- Fall Risk Assessment Details Patient Name: Date of Service: MO Paula Rude BETH A. 03/02/2020 1:15 PM Medical Record Number: [id number] Patient Account Number: [id number] Date of Birth/Sex: Treating RN: 02/11/1951 (69 y.o. Orvan Falconer Primary Care Misty Foutz: Nicholes Stairs Other Clinician: Referring Nari Vannatter: Treating  Yeila Morro/Extender: Nyra Jabs in Treatment: 0 Fall Risk Assessment Items Have you had 2 or more falls in the last 12 monthso 0 No Have you had any fall that resulted in injury in the last 12 monthso 0 No FALLS RISK SCREEN History of falling - immediate or within 3 months 0 No Secondary diagnosis (Do you have 2 or more medical diagnoseso) 0 No Ambulatory aid None/bed rest/wheelchair/nurse 0 No Crutches/cane/walker 0 No Furniture 0 No Intravenous therapy Access/Saline/Heparin Lock 0 No Gait/Transferring Normal/ bed rest/  wheelchair 0 No Weak (short steps with or without shuffle, stooped but able to lift head while walking, may seek 0 No support from furniture) Impaired (short steps with shuffle, may have difficulty arising from chair, head down, impaired 0 No balance) Mental Status Oriented to own ability 0 No Electronic Signature(s) Signed: 03/03/2020 5:00:24 PM By: Carlene Coria RN Entered By: Carlene Coria on 03/02/2020 13:50:22 -------------------------------------------------------------------------------- Foot Assessment Details Patient Name: Date of Service: [name] 03/02/2020 1:15 PM Medical Record Number: [id number] Patient Account Number: [id number] Date of Birth/Sex: Treating RN: 1950/07/04 (69 y.o. Orvan Falconer Primary Care Wyatte Dames: Nicholes Stairs Other Clinician: Referring Ladarion Munyon: Treating Bensen Chadderdon/Extender: Nyra Jabs in Treatment: 0 Foot Assessment Items Site Locations + = Sensation present, - = Sensation absent, C = Callus, U = Ulcer R = Redness, W = Warmth, M = Maceration, PU = Pre-ulcerative lesion F = Fissure, S = Swelling, D = Dryness Assessment Right: Left: Other Deformity: No No Prior Foot Ulcer: No No Prior Amputation: No No Charcot Joint: No No Ambulatory Status: Ambulatory Without Help Gait: Steady Electronic Signature(s) Signed: 03/03/2020 5:00:24 PM By: Carlene Coria  RN Entered By: Carlene Coria on 03/02/2020 13:53:21 -------------------------------------------------------------------------------- Nutrition Risk Screening Details Patient Name: Date of Service: Paula Chard A. 03/02/2020 1:15 PM Medical Record Number: [id number] Patient Account Number: [id number] Date of Birth/Sex: Treating RN: 1951-03-09 (69 y.o. Orvan Falconer Primary Care Glenroy Crossen: Nicholes Stairs Other Clinician: Referring Kendallyn Lippold: Treating Arpan Eskelson/Extender: Nyra Jabs in Treatment: 0 Height (in): 65 Weight (lbs): 230 Body Mass Index (BMI): 38.3 Nutrition Risk Screening Items Score Screening NUTRITION RISK SCREEN: I have an illness or condition that made me change the kind and/or amount of food I eat 0 No I eat fewer than two meals per day 0 No I eat few fruits and vegetables, or milk products 0 No I have three or more drinks of beer, liquor or wine almost every day 0 No I have tooth or mouth problems that make it hard for me to eat 0 No I don't always have enough money to buy the food I need 0 No I eat alone most of the time 0 No I take three or more different prescribed or over-the-counter drugs a day 1 Yes Without wanting to, I have lost or gained 10 pounds in the last six months 0 No I am not always physically able to shop, cook and/or feed myself 2 Yes Nutrition Protocols Good Risk Protocol Moderate Risk Protocol 0 Provide education on nutrition High Risk Proctocol Risk Level: Moderate Risk Score: 3 Electronic Signature(s) Signed: 03/03/2020 5:00:24 PM By: Carlene Coria RN Entered By: Carlene Coria on 03/02/2020 13:50:42

## 2020-03-06 ENCOUNTER — Encounter: Payer: Self-pay | Admitting: Cardiovascular Disease

## 2020-03-06 NOTE — Progress Notes (Signed)
KAYLIANNA, DETERT ([id number]) Visit Report for 03/02/2020 Chief Complaint Document Details Patient Name: Date of Service: Georgie Chard A. 03/02/2020 1:15 PM Medical Record Number: [id number] Patient Account Number: [id number] Date of Birth/Sex: Treating RN: 1950/12/26 (69 y.o. Helene Shoe, Tammi Klippel Primary Care Provider: Nicholes Stairs Other Clinician: Referring Provider: Treating Provider/Extender: Nyra Jabs in Treatment: 0 Information Obtained from: Patient Chief Complaint 03/02/2020; patient is here for review of wounds on her bilateral lower extremities Electronic Signature(s) Signed: 03/02/2020 5:37:49 PM By: Linton Ham MD Entered By: Linton Ham on 03/02/2020 14:46:51 -------------------------------------------------------------------------------- HPI Details Patient Name: Date of Service: [name] 03/02/2020 1:15 PM Medical Record Number: [id number] Patient Account Number: [id number] Date of Birth/Sex: Treating RN: 22-Jul-1950 (70 y.o. Helene Shoe, Tammi Klippel Primary Care Provider: Nicholes Stairs Other Clinician: Referring Provider: Treating Provider/Extender: Nyra Jabs in Treatment: 0 History of Present Illness HPI Description: ADMISSION 03/02/2020 This is a 69 year old woman who is here for review of wounds on her bilateral lower extremities. She states these happen to her when she was hospitalized in late July early August. She was critically ill for a period of time suggestion of DKA with acute kidney failure and Metformin induced lactic acidosis. She had a CTA of the pelvis at the time showed chronic PAD but no evidence of acute ischemia. Patient states she came out of the hospital with really sizable wounds on her bilateral lower extremities. They have since gotten smaller but she cannot get them to close. She has wounds on her lower left Achilles area as well as the right anterior tibial area. She  has compression stockings although she has not been wearing them recently stating her primary doctor told her not to. Past medical history includes heart failure with preserved ejection fraction, hypertension, hyperlipidemia, COPD, anemia, stage IV chronic renal failure, hospitalization in July with diabetic ketoacidosis, type 2 diabetes. ABIs in our clinic were not obtainable. In August 2019 her ABI in the right was 0.42 on the left 0.50. TBI in the left was 0.31 not obtained on the right. Both sides pinpoint waveforms were monophasic this is quite a bit worse than what was recorded in her last ABI on 04/15/2016 at which time her ABI in the right was 0.99 and a lift left 0.80. Dr. Carlis Abbott saw her as an outpatient in March and recommended angiography because of complaints of claudication with minimal activity. She was also seen in the hospital in July at which time she was on pressors to support her blood pressure which probably aggravated her lower extremity ischemia. I think she was felt to be back to her baseline after the pressors were stopped. She had blisters but no wounds on her feet. Currently the patient can walk I think she does have minimal distance claudication and perhaps even claudication at night although it is difficult to be certain about this given her history Electronic Signature(s) Signed: 03/02/2020 5:37:49 PM By: Linton Ham MD Entered By: Linton Ham on 03/02/2020 14:50:45 -------------------------------------------------------------------------------- Physical Exam Details Patient Name: Date of Service: MO Jenetta Downer RE, [name] 03/02/2020 1:15 PM Medical Record Number: [id number] Patient Account Number: [id number] Date of Birth/Sex: Treating RN: 08-02-1950 (69 y.o. Debby Bud Primary Care Provider: Nicholes Stairs Other Clinician: Referring Provider: Treating Provider/Extender: Nyra Jabs in Treatment: 0 Constitutional Patient is  hypertensive.. Pulse regular and within target range for patient.Marland Kitchen Respirations regular, non-labored and within  target range.. Temperature is normal and within the target range for the patient.Marland Kitchen Appears in no distress. Cardiovascular Femoral pulses are palpable bilaterally I cannot feel her popliteal pulses. Pedal pulses absent bilaterally.. Significant nonpitting edema in both lower legs, stasis dermatitis and very dry skin. Notes Wound exam; she has small clean areas over the left posterior Achilles area as well as around the anterior mid tibia. These are superficial with no depth. She states both wounds were larger in the hospital in July but I cannot see any description of them. She has nonpitting edema compatible with chronic venous insufficiency with lymphedema. Chronic stasis dermatitis and exceptionally dry skin in her lower extremities Electronic Signature(s) Signed: 03/02/2020 5:37:49 PM By: Linton Ham MD Entered By: Linton Ham on 03/02/2020 14:52:28 -------------------------------------------------------------------------------- Physician Orders Details Patient Name: Date of Service: MO Jenetta Downer RE, [name] 03/02/2020 1:15 PM Medical Record Number: [id number] Patient Account Number: [id number] Date of Birth/Sex: Treating RN: [birth year] (69 y.o. Debby Bud Primary Care Provider: Nicholes Stairs Other Clinician: Referring Provider: Treating Provider/Extender: Nyra Jabs in Treatment: 0 Verbal / Phone Orders: No Diagnosis Coding Follow-up Appointments Return Appointment in 1 week. Dressing Change Frequency Do not change entire dressing for one week. Skin Barriers/Peri-Wound Care TCA Cream or Ointment - mixed with lotion both legs. Wound Cleansing May shower with protection. - use a cast protector. Primary Wound Dressing Wound #1 Left Calcaneus Silver Collagen - moisten with hydrogel. Wound #2 Right Lower Leg Silver Collagen -  moisten with hydrogel. Secondary Dressing Wound #1 Left Calcaneus Heel Cup Wound #2 Right Lower Leg Dry Gauze Edema Control Kerlix and Coban - Bilateral - ***apply lightly*** Avoid standing for long periods of time Elevate legs to the level of the heart or above for 30 minutes daily and/or when sitting, a frequency of: - throughout the day. Exercise regularly Additional Orders / Instructions Other: - patient to decide to follow back up or not with Dr. Carlis Abbott concerning angiogram. Patient Medications llergies: penicillin A Notifications Medication Indication Start End lidocaine DOSE topical 4 % gel - gel topical applied to wounds only for debridements by MD. Electronic Signature(s) Signed: 03/02/2020 5:37:49 PM By: Linton Ham MD Signed: 03/02/2020 6:08:00 PM By: Deon Pilling Entered By: Deon Pilling on 03/02/2020 14:35:32 Prescription 03/02/2020 -------------------------------------------------------------------------------- Joselyn Arrow MD Patient Name: Provider: 1951/01/05 [id number] Date of Birth: NPI#: [id number] Sex: DEA #: [phone] Phone #: License #: [place] Patient Address: [facility] [address] [place], [place] [address] [place], [place] [address] [phone] Allergies penicillin Medication Medication: Route: Strength: Form: lidocaine 4 % topical gel topical 4% gel Class: TOPICAL LOCAL ANESTHETICS Dose: Frequency / Time: Indication: gel topical applied to wounds only for debridements by MD. Number of Refills: Number of Units: 0 Generic Substitution: Start Date: End Date: One Time Use: Substitution Permitted No Note to Pharmacy: Hand Signature: Date(s): Electronic Signature(s) Signed: 03/02/2020 5:37:49 PM By: Linton Ham MD Signed: 03/02/2020 6:08:00 PM By: Deon Pilling Entered By: Deon Pilling on 03/02/2020  14:35:32 -------------------------------------------------------------------------------- Problem List Details Patient Name: Date of Service: [name] 03/02/2020 1:15 PM Medical Record Number: [id number] Patient Account Number: [id number] Date of Birth/Sex: Treating RN: [birth year] (69 y.o. 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 of 03/02/2020 No Yes skin L97.811 Non-pressure chronic ulcer of other part of right lower leg limited to breakdown 03/02/2020 No Yes of skin I87.323 Chronic venous hypertension (idiopathic) with inflammation of bilateral lower 03/02/2020 No Yes extremity E11.42 Type 2 diabetes mellitus with diabetic polyneuropathy 03/02/2020 No Yes Inactive Problems Resolved Problems Electronic Signature(s) Signed: 03/02/2020 5:37:49 PM By: Linton Ham MD Entered By: Linton Ham on 03/02/2020 14:42:59 -------------------------------------------------------------------------------- Progress Note Details Patient Name: Date of Service: [name] 03/02/2020 1:15 PM Medical Record Number: [id number] Patient Account Number: [id number] Date of Birth/Sex: Treating RN: Nov 19, 1950 (69 y.o. Helene Shoe, Tammi Klippel Primary Care Provider: Nicholes Stairs Other Clinician: Referring Provider: Treating Provider/Extender: Nyra Jabs in Treatment: 0 Subjective Chief Complaint Information obtained from Patient 03/02/2020; patient is here for review of wounds on her bilateral lower extremities History of Present Illness (HPI) ADMISSION 03/02/2020 This  is a 69 year old woman who is here for review of wounds on her bilateral lower extremities. She states these happen to her when she was hospitalized in late July early August. She was critically ill for a period of time suggestion of DKA with acute kidney failure and Metformin induced lactic acidosis. She had a CTA of the pelvis at the time showed chronic PAD but no evidence of acute ischemia. Patient states she came out of the hospital with really sizable wounds on her bilateral lower extremities. They have since gotten smaller but she cannot get them to close. She has wounds on her lower left Achilles area as well as the right anterior tibial area. She has compression stockings although she has not been wearing them recently stating her primary doctor told her not to. Past medical history includes heart failure with preserved ejection fraction, hypertension, hyperlipidemia, COPD, anemia, stage IV chronic renal failure, hospitalization in July with diabetic ketoacidosis, type 2 diabetes. ABIs in our clinic were not obtainable. In August 2019 her ABI in the right was 0.42 on the left 0.50. TBI in the left was 0.31 not obtained on the right. Both sides pinpoint waveforms were monophasic this is quite a bit worse than what was recorded in her last ABI on 04/15/2016 at which time her ABI in the right was 0.99 and a lift left 0.80. Dr. Carlis Abbott saw her as an outpatient in March and recommended angiography because of complaints of claudication with minimal activity. She was also seen in the hospital in July at which time she was on pressors to support her blood pressure which probably aggravated her lower extremity ischemia. I think she was felt to be back to her baseline after the pressors were stopped. She had blisters but no wounds on her feet. Currently the patient can walk I think she does have minimal distance claudication and perhaps even claudication at night although it is difficult to be certain about  this given her history Patient History Information obtained from Patient. Allergies penicillin Family History Cancer - Mother,Siblings, Diabetes - Father, Heart Disease - Father, Hypertension - Mother,Father, No family history of Hereditary Spherocytosis, Kidney Disease, Lung Disease, Seizures, Stroke, Thyroid Problems, Tuberculosis. Social History Former smoker, Marital Status - Single, Alcohol Use - Never, Drug Use - No History, Caffeine Use - Daily. Medical History Eyes Denies history of Cataracts, Glaucoma, Optic Neuritis Ear/Nose/Mouth/Throat Denies history of Chronic sinus problems/congestion, Middle  ear problems Hematologic/Lymphatic Denies history of Anemia, Hemophilia, Human Immunodeficiency Virus, Lymphedema, Sickle Cell Disease Respiratory Patient has history of Chronic Obstructive Pulmonary Disease (COPD) Denies history of Aspiration, Asthma, Pneumothorax, Sleep Apnea, Tuberculosis Cardiovascular Patient has history of Congestive Heart Failure, Hypertension Denies history of Angina, Arrhythmia, Coronary Artery Disease, Deep Vein Thrombosis, Hypotension, Myocardial Infarction Gastrointestinal Denies history of Cirrhosis , Colitis, Crohnoos, Hepatitis A, Hepatitis B, Hepatitis C Endocrine Patient has history of Type II Diabetes Denies history of Type I Diabetes Integumentary (Skin) Denies history of History of Burn Musculoskeletal Denies history of Gout, Rheumatoid Arthritis, Osteoarthritis, Osteomyelitis Neurologic Denies history of Dementia, Neuropathy, Quadriplegia, Paraplegia, Seizure Disorder Oncologic Denies history of Received Chemotherapy, Received Radiation Psychiatric Denies history of Anorexia/bulimia, Confinement Anxiety Patient is treated with Insulin. Blood sugar is not tested. Review of Systems (ROS) Constitutional Symptoms (General Health) Denies complaints or symptoms of Fatigue, Fever, Chills, Marked Weight Change. Eyes Complains or has symptoms  of Glasses / Contacts. Denies complaints or symptoms of Dry Eyes, Vision Changes. Ear/Nose/Mouth/Throat Denies complaints or symptoms of Chronic sinus problems or rhinitis. Respiratory Denies complaints or symptoms of Chronic or frequent coughs, Shortness of Breath. Cardiovascular Denies complaints or symptoms of Chest pain. Gastrointestinal Denies complaints or symptoms of Frequent diarrhea, Nausea, Vomiting. Endocrine Denies complaints or symptoms of Heat/cold intolerance. Genitourinary Denies complaints or symptoms of Frequent urination. Integumentary (Skin) Complains or has symptoms of Wounds. Musculoskeletal Denies complaints or symptoms of Muscle Pain, Muscle Weakness. Neurologic Denies complaints or symptoms of Numbness/parasthesias. Psychiatric Denies complaints or symptoms of Claustrophobia, Suicidal. Objective Constitutional Patient is hypertensive.. Pulse regular and within target range for patient.Marland Kitchen Respirations regular, non-labored and within target range.. Temperature is normal and within the target range for the patient.Marland Kitchen Appears in no distress. Vitals Time Taken: 1:43 PM, Height: 65 in, Source: Stated, Weight: 230 lbs, Source: Stated, BMI: 38.3, Temperature: 98.5 F, Pulse: 65 bpm, Respiratory Rate: 18 breaths/min, Blood Pressure: 165/73 mmHg. Cardiovascular Femoral pulses are palpable bilaterally I cannot feel her popliteal pulses. Pedal pulses absent bilaterally.. Significant nonpitting edema in both lower legs, stasis dermatitis and very dry skin. General Notes: Wound exam; she has small clean areas over the left posterior Achilles area as well as around the anterior mid tibia. These are superficial with no depth. She states both wounds were larger in the hospital in July but I cannot see any description of them. She has nonpitting edema compatible with chronic venous insufficiency with lymphedema. Chronic stasis dermatitis and exceptionally dry skin in her lower  extremities Integumentary (Hair, Skin) Wound #1 status is Open. Original cause of wound was Gradually Appeared. The wound is located on the Left Calcaneus. The wound measures 0.6cm length x 1.2cm width x 0.1cm depth; 0.565cm^2 area and 0.057cm^3 volume. There is Fat Layer (Subcutaneous Tissue) exposed. There is no tunneling or undermining noted. There is a medium amount of serosanguineous drainage noted. There is large (67-100%) red granulation within the wound bed. There is no necrotic tissue within the wound bed. Wound #2 status is Open. Original cause of wound was Gradually Appeared. The wound is located on the Right Lower Leg. The wound measures 0.3cm length x 0.9cm width x 0.1cm depth; 0.212cm^2 area and 0.021cm^3 volume. There is Fat Layer (Subcutaneous Tissue) exposed. There is no tunneling or undermining noted. There is a medium amount of serosanguineous drainage noted. There is medium (34-66%) pink, pale granulation within the wound bed. There is a medium (34-66%) amount of necrotic tissue within the wound bed including Adherent Slough.  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 Dressing Change Frequency: Do not change entire dressing for one week. Skin Barriers/Peri-Wound Care: TCA Cream or Ointment - mixed with lotion both legs. Wound Cleansing: May shower with protection. - use a cast protector. Primary Wound Dressing: Wound #1 Left Calcaneus: Silver Collagen - moisten with hydrogel. Wound #2 Right Lower Leg: Silver Collagen - moisten with  hydrogel. Secondary Dressing: Wound #1 Left Calcaneus: Heel Cup Wound #2 Right Lower Leg: Dry Gauze Edema Control: Kerlix and Coban - Bilateral - ***apply lightly*** Avoid standing for long periods of time Elevate legs to the level of the heart or above for 30 minutes daily and/or when sitting, a frequency of: - throughout the day. Exercise regularly Additional Orders / Instructions: Other: - patient to decide to follow back up or not with Dr. Carlis Abbott concerning angiogram. The following medication(s) was prescribed: lidocaine topical 4 % gel gel topical applied to wounds only for debridements by MD. was prescribed at facility 1. We use silver collagen to the actual 2 wound areas. TCA and lubricating skin cream to her bilateral lower legs, gauze and kerlix Coban 2. We will not be able to increase her compression 3. She has claudication there is no question and its been recommended to her that she get an angiogram although she was scared about the lack of a guarantee of success. She was also seen by vein and vascular during the hospitalization in late July early August at which time she was felt to be improving off pressors. Dr. Carlis Abbott felt she might require an angiogram in the future. 4. If we can get these wounds to heal with conservative measures I do not think it is necessary to send her to vein and vascular. Although she has claudication she does not really seem all that concerned about it I do not know that I can convince her to go through an angiogram in any case unless she has evidence of critical limb ischemia 5. Ultimately she is going to need to wear compression stockings she says she has them. Certainly will need to lotion her skin I spent 45 minutes in review of this patient's past medical history, face-to-face evaluation and preparation of this record Electronic Signature(s) Signed: 03/02/2020 5:37:49 PM By: Linton Ham MD Entered By: Linton Ham on 03/02/2020  14:55:38 -------------------------------------------------------------------------------- HxROS Details Patient Name: Date of Service: [name] 03/02/2020 1:15 PM Medical Record Number: [id number] Patient Account Number: [id number] Date of Birth/Sex: Treating RN: [birth year] (70 y.o. Orvan Falconer Primary Care Provider: Nicholes Stairs Other Clinician: Referring Provider: Treating Provider/Extender: Nyra Jabs in Treatment: 0 Information Obtained From Patient Constitutional Symptoms (General Health) Complaints and Symptoms: Negative for: Fatigue; Fever; Chills; Marked Weight Change Eyes Complaints and Symptoms: Positive for: Glasses / Contacts Negative for: Dry Eyes; Vision Changes Medical History: Negative for: Cataracts; Glaucoma; Optic Neuritis Ear/Nose/Mouth/Throat Complaints and Symptoms: Negative for: Chronic sinus problems or rhinitis Medical History: Negative for: Chronic sinus problems/congestion; Middle ear problems Respiratory Complaints and Symptoms: Negative for: Chronic or frequent coughs; Shortness of Breath Medical History: Positive for: Chronic Obstructive Pulmonary Disease (COPD)  Negative for: Aspiration; Asthma; Pneumothorax; Sleep Apnea; Tuberculosis Cardiovascular Complaints and Symptoms: Negative for: Chest pain Medical History: Positive for: Congestive Heart Failure; Hypertension Negative for: Angina; Arrhythmia; Coronary Artery Disease; Deep Vein Thrombosis; Hypotension; Myocardial Infarction Gastrointestinal Complaints and Symptoms: Negative for: Frequent diarrhea; Nausea; Vomiting Medical History: Negative for: Cirrhosis ; Colitis; Crohns; Hepatitis A; Hepatitis B; Hepatitis C Endocrine Complaints and Symptoms: Negative for: Heat/cold intolerance Medical History: Positive for: Type II Diabetes Negative for: Type I Diabetes Time with diabetes: 5 Treated with: Insulin Blood sugar tested every day:  No Genitourinary Complaints and Symptoms: Negative for: Frequent urination Integumentary (Skin) Complaints and Symptoms: Positive for: Wounds Medical History: Negative for: History of Burn Musculoskeletal Complaints and Symptoms: Negative for: Muscle Pain; Muscle Weakness Medical History: Negative for: Gout; Rheumatoid Arthritis; Osteoarthritis; Osteomyelitis Neurologic Complaints and Symptoms: Negative for: Numbness/parasthesias Medical History: Negative for: Dementia; Neuropathy; Quadriplegia; Paraplegia; Seizure Disorder Psychiatric Complaints and Symptoms: Negative for: Claustrophobia; Suicidal Medical History: Negative for: Anorexia/bulimia; Confinement Anxiety Hematologic/Lymphatic Medical History: Negative for: Anemia; Hemophilia; Human Immunodeficiency Virus; Lymphedema; Sickle Cell Disease Immunological Oncologic Medical History: Negative for: Received Chemotherapy; Received Radiation Immunizations Pneumococcal Vaccine: Received Pneumococcal Vaccination: No Implantable Devices None Family and Social History Cancer: Yes - Mother,Siblings; Diabetes: Yes - Father; Heart Disease: Yes - Father; Hereditary Spherocytosis: No; Hypertension: Yes - Mother,Father; Kidney Disease: No; Lung Disease: No; Seizures: No; Stroke: No; Thyroid Problems: No; Tuberculosis: No; Former smoker; Marital Status - Single; Alcohol Use: Never; Drug Use: No History; Caffeine Use: Daily; Financial Concerns: No; Food, Clothing or Shelter Needs: No; Support System Lacking: No; Transportation Concerns: No Electronic Signature(s) Signed: 03/02/2020 5:37:49 PM By: Linton Ham MD Signed: 03/03/2020 5:00:24 PM By: Carlene Coria RN Entered By: Carlene Coria on 03/02/2020 13:48:43 -------------------------------------------------------------------------------- SuperBill Details Patient Name: Date of Service: MO Jenetta Downer RE, [name] 03/02/2020 Medical Record Number: [id number] Patient Account Number:  [id number] Date of Birth/Sex: Treating RN: 28-Mar-1951 (69 y.o. Helene Shoe, Tammi Klippel Primary Care Provider: Nicholes Stairs Other Clinician: Referring Provider: Treating Provider/Extender: Nyra Jabs in Treatment: 0 Diagnosis Coding ICD-10 Codes Code Description E11.51 Type 2 diabetes mellitus with diabetic peripheral angiopathy without gangrene L97.421 Non-pressure chronic ulcer of left heel and midfoot limited to breakdown of skin L97.811 Non-pressure chronic ulcer of other part of right lower leg limited to breakdown of skin I87.323 Chronic venous hypertension (idiopathic) with inflammation of bilateral lower extremity E11.42 Type 2 diabetes mellitus with diabetic polyneuropathy Facility Procedures CPT4 Code: [id number] Description: [phone] - WOUND CARE VISIT-LEV 5 EST PT Modifier: Quantity: 1 Physician Procedures : CPT4 Code Description Modifier [id number] - WC PHYS LEVEL 4 - NEW PT ICD-10 Diagnosis Description E11.51 Type 2 diabetes mellitus with diabetic peripheral angiopathy without gangrene L97.421 Non-pressure chronic ulcer of left heel and midfoot  limited to breakdown of skin L97.811 Non-pressure chronic ulcer of other part of right lower leg limited to breakdown of skin I87.323 Chronic venous 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

## 2020-03-06 NOTE — Progress Notes (Signed)
Cardiology Office Note   Date:  03/07/2020   ID:  Paula Wheeler, DOB 1950/10/07, MRN [id number]  PCP:  Hulan Fess, MD  Cardiologist:   Mertie Moores, MD   Chief Complaint  Patient presents with  . Atrial Fibrillation  . Hypertension   Problem list 1. Essential HTN  2. Hyperlipidemia 3. Cardiomegaly on x-ray 4. Diabetes mellitus 5. Obesity  6. Atrial fib -CHADS2VASC score is 20 ( female, age 69, HTN, PVD)   Nov. 13, 2017: Paula Wheeler is a 70 y.o. female who presents for evaluation of cardiomegaly She requested a CXR to look for lung issues ( is a current smoker) was found have cardiac megaly.  Her main complaint is that her legs give out when she is walking. She does not get any regular exercise.  No CP.   Some shortness of breath with exertion  Has DM,   She is getting better control of her glucose levels recently .    She admits that she eats more salt that she probably should. She's canned soup was on occasions. She put salt on her apples.  Feb. 12, 2018:  Pt was seen by Dr. Fletcher Anon for PVD. Was found to have atrial fib.   CHADS2VASC score is 51 ( female, age 35, HTN, PVD)  Started on Eliquis. Developed a GI bleed.  EGD shows erosive gastropathy.   She was told to stop her Goodies powders and Indomethicin ( which she was taking for gout )  She has seen Dr. Oletta Lamas -  She decided to reduce her Eliquis to 5 mg a day .    July 25, 2016:  Pt is seen back for follow Up of her atrial fibrillation. She was started on L course but she developed a GI bleed. She had been taking lots of goodies powders and indomethacin for gout.  July 25, 2016:  No symptoms related to atrial fib. Still smoking.  Her hands are cold at night  BP is up today .  Her Quinipril was reduced to once a day when she was in the hospital in jan. 2018.   October 28, 2016:  Was cardioverted on April 11 Unfortunately, is back in atrial fib She cannot tell that her HR is irreg.  Remains  on Eliquis   Echo from 12. /17 showed normal Lv systolic function   Still smoking   04-01-2017:  Mother died recently . No CP or dyspnea  Hx of atria fib   Sep 17, 2017:  Paula Wheeler is seen for follow up of atrial fib  Is having difficulty breathing and her legs are swollen  Dyspnea for the past 3 weeks.  Not associated with any CP Has cut back on her smoking  Wt today is 226.  ( up 15 lbs since Dec. 2018)  Has lack of energy . Seems to feel better when she is on prednisone   She is in persistent atrial fibrillation.  We cardioverted her about a year ago but it only lasted for several weeks.  We discussed going to the A. fib clinic for consideration of Tikosyn therapy.  January 05, 2018: Seen today for follow-up of her atrial fibrillation, hypertension hyperlipidemia. Was started on Pletal - has not started it until she cleared it with me  Still smoking  Previously, we had her on losartan but it affected her renal function.  We also had her on some amlodipine but this caused significant leg edema.  She has stopped both  of these medications and her blood pressure remains well controlled.  March 04, 2018: Paula Wheeler is seen today for follow-up of her atrial fibrillation.  She also has hypertension and hyperlipidemia.  She has been anemic. Hemoglobin was 5.8.  She received 3 units packed red blood cells.  She has chronic kidney disease stage IV. She had not noticed any blood in her stool or black tarry stool   Is scheduled for EGD and colonoscopy Nov. 14,  Our    Jan. 12, 2021 Paula Wheeler is seen today for follow up of her atrial fib,  HTN, hyperlipidemia. Still smoking .   Is in Afib   March 07, 2020:  Is seen today for follow-up of her atrial fibrillation, hypertension, hyperlipidemia.  She has normal left ventricular systolic function by echocardiogram in July, 2021.  We were not able to determine diastolic function.  There were no significant valve  problems.   Was in the hospital in July with respiratory failure and metabolic acidosis, acute kidney injury ,   Has edema in her legs .  Has stopped smoking Has been to nephrology ( [facility])   Is taking torsemide 20 mg PO BID ( not daily as listed in the chart )    Past Medical History:  Diagnosis Date  . Anemia   . COPD (chronic obstructive pulmonary disease) ([place])   . Dyslipidemia   . Exogenous obesity   . GERD (gastroesophageal reflux disease)   . GI bleed 05/14/2016  . History of abnormal cells from cervix   . Hyperlipidemia   . Hypertension   . Iron deficiency anemia   . Longstanding persistent atrial fibrillation ([place])   . Microalbuminuria   . Tobacco dependence   . Type II diabetes mellitus ([place])     Past Surgical History:  Procedure Laterality Date  . CARDIOVERSION N/A 08/07/2016   Procedure: CARDIOVERSION;  Surgeon: Thayer Headings, MD;  Location: [facility];  Service: Cardiovascular;  Laterality: N/A;  . DIAGNOSTIC MAMMOGRAM  05/2015  . ESOPHAGOGASTRODUODENOSCOPY N/A 05/15/2016   Procedure: ESOPHAGOGASTRODUODENOSCOPY (EGD);  Surgeon: Wonda Horner, MD;  Location: [facility] ENDOSCOPY;  Service: Endoscopy;  Laterality: N/A;  . [place]   "I think they left my appendix"  . PAP SMEAR  2012 AND 2017     Current Outpatient Medications  Medication Sig Dispense Refill  . albuterol (PROVENTIL HFA;VENTOLIN HFA) 108 (90 Base) MCG/ACT inhaler Inhale 2 puffs into the lungs every 4 (four) hours as needed for wheezing or shortness of breath.    . allopurinol (ZYLOPRIM) 100 MG tablet Take 100 mg by mouth daily.    Marland Kitchen apixaban (ELIQUIS) 5 MG TABS tablet Take 1 tablet (5 mg total) by mouth 2 (two) times daily. 180 tablet 3  . atorvastatin (LIPITOR) 40 MG tablet Take 1 tablet (40 mg total) by mouth at bedtime. 90 tablet 3  . bisoprolol (ZEBETA) 5 MG tablet Take 1 tablet (5 mg total) by mouth daily. 90 tablet 1  . clotrimazole (GYNE-LOTRIMIN 3) 2 % vaginal cream  Place 1 Applicatorful vaginally at bedtime. 21 g 0  . Cyanocobalamin (VITAMIN B-12 IJ) Inject 1 mL as directed every 30 (thirty) days.     Marland Kitchen escitalopram (LEXAPRO) 10 MG tablet Take 10 mg by mouth as needed.    . ferrous sulfate 325 (65 FE) MG tablet Take 1 tablet (325 mg total) by mouth daily with breakfast. 30 tablet 3  . folic acid (FOLVITE) 1 MG tablet Take 1 tablet (1 mg total)  by mouth daily. 30 tablet 0  . guaiFENesin (MUCINEX) 600 MG 12 hr tablet Take 1 tablet (600 mg total) by mouth 2 (two) times daily. 30 tablet 0  . HYDROcodone-acetaminophen (NORCO/VICODIN) 5-325 MG tablet Take 1 tablet by mouth every 6 (six) hours as needed for moderate pain.    Marland Kitchen insulin NPH-regular Human (70-30) 100 UNIT/ML injection Inject 6 Units into the skin 2 (two) times daily with a meal. Inject 30 units into the skin in the morning and 70 units into the skin in the evening 10 mL 11  . Insulin Syringe-Needle U-100 (B-D INS SYR ULTRAFINE 1CC/31G) 31G X 5/16" 1 ML MISC by Does not apply route.    . loratadine (CLARITIN) 10 MG tablet Take 10 mg by mouth daily.    . pantoprazole (PROTONIX) 40 MG tablet Take 40 mg by mouth daily.    Marland Kitchen SPIRIVA HANDIHALER 18 MCG inhalation capsule Place 18 mcg into inhaler and inhale daily.     Marland Kitchen torsemide (DEMADEX) 20 MG tablet Take 40 mg (2 tabs) in the morning and 20 mg (1 tab) in the afternoon 270 tablet 3   No current facility-administered medications for this visit.    Allergies:   Nsaids and Penicillins    Social History:  The patient  reports that she has been smoking cigarettes. She has a 20.00 pack-year smoking history. She has never used smokeless tobacco. She reports current alcohol use. She reports that she does not use drugs.   Family History:  The patient's family history includes Cancer in her mother; Colon polyps in her brother, brother, brother, brother, brother, and brother; Diabetes in her father; Heart attack in her father; Hypertension in her mother; Macular  degeneration in her mother.    ROS:  Please see the history of present illness.    Physical Exam: Blood pressure (!) 142/68, pulse 90, height 5\' 4"  (1.626 m), weight 236 lb 6.4 oz (107.2 kg), SpO2 96 %.  GEN:   Middle age female,  Mild respiratory distress.  HEENT: Normal NECK: No JVD; No carotid bruits LYMPHATICS: No lymphadenopathy CARDIAC: RRR , soft systolic murmur  RESPIRATORY:  Clear to auscultation without rales, wheezing or rhonchi  ABDOMEN: Soft, non-tender, non-distended MUSCULOSKELETAL:  2+ pitting edema , just past the knees.   SKIN: Warm and dry NEUROLOGIC:  Alert and oriented x 3   EKG:      Recent Labs: 11/23/2019: ALT 7 11/29/2019: BUN 28; Creatinine, Ser 2.60; Hemoglobin 8.8; Magnesium 1.8; Platelets 138; Potassium 4.2; Sodium 136    Lipid Panel    Component Value Date/Time   CHOL 111 05/11/2019 0837   TRIG 232 (H) 05/11/2019 0837   HDL 29 (L) 05/11/2019 0837   CHOLHDL 3.8 05/11/2019 0837   CHOLHDL 4.1 01/30/2018 0843   VLDL 34 01/30/2018 0843   LDLCALC 45 05/11/2019 0837      Wt Readings from Last 3 Encounters:  03/07/20 236 lb 6.4 oz (107.2 kg)  11/26/19 (!) 215 lb 2.7 oz (97.6 kg)  07/06/19 204 lb (92.5 kg)      Other studies Reviewed: Additional studies/ records that were reviewed today include: . Review of the above records demonstrates:    ASSESSMENT AND PLAN:  1.   Atrial fib :  Remains in NSR   2.   acute on chronic diastolic congestive heart failure:   Likely has diastolic CHF Torsemide 20 mg is not producing much diuresis  Will increase Torsemide to 40 mg in AM with 20 mg  in PM Will have Dr. Carmina Miller check labs in 1 week ( already scheduled)    2. Essential hypertension:   BP is well controlled.   3. Claudication:       4. Hyperlipidemia:     Cont current meds.     5.  Chronic kidney disease:    - [place] acute renal failure with Creatinine up to 10.  Current Creatinine is 2.6  Current medicines are reviewed at length with  the patient today.  The patient does not have concerns regarding medicines.  Labs/ tests ordered today include:   Orders Placed This Encounter  Procedures  . EKG 12-Lead     Mertie Moores, MD  03/07/2020 4:42 PM    [facility] [address], [place], [place]  [address] Phone: [phone]; Fax: [phone]

## 2020-03-07 ENCOUNTER — Ambulatory Visit (INDEPENDENT_AMBULATORY_CARE_PROVIDER_SITE_OTHER): Payer: BC Managed Care – PPO | Admitting: Cardiovascular Disease

## 2020-03-07 ENCOUNTER — Encounter: Payer: Self-pay | Admitting: Cardiovascular Disease

## 2020-03-07 ENCOUNTER — Other Ambulatory Visit: Payer: Self-pay

## 2020-03-07 VITALS — BP 142/68 | HR 90 | Ht 64.0 in | Wt 236.4 lb

## 2020-03-07 DIAGNOSIS — I482 Chronic atrial fibrillation, unspecified: Secondary | ICD-10-CM

## 2020-03-07 DIAGNOSIS — E782 Mixed hyperlipidemia: Secondary | ICD-10-CM

## 2020-03-07 DIAGNOSIS — I5032 Chronic diastolic (congestive) heart failure: Secondary | ICD-10-CM

## 2020-03-07 DIAGNOSIS — I739 Peripheral vascular disease, unspecified: Secondary | ICD-10-CM

## 2020-03-07 DIAGNOSIS — I10 Essential (primary) hypertension: Secondary | ICD-10-CM | POA: Diagnosis not present

## 2020-03-07 MED ORDER — TORSEMIDE 20 MG PO TABS
ORAL_TABLET | ORAL | 3 refills | Status: DC
Start: 2020-03-07 — End: 2020-07-25

## 2020-03-07 MED ORDER — APIXABAN 5 MG PO TABS: 5.0000 mg | ORAL_TABLET | Freq: Two times a day (BID) | ORAL | 3 refills | Status: DC

## 2020-03-07 MED ORDER — ATORVASTATIN CALCIUM 40 MG PO TABS: 40.0000 mg | ORAL_TABLET | Freq: Every day | ORAL | 3 refills | Status: DC

## 2020-03-07 NOTE — Patient Instructions (Addendum)
Medication Instructions:  Your physician has recommended you make the following change in your medication:  INCREASE Torsemide to 40 mg every morning and 20 mg every afternoon  *If you need a refill on your cardiac medications before your next appointment, please call your pharmacy*   Lab Work: None Ordered If you have labs (blood work) drawn today and your tests are completely normal, you will receive your results only by: Marland Kitchen MyChart Message (if you have MyChart) OR . A paper copy in the mail If you have any lab test that is abnormal or we need to change your treatment, we will call you to review the results.   Testing/Procedures: None Ordered   Follow-Up: At [facility], you and your health needs are our priority.  As part of our continuing mission to provide you with exceptional heart care, we have created designated Provider Care Teams.  These Care Teams include your primary Cardiologist (physician) and Advanced Practice Providers (APPs -  Physician Assistants and Nurse Practitioners) who all work together to provide you with the care you need, when you need it.  Your next appointment:   3 month(s) on Monday Feb. 14 at 9:20 am  The format for your next appointment:   In Person  Provider:   Mertie Moores, MD   Other Instructions  For your  leg edema you  should do  the following 1. Leg elevation - I recommend the Lounge Dr. Leg rest.  See below for details  2. Salt restriction  -  Use potassium chloride instead of regular salt as a salt substitute. 3. Walk regularly 4. Compression hose - [facility] [employer] 5. Weight loss    Available on [email or web address] Or  Go to [email or web address]

## 2020-03-09 ENCOUNTER — Encounter (HOSPITAL_BASED_OUTPATIENT_CLINIC_OR_DEPARTMENT_OTHER): Payer: BC Managed Care – PPO | Admitting: Internal Medicine

## 2020-03-09 ENCOUNTER — Other Ambulatory Visit: Payer: Self-pay

## 2020-03-09 DIAGNOSIS — E1151 Type 2 diabetes mellitus with diabetic peripheral angiopathy without gangrene: Secondary | ICD-10-CM | POA: Diagnosis not present

## 2020-03-09 NOTE — Progress Notes (Signed)
Paula, Wheeler ([id number]) Visit Report for 03/09/2020 Debridement Details Patient Name: Date of Service: Paula Paula Wheeler A. 03/09/2020 9:00 A M Medical Record Number: [id number] Patient Account Number: [id number] Date of Birth/Sex: Treating RN: 08-13-1950 (70 y.o. Paula Wheeler, Paula Wheeler Primary Care Provider: Nicholes Stairs Other Clinician: Referring Provider: Treating Provider/Extender: Nyra Jabs in Treatment: 1 Debridement Performed for Assessment: Wound #1 Left Calcaneus Performed By: Physician Ricard Dillon., MD Debridement Type: Debridement Severity of Tissue Pre Debridement: Limited to breakdown of skin Level of Consciousness (Pre-procedure): Awake and Alert Pre-procedure Verification/Time Out Yes - 09:15 Taken: Start Time: 09:16 Pain Control: Lidocaine 4% T opical Solution T Area Debrided (L x W): otal 1 (cm) x 1 (cm) = 1 (cm) Tissue and other material debrided: Viable, Non-Viable, Skin: Dermis , Skin: Epidermis Level: Skin/Epidermis Debridement Description: Selective/Open Wound Instrument: Curette Bleeding: Minimum Hemostasis Achieved: Pressure End Time: 09:19 Procedural Pain: 0 Post Procedural Pain: 0 Response to Treatment: Procedure was tolerated well Level of Consciousness (Post- Awake and Alert procedure): Post Debridement Measurements of Total Wound Length: (cm) 1 Width: (cm) 1 Depth: (cm) 0.1 Volume: (cm) 0.079 Character of Wound/Ulcer Post Debridement: Improved Severity of Tissue Post Debridement: Limited to breakdown of skin Post Procedure Diagnosis Same as Pre-procedure Electronic Signature(s) Signed: 03/09/2020 5:00:15 PM By: Linton Ham MD Signed: 03/09/2020 5:09:44 PM By: Deon Pilling Entered By: Linton Ham on 03/09/2020 09:23:11 -------------------------------------------------------------------------------- HPI Details Patient Name: Date of Service: Paula Paula Wheeler, Paula Paula A. 03/09/2020 9:00 A  M Medical Record Number: [id number] Patient Account Number: [id number] Date of Birth/Sex: Treating RN: 04-Jul-1950 (69 y.o. Paula Wheeler, Paula Wheeler Primary Care Provider: Nicholes Stairs Other Clinician: Referring Provider: Treating Provider/Extender: Nyra Jabs in Treatment: 1 History of Present Illness HPI Description: ADMISSION 03/02/2020 This is a 69 year old woman who is here for review of wounds on her bilateral lower extremities. She states these happen to her when she was hospitalized in late July early August. She was critically ill for a period of time suggestion of DKA with acute kidney failure and Metformin induced lactic acidosis. She had a CTA of the pelvis at the time showed chronic PAD but no evidence of acute ischemia. Patient states she came out of the hospital with really sizable wounds on her bilateral lower extremities. They have since gotten smaller but she cannot get them to close. She has wounds on her lower left Achilles area as well as the right anterior tibial area. She has compression stockings although she has not been wearing them recently stating her primary doctor told her not to. Past medical history includes heart failure with preserved ejection fraction, hypertension, hyperlipidemia, COPD, anemia, stage IV chronic renal failure, hospitalization in July with diabetic ketoacidosis, type 2 diabetes. ABIs in our clinic were not obtainable. In August 2019 her ABI in the right was 0.42 on the left 0.50. TBI in the left was 0.31 not obtained on the right. Both sides pinpoint waveforms were monophasic this is quite a bit worse than what was recorded in her last ABI on 04/15/2016 at which time her ABI in the right was 0.99 and a lift left 0.80. Dr. Carlis Abbott saw her as an outpatient in March and recommended angiography because of complaints of claudication with minimal activity. She was also seen in the hospital in July at which time she was on pressors to  support her blood pressure which probably aggravated her lower extremity ischemia. I think she  was felt to be back to her baseline after the pressors were stopped. She had blisters but no wounds on her feet. Currently the patient can walk I think she does have minimal distance claudication and perhaps even claudication at night although it is difficult to be certain about this given her history 11/11; patient I admitted to the clinic last week. She has type 2 diabetes with PAD as well as chronic venous insufficiency. She came in with open wounds on her left Achilles heel and on the right medial lower leg. The right medial lower leg is healed today. Electronic Signature(s) Signed: 03/09/2020 5:00:15 PM By: Linton Ham MD Entered By: Linton Ham on 03/09/2020 39:76:73 -------------------------------------------------------------------------------- Physical Exam Details Patient Name: Date of Service: Paula Paula Wheeler, Paula Paula A. 03/09/2020 9:00 A M Medical Record Number: [id number] Patient Account Number: [id number] Date of Birth/Sex: Treating RN: 11-02-1950 (69 y.o. Paula Wheeler Primary Care Provider: Nicholes Stairs Other Clinician: Referring Provider: Treating Provider/Extender: Nyra Jabs in Treatment: 1 Constitutional Patient is hypertensive.. Pulse regular and within target range for patient.Marland Kitchen Respirations regular, non-labored and within target range.. Temperature is normal and within the target range for the patient.Marland Kitchen Appears in no distress. Notes Wound exam; she has a small clean area over the left posterior Achilles however there is eschar over the top of this which I removed the wound is not healed I remove this and some skin from the circumference. The base of the wound looks healthy. She had another area on the medial right tibia/calf. This is indeed healed Electronic Signature(s) Signed: 03/09/2020 5:00:15 PM By: Linton Ham MD Entered By:  Linton Ham on 03/09/2020 09:25:24 -------------------------------------------------------------------------------- Physician Orders Details Patient Name: Date of Service: Paula Paula Wheeler, Paula Paula A. 03/09/2020 9:00 A M Medical Record Number: [id number] Patient Account Number: [id number] Date of Birth/Sex: Treating RN: Apr 20, 1951 (69 y.o. 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 Nurse Visit: - Friday 03/17/2020 Dressing Change Frequency Do not change entire dressing for one week. Skin Barriers/Peri-Wound Care Moisturizing lotion - right leg nightly. TCA Cream or Ointment - mixed with lotion both legs. Wound Cleansing May shower with protection. - use a cast protector. Primary Wound Dressing Wound #1 Left Calcaneus Silver Collagen - moisten with hydrogel. Secondary Dressing Wound #1 Left Calcaneus Heel Cup Edema Control Kerlix and Coban - Left Lower Extremity - ***apply lightly*** apply first layer of unna boot to aid in securing the wrap. Avoid standing for long periods of time Elevate legs to the level of the heart or above for 30 minutes daily and/or when sitting, a frequency of: - throughout the day. Exercise regularly Support Garment 10-20 mm/Hg pressure to: - patient to wear stocking to right  leg. apply in the morning and remove at night. Additional Orders / Instructions Other: - patient to decide to follow back up or not with Dr. Carlis Abbott concerning angiogram. Electronic Signature(s) Signed: 03/09/2020 5:00:15 PM By:  Linton Ham MD Signed: 03/09/2020 5:09:44 PM By: Deon Pilling Entered By: Deon Pilling on 03/09/2020 09:23:34 -------------------------------------------------------------------------------- Problem List Details Patient Name: Date of Service: Paula Paula Wheeler, Paula Paula A. 03/09/2020 9:00 A M Medical Record Number: [id number] Patient Account Number: [id number] Date of Birth/Sex: Treating RN: 01-01-1951 (69 y.o. 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 limited to breakdown of 03/02/2020 No Yes skin L97.811 Non-pressure chronic ulcer of other part of right lower leg limited to breakdown 03/02/2020 No Yes of skin I87.323 Chronic venous hypertension (idiopathic) with inflammation of bilateral lower 03/02/2020 No Yes extremity E11.42 Type 2 diabetes mellitus with diabetic polyneuropathy 03/02/2020 No Yes Inactive Problems Resolved Problems Electronic Signature(s) Signed: 03/09/2020 5:00:15 PM By: Linton Ham MD Entered By: Linton Ham on 03/09/2020 09:22:45 -------------------------------------------------------------------------------- Progress Note Details Patient Name: Date of Service: Paula Paula Wheeler, Paula Paula A. 03/09/2020 9:00 A M Medical Record Number: [id number] Patient Account Number: [id number] Date of Birth/Sex: Treating RN: Oct 08, 1950 (69 y.o. Paula Wheeler, Paula Wheeler Primary Care Provider:  Nicholes Stairs Other Clinician: Referring Provider: Treating Provider/Extender: Nyra Jabs in Treatment: 1 Subjective History of Present Illness (HPI) ADMISSION 03/02/2020 This is a 69 year old woman who is here for review of wounds on her bilateral lower extremities. She states these happen to her when she was hospitalized in late July early August. She was critically ill for a period of time suggestion of DKA with acute kidney failure and Metformin induced lactic acidosis. She had a CTA of the pelvis at the time showed chronic PAD but no evidence of acute ischemia. Patient states she came out of the hospital with really sizable wounds on her bilateral lower extremities. They have since gotten smaller but she cannot get them to close. She has wounds on her lower left Achilles area as well as the right anterior tibial area. She has compression stockings although she has not been wearing them recently stating her primary doctor told her not to. Past medical history includes heart failure with preserved ejection fraction, hypertension, hyperlipidemia, COPD, anemia, stage IV chronic renal failure, hospitalization in July with diabetic ketoacidosis, type 2 diabetes. ABIs in our clinic were not obtainable. In August 2019 her ABI in the right was 0.42 on the left 0.50. TBI in the left was 0.31 not obtained on the right. Both sides pinpoint waveforms were monophasic this is quite a bit worse than what was recorded in her last ABI on 04/15/2016 at which time her ABI in the right was 0.99 and a lift left 0.80. Dr. Carlis Abbott saw her as an outpatient in March and recommended angiography because of complaints of claudication with minimal activity. She was also seen in the hospital in July at which time she was on pressors to support her blood pressure which probably aggravated her lower extremity ischemia. I think she was felt to be back to her baseline after the pressors were stopped.  She had blisters but no wounds on her feet. Currently the patient can walk I think she does have minimal distance claudication and perhaps even claudication at night although it is difficult to be certain about this given her history 11/11; patient I admitted to the clinic last week. She  has type 2 diabetes with PAD as well as chronic venous insufficiency. She came in with open wounds on her left Achilles heel and on the right medial lower leg. The right medial lower leg is healed today. Objective Constitutional Patient is hypertensive.. Pulse regular and within target range for patient.Marland Kitchen Respirations regular, non-labored and within target range.. Temperature is normal and within the target range for the patient.Marland Kitchen Appears in no distress. Vitals Time Taken: 8:49 AM, Height: 65 in, Weight: 230 lbs, BMI: 38.3, Temperature: 98.4 F, Pulse: 94 bpm, Respiratory Rate: 18 breaths/min, Blood Pressure: 149/79 mmHg, Capillary Blood Glucose: 151 mg/dl. General Notes: glucose per pt report General Notes: Wound exam; she has a small clean area over the left posterior Achilles however there is eschar over the top of this which I removed the wound is not healed I remove this and some skin from the circumference. The base of the wound looks healthy. ooShe had another area on the medial right tibia/calf. This is indeed healed Integumentary (Hair, Skin) Wound #1 status is Open. Original cause of wound was Gradually Appeared. The wound is located on the Left Calcaneus. The wound measures 1cm length x 1cm width x 0.1cm depth; 0.785cm^2 area and 0.079cm^3 volume. There is no tunneling or undermining noted. There is a none present amount of drainage noted. The wound margin is distinct with the outline attached to the wound base. There is no granulation within the wound bed. There is no necrotic tissue within the wound bed. Wound #2 status is Open. Original cause of wound was Gradually Appeared. The wound is located on  the Right Lower Leg. The wound measures 0cm length x 0cm width x 0cm depth; 0cm^2 area and 0cm^3 volume. There is no tunneling or undermining noted. There is a none present amount of drainage noted. There is no granulation within the wound bed. There is no necrotic tissue within the wound bed. Assessment Active Problems ICD-10 Type 2 diabetes mellitus with diabetic peripheral angiopathy without gangrene Non-pressure chronic ulcer of left heel and midfoot limited to breakdown of skin Non-pressure chronic ulcer of other part of right lower leg limited to breakdown of skin Chronic venous hypertension (idiopathic) with inflammation of bilateral lower extremity Type 2 diabetes mellitus with diabetic polyneuropathy Procedures Wound #1 Pre-procedure diagnosis of Wound #1 is a Diabetic Wound/Ulcer of the Lower Extremity located on the Left Calcaneus .Severity of Tissue Pre Debridement is: Limited to breakdown of skin. There was a Selective/Open Wound Skin/Epidermis Debridement with a total area of 1 sq cm performed by Ricard Dillon., MD. With the following instrument(s): Curette to remove Viable and Non-Viable tissue/material. Material removed includes Skin: Dermis and Skin: Epidermis and after achieving pain control using Lidocaine 4% Topical Solution. A time out was conducted at 09:15, prior to the start of the procedure. A Minimum amount of bleeding was controlled with Pressure. The procedure was tolerated well with a pain level of 0 throughout and a pain level of 0 following the procedure. Post Debridement Measurements: 1cm length x 1cm width x 0.1cm depth; 0.079cm^3 volume. Character of Wound/Ulcer Post Debridement is improved. Severity of Tissue Post Debridement is: Limited to breakdown of skin. Post procedure Diagnosis Wound #1: Same as Pre-Procedure Plan Follow-up Appointments: Return Appointment in 2 weeks. Nurse Visit: - Friday 03/17/2020 Dressing Change Frequency: Do not change  entire dressing for one week. Skin Barriers/Peri-Wound Care: Moisturizing lotion - right leg nightly. TCA Cream or Ointment - mixed with lotion both legs. Wound Cleansing: May shower  with protection. - use a cast protector. Primary Wound Dressing: Wound #1 Left Calcaneus: Silver Collagen - moisten with hydrogel. Secondary Dressing: Wound #1 Left Calcaneus: Heel Cup Edema Control: Kerlix and Coban - Left Lower Extremity - ***apply lightly*** apply first layer of unna boot to aid in securing the wrap. Avoid standing for long periods of time Elevate legs to the level of the heart or above for 30 minutes daily and/or when sitting, a frequency of: - throughout the day. Exercise regularly Support Garment 10-20 mm/Hg pressure to: - patient to wear stocking to right leg. apply in the morning and remove at night. Additional Orders / Instructions: Other: - patient to decide to follow back up or not with Dr. Carlis Abbott concerning angiogram. 1. Silver collagen to the left calcaneus kerlix Coban 2. The area on the right is healed I have asked her to use her compression stocking. 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 03/09/2020 Medical Record Number: [id number] Patient Account Number: [id number] Date of Birth/Sex: Treating RN: 08/14/1950 (69 y.o. 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 L97.811 Non-pressure chronic ulcer of other part of right lower leg limited to breakdown of skin I87.323 Chronic venous hypertension (idiopathic) with inflammation of bilateral lower extremity E11.42 Type 2 diabetes mellitus with diabetic polyneuropathy Facility Procedures CPT4 Code: [id number] Description: [phone] - DEBRIDE WOUND 1ST 20 SQ CM OR < ICD-10 Diagnosis Description L97.421 Non-pressure chronic ulcer of left heel and midfoot limited to breakdown of ski Modifier: n Quantity: 1 Physician Procedures : CPT4 Code Description Modifier [id number] - WC PHYS DEBR WO ANESTH 20 SQ CM ICD-10 Diagnosis Description 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

## 2020-03-14 NOTE — Progress Notes (Signed)
LOUELLEN, HALDEMAN ([id number]) Visit Report for 03/09/2020 Arrival Information Details Patient Name: Date of Service: Paula Wheeler A. 03/09/2020 9:00 A M Medical Record Number: [id number] Patient Account Number: [id number] Date of Birth/Sex: Treating RN: 31-Oct-1950 (69 y.o. [name], Briant Cedar Primary Care [place] Gipe: Nicholes Stairs Other Clinician: Referring Kayode Petion: Treating Taegan Standage/Extender: Nyra Jabs in Treatment: 1 Visit Information History Since Last Visit Added or deleted any medications: No Patient Arrived: Ambulatory Any new allergies or adverse reactions: No Arrival Time: 08:47 Had a fall or experienced change in No Accompanied By: alone activities of daily living that may affect Transfer Assistance: None risk of falls: Patient Identification Verified: Yes Signs or symptoms of abuse/neglect since last visito No Secondary Verification Process Completed: Yes Hospitalized since last visit: No Patient Requires Transmission-Based Precautions: No Implantable device outside of the clinic excluding No Patient Has Alerts: No cellular tissue based products placed in the center since last visit: Has Dressing in Place as Prescribed: Yes Has Compression in Place as Prescribed: Yes Pain Present Now: No Electronic Signature(s) Signed: 03/13/2020 5:51:53 PM By: Levan Hurst RN, BSN Entered By: Levan Hurst on 03/09/2020 08:48:08 -------------------------------------------------------------------------------- Encounter Discharge Information Details Patient Name: Date of Service: Paula Jenetta Downer RE, [name] 03/09/2020 9:00 A M Medical Record Number: [id number] Patient Account Number: [id number] Date of Birth/Sex: Treating RN: [birth year] (69 y.o. Elam Dutch Primary Care Jeovanni Heuring: Nicholes Stairs Other Clinician: Referring Aquilla Shambley: Treating Dayle Sherpa/Extender: Nyra Jabs in Treatment: 1 Encounter Discharge  Information Items Post Procedure Vitals Discharge Condition: Stable Temperature (F): 98.4 Ambulatory Status: Ambulatory Pulse (bpm): 94 Discharge Destination: Home Respiratory Rate (breaths/min): 18 Transportation: Private Auto Blood Pressure (mmHg): 149/79 Accompanied By: self Schedule Follow-up Appointment: Yes Clinical Summary of Care: Patient Declined Electronic Signature(s) Signed: 03/09/2020 5:21:57 PM By: Baruch Gouty RN, BSN Entered By: Baruch Gouty on 03/09/2020 09:47:20 -------------------------------------------------------------------------------- Lower Extremity Assessment Details Patient Name: Date of Service: Paula O RE, [name] 03/09/2020 9:00 A M Medical Record Number: [id number] Patient Account Number: [id number] Date of Birth/Sex: Treating RN: [birth year] (69 y.o. [name] Primary Care Jamiel Goncalves: Nicholes Stairs Other Clinician: Referring Chrishun Scheer: Treating Leeum Sankey/Extender: Nyra Jabs in Treatment: 1 Edema Assessment Assessed: Shirlyn Goltz: No] Patrice Paradise: No] Edema: [Left: Yes] [Right: Yes] Calf Left: Right: Point of Measurement: 40 cm From Medial Instep 41 cm 42 cm Ankle Left: Right: Point of Measurement: 10 cm From Medial Instep 21 cm 21.2 cm Vascular Assessment Pulses: Dorsalis Pedis Palpable: [Left:Yes] [Right:Yes] Electronic Signature(s) Signed: 03/13/2020 5:51:53 PM By: Levan Hurst RN, BSN Entered By: Levan Hurst on 03/09/2020 08:59:30 -------------------------------------------------------------------------------- Multi Wound Chart Details Patient Name: Date of Service: Paula Trixie Rude BETH A. 03/09/2020 9:00 A M Medical Record Number: [id number] Patient Account Number: [id number] Date of Birth/Sex: Treating RN: [birth year] (69 y.o. Helene Shoe, Meta.Reding Primary Care Thayer Embleton: Nicholes Stairs Other Clinician: Referring Ayub Kirsh: Treating Hallel Denherder/Extender: Nyra Jabs in  Treatment: 1 Vital Signs Height(in): 65 Capillary Blood Glucose(mg/dl): 151 Weight(lbs): 230 Pulse(bpm): 58 Body Mass Index(BMI): 73 Blood Pressure(mmHg): 149/79 Temperature(F): 98.4 Respiratory Rate(breaths/min): 18 Photos: [1:No Photos Left Calcaneus] [2:No Photos Right Lower Leg] [N/A:N/A N/A] Wound Location: [1:Gradually Appeared] [2:Gradually Appeared] [N/A:N/A] Wounding Event: [1:Diabetic Wound/Ulcer of the Lower] [2:Pressure Ulcer] [N/A:N/A] Primary Etiology: [1:Extremity Chronic Obstructive Pulmonary] [2:Chronic Obstructive Pulmonary] [N/A:N/A] Comorbid History: [1:Disease (COPD), Congestive Heart Failure, Hypertension, Type II Diabetes 11/17/2019] [2:Disease (COPD), Congestive Heart Failure, Hypertension, Type II Diabetes  11/10/2019] [N/A:N/A] Date Acquired: [1:1] [2:1] [N/A:N/A] Weeks of Treatment: [1:Open] [2:Open] [N/A:N/A] Wound Status: [1:1x1x0.1] [2:0x0x0] [N/A:N/A] Measurements L x W x D (cm) [1:0.785] [2:0] [N/A:N/A] A (cm) : rea [1:0.079] [2:0] [N/A:N/A] Volume (cm) : [1:-38.90%] [2:100.00%] [N/A:N/A] % Reduction in A rea: [1:-38.60%] [2:100.00%] [N/A:N/A] % Reduction in Volume: [1:Grade 2] [2:Category/Stage II] [N/A:N/A] Classification: [1:None Present] [2:None Present] [N/A:N/A] Exudate A mount: [1:Distinct, outline attached] [2:N/A] [N/A:N/A] Wound Margin: [1:None Present (0%)] [2:None Present (0%)] [N/A:N/A] Granulation A mount: [1:None Present (0%)] [2:None Present (0%)] [N/A:N/A] Necrotic A mount: [1:Fascia: No] [2:Fascia: No] [N/A:N/A] Exposed Structures: [1:Fat Layer (Subcutaneous Tissue): No Tendon: No Muscle: No Joint: No Bone: No Large (67-100%)] [2:Fat Layer (Subcutaneous Tissue): No Tendon: No Muscle: No Joint: No Bone: No Large (67-100%)] [N/A:N/A] Epithelialization: [1:Debridement - Selective/Open Wound] [2:N/A] [N/A:N/A] Debridement: Pre-procedure Verification/Time Out 09:15 [2:N/A] [N/A:N/A] Taken: [1:Lidocaine 4% Topical Solution] [2:N/A]  [N/A:N/A] Pain Control: [1:Skin/Epidermis] [2:N/A] [N/A:N/A] Level: [1:1] [2:N/A] [N/A:N/A] Debridement A (sq cm): [1:rea Curette] [2:N/A] [N/A:N/A] Instrument: [1:Minimum] [2:N/A] [N/A:N/A] Bleeding: [1:Pressure] [2:N/A] [N/A:N/A] Hemostasis A chieved: [1:0] [2:N/A] [N/A:N/A] Procedural Pain: [1:0] [2:N/A] [N/A:N/A] Post Procedural Pain: [1:Procedure was tolerated well] [2:N/A] [N/A:N/A] Debridement Treatment Response: [1:1x1x0.1] [2:N/A] [N/A:N/A] Post Debridement Measurements L x W x D (cm) [1:0.079] [2:N/A] [N/A:N/A] Post Debridement Volume: (cm) [1:Debridement] [2:N/A] [N/A:N/A] Treatment Notes Electronic Signature(s) Signed: 03/09/2020 5:00:15 PM By: Linton Ham MD Signed: 03/09/2020 5:09:44 PM By: Deon Pilling Entered By: Linton Ham on 03/09/2020 09:22:54 -------------------------------------------------------------------------------- Multi-Disciplinary Care Plan Details Patient Name: Date of Service: Paula Jenetta Downer RE, [name] 03/09/2020 9:00 A M Medical Record Number: [id number] Patient Account Number: [id number] Date of Birth/Sex: Treating RN: Jun 06, 1950 (69 y.o. Helene Shoe, Tammi Klippel Primary Care Theona Muhs: Nicholes Stairs Other Clinician: Referring Avelina Mcclurkin: Treating Deunte Bledsoe/Extender: Nyra Jabs in Treatment: 1 Active Inactive Nutrition Nursing Diagnoses: Potential for alteratiion in Nutrition/Potential for imbalanced nutrition Goals: Patient/caregiver agrees to and verbalizes understanding of need to obtain nutritional consultation Date Initiated: 03/02/2020 Target Resolution Date: 04/07/2020 Goal Status: Active Interventions: Provide education on elevated blood sugars and impact on wound healing Provide education on nutrition Treatment Activities: Patient referred to Primary Care Physician for further nutritional evaluation : 03/02/2020 Notes: Pain, Acute or Chronic Nursing Diagnoses: Pain, acute or chronic: actual or  potential Potential alteration in comfort, pain Goals: Patient will verbalize adequate pain control and receive pain control interventions during procedures as needed Date Initiated: 03/02/2020 Target Resolution Date: 04/07/2020 Goal Status: Active Patient/caregiver will verbalize comfort level met Date Initiated: 03/02/2020 Target Resolution Date: 04/07/2020 Goal Status: Active Interventions: Encourage patient to take pain medications as prescribed Provide education on pain management Reposition patient for comfort Treatment Activities: Administer pain control measures as ordered : 03/02/2020 Notes: Wound/Skin Impairment Nursing Diagnoses: Knowledge deficit related to ulceration/compromised skin integrity Goals: Patient/caregiver will verbalize understanding of skin care regimen Date Initiated: 03/02/2020 Target Resolution Date: 04/07/2020 Goal Status: Active Interventions: Assess patient/caregiver ability to perform ulcer/skin care regimen upon admission and as needed Provide education on ulcer and skin care Treatment Activities: Skin care regimen initiated : 03/02/2020 Topical wound management initiated : 03/02/2020 Notes: Electronic Signature(s) Signed: 03/09/2020 5:09:44 PM By: Deon Pilling Entered By: Deon Pilling on 03/09/2020 08:42:16 -------------------------------------------------------------------------------- Pain Assessment Details Patient Name: Date of Service: Georgie Chard A. 03/09/2020 9:00 A M Medical Record Number: [id number] Patient Account Number: [id number] Date of Birth/Sex: Treating RN: August 27, 1950 (69 y.o. [name] Primary Care Bralyn Espino: Nicholes Stairs Other Clinician: Referring Avari Gelles: Treating Reign Bartnick/Extender: Dellia Wheeler  Alfonse Alpers in Treatment: 1 Active Problems Location of Pain Severity and Description of Pain Patient Has Paino No Site Locations Pain Management and Medication Current Pain  Management: Electronic Signature(s) Signed: 03/13/2020 5:51:53 PM By: Levan Hurst RN, BSN Entered By: Levan Hurst on 03/09/2020 08:50:20 -------------------------------------------------------------------------------- Patient/Caregiver Education Details Patient Name: Date of Service: Paula Wheeler A. 11/11/2021andnbsp9:00 A M Medical Record Number: [id number] Patient Account Number: [id number] Date of Birth/Gender: Treating RN: 02-28-1951 (70 y.o. Helene Shoe, Tammi Klippel Primary Care Physician: Nicholes Stairs Other Clinician: Referring Physician: Treating Physician/Extender: Nyra Jabs in Treatment: 1 Education Assessment Education Provided To: Patient Education Topics Provided Elevated Blood Sugar/ Impact on Healing: Handouts: Elevated Blood Sugars: How Do They Affect Wound Healing Methods: Explain/Verbal Responses: Reinforcements needed Tissue Oxygenation: Handouts: Peripheral Arterial Disease and Related Ulcers Methods: Explain/Verbal Responses: Reinforcements needed Electronic Signature(s) Signed: 03/09/2020 5:09:44 PM By: Deon Pilling Entered By: Deon Pilling on 03/09/2020 08:42:35 -------------------------------------------------------------------------------- Wound Assessment Details Patient Name: Date of Service: Paula Wheeler A. 03/09/2020 9:00 A M Medical Record Number: [id number] Patient Account Number: [id number] Date of Birth/Sex: Treating RN: 11/25/1950 (69 y.o. Helene Shoe, Meta.Reding Primary Care Taijon Vink: Nicholes Stairs Other Clinician: Referring Aralynn Brake: Treating Ellan Tess/Extender: Nyra Jabs in Treatment: 1 Wound Status Wound Number: 1 Primary Diabetic Wound/Ulcer of the Lower Extremity Etiology: Wound Location: Left Calcaneus Wound Open Wounding Event: Gradually Appeared Status: Date Acquired: 11/17/2019 Comorbid Chronic Obstructive Pulmonary Disease (COPD), Congestive Weeks Of  Treatment: 1 History: Heart Failure, Hypertension, Type II Diabetes Clustered Wound: No Photos Photo Uploaded By: Mikeal Hawthorne on 03/09/2020 11:04:39 Wound Measurements Length: (cm) 1 Width: (cm) 1 Depth: (cm) 0.1 Area: (cm) 0.785 Volume: (cm) 0.079 % Reduction in Area: -38.9% % Reduction in Volume: -38.6% Epithelialization: Large (67-100%) Tunneling: No Undermining: No Wound Description Classification: Grade 2 Wound Margin: Distinct, outline attached Exudate Amount: None Present Foul Odor After Cleansing: No Slough/Fibrino No Wound Bed Granulation Amount: None Present (0%) Exposed Structure Necrotic Amount: None Present (0%) Fascia Exposed: No Fat Layer (Subcutaneous Tissue) Exposed: No Tendon Exposed: No Muscle Exposed: No Joint Exposed: No Bone Exposed: No Treatment Notes Wound #1 (Left Calcaneus) 2. Periwound Care Moisturizing lotion 3. Primary Dressing Applied Collegen AG Hydrogel or K-Y Jelly 4. Secondary Dressing Dry Gauze Heel Cup 6. Support Layer Holiday representative) Signed: 03/09/2020 5:09:44 PM By: Deon Pilling Entered By: Deon Pilling on 03/09/2020 09:18:58 -------------------------------------------------------------------------------- Wound Assessment Details Patient Name: Date of Service: Paula Wheeler A. 03/09/2020 9:00 A M Medical Record Number: [id number] Patient Account Number: [id number] Date of Birth/Sex: Treating RN: 1951/04/16 (69 y.o. [name] Primary Care Joeseph Verville: Nicholes Stairs Other Clinician: Referring Albie Arizpe: Treating Ladean Steinmeyer/Extender: Nyra Jabs in Treatment: 1 Wound Status Wound Number: 2 Primary Pressure Ulcer Etiology: Wound Location: Right Lower Leg Wound Open Wounding Event: Gradually Appeared Status: Date Acquired: 11/10/2019 Comorbid Chronic Obstructive Pulmonary Disease (COPD), Congestive Weeks Of Treatment: 1 History: Heart Failure,  Hypertension, Type II Diabetes Clustered Wound: No Photos Photo Uploaded By: Mikeal Hawthorne on 03/09/2020 13:57:03 Wound Measurements Length: (cm) Width: (cm) Depth: (cm) Area: (cm) Volume: (cm) 0 % Reduction in Area: 100% 0 % Reduction in Volume: 100% 0 Epithelialization: Large (67-100%) 0 Tunneling: No 0 Undermining: No Wound Description Classification: Category/Stage II Exudate Amount: None Present Foul Odor After Cleansing: No Slough/Fibrino No Wound Bed Granulation Amount: None Present (0%) Exposed Structure Necrotic Amount: None  Present (0%) Fascia Exposed: No Fat Layer (Subcutaneous Tissue) Exposed: No Tendon Exposed: No Muscle Exposed: No Joint Exposed: No Bone Exposed: No Electronic Signature(s) Signed: 03/13/2020 5:51:53 PM By: Levan Hurst RN, BSN Entered By: Levan Hurst on 03/09/2020 09:00:07 -------------------------------------------------------------------------------- Vitals Details Patient Name: Date of Service: Paula Kathe Mariner, [name] 03/09/2020 9:00 A M Medical Record Number: [id number] Patient Account Number: [id number] Date of Birth/Sex: Treating RN: [birth year] (69 y.o. [name] Primary Care Norva Bowe: Nicholes Stairs Other Clinician: Referring Maximo Spratling: Treating Abbagail Scaff/Extender: Nyra Jabs in Treatment: 1 Vital Signs Time Taken: 08:49 Temperature (F): 98.4 Height (in): 65 Pulse (bpm): 94 Weight (lbs): 230 Respiratory Rate (breaths/min): 18 Body Mass Index (BMI): 38.3 Blood Pressure (mmHg): 149/79 Capillary Blood Glucose (mg/dl): 151 Reference Range: 80 - 120 mg / dl Notes glucose per pt report Electronic Signature(s) Signed: 03/13/2020 5:51:53 PM By: Levan Hurst RN, BSN Entered By: Levan Hurst on 03/09/2020 08:50:15

## 2020-03-17 ENCOUNTER — Encounter (HOSPITAL_BASED_OUTPATIENT_CLINIC_OR_DEPARTMENT_OTHER): Payer: BC Managed Care – PPO | Admitting: Internal Medicine

## 2020-03-17 ENCOUNTER — Other Ambulatory Visit: Payer: Self-pay

## 2020-03-17 DIAGNOSIS — E1151 Type 2 diabetes mellitus with diabetic peripheral angiopathy without gangrene: Secondary | ICD-10-CM | POA: Diagnosis not present

## 2020-03-17 NOTE — Progress Notes (Signed)
ARIZA, EVANS ([id number]) Visit Report for 03/17/2020 Arrival Information Details Patient Name: Date of Service: [name] 03/17/2020 9:00 A M Medical Record Number: [id number] Patient Account Number: [id number] Date of Birth/Sex: Treating RN: Jan 14, 1951 (69 y.Paula. Paula Wheeler Primary Care Paula Wheeler: Paula Wheeler Other Clinician: Referring Ernest Popowski: Treating Rondale Nies/Extender: Nyra Jabs in Treatment: 2 Visit Information History Since Last Visit Added or deleted any medications: Yes Patient Arrived: Ambulatory Any new allergies or adverse reactions: No Arrival Time: 08:54 Had a fall or experienced change in No Accompanied By: self activities of daily living that may affect Transfer Assistance: None risk of falls: Patient Identification Verified: Yes Signs or symptoms of abuse/neglect since last visito No Secondary Verification Process Completed: Yes Hospitalized since last visit: No Patient Requires Transmission-Based Precautions: No Implantable device outside of the clinic excluding No Patient Has Alerts: No cellular tissue based products placed in the center since last visit: Has Dressing in Place as Prescribed: Yes Has Compression in Place as Prescribed: Yes Pain Present Now: Yes Electronic Signature(s) Signed: 03/17/2020 12:04:29 PM By: Baruch Gouty RN, BSN Entered By: Baruch Gouty on 03/17/2020 08:57:22 -------------------------------------------------------------------------------- Clinic Level of Care Assessment Details Patient Name: Date of Service: MO Paula Wheeler, Paula Och A. 03/17/2020 9:00 A M Medical Record Number: [id number] Patient Account Number: [id number] Date of Birth/Sex: Treating RN: [birth year] (42 y.Paula. Elam Dutch Primary Care Christinna Sprung: Paula Wheeler Other Clinician: Referring Royer Cristobal: Treating Ananiah Maciolek/Extender: Nyra Jabs in Treatment: 2 Clinic Level of Care  Assessment Items TOOL 4 Quantity Score []  - 0 Use when only an EandM is performed on FOLLOW-UP visit ASSESSMENTS - Nursing Assessment / Reassessment X- 1 10 Reassessment of Co-morbidities (includes updates in patient status) X- 1 5 Reassessment of Adherence to Treatment Plan ASSESSMENTS - Wound and Skin A ssessment / Reassessment X - Simple Wound Assessment / Reassessment - one wound 1 5 []  - 0 Complex Wound Assessment / Reassessment - multiple wounds []  - 0 Dermatologic / Skin Assessment (not related to wound area) ASSESSMENTS - Focused Assessment []  - 0 Circumferential Edema Measurements - multi extremities []  - 0 Nutritional Assessment / Counseling / Intervention []  - 0 Lower Extremity Assessment (monofilament, tuning fork, pulses) []  - 0 Peripheral Arterial Disease Assessment (using hand held doppler) ASSESSMENTS - Ostomy and/or Continence Assessment and Care []  - 0 Incontinence Assessment and Management []  - 0 Ostomy Care Assessment and Management (repouching, etc.) PROCESS - Coordination of Care X - Simple Patient / Family Education for ongoing care 1 15 []  - 0 Complex (extensive) Patient / Family Education for ongoing care X- 1 10 Staff obtains Programmer, systems, Records, T Results / Process Orders est []  - 0 Staff telephones HHA, Nursing Homes / Clarify orders / etc []  - 0 Routine Transfer to another Facility (non-emergent condition) []  - 0 Routine Hospital Admission (non-emergent condition) []  - 0 New Admissions / Biomedical engineer / Ordering NPWT Apligraf, etc. , []  - 0 Emergency Hospital Admission (emergent condition) X- 1 10 Simple Discharge Coordination []  - 0 Complex (extensive) Discharge Coordination PROCESS - Special Needs []  - 0 Pediatric / Minor Patient Management []  - 0 Isolation Patient Management []  - 0 Hearing / Language / Visual special needs []  - 0 Assessment of Community assistance (transportation, D/C planning, etc.) []  -  0 Additional assistance / Altered mentation []  - 0 Support Surface(s) Assessment (bed, cushion, seat, etc.) INTERVENTIONS - Wound Cleansing / Measurement X -  Simple Wound Cleansing - one wound 1 5 []  - 0 Complex Wound Cleansing - multiple wounds []  - 0 Wound Imaging (photographs - any number of wounds) []  - 0 Wound Tracing (instead of photographs) []  - 0 Simple Wound Measurement - one wound []  - 0 Complex Wound Measurement - multiple wounds INTERVENTIONS - Wound Dressings X - Small Wound Dressing one or multiple wounds 1 10 []  - 0 Medium Wound Dressing one or multiple wounds []  - 0 Large Wound Dressing one or multiple wounds X- 1 5 Application of Medications - topical []  - 0 Application of Medications - injection INTERVENTIONS - Miscellaneous []  - 0 External ear exam []  - 0 Specimen Collection (cultures, biopsies, blood, body fluids, etc.) []  - 0 Specimen(s) / Culture(s) sent or taken to Lab for analysis []  - 0 Patient Transfer (multiple staff / Civil Service fast streamer / Similar devices) []  - 0 Simple Staple / Suture removal (25 or less) []  - 0 Complex Staple / Suture removal (26 or more) []  - 0 Hypo / Hyperglycemic Management (close monitor of Blood Glucose) []  - 0 Ankle / Brachial Index (ABI) - do not check if billed separately X- 1 5 Vital Signs Has the patient been seen at the hospital within the last three years: Yes Total Score: 80 Level Of Care: New/Established - Level 3 Electronic Signature(s) Signed: 03/17/2020 12:04:29 PM By: Baruch Gouty RN, BSN Entered By: Baruch Gouty on 03/17/2020 09:13:48 -------------------------------------------------------------------------------- Encounter Discharge Information Details Patient Name: Date of Service: MO Paula Wheeler, [name] 03/17/2020 9:00 A M Medical Record Number: [id number] Patient Account Number: [id number] Date of Birth/Sex: Treating RN: 02/20/1951 (21 y.Paula. Elam Dutch Primary Care Iona Stay: Paula Wheeler Other Clinician: Referring Ericca Labra: Treating Anival Pasha/Extender: Nyra Jabs in Treatment: 2 Encounter Discharge Information Items Discharge Condition: Stable Ambulatory Status: Ambulatory Discharge Destination: Home Transportation: Private Auto Accompanied By: self Schedule Follow-up Appointment: Yes Clinical Summary of Care: Patient Declined Electronic Signature(s) Signed: 03/17/2020 12:04:29 PM By: Baruch Gouty RN, BSN Entered By: Baruch Gouty on 03/17/2020 09:15:38 -------------------------------------------------------------------------------- Pain Assessment Details Patient Name: Date of Service: MO Jenetta Downer Wheeler, [name] 03/17/2020 9:00 A M Medical Record Number: [id number] Patient Account Number: [id number] Date of Birth/Sex: Treating RN: 10-28-1950 (69 y.Paula. Elam Dutch Primary Care Paelyn Smick: Paula Wheeler Other Clinician: Referring Erielle Gawronski: Treating Kaliope Quinonez/Extender: Nyra Jabs in Treatment: 2 Active Problems Location of Pain Severity and Description of Pain Patient Has Paino Yes Site Locations Pain Location: Pain Location: Pain in Ulcers With Dressing Change: Yes Rate the pain. Current Pain Level: 5 Worst Pain Level: 8 Character of Pain Describe the Pain: Aching, Tender Pain Management and Medication Current Pain Management: Medication: Yes Is the Current Pain Management Adequate: Adequate How does your wound impact your activities of daily livingo Sleep: Yes Bathing: No Appetite: No Relationship With Others: No Bladder Continence: No Emotions: No Bowel Continence: No Work: No Toileting: No Drive: No Dressing: No Hobbies: No Electronic Signature(s) Signed: 03/17/2020 12:04:29 PM By: Baruch Gouty RN, BSN Entered By: Baruch Gouty on 03/17/2020 08:54:41 -------------------------------------------------------------------------------- Patient/Caregiver Education Details Patient  Name: Date of Service: [name] 11/19/2021andnbsp9:00 A M Medical Record Number: [id number] Patient Account Number: [id number] Date of Birth/Gender: Treating RN: 06-Jan-1951 (63 y.Paula. Elam Dutch Primary Care Physician: Paula Wheeler Other Clinician: Referring Physician: Treating Physician/Extender: Nyra Jabs in Treatment: 2 Education Assessment Education Provided To: Patient Education Topics Provided  Pressure: Methods: Explain/Verbal Responses: Reinforcements needed, State content correctly Venous: Methods: Explain/Verbal Responses: Reinforcements needed, State content correctly Electronic Signature(s) Signed: 03/17/2020 12:04:29 PM By: Baruch Gouty RN, BSN Entered By: Baruch Gouty on 03/17/2020 09:15:24 -------------------------------------------------------------------------------- Wound Assessment Details Patient Name: Date of Service: MO Jenetta Downer Wheeler, [name] 03/17/2020 9:00 A M Medical Record Number: [id number] Patient Account Number: [id number] Date of Birth/Sex: Treating RN: 1950/06/26 (39 y.Paula. Elam Dutch Primary Care Paiten Boies: Paula Wheeler Other Clinician: Referring Kristyana Notte: Treating Sloane Junkin/Extender: Nyra Jabs in Treatment: 2 Wound Status Wound Number: 1 Primary Diabetic Wound/Ulcer of the Lower Extremity Etiology: Wound Location: Left Calcaneus Wound Open Wounding Event: Gradually Appeared Status: Date Acquired: 11/17/2019 Comorbid Chronic Obstructive Pulmonary Disease (COPD), Congestive Weeks Of Treatment: 2 History: Heart Failure, Hypertension, Type II Diabetes Clustered Wound: No Wound Measurements Length: (cm) 0.1 Width: (cm) 0.1 Depth: (cm) 0.1 Area: (cm) 0.008 Volume: (cm) 0.001 % Reduction in Area: 98.6% % Reduction in Volume: 98.2% Epithelialization: Large (67-100%) Tunneling: No Undermining: No Wound Description Classification: Grade 2 Wound  Margin: Distinct, outline attached Exudate Amount: Small Exudate Type: Serosanguineous Exudate Color: red, brown Foul Odor After Cleansing: No Slough/Fibrino No Wound Bed Granulation Amount: Small (1-33%) Exposed Structure Granulation Quality: Red Fascia Exposed: No Necrotic Amount: None Present (0%) Fat Layer (Subcutaneous Tissue) Exposed: No Tendon Exposed: No Muscle Exposed: No Joint Exposed: No Bone Exposed: No Limited to Skin Breakdown Treatment Notes Wound #1 (Left Calcaneus) 2. Periwound Care Moisturizing lotion TCA Cream 3. Primary Dressing Applied Collegen AG 4. Secondary Dressing Dry Gauze Heel Cup 6. Support Layer Holiday representative) Signed: 03/17/2020 12:04:29 PM By: Baruch Gouty RN, BSN Entered By: Baruch Gouty on 03/17/2020 09:13:13 -------------------------------------------------------------------------------- Vitals Details Patient Name: Date of Service: MO Jenetta Downer Wheeler, [name] 03/17/2020 9:00 A M Medical Record Number: [id number] Patient Account Number: [id number] Date of Birth/Sex: Treating RN: Mar 30, 1951 (48 y.Paula. Elam Dutch Primary Care Jaice Digioia: Paula Wheeler Other Clinician: Referring Tonio Seider: Treating Trevor Wilkie/Extender: Nyra Jabs in Treatment: 2 Vital Signs Time Taken: 08:58 Temperature (F): 98.4 Height (in): 65 Pulse (bpm): 87 Source: Stated Respiratory Rate (breaths/min): 18 Weight (lbs): 230 Blood Pressure (mmHg): 159/71 Source: Stated Capillary Blood Glucose (mg/dl): 151 Body Mass Index (BMI): 38.3 Reference Range: 80 - 120 mg / dl Notes glucose per pt report last night Electronic Signature(s) Signed: 03/17/2020 12:04:29 PM By: Baruch Gouty RN, BSN Entered By: Baruch Gouty on 03/17/2020 08:58:57

## 2020-03-22 ENCOUNTER — Other Ambulatory Visit: Payer: Self-pay

## 2020-03-22 ENCOUNTER — Encounter (HOSPITAL_BASED_OUTPATIENT_CLINIC_OR_DEPARTMENT_OTHER): Payer: BC Managed Care – PPO | Admitting: Physician Assistant

## 2020-03-22 DIAGNOSIS — E1151 Type 2 diabetes mellitus with diabetic peripheral angiopathy without gangrene: Secondary | ICD-10-CM | POA: Diagnosis not present

## 2020-03-22 NOTE — Progress Notes (Addendum)
JOHNANNA, BAKKE ([id number]) Visit Report for 03/22/2020 Chief Complaint Document Details Patient Name: Date of Service: Georgie Chard A. 03/22/2020 9:00 A M Medical Record Number: [id number] Patient Account Number: [id number] Date of Birth/Sex: Treating RN: 07-17-1950 (69 y.o. Elam Dutch Primary Care Provider: Nicholes Stairs Other Clinician: Referring Provider: Treating Provider/Extender: Dominga Ferry in Treatment: 2 Information Obtained from: Patient Chief Complaint 03/02/2020; patient is here for review of wounds on her bilateral lower extremities Electronic Signature(s) Signed: 03/22/2020 9:04:08 AM By: Worthy Keeler PA-C Entered By: Worthy Keeler on 03/22/2020 09:04:08 -------------------------------------------------------------------------------- HPI Details Patient Name: Date of Service: MO O RE, [name] 03/22/2020 9:00 A M Medical Record Number: [id number] Patient Account Number: [id number] Date of Birth/Sex: Treating RN: 08-22-1950 (69 y.o. Elam Dutch Primary Care Provider: Nicholes Stairs Other Clinician: Referring Provider: Treating Provider/Extender: Dominga Ferry in Treatment: 2 History of Present Illness HPI Description: ADMISSION 03/02/2020 This is a 69 year old woman who is here for review of wounds on her bilateral lower extremities. She states these happen to her when she was hospitalized in late July early August. She was critically ill for a period of time suggestion of DKA with acute kidney failure and Metformin induced lactic acidosis. She had a CTA of the pelvis at the time showed chronic PAD but no evidence of acute ischemia. Patient states she came out of the hospital with really sizable wounds on her bilateral lower extremities. They have since gotten smaller but she cannot get them to close. She has wounds on her lower left Achilles area as well as the right anterior tibial  area. She has compression stockings although she has not been wearing them recently stating her primary doctor told her not to. Past medical history includes heart failure with preserved ejection fraction, hypertension, hyperlipidemia, COPD, anemia, stage IV chronic renal failure, hospitalization in July with diabetic ketoacidosis, type 2 diabetes. ABIs in our clinic were not obtainable. In August 2019 her ABI in the right was 0.42 on the left 0.50. TBI in the left was 0.31 not obtained on the right. Both sides pinpoint waveforms were monophasic this is quite a bit worse than what was recorded in her last ABI on 04/15/2016 at which time her ABI in the right was 0.99 and a lift left 0.80. Dr. Carlis Abbott saw her as an outpatient in March and recommended angiography because of complaints of claudication with minimal activity. She was also seen in the hospital in July at which time she was on pressors to support her blood pressure which probably aggravated her lower extremity ischemia. I think she was felt to be back to her baseline after the pressors were stopped. She had blisters but no wounds on her feet. Currently the patient can walk I think she does have minimal distance claudication and perhaps even claudication at night although it is difficult to be certain about this given her history 11/11; patient I admitted to the clinic last week. She has type 2 diabetes with PAD as well as chronic venous insufficiency. She came in with open wounds on her left Achilles heel and on the right medial lower leg. The right medial lower leg is healed today. 03/22/2020 on evaluation today patient actually appears to be doing excellent in regard to her wound at this point. In fact everything appears to be healed currently. There is no signs of active infection at this time. Electronic Signature(s)  Signed: 03/22/2020 9:57:53 AM By: Worthy Keeler PA-C Entered By: Worthy Keeler on 03/22/2020  09:57:53 -------------------------------------------------------------------------------- Physical Exam Details Patient Name: Date of Service: MO O RE, [name] 03/22/2020 9:00 A M Medical Record Number: [id number] Patient Account Number: [id number] Date of Birth/Sex: Treating RN: October 22, 1950 (69 y.o. Elam Dutch Primary Care Provider: Nicholes Stairs Other Clinician: Referring Provider: Treating Provider/Extender: Dominga Ferry in Treatment: 2 Constitutional Well-nourished and well-hydrated in no acute distress. Respiratory normal breathing without difficulty. Psychiatric this patient is able to make decisions and demonstrates good insight into disease process. Alert and Oriented x 3. pleasant and cooperative. Notes Patient's wound bed actually showed signs of complete epithelization. Everything appears to be doing great. Electronic Signature(s) Signed: 03/22/2020 9:58:18 AM By: Worthy Keeler PA-C Entered By: Worthy Keeler on 03/22/2020 09:58:17 -------------------------------------------------------------------------------- Physician Orders Details Patient Name: Date of Service: MO O RE, [name] 03/22/2020 9:00 A M Medical Record Number: [id number] Patient Account Number: [id number] Date of Birth/Sex: Treating RN: 1950/08/01 (69 y.o. 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 Edema Control Avoid standing for long periods of time Elevate legs to the level of the heart or above for 30 minutes daily and/or when sitting, a frequency of: - throughout the day. Exercise regularly Support Garment 10-20 mm/Hg pressure to: - compression stockings daily both legs Additional Orders / Instructions Other: - patient to decide to follow back up or not with Dr. Carlis Abbott concerning angiogram. Electronic Signature(s) Signed: 03/22/2020 4:58:27 PM By: Baruch Gouty RN, BSN Signed: 03/22/2020 5:04:05 PM By: Worthy Keeler PA-C Entered By: Baruch Gouty on 03/22/2020 09:48:34 -------------------------------------------------------------------------------- Problem List Details Patient Name: Date of Service: MO Jenetta Downer RE, [name] 03/22/2020 9:00 A M Medical Record Number: [id number] Patient Account Number: [id number] Date of Birth/Sex: Treating RN: 12-14-1950 (69 y.o. Martyn Malay, Linda Primary Care Provider: Nicholes Stairs Other Clinician: Referring Provider: Treating Provider/Extender: Dominga Ferry in Treatment: 2 Active Problems ICD-10 Encounter Code Description Active Date MDM Diagnosis E11.51 Type 2 diabetes mellitus with diabetic peripheral angiopathy without gangrene 03/02/2020 No Yes L97.421 Non-pressure chronic ulcer of left heel and midfoot limited to breakdown of 03/02/2020 No Yes skin L97.811 Non-pressure chronic ulcer of other part of right lower leg limited to breakdown 03/02/2020 No Yes of skin I87.323 Chronic venous hypertension (idiopathic) with inflammation of bilateral lower 03/02/2020 No Yes extremity E11.42 Type 2 diabetes mellitus with diabetic polyneuropathy  03/02/2020 No Yes Inactive Problems Resolved Problems Electronic Signature(s) Signed: 03/22/2020 9:03:57 AM By: Worthy Keeler PA-C Entered By: Worthy Keeler on 03/22/2020 09:03:56 -------------------------------------------------------------------------------- Progress Note Details Patient Name: Date of Service: [place], [name] 03/22/2020 9:00 A M Medical Record Number: [id number] Patient Account Number:  [id number] Date of Birth/Sex: Treating RN: August 02, 1950 (69 y.o. Elam Dutch Primary Care Provider: Nicholes Stairs Other Clinician: Referring Provider: Treating Provider/Extender: Dominga Ferry in Treatment: 2 Subjective Chief Complaint Information obtained from Patient 03/02/2020; patient is here for review of wounds on her bilateral lower extremities History of Present Illness (HPI) ADMISSION 03/02/2020 This is a 69 year old woman who is here for review of wounds on her bilateral lower extremities. She states these happen to her when she was hospitalized in late July early August. She was critically ill for a period of time suggestion of DKA with acute kidney failure and Metformin induced lactic acidosis. She had a CTA of the pelvis at the time showed chronic PAD but no evidence of acute ischemia. Patient states she came out of the hospital with really sizable wounds on her bilateral lower extremities. They have since gotten smaller but she cannot get them to close. She has wounds on her lower left Achilles area as well as the right anterior tibial area. She has compression stockings although she has not been wearing them recently stating her primary doctor told her not to. Past medical history includes heart failure with preserved ejection fraction, hypertension, hyperlipidemia, COPD, anemia, stage IV chronic renal failure, hospitalization in July with diabetic ketoacidosis, type 2 diabetes. ABIs in our clinic were not obtainable. In August 2019  her ABI in the right was 0.42 on the left 0.50. TBI in the left was 0.31 not obtained on the right. Both sides pinpoint waveforms were monophasic this is quite a bit worse than what was recorded in her last ABI on 04/15/2016 at which time her ABI in the right was 0.99 and a lift left 0.80. Dr. Carlis Abbott saw her as an outpatient in March and recommended angiography because of complaints of claudication with minimal activity. She was also seen in the hospital in July at which time she was on pressors to support her blood pressure which probably aggravated her lower extremity ischemia. I think she was felt to be back to her baseline after the pressors were stopped. She had blisters but no wounds on her feet. Currently the patient can walk I think she does have minimal distance claudication and perhaps even claudication at night although it is difficult to be certain about this given her history 11/11; patient I admitted to the clinic last week. She has type 2 diabetes with PAD as well as chronic venous insufficiency. She came in with open wounds on her left Achilles heel and on the right medial lower leg. The right medial lower leg is healed today. 03/22/2020 on evaluation today patient actually appears to be doing excellent in regard to her wound at this point. In fact everything appears to be healed currently. There is no signs of active infection at this time. Objective Constitutional Well-nourished and well-hydrated in no acute distress. Vitals Time Taken: 8:56 AM, Height: 65 in, Weight: 230 lbs, BMI: 38.3, Temperature: 98.5 F, Pulse: 69 bpm, Respiratory Rate: 18 breaths/min, Blood Pressure: 155/59 mmHg, Capillary Blood Glucose: 139 mg/dl. General Notes: glucose per pt report Respiratory normal breathing without difficulty. Psychiatric this patient is able to make decisions and demonstrates good insight into disease process. Alert and Oriented x 3. pleasant and cooperative. General Notes:  Patient's wound bed actually showed signs of complete epithelization. Everything appears to be doing great. Integumentary (Hair, Skin) Wound #1 status is Healed - Epithelialized. Original cause of wound was Gradually Appeared. The wound is located  on the Left Calcaneus. The wound measures 0cm length x 0cm width x 0cm depth; 0cm^2 area and 0cm^3 volume. There is no tunneling or undermining noted. There is a none present amount of drainage noted. The wound margin is distinct with the outline attached to the wound base. There is no granulation within the wound bed. There is no necrotic tissue within the wound bed. Assessment Active Problems ICD-10 Type 2 diabetes mellitus with diabetic peripheral angiopathy without gangrene Non-pressure chronic ulcer of left heel and midfoot limited to breakdown of skin Non-pressure chronic ulcer of other part of right lower leg limited to breakdown of skin Chronic venous hypertension (idiopathic) with inflammation of bilateral lower extremity Type 2 diabetes mellitus with diabetic polyneuropathy Plan Discharge From [facility] Services: Discharge from [place] Skin Barriers/Peri-Wound Care: Moisturizing lotion - to both legs nightly Wound Cleansing: May shower and wash wound with soap and water. Edema Control: Avoid standing for long periods of time Elevate legs to the level of the heart or above for 30 minutes daily and/or when sitting, a frequency of: - throughout the day. Exercise regularly Support Garment 10-20 mm/Hg pressure to: - compression stockings daily both legs Additional Orders / Instructions: Other: - patient to decide to follow back up or not with Dr. Carlis Abbott concerning angiogram. 1. I would recommend that we go ahead and discontinue the wound care measures at this point as patient is completely healed she does have a compression stocking to put on. 2. I am also going to recommend the patient continues to monitor for any signs of worsening  infection or reopening if she has any issues she should contact the office and let us know. We will see her back for follow-up visit as needed. Electronic Signature(s) Signed: 03/22/2020 9:58:43 AM By: Worthy Keeler PA-C Entered By: Worthy Keeler on 03/22/2020 09:58:42 -------------------------------------------------------------------------------- SuperBill Details Patient Name: Date of Service: MO O RE, [name] 03/22/2020 Medical Record Number: [id number] Patient Account Number: [id number] Date of Birth/Sex: Treating RN: 01/11/1951 (69 y.o. 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

## 2020-03-27 NOTE — Progress Notes (Signed)
DIM, MEISINGER ([id number]) Visit Report for 03/17/2020 SuperBill Details Patient Name: Date of Service: [name] 03/17/2020 Medical Record Number: [id number] Patient Account Number: [id number] Date of Birth/Sex: Treating RN: Dec 20, 1950 (69 y.o. 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

## 2020-03-28 NOTE — Progress Notes (Signed)
[name], [name] ([id number]) Visit Report for 03/22/2020 Arrival Information Details Patient Name: Date of Service: [name] 03/22/2020 9:00 A M Medical Record Number: [id number] Patient Account Number: [id number] Date of Birth/Sex: Treating RN: 02-10-1951 (69 y.Paula. Paula Wheeler, Paula Wheeler Primary Care Paula Wheeler: Nicholes Stairs Other Clinician: Referring Paula Wheeler: Treating Paula Wheeler/Extender: Dominga Ferry in Treatment: 2 Visit Information History Since Last Visit Added or deleted any medications: No Patient Arrived: Ambulatory Any new allergies or adverse reactions: No Arrival Time: 08:55 Had a fall or experienced change in No Accompanied By: alone activities of daily living that may affect Transfer Assistance: None risk of falls: Patient Identification Verified: Yes Signs or symptoms of abuse/neglect since last visito No Secondary Verification Process Completed: Yes Hospitalized since last visit: No Patient Requires Transmission-Based Precautions: No Implantable device outside of the clinic excluding No Patient Has Alerts: No cellular tissue based products placed in the center since last visit: Has Dressing in Place as Prescribed: Yes Has Compression in Place as Prescribed: Yes Pain Present Now: No Electronic Signature(s) Signed: 03/28/2020 6:12:47 PM By: Levan Hurst RN, BSN Entered By: Levan Hurst on 03/22/2020 08:56:41 -------------------------------------------------------------------------------- Clinic Level of Care Assessment Details Patient Name: Date of Service: MO Paula Wheeler, Paula Och A. 03/22/2020 9:00 A M Medical Record Number: [id number] Patient Account Number: [id number] Date of Birth/Sex: Treating RN: 1951/03/18 (53 y.Paula. Paula Wheeler Primary Care Paula Wheeler: Nicholes Stairs Other Clinician: Referring Paula Wheeler: Treating Paula Wheeler/Extender: Dominga Ferry in Treatment: 2 Clinic Level of Care  Assessment Items TOOL 4 Quantity Score []  - 0 Use when only an EandM is performed on FOLLOW-UP visit ASSESSMENTS - Nursing Assessment / Reassessment X- 1 10 Reassessment of Co-morbidities (includes updates in patient status) X- 1 5 Reassessment of Adherence to Treatment Plan ASSESSMENTS - Wound and Skin A ssessment / Reassessment X - Simple Wound Assessment / Reassessment - one wound 1 5 []  - 0 Complex Wound Assessment / Reassessment - multiple wounds []  - 0 Dermatologic / Skin Assessment (not related to wound area) ASSESSMENTS - Focused Assessment X- 1 5 Circumferential Edema Measurements - multi extremities []  - 0 Nutritional Assessment / Counseling / Intervention X- 1 5 Lower Extremity Assessment (monofilament, tuning fork, pulses) []  - 0 Peripheral Arterial Disease Assessment (using hand held doppler) ASSESSMENTS - Ostomy and/or Continence Assessment and Care []  - 0 Incontinence Assessment and Management []  - 0 Ostomy Care Assessment and Management (repouching, etc.) PROCESS - Coordination of Care X - Simple Patient / Family Education for ongoing care 1 15 []  - 0 Complex (extensive) Patient / Family Education for ongoing care X- 1 10 Staff obtains Programmer, systems, Records, T Results / Process Orders est []  - 0 Staff telephones HHA, Nursing Homes / Clarify orders / etc []  - 0 Routine Transfer to another Facility (non-emergent condition) []  - 0 Routine Hospital Admission (non-emergent condition) []  - 0 New Admissions / Biomedical engineer / Ordering NPWT Apligraf, etc. , []  - 0 Emergency Hospital Admission (emergent condition) X- 1 10 Simple Discharge Coordination []  - 0 Complex (extensive) Discharge Coordination PROCESS - Special Needs []  - 0 Pediatric / Minor Patient Management []  - 0 Isolation Patient Management []  - 0 Hearing / Language / Visual special needs []  - 0 Assessment of Community assistance (transportation, D/C planning, etc.) []  -  0 Additional assistance / Altered mentation []  - 0 Support Surface(s) Assessment (bed, cushion, seat, etc.) INTERVENTIONS - Wound Cleansing / Measurement  X - Simple Wound Cleansing - one wound 1 5 []  - 0 Complex Wound Cleansing - multiple wounds X- 1 5 Wound Imaging (photographs - any number of wounds) []  - 0 Wound Tracing (instead of photographs) []  - 0 Simple Wound Measurement - one wound []  - 0 Complex Wound Measurement - multiple wounds INTERVENTIONS - Wound Dressings []  - 0 Small Wound Dressing one or multiple wounds []  - 0 Medium Wound Dressing one or multiple wounds []  - 0 Large Wound Dressing one or multiple wounds []  - 0 Application of Medications - topical []  - 0 Application of Medications - injection INTERVENTIONS - Miscellaneous []  - 0 External ear exam []  - 0 Specimen Collection (cultures, biopsies, blood, body fluids, etc.) []  - 0 Specimen(s) / Culture(s) sent or taken to Lab for analysis []  - 0 Patient Transfer (multiple staff / Civil Service fast streamer / Similar devices) []  - 0 Simple Staple / Suture removal (25 or less) []  - 0 Complex Staple / Suture removal (26 or more) []  - 0 Hypo / Hyperglycemic Management (close monitor of Blood Glucose) []  - 0 Ankle / Brachial Index (ABI) - do not check if billed separately X- 1 5 Vital Signs Has the patient been seen at the hospital within the last three years: Yes Total Score: 80 Level Of Care: New/Established - Level 3 Electronic Signature(s) Signed: 03/22/2020 4:58:27 PM By: Baruch Gouty RN, BSN Entered By: Baruch Gouty on 03/22/2020 09:50:10 -------------------------------------------------------------------------------- Lower Extremity Assessment Details Patient Name: Date of Service: MO Paula Wheeler, [name] 03/22/2020 9:00 A M Medical Record Number: [id number] Patient Account Number: [id number] Date of Birth/Sex: Treating RN: 1951/03/28 (29 y.Paula. Paula Wheeler Primary Care Shelby Anderle: Nicholes Stairs Other  Clinician: Referring Afrah Burlison: Treating Paula Wheeler/Extender: Dominga Ferry in Treatment: 2 Edema Assessment Assessed: [Left: No] [Right: No] Edema: [Left: Yes] [Right: Yes] Calf Left: Right: Point of Measurement: 40 cm From Medial Instep 35 cm 42 cm Ankle Left: Right: Point of Measurement: 10 cm From Medial Instep 22 cm 21.2 cm Vascular Assessment Pulses: Dorsalis Pedis Palpable: [Left:Yes] Electronic Signature(s) Signed: 03/28/2020 6:12:47 PM By: Levan Hurst RN, BSN Entered By: Levan Hurst on 03/22/2020 08:58:08 -------------------------------------------------------------------------------- [place] Details Patient Name: Date of Service: MO Kathe Mariner, [name] 03/22/2020 9:00 A M Medical Record Number: [id number] Patient Account Number: [id number] Date of Birth/Sex: Treating RN: 05-Jul-1950 (16 y.Paula. Paula Wheeler Primary Care Amari Zagal: Nicholes Stairs Other Clinician: Referring Dilan Fullenwider: Treating Tynell Winchell/Extender: Dominga Ferry in Treatment: 2 Active Inactive Electronic Signature(s) Signed: 03/22/2020 4:58:27 PM By: Baruch Gouty RN, BSN Entered By: Baruch Gouty on 03/22/2020 09:48:53 -------------------------------------------------------------------------------- Pain Assessment Details Patient Name: Date of Service: Alveta Heimlich Wheeler, [name] 03/22/2020 9:00 A M Medical Record Number: [id number] Patient Account Number: [id number] Date of Birth/Sex: Treating RN: 12/16/1950 (61 y.Paula. Paula Wheeler Primary Care Jakai Risse: Nicholes Stairs Other Clinician: Referring Luciann Gossett: Treating Sudais Banghart/Extender: Dominga Ferry in Treatment: 2 Active Problems Location of Pain Severity and Description of Pain Patient Has Paino No Site Locations Pain Management and Medication Current Pain Management: Electronic Signature(s) Signed: 03/28/2020 6:12:47 PM By: Levan Hurst  RN, BSN Entered By: Levan Hurst on 03/22/2020 08:57:25 -------------------------------------------------------------------------------- Patient/Caregiver Education Details Patient Name: Date of Service: [name] 11/24/2021andnbsp9:00 A M Medical Record Number: [id number] Patient Account Number: [id number] Date of Birth/Gender: Treating RN: February 16, 1951 (61 y.Paula. Paula Wheeler Primary Care Physician:  Little, Priscille Heidelberg Other Clinician: Referring Physician: Treating Physician/Extender: Dominga Ferry in Treatment: 2 Education Assessment Education Provided To: Patient Education Topics Provided Venous: Methods: Explain/Verbal Responses: Reinforcements needed, State content correctly Wound/Skin Impairment: Methods: Explain/Verbal Responses: Reinforcements needed, State content correctly Electronic Signature(s) Signed: 03/22/2020 4:58:27 PM By: Baruch Gouty RN, BSN Entered By: Baruch Gouty on 03/22/2020 09:49:13 -------------------------------------------------------------------------------- Wound Assessment Details Patient Name: Date of Service: MO Jenetta Downer Wheeler, [name] 03/22/2020 9:00 A M Medical Record Number: [id number] Patient Account Number: [id number] Date of Birth/Sex: Treating RN: [birth year] (63 y.Paula. Paula Wheeler Primary Care Latisa Belay: Nicholes Stairs Other Clinician: Referring Nash Bolls: Treating Yaman Grauberger/Extender: Dominga Ferry in Treatment: 2 Wound Status Wound Number: 1 Primary Diabetic Wound/Ulcer of the Lower Extremity Etiology: Wound Location: Left Calcaneus Wound Healed - Epithelialized Wounding Event: Gradually Appeared Status: Date Acquired: 11/17/2019 Comorbid Chronic Obstructive Pulmonary Disease (COPD), Congestive Weeks Of Treatment: 2 History: Heart Failure, Hypertension, Type II Diabetes Clustered Wound: No Wound Measurements Length: (cm) Width: (cm) Depth: (cm) Area:  (cm) Volume: (cm) 0 % Reduction in Area: 100% 0 % Reduction in Volume: 100% 0 Epithelialization: Large (67-100%) 0 Tunneling: No 0 Undermining: No Wound Description Classification: Grade 2 Wound Margin: Distinct, outline attached Exudate Amount: None Present Foul Odor After Cleansing: No Slough/Fibrino No Wound Bed Granulation Amount: None Present (0%) Exposed Structure Necrotic Amount: None Present (0%) Fascia Exposed: No Fat Layer (Subcutaneous Tissue) Exposed: No Tendon Exposed: No Muscle Exposed: No Joint Exposed: No Bone Exposed: No Electronic Signature(s) Signed: 03/28/2020 6:12:47 PM By: Levan Hurst RN, BSN Entered By: Levan Hurst on 03/22/2020 08:59:43 -------------------------------------------------------------------------------- [place] Details Patient Name: Date of Service: MO Kathe Mariner, [name] 03/22/2020 9:00 A M Medical Record Number: [id number] Patient Account Number: [id number] Date of Birth/Sex: Treating RN: July 25, 1950 (52 y.Paula. Paula Wheeler Primary Care Curt Oatis: Nicholes Stairs Other Clinician: Referring Tylin Stradley: Treating Xylon Croom/Extender: Dominga Ferry in Treatment: 2 Vital Signs Time Taken: 08:56 Temperature (F): 98.5 Height (in): 65 Pulse (bpm): 69 Weight (lbs): 230 Respiratory Rate (breaths/min): 18 Body Mass Index (BMI): 38.3 Blood Pressure (mmHg): 155/59 Capillary Blood Glucose (mg/dl): 139 Reference Range: 80 - 120 mg / dl Notes glucose per pt report Electronic Signature(s) Signed: 03/28/2020 6:12:47 PM By: Levan Hurst RN, BSN Entered By: Levan Hurst on 03/22/2020 08:57:15

## 2020-04-01 ENCOUNTER — Other Ambulatory Visit: Payer: Self-pay | Admitting: Cardiovascular Disease

## 2020-04-03 NOTE — Telephone Encounter (Signed)
Pt last saw Dr Acie Fredrickson 03/07/20, last labs 11/29/19 Creat 2.60, age 69, weight 107.2kg, based on specified criteria pt is on appropriate dosage of Eliquis 5mg  BID.  Will refill rx.

## 2020-06-12 ENCOUNTER — Ambulatory Visit: Payer: BC Managed Care – PPO | Admitting: Cardiovascular Disease

## 2020-07-24 ENCOUNTER — Encounter: Payer: Self-pay | Admitting: Cardiovascular Disease

## 2020-07-24 NOTE — Progress Notes (Signed)
Cardiology Office Note   Date:  07/25/2020   ID:  Paula Wheeler, DOB December 26, 1950, MRN [id number]  PCP:  Hulan Fess, MD  Cardiologist:   Mertie Moores, MD   Chief Complaint  Patient presents with  . Atrial Fibrillation   Problem list 1. Essential HTN  2. Hyperlipidemia 3. Cardiomegaly on x-ray 4. Diabetes mellitus 5. Obesity  6. Atrial fib -CHADS2VASC score is 68 ( female, age 70, HTN, PVD)   Nov. 13, 2017: Paula Wheeler is a 70 y.o. female who presents for evaluation of cardiomegaly She requested a CXR to look for lung issues ( is a current smoker) was found have cardiac megaly.  Her main complaint is that her legs give out when she is walking. She does not get any regular exercise.  No CP.   Some shortness of breath with exertion  Has DM,   She is getting better control of her glucose levels recently .    She admits that she eats more salt that she probably should. She's canned soup was on occasions. She put salt on her apples.  Feb. 12, 2018:  Pt was seen by Dr. Fletcher Anon for PVD. Was found to have atrial fib.   CHADS2VASC score is 40 ( female, age 89, HTN, PVD)  Started on Eliquis. Developed a GI bleed.  EGD shows erosive gastropathy.   She was told to stop her Goodies powders and Indomethicin ( which she was taking for gout )  She has seen Dr. Oletta Lamas -  She decided to reduce her Eliquis to 5 mg a day .    July 25, 2016:  Pt is seen back for follow Up of her atrial fibrillation. She was started on L course but she developed a GI bleed. She had been taking lots of goodies powders and indomethacin for gout.  July 25, 2016:  No symptoms related to atrial fib. Still smoking.  Her hands are cold at night  BP is up today .  Her Quinipril was reduced to once a day when she was in the hospital in jan. 2018.   October 28, 2016:  Was cardioverted on April 11 Unfortunately, is back in atrial fib She cannot tell that her HR is irreg.  Remains on Eliquis    Echo from 12. /17 showed normal Lv systolic function   Still smoking   04-30-17:  Mother died recently . No CP or dyspnea  Hx of atria fib   Sep 17, 2017:  Paula Wheeler is seen for follow up of atrial fib  Is having difficulty breathing and her legs are swollen  Dyspnea for the past 3 weeks.  Not associated with any CP Has cut back on her smoking  Wt today is 226.  ( up 15 lbs since Dec. 2018)  Has lack of energy . Seems to feel better when she is on prednisone   She is in persistent atrial fibrillation.  We cardioverted her about a year ago but it only lasted for several weeks.  We discussed going to the A. fib clinic for consideration of Tikosyn therapy.  January 05, 2018: Seen today for follow-up of her atrial fibrillation, hypertension hyperlipidemia. Was started on Pletal - has not started it until she cleared it with me  Still smoking  Previously, we had her on losartan but it affected her renal function.  We also had her on some amlodipine but this caused significant leg edema.  She has stopped both of these medications  and her blood pressure remains well controlled.  March 04, 2018: Paula Wheeler is seen today for follow-up of her atrial fibrillation.  She also has hypertension and hyperlipidemia.  She has been anemic. Hemoglobin was 5.8.  She received 3 units packed red blood cells.  She has chronic kidney disease stage IV. She had not noticed any blood in her stool or black tarry stool   Is scheduled for EGD and colonoscopy Nov. 14,  Our    Jan. 12, 2021 Paula Wheeler is seen today for follow up of her atrial fib,  HTN, hyperlipidemia. Still smoking .   Is in Afib   March 07, 2020:  Is seen today for follow-up of her atrial fibrillation, hypertension, hyperlipidemia.  She has normal left ventricular systolic function by echocardiogram in July, 2021.  We were not able to determine diastolic function.  There were no significant valve problems.   Was in the  hospital in July with respiratory failure and metabolic acidosis, acute kidney injury ,   Has edema in her legs .  Has stopped smoking Has been to nephrology ( [facility])   Is taking torsemide 20 mg PO BID ( not daily as listed in the chart )   July 25, 2020: Paula Wheeler is seen for follow up of her chronic diastolic CHF, atrial fib, HTN, HLD Has lots of claudiation issues Sees Dr. Carlis Abbott at [facility] .  Wt is 235 lbs.   She is on atorvastatin 80 mg a day.  Her lipid levels from her primary medical doctor look good. Total cholesterol is 87 HDL is 28 LDL is 38 Triglyceride level is 111.   Past Medical History:  Diagnosis Date  . Anemia   . COPD (chronic obstructive pulmonary disease) ([place])   . Dyslipidemia   . Exogenous obesity   . GERD (gastroesophageal reflux disease)   . GI bleed 05/14/2016  . History of abnormal cells from cervix   . Hyperlipidemia   . Hypertension   . Iron deficiency anemia   . Longstanding persistent atrial fibrillation ([place])   . Microalbuminuria   . Tobacco dependence   . Type II diabetes mellitus ([place])     Past Surgical History:  Procedure Laterality Date  . CARDIOVERSION N/A 08/07/2016   Procedure: CARDIOVERSION;  Surgeon: Thayer Headings, MD;  Location: [place];  Service: Cardiovascular;  Laterality: N/A;  . DIAGNOSTIC MAMMOGRAM  05/2015  . ESOPHAGOGASTRODUODENOSCOPY N/A 05/15/2016   Procedure: ESOPHAGOGASTRODUODENOSCOPY (EGD);  Surgeon: Wonda Horner, MD;  Location: [facility] ENDOSCOPY;  Service: Endoscopy;  Laterality: N/A;  . [place]   "I think they left my appendix"  . PAP SMEAR  2012 AND 2017     Current Outpatient Medications  Medication Sig Dispense Refill  . albuterol (PROVENTIL HFA;VENTOLIN HFA) 108 (90 Base) MCG/ACT inhaler Inhale 2 puffs into the lungs every 4 (four) hours as needed for wheezing or shortness of breath.    . allopurinol (ZYLOPRIM) 100 MG tablet Take 100 mg by mouth daily.    Marland Kitchen amLODipine (NORVASC) 5  MG tablet Take 5 mg by mouth every morning.    Marland Kitchen atorvastatin (LIPITOR) 40 MG tablet Take 1 tablet (40 mg total) by mouth at bedtime. 90 tablet 3  . bisoprolol (ZEBETA) 5 MG tablet Take 1 tablet (5 mg total) by mouth daily. 90 tablet 1  . Cyanocobalamin (VITAMIN B-12 IJ) Inject 1 mL as directed every 30 (thirty) days.     Marland Kitchen ELIQUIS 5 MG TABS tablet TAKE 1 TABLET(5 MG)  BY MOUTH TWICE DAILY 180 tablet 1  . escitalopram (LEXAPRO) 10 MG tablet Take 10 mg by mouth as needed.    . ferrous sulfate 325 (65 FE) MG tablet Take 1 tablet (325 mg total) by mouth daily with breakfast. 30 tablet 3  . folic acid (FOLVITE) 1 MG tablet Take 1 tablet (1 mg total) by mouth daily. 30 tablet 0  . HYDROcodone-acetaminophen (NORCO/VICODIN) 5-325 MG tablet Take 1 tablet by mouth every 6 (six) hours as needed for moderate pain.    Marland Kitchen insulin NPH-regular Human (70-30) 100 UNIT/ML injection Inject 6 Units into the skin 2 (two) times daily with a meal. Inject 30 units into the skin in the morning and 70 units into the skin in the evening 10 mL 11  . Insulin Syringe-Needle U-100 31G X 5/16" 1 ML MISC by Does not apply route.    . loratadine (CLARITIN) 10 MG tablet Take 10 mg by mouth daily.    . Omega-3 Fatty Acids (FISH OIL) 1000 MG CAPS Take 1 capsule by mouth daily.    . pantoprazole (PROTONIX) 40 MG tablet Take 40 mg by mouth daily.    Marland Kitchen SPIRIVA HANDIHALER 18 MCG inhalation capsule Place 18 mcg into inhaler and inhale daily.     Marland Kitchen torsemide (DEMADEX) 20 MG tablet '60mg'$  (3 tablets) in the morning and '20mg'$  (1 tablet) in the afternoon     No current facility-administered medications for this visit.    Allergies:   Nsaids and Penicillins    Social History:  The patient  reports that she has been smoking cigarettes. She has a 20.00 pack-year smoking history. She has never used smokeless tobacco. She reports current alcohol use. She reports that she does not use drugs.   Family History:  The patient's family history includes  Cancer in her mother; Colon polyps in her brother, brother, brother, brother, brother, and brother; Diabetes in her father; Heart attack in her father; Hypertension in her mother; Macular degeneration in her mother.    ROS:  Please see the history of present illness.    Physical Exam: Blood pressure (!) 126/56, pulse 94, height '5\' 5"'$  (1.651 m), weight 235 lb 6.4 oz (106.8 kg), SpO2 96 %.  GEN:   Middle age, moderately obese female  HEENT: Normal NECK: No JVD; No carotid bruits LYMPHATICS: No lymphadenopathy CARDIAC: irreg. Irreg. , no murmurs, rubs, gallops RESPIRATORY:  Clear to auscultation without rales, wheezing or rhonchi  ABDOMEN: Soft, non-tender, non-distended MUSCULOSKELETAL:  No edema; No deformity  SKIN: Warm and dry NEUROLOGIC:  Alert and oriented x 3    EKG:   July 25, 2020:   Afib with HR of 94.   No St or T wave changes    Recent Labs: 11/23/2019: ALT 7 11/29/2019: BUN 28; Creatinine, Ser 2.60; Hemoglobin 8.8; Magnesium 1.8; Platelets 138; Potassium 4.2; Sodium 136    Lipid Panel    Component Value Date/Time   CHOL 111 05/11/2019 0837   TRIG 232 (H) 05/11/2019 0837   HDL 29 (L) 05/11/2019 0837   CHOLHDL 3.8 05/11/2019 0837   CHOLHDL 4.1 01/30/2018 0843   VLDL 34 01/30/2018 0843   LDLCALC 45 05/11/2019 0837      Wt Readings from Last 3 Encounters:  07/25/20 235 lb 6.4 oz (106.8 kg)  03/07/20 236 lb 6.4 oz (107.2 kg)  11/26/19 (!) 215 lb 2.7 oz (97.6 kg)      Other studies Reviewed: Additional studies/ records that were reviewed today include: . Review  of the above records demonstrates:    ASSESSMENT AND PLAN:  1.   Atrial fib : She has persistent atrial fibrillation.  Continue Eliquis.  Heart rate is well controlled.   2.   acute on chronic diastolic congestive heart failure:   Breathing seems to be fairly well controlled.  I have once again advised her to stop smoking completely.     2. Essential hypertension:   blood pressure is well  controlled.   3. Claudication:      Sees Dr. Carlis Abbott at [facility]    4. Hyperlipidemia:  Lipids look good       5.  Chronic kidney disease:    - Current medicines are reviewed at length with the patient today.  The patient does not have concerns regarding medicines.  Labs/ tests ordered today include:   No orders of the defined types were placed in this encounter.    Mertie Moores, MD  07/25/2020 8:31 AM    [facility] [address], [place], [place]  [address] Phone: [phone]; Fax: [phone]

## 2020-07-25 ENCOUNTER — Encounter: Payer: Self-pay | Admitting: Cardiovascular Disease

## 2020-07-25 ENCOUNTER — Ambulatory Visit: Payer: BC Managed Care – PPO | Admitting: Cardiovascular Disease

## 2020-07-25 ENCOUNTER — Other Ambulatory Visit: Payer: Self-pay

## 2020-07-25 VITALS — BP 126/56 | HR 94 | Ht 65.0 in | Wt 235.4 lb

## 2020-07-25 DIAGNOSIS — I5032 Chronic diastolic (congestive) heart failure: Secondary | ICD-10-CM | POA: Diagnosis not present

## 2020-07-25 DIAGNOSIS — I4819 Other persistent atrial fibrillation: Secondary | ICD-10-CM

## 2020-07-25 DIAGNOSIS — Z720 Tobacco use: Secondary | ICD-10-CM | POA: Diagnosis not present

## 2020-07-25 DIAGNOSIS — I10 Essential (primary) hypertension: Secondary | ICD-10-CM | POA: Diagnosis not present

## 2020-07-25 NOTE — Patient Instructions (Signed)

## 2020-09-14 ENCOUNTER — Other Ambulatory Visit: Payer: Self-pay | Admitting: Cardiovascular Disease

## 2020-09-14 NOTE — Telephone Encounter (Signed)
Pt last saw Dr Acie Fredrickson 07/25/20, last labs Creat 2.60, age 70, weight 106.8kg, based on specified criteria pt is on appropriate dosage of Eliquis '5mg'$  BID.  Will refill rx.

## 2020-10-12 ENCOUNTER — Other Ambulatory Visit: Payer: Self-pay | Admitting: Cardiovascular Disease

## 2020-10-20 ENCOUNTER — Other Ambulatory Visit: Payer: Self-pay | Admitting: *Deleted

## 2020-10-20 MED ORDER — BISOPROLOL FUMARATE 5 MG PO TABS: 5.0000 mg | ORAL_TABLET | Freq: Every day | ORAL | 2 refills | Status: DC

## 2021-01-29 ENCOUNTER — Other Ambulatory Visit: Payer: Self-pay

## 2021-01-29 MED ORDER — ATORVASTATIN CALCIUM 40 MG PO TABS: 40.0000 mg | ORAL_TABLET | Freq: Every day | ORAL | 1 refills | Status: DC

## 2021-03-08 ENCOUNTER — Other Ambulatory Visit: Payer: Self-pay | Admitting: *Deleted

## 2021-03-08 MED ORDER — APIXABAN 5 MG PO TABS: ORAL_TABLET | ORAL | 1 refills | Status: DC

## 2021-03-08 NOTE — Telephone Encounter (Signed)
Eliquis 5mg  paper refill request received. Patient is 70 years old, weight-106.8kg, Crea-2.39 on 08/18/2020 via [facility] from [facility], and last seen by Dr. Acie Fredrickson on 07/25/2020. Dose is appropriate based on dosing criteria. Will send in refill to requested pharmacy.

## 2021-07-30 ENCOUNTER — Encounter: Payer: Self-pay | Admitting: Cardiovascular Disease

## 2021-07-30 NOTE — Progress Notes (Signed)
? ?Cardiology Office Note ? ? ?Date:  07/31/2021  ? ?ID:  Paula Wheeler, DOB 1950-09-25, MRN [id number] ? ?PCP:  Hulan Fess, MD  ?Cardiologist:   Mertie Moores, MD  ? ?Chief Complaint  ?Patient presents with  ? Atrial Fibrillation  ? Hypertension  ? ?Problem list ?1. Essential HTN  ?2. Hyperlipidemia ?3. Cardiomegaly on x-ray ?4. Diabetes mellitus ?5. Obesity  ?6. Atrial fib -CHADS2VASC score is 15 ( female, age 71, HTN, PVD) ? ? ?Nov. 13, 2017: ?Paula Wheeler is a 71 y.o. female who presents for evaluation of cardiomegaly ?She requested a CXR to look for lung issues ( is a current smoker) was found have cardiac megaly. ? ?Her main complaint is that her legs give out when she is walking. ?She does not get any regular exercise. ? ?No CP.   Some shortness of breath with exertion  ?Has DM,   She is getting better control of her glucose levels recently .  ? ? ?She admits that she eats more salt that she probably should. ?She's canned soup was on occasions. She put salt on her apples. ? ?Feb. 12, 2018: ? ?Paula Wheeler was seen by Dr. Fletcher Anon for PVD. ?Was found to have atrial fib.   CHADS2VASC score is 7 ( female, age 13, HTN, PVD)  Started on Eliquis. ?Developed a GI bleed.  EGD shows erosive gastropathy.   ?She was told to stop her Goodies powders and Indomethicin ( which she was taking for gout )  ?She has seen Dr. Oletta Lamas -  ?She decided to reduce her Eliquis to 5 mg a day .  ? ? ?July 25, 2016: ? ?Paula Wheeler is seen back for follow Up of her atrial fibrillation. She was started on L course but she developed a GI bleed. She had been taking lots of goodies powders and indomethacin for gout. ? ?July 25, 2016: ? ?No symptoms related to atrial fib. ?Still smoking.  ?Her hands are cold at night  ?BP is up today .  Her Quinipril was reduced to once a day when she was in the hospital in jan. 2018.  ? ?October 28, 2016: ? ?Was cardioverted on April 11 ?Unfortunately, is back in atrial fib ?She cannot tell that her HR is irreg.  ?Remains on  Eliquis  ? ?Echo from 12. /17 showed normal Lv systolic function  ? ?Still smoking  ? ?2017-04-08: ? ?Mother died recently . ?No CP or dyspnea  ?Hx of atria fib  ? ?Sep 17, 2017: ? ?Paula Wheeler is seen for follow up of atrial fib  ?Is having difficulty breathing and her legs are swollen  ?Dyspnea for the past 3 weeks.  Not associated with any CP ?Has cut back on her smoking  ?Wt today is 226.  ( up 15 lbs since Dec. 2018)  ?Has lack of energy . ?Seems to feel better when she is on prednisone  ? ?She is in persistent atrial fibrillation.  We cardioverted her about a year ago but it only lasted for several weeks.  We discussed going to the A. fib clinic for consideration of Tikosyn therapy. ? ?January 05, 2018: ?Seen today for follow-up of her atrial fibrillation, hypertension hyperlipidemia. ?Was started on Pletal - has not started it until she cleared it with me  ?Still smoking  ?Previously, we had her on losartan but it affected her renal function.  We also had her on some amlodipine but this caused significant leg edema.  She has stopped both  of these medications and her blood pressure remains well controlled. ? ?March 04, 2018: ?Paula Wheeler is seen today for follow-up of her atrial fibrillation.  She also has hypertension and hyperlipidemia.  She has been anemic. ?Hemoglobin was 5.8.  She received 3 units packed red blood cells.  She has chronic kidney disease stage IV. ?She had not noticed any blood in her stool or black tarry stool  ? ?Is scheduled for EGD and colonoscopy Nov. 14,  ?Our  ? ? ?Jan. 12, 2021 ?Paula Wheeler is seen today for follow up of her atrial fib,  HTN, hyperlipidemia. ?Still smoking .   ?Is in Afib  ? ?March 07, 2020: ? ?Is seen today for follow-up of her atrial fibrillation, hypertension, hyperlipidemia.  She has normal left ventricular systolic function by echocardiogram in July, 2021.  We were not able to determine diastolic function.  There were no significant valve  problems. ? ? ?Was in the hospital in July with respiratory failure and metabolic acidosis, acute kidney injury ,  ? ?Has edema in her legs .  ?Has stopped smoking ?Has been to nephrology ( [facility])  ? ?Is taking torsemide 20 mg PO BID ( not daily as listed in the chart )  ? ?July 25, 2020: ?Paula Wheeler is seen for follow up of her chronic diastolic CHF, atrial fib, HTN, HLD ?Has lots of claudiation issues ?Sees Dr. Carlis Abbott at [facility] .  ?Wt is 235 lbs.  ? ?She is on atorvastatin 80 mg a day.  Her lipid levels from her primary medical doctor look good. ?Total cholesterol is 87 ?HDL is 28 ?LDL is 38 ?Triglyceride level is 111. ? ? ?July 31, 2021: ?Paula Wheeler is seen for follow up of her chronic diastolic chf, atrial fib, HTN, HLD  ?Has lots of claudication issues ?Sees dr. Carlis Abbott at [facility]  ? ?Complains of swelling in her legs  ?Has started drinking 2 diet sodas a day  ?Stays away from processed meats  ?Left leg is much more swollen than the right ?Also CKD ?Still smokes several cigarettes a day  ? ?Past Medical History:  ?Diagnosis Date  ? Anemia   ? COPD (chronic obstructive pulmonary disease) ([place])   ? Dyslipidemia   ? Exogenous obesity   ? GERD (gastroesophageal reflux disease)   ? GI bleed 05/14/2016  ? History of abnormal cells from cervix   ? Hyperlipidemia   ? Hypertension   ? Iron deficiency anemia   ? Longstanding persistent atrial fibrillation ([place])   ? Microalbuminuria   ? Tobacco dependence   ? Type II diabetes mellitus ([place])   ? ? ?Past Surgical History:  ?Procedure Laterality Date  ? CARDIOVERSION N/A 08/07/2016  ? Procedure: CARDIOVERSION;  Surgeon: Thayer Headings, MD;  Location: [facility];  Service: Cardiovascular;  Laterality: N/A;  ? DIAGNOSTIC MAMMOGRAM  05/2015  ? ESOPHAGOGASTRODUODENOSCOPY N/A 05/15/2016  ? Procedure: ESOPHAGOGASTRODUODENOSCOPY (EGD);  Surgeon: Wonda Horner, MD;  Location: [facility] ENDOSCOPY;  Service: Endoscopy;  Laterality: N/A;  ? [place]  ? "I think they left my  appendix"  ? PAP SMEAR  2012 AND 2017  ? ? ? ?Current Outpatient Medications  ?Medication Sig Dispense Refill  ? albuterol (PROVENTIL HFA;VENTOLIN HFA) 108 (90 Base) MCG/ACT inhaler Inhale 2 puffs into the lungs every 4 (four) hours as needed for wheezing or shortness of breath.    ? allopurinol (ZYLOPRIM) 100 MG tablet Take 100 mg by mouth daily.    ? amLODipine (NORVASC) 5 MG tablet Take 5  mg by mouth every morning.    ? apixaban (ELIQUIS) 5 MG TABS tablet TAKE 1 TABLET(5 MG) BY MOUTH TWICE DAILY 180 tablet 1  ? atorvastatin (LIPITOR) 40 MG tablet Take 1 tablet (40 mg total) by mouth at bedtime. 90 tablet 1  ? bisoprolol (ZEBETA) 5 MG tablet Take 1 tablet (5 mg total) by mouth daily. 90 tablet 2  ? Cyanocobalamin (VITAMIN B-12 IJ) Inject 1 mL as directed every 30 (thirty) days.     ? escitalopram (LEXAPRO) 10 MG tablet Take 10 mg by mouth as needed.    ? ferrous sulfate 325 (65 FE) MG tablet Take 1 tablet (325 mg total) by mouth daily with breakfast. 30 tablet 3  ? folic acid (FOLVITE) 1 MG tablet Take 1 tablet (1 mg total) by mouth daily. 30 tablet 0  ? HYDROcodone-acetaminophen (NORCO/VICODIN) 5-325 MG tablet Take 1 tablet by mouth every 6 (six) hours as needed for moderate pain.    ? insulin NPH-regular Human (70-30) 100 UNIT/ML injection Inject 6 Units into the skin 2 (two) times daily with a meal. Inject 30 units into the skin in the morning and 70 units into the skin in the evening 10 mL 11  ? Insulin Syringe-Needle U-100 31G X 5/16" 1 ML MISC by Does not apply route.    ? loratadine (CLARITIN) 10 MG tablet Take 10 mg by mouth daily.    ? Omega-3 Fatty Acids (FISH OIL) 1000 MG CAPS Take 1 capsule by mouth daily.    ? pantoprazole (PROTONIX) 40 MG tablet Take 40 mg by mouth daily.    ? SPIRIVA HANDIHALER 18 MCG inhalation capsule Place 18 mcg into inhaler and inhale daily.     ? torsemide (DEMADEX) 20 MG tablet 60mg  (3 tablets) in the morning and 20mg  (1 tablet) in the afternoon    ? ?No current  facility-administered medications for this visit.  ? ? ?Allergies:   Nsaids, Penicillins, and Quinapril hcl  ? ? ?Social History:  The patient  reports that she has been smoking cigarettes. She has a 20.00 pack-year smoking history. S

## 2021-07-31 ENCOUNTER — Encounter: Payer: Self-pay | Admitting: Cardiovascular Disease

## 2021-07-31 ENCOUNTER — Ambulatory Visit: Payer: BC Managed Care – PPO | Admitting: Cardiovascular Disease

## 2021-07-31 VITALS — BP 138/64 | HR 91 | Ht 65.0 in | Wt 238.2 lb

## 2021-07-31 DIAGNOSIS — I4819 Other persistent atrial fibrillation: Secondary | ICD-10-CM

## 2021-07-31 DIAGNOSIS — I739 Peripheral vascular disease, unspecified: Secondary | ICD-10-CM | POA: Diagnosis not present

## 2021-07-31 DIAGNOSIS — I10 Essential (primary) hypertension: Secondary | ICD-10-CM | POA: Diagnosis not present

## 2021-07-31 DIAGNOSIS — I5032 Chronic diastolic (congestive) heart failure: Secondary | ICD-10-CM | POA: Diagnosis not present

## 2021-07-31 LAB — LIPID PANEL
Chol/HDL Ratio: 2.9 ratio (ref 0.0–4.4)
Cholesterol, Total: 93 mg/dL — ABNORMAL LOW (ref 100–199)
HDL: 32 mg/dL — ABNORMAL LOW (ref >39)
LDL Chol Calc (NIH): 40 mg/dL (ref 0–99)
Triglycerides: 112 mg/dL (ref 0–149)
VLDL Cholesterol Cal: 21 mg/dL (ref 5–40)

## 2021-07-31 LAB — BASIC METABOLIC PANEL
BUN/Creatinine Ratio: 11 — ABNORMAL LOW (ref 12–28)
BUN: 27 mg/dL (ref 8–27)
CO2: 26 mmol/L (ref 20–29)
Calcium: 9.3 mg/dL (ref 8.7–10.3)
Chloride: 102 mmol/L (ref 96–106)
Creatinine, Ser: 2.36 mg/dL — ABNORMAL HIGH (ref 0.57–1.00)
Glucose: 182 mg/dL — ABNORMAL HIGH (ref 70–99)
Potassium: 4.6 mmol/L (ref 3.5–5.2)
Sodium: 143 mmol/L (ref 134–144)
eGFR: 22 mL/min/{1.73_m2} — ABNORMAL LOW (ref >59)

## 2021-07-31 LAB — ALT: ALT: 6 IU/L (ref 0–32)

## 2021-07-31 NOTE — Patient Instructions (Signed)
Medication Instructions:  ?Your physician recommends that you continue on your current medications as directed. Please refer to the Current Medication list given to you today. ? ?*If you need a refill on your cardiac medications before your next appointment, please call your pharmacy* ? ?Lab Work: ?TODAY: Lipids, ALT, BMP ?If you have labs (blood work) drawn today and your tests are completely normal, you will receive your results only by: ?MyChart Message (if you have MyChart) OR ?A paper copy in the mail ?If you have any lab test that is abnormal or we need to change your treatment, we will call you to review the results. ? ?Testing/Procedures: ?Your physician has requested that you have a lower extremity venous duplex. This test is an ultrasound of the veins in the legs or arms. It looks at venous blood flow that carries blood from the heart to the legs or arms. Allow one hour for a Lower Venous exam. Allow thirty minutes for an Upper Venous exam. There are no restrictions or special instructions. ?  ?Your physician has requested that you have an echocardiogram. Echocardiography is a painless test that uses sound waves to create images of your heart. It provides your doctor with information about the size and shape of your heart and how well your heart?s chambers and valves are working. This procedure takes approximately one hour. There are no restrictions for this procedure. ? ? ?Follow-Up: ?At [facility], you and your health needs are our priority.  As part of our continuing mission to provide you with exceptional heart care, we have created designated Provider Care Teams.  These Care Teams include your primary Cardiologist (physician) and Advanced Practice Providers (APPs -  Physician Assistants and Nurse Practitioners) who all work together to provide you with the care you need, when you need it. ? ?Your next appointment:   ?1 year(s) ? ?The format for your next appointment:   ?In Person ? ?Provider:    ?Mertie Moores, MD  or Robbie Lis, PA-C or Richardson Dopp, [place]  ?

## 2021-08-03 ENCOUNTER — Other Ambulatory Visit: Payer: Self-pay | Admitting: Cardiovascular Disease

## 2021-08-03 ENCOUNTER — Ambulatory Visit (HOSPITAL_COMMUNITY)
Admission: RE | Admit: 2021-08-03 | Discharge: 2021-08-03 | Disposition: A | Discharge status: Home / Self Care | Payer: BC Managed Care – PPO | Source: Ambulatory Visit | Attending: Cardiology | Admitting: Cardiology

## 2021-08-03 DIAGNOSIS — M7989 Other specified soft tissue disorders: Secondary | ICD-10-CM | POA: Insufficient documentation

## 2021-08-03 DIAGNOSIS — I739 Peripheral vascular disease, unspecified: Secondary | ICD-10-CM | POA: Diagnosis present

## 2021-08-03 DIAGNOSIS — M79669 Pain in unspecified lower leg: Secondary | ICD-10-CM | POA: Insufficient documentation

## 2021-08-03 DIAGNOSIS — M79662 Pain in left lower leg: Secondary | ICD-10-CM | POA: Diagnosis not present

## 2021-08-06 ENCOUNTER — Other Ambulatory Visit: Payer: Self-pay | Admitting: *Deleted

## 2021-08-06 MED ORDER — ATORVASTATIN CALCIUM 40 MG PO TABS: 40.0000 mg | ORAL_TABLET | Freq: Every day | ORAL | 3 refills | Status: DC

## 2021-08-10 ENCOUNTER — Ambulatory Visit (HOSPITAL_COMMUNITY): Payer: BC Managed Care – PPO | Attending: Internal Medicine

## 2021-08-10 DIAGNOSIS — I4819 Other persistent atrial fibrillation: Secondary | ICD-10-CM | POA: Insufficient documentation

## 2021-08-10 DIAGNOSIS — I5032 Chronic diastolic (congestive) heart failure: Secondary | ICD-10-CM | POA: Diagnosis not present

## 2021-08-10 LAB — ECHOCARDIOGRAM COMPLETE
Area-P 1/2: 4.67 cm2
S' Lateral: 4.4 cm

## 2021-08-23 ENCOUNTER — Other Ambulatory Visit: Payer: Self-pay | Admitting: Cardiovascular Disease

## 2021-08-23 DIAGNOSIS — I4819 Other persistent atrial fibrillation: Secondary | ICD-10-CM

## 2021-08-23 NOTE — Telephone Encounter (Signed)
Eliquis 5mg  refill request received. Patient is 72 years old, weight-108kg, Crea-2.36 on 07/31/2021, Diagnosis-Afib, and last seen by Dr. Acie Fredrickson on 07/31/2021. Dose is appropriate based on dosing criteria. Will send in refill to requested pharmacy.   ?

## 2021-08-29 ENCOUNTER — Other Ambulatory Visit: Payer: Self-pay | Admitting: Orthopedic Surgery

## 2021-08-29 DIAGNOSIS — R2242 Localized swelling, mass and lump, left lower limb: Secondary | ICD-10-CM

## 2021-09-25 ENCOUNTER — Ambulatory Visit
Admission: RE | Admit: 2021-09-25 | Discharge: 2021-09-25 | Disposition: A | Discharge status: Home / Self Care | Payer: BC Managed Care – PPO | Source: Ambulatory Visit | Attending: Orthopedic Surgery | Admitting: Orthopedic Surgery

## 2021-09-25 ENCOUNTER — Other Ambulatory Visit: Payer: Self-pay | Admitting: Orthopedic Surgery

## 2021-09-25 DIAGNOSIS — R2242 Localized swelling, mass and lump, left lower limb: Secondary | ICD-10-CM

## 2022-01-02 ENCOUNTER — Other Ambulatory Visit: Payer: Self-pay | Admitting: *Deleted

## 2022-01-02 MED ORDER — BISOPROLOL FUMARATE 5 MG PO TABS: 5.0000 mg | ORAL_TABLET | Freq: Every day | ORAL | 1 refills | Status: DC

## 2022-02-07 ENCOUNTER — Other Ambulatory Visit: Payer: Self-pay | Admitting: Cardiovascular Disease

## 2022-02-07 DIAGNOSIS — I4819 Other persistent atrial fibrillation: Secondary | ICD-10-CM

## 2022-02-07 NOTE — Telephone Encounter (Signed)
Eliquis 5mg  refill request received. Patient is 71 years old, weight-108kg, Crea-2.36 on 07/31/2021, Diagnosis-Afib, and last seen by Dr. Acie Fredrickson on 07/31/2021. Dose is appropriate based on dosing criteria. Will send in refill to requested pharmacy.

## 2022-05-27 ENCOUNTER — Ambulatory Visit: Payer: BC Managed Care – PPO | Admitting: Podiatry

## 2022-05-27 ENCOUNTER — Encounter: Payer: Self-pay | Admitting: Podiatry

## 2022-05-27 VITALS — BP 164/65 | HR 131

## 2022-05-27 DIAGNOSIS — B351 Tinea unguium: Secondary | ICD-10-CM | POA: Diagnosis not present

## 2022-05-27 DIAGNOSIS — M79675 Pain in left toe(s): Secondary | ICD-10-CM

## 2022-05-27 DIAGNOSIS — M79674 Pain in right toe(s): Secondary | ICD-10-CM | POA: Diagnosis not present

## 2022-05-27 DIAGNOSIS — E118 Type 2 diabetes mellitus with unspecified complications: Secondary | ICD-10-CM | POA: Diagnosis not present

## 2022-05-27 NOTE — Progress Notes (Addendum)
This patient presents to the office with chief complaint of long thick nails and diabetic feet.  This patient  says there  is  no pain and discomfort in her feet.  This patient says there are long thick painful nails.  These nails are painful walking and wearing shoes.  Patient has no history of infection or drainage from both feet.  Patient is unable to  self treat her own nails . This patient presents  to the office today for treatment of the  long nails and a foot evaluation due to history of  diabetes. She also has CKD and coagulation defect  General Appearance  Alert, conversant and in no acute stress.  Vascular  Dorsalis pedis and posterior tibial  pulses are weak/absent   bilaterally.  Capillary return is within normal limits  bilaterally. Temperature is within normal limits  bilaterally.  Neurologic  Senn-Weinstein monofilament wire test diminished bilaterally. Muscle power within normal limits bilaterally.  Nails Thick disfigured discolored nails with subungual debris  from hallux to fifth toes bilaterally. No evidence of bacterial infection or drainage bilaterally.  Orthopedic  No limitations of motion of motion feet .  No crepitus or effusions noted.  No bony pathology or digital deformities noted.  Skin  normotropic skin with no porokeratosis noted bilaterally.  No signs of infections or ulcers noted.     Onychomycosis  Diabetes with no foot complications  IE  Debride nails x 10.  A diabetic foot exam was performed and there is  evidence of any vascular or neurologic pathology.   RTC 3 months.   Gardiner Barefoot DPM

## 2022-06-30 ENCOUNTER — Other Ambulatory Visit: Payer: Self-pay | Admitting: Cardiovascular Disease

## 2022-06-30 DIAGNOSIS — I4819 Other persistent atrial fibrillation: Secondary | ICD-10-CM

## 2022-07-01 NOTE — Telephone Encounter (Signed)
Prescription refill request for Eliquis received. Indication: afib  Last office visit: [place], 07/31/2021 Scr: 2.36, 07/31/2021 Age: 72 yo  Weight: 108 kg   Refill sent.

## 2022-07-02 ENCOUNTER — Other Ambulatory Visit: Payer: Self-pay | Admitting: Cardiovascular Disease

## 2022-07-15 ENCOUNTER — Ambulatory Visit (HOSPITAL_COMMUNITY)
Admission: RE | Admit: 2022-07-15 | Discharge: 2022-07-15 | Disposition: A | Discharge status: Home / Self Care | Payer: BC Managed Care – PPO | Source: Ambulatory Visit | Attending: Surgery | Admitting: Surgery

## 2022-07-15 ENCOUNTER — Other Ambulatory Visit (HOSPITAL_COMMUNITY): Payer: Self-pay | Admitting: Family Medicine

## 2022-07-15 ENCOUNTER — Other Ambulatory Visit (HOSPITAL_COMMUNITY): Payer: Self-pay | Admitting: Surgery

## 2022-07-15 DIAGNOSIS — H3582 Retinal ischemia: Secondary | ICD-10-CM | POA: Diagnosis not present

## 2022-07-28 ENCOUNTER — Encounter: Payer: Self-pay | Admitting: Cardiovascular Disease

## 2022-07-28 NOTE — Progress Notes (Unsigned)
Cardiology Office Note   Date:  07/29/2022   ID:  Paula Wheeler, DOB [birth year], MRN [id number]  PCP:  Lawerance Cruel, MD  Cardiologist:   Mertie Moores, MD   Chief Complaint  Patient presents with   Atrial Fibrillation        Hypertension        Hyperlipidemia   Problem list 1. Essential HTN  2. Hyperlipidemia 3. Cardiomegaly on x-ray 4. Diabetes mellitus 5. Obesity  6. Atrial fib -CHADS2VASC score is 85 ( female, age 72, HTN, PVD)   Nov. 13, 2017: Paula Wheeler is a 72 y.o. female who presents for evaluation of cardiomegaly She requested a CXR to look for lung issues ( is a current smoker) was found have cardiac megaly.  Her main complaint is that her legs give out when she is walking. She does not get any regular exercise.  No CP.   Some shortness of breath with exertion  Has DM,   She is getting better control of her glucose levels recently .    She admits that she eats more salt that she probably should. She's canned soup was on occasions. She put salt on her apples.  Feb. 12, 2018:  Pt was seen by Dr. Fletcher Anon for PVD. Was found to have atrial fib.   CHADS2VASC score is 24 ( female, age 70, HTN, PVD)  Started on Eliquis. Developed a GI bleed.  EGD shows erosive gastropathy.   She was told to stop her Goodies powders and Indomethicin ( which she was taking for gout )  She has seen Dr. Oletta Lamas -  She decided to reduce her Eliquis to 5 mg a day .    July 25, 2016:  Pt is seen back for follow Up of her atrial fibrillation. She was started on L course but she developed a GI bleed. She had been taking lots of goodies powders and indomethacin for gout.  July 25, 2016:  No symptoms related to atrial fib. Still smoking.  Her hands are cold at night  BP is up today .  Her Quinipril was reduced to once a day when she was in the hospital in jan. 2018.   October 28, 2016:  Was cardioverted on April 11 Unfortunately, is back in atrial fib She cannot  tell that her HR is irreg.  Remains on Eliquis   Echo from 12. /17 showed normal Lv systolic function   Still smoking   04-04-17:  Mother died recently . No CP or dyspnea  Hx of atria fib   Sep 17, 2017:  Paula Wheeler is seen for follow up of atrial fib  Is having difficulty breathing and her legs are swollen  Dyspnea for the past 3 weeks.  Not associated with any CP Has cut back on her smoking  Wt today is 226.  ( up 15 lbs since Dec. 2018)  Has lack of energy . Seems to feel better when she is on prednisone   She is in persistent atrial fibrillation.  We cardioverted her about a year ago but it only lasted for several weeks.  We discussed going to the A. fib clinic for consideration of Tikosyn therapy.  January 05, 2018: Seen today for follow-up of her atrial fibrillation, hypertension hyperlipidemia. Was started on Pletal - has not started it until she cleared it with me  Still smoking  Previously, we had her on losartan but it affected her renal function.  We also had her  on some amlodipine but this caused significant leg edema.  She has stopped both of these medications and her blood pressure remains well controlled.  March 04, 2018: [name] is seen today for follow-up of her atrial fibrillation.  She also has hypertension and hyperlipidemia.  She has been anemic. Hemoglobin was 5.8.  She received 3 units packed red blood cells.  She has chronic kidney disease stage IV. She had not noticed any blood in her stool or black tarry stool   Is scheduled for EGD and colonoscopy Nov. 14,  Our    Jan. 12, 2021 Paula Wheeler is seen today for follow up of her atrial fib,  HTN, hyperlipidemia. Still smoking .   Is in Afib   March 07, 2020:  Is seen today for follow-up of her atrial fibrillation, hypertension, hyperlipidemia.  She has normal left ventricular systolic function by echocardiogram in July, 2021.  We were not able to determine diastolic function.  There  were no significant valve problems.   Was in the hospital in July with respiratory failure and metabolic acidosis, acute kidney injury ,   Has edema in her legs .  Has stopped smoking Has been to nephrology ( [facility])   Is taking torsemide 20 mg PO BID ( not daily as listed in the chart )   July 25, 2020: Paula Wheeler is seen for follow up of her chronic diastolic CHF, atrial fib, HTN, HLD Has lots of claudiation issues Sees Dr. Carlis Abbott at [facility] .  Wt is 235 lbs.   She is on atorvastatin 80 mg a day.  Her lipid levels from her primary medical doctor look good. Total cholesterol is 87 HDL is 28 LDL is 38 Triglyceride level is 111.   July 31, 2021: Paula Wheeler is seen for follow up of her chronic diastolic chf, atrial fib, HTN, HLD  Has lots of claudication issues Sees dr. Carlis Abbott at [facility]   Complains of swelling in her legs  Has started drinking 2 diet sodas a day  Stays away from processed meats  Left leg is much more swollen than the right Also CKD Still smokes several cigarettes a day    July 29, 2022 Paula Wheeler is seen for follow up of her chronic diastolic CHF, atrial fib, HTN, HLD, CKD Has claudication Sees Dr. Carlis Abbott at [facility]  Has bilateral ICA disease  Is seeing Dr. Carlis Abbott on 08/06/22  She is not entirely sure that the if she wants to proceed with carotid endarterectomy.  Will go ahead and order a Chapman study.  If the Myoview study does not show severe anterior ischemia the then I think that she should be able to make it through her surgery without significant cardiovascular risk.  She does have significant COPD and I have suggested that she see a pulmonologist to tune her lungs up prior to carotid endarterectomy.  If she does not have carotid endarterectomy she inquired as to how she could best reduce her risk of stroke.  We discussed smoking cessation and further lowering of her cholesterol is being options.  I would probably also suggest adding Plavix to her Eliquis  dose to reduce risk of stroke.           Past Medical History:  Diagnosis Date   Anemia    COPD (chronic obstructive pulmonary disease)    Dyslipidemia    Exogenous obesity    GERD (gastroesophageal reflux disease)    GI bleed 05/14/2016   History of abnormal cells from cervix  Hyperlipidemia    Hypertension    Iron deficiency anemia    Longstanding persistent atrial fibrillation    Microalbuminuria    Tobacco dependence    Type II diabetes mellitus     Past Surgical History:  Procedure Laterality Date   CARDIOVERSION N/A 08/07/2016   Procedure: CARDIOVERSION;  Surgeon: Thayer Headings, MD;  Location: [facility];  Service: Cardiovascular;  Laterality: N/A;   DIAGNOSTIC MAMMOGRAM  05/2015   ESOPHAGOGASTRODUODENOSCOPY N/A 05/15/2016   Procedure: ESOPHAGOGASTRODUODENOSCOPY (EGD);  Surgeon: Wonda Horner, MD;  Location[facility] ENDOSCOPY;  Service: Endoscopy;  Laterality: N/A;   EXPLORATORY LAPAROTOMY  1968   "I think they left my appendix"   PAP SMEAR  2012 AND 2017     Current Outpatient Medications  Medication Sig Dispense Refill   albuterol (PROVENTIL HFA;VENTOLIN HFA) 108 (90 Base) MCG/ACT inhaler Inhale 2 puffs into the lungs every 4 (four) hours as needed for wheezing or shortness of breath.     allopurinol (ZYLOPRIM) 100 MG tablet Take 100 mg by mouth daily.     amLODipine (NORVASC) 5 MG tablet Take 5 mg by mouth every morning.     atorvastatin (LIPITOR) 40 MG tablet Take 1 tablet (40 mg total) by mouth at bedtime. 90 tablet 3   bisoprolol (ZEBETA) 5 MG tablet TAKE 1 TABLET(5 MG) BY MOUTH DAILY 30 tablet 0   Cyanocobalamin (VITAMIN B-12 IJ) Inject 1 mL as directed every 30 (thirty) days.      escitalopram (LEXAPRO) 10 MG tablet Take 10 mg by mouth as needed.     ezetimibe (ZETIA) 10 MG tablet Take 1 tablet (10 mg total) by mouth daily. 90 tablet 3   ferrous sulfate 325 (65 FE) MG tablet Take 1 tablet (325 mg total) by mouth daily with breakfast. 30 tablet 3    folic acid (FOLVITE) 1 MG tablet Take 1 tablet (1 mg total) by mouth daily. 30 tablet 0   HYDROcodone-acetaminophen (NORCO/VICODIN) 5-325 MG tablet Take 1 tablet by mouth every 6 (six) hours as needed for moderate pain.     insulin NPH-regular Human (70-30) 100 UNIT/ML injection Inject 6 Units into the skin 2 (two) times daily with a meal. Inject 30 units into the skin in the morning and 70 units into the skin in the evening 10 mL 11   Insulin Syringe-Needle U-100 31G X 5/16" 1 ML MISC by Does not apply route.     loratadine (CLARITIN) 10 MG tablet Take 10 mg by mouth daily.     metolazone (ZAROXOLYN) 2.5 MG tablet Take 2.5 mg by mouth daily as needed.     Omega-3 Fatty Acids (FISH OIL) 1000 MG CAPS Take 1 capsule by mouth daily.     pantoprazole (PROTONIX) 40 MG tablet Take 40 mg by mouth daily.     SPIRIVA HANDIHALER 18 MCG inhalation capsule Place 18 mcg into inhaler and inhale daily.      tiZANidine (ZANAFLEX) 4 MG tablet Take 4 mg by mouth 3 (three) times daily.     torsemide (DEMADEX) 20 MG tablet 60mg  (3 tablets) in the morning and 20mg  (1 tablet) in the afternoon     apixaban (ELIQUIS) 5 MG TABS tablet TAKE 1 TABLET(5 MG) BY MOUTH TWICE DAILY 180 tablet 3   No current facility-administered medications for this visit.    Allergies:   Nsaids, Penicillins, and Quinapril hcl    Social History:  The patient  reports that she has been smoking cigarettes. She has a 20.00 pack-year  smoking history. She has never used smokeless tobacco. She reports current alcohol use. She reports that she does not use drugs.   Family History:  The patient's family history includes Cancer in her mother; Colon polyps in her brother, brother, brother, brother, brother, and brother; Diabetes in her father; Heart attack in her father; Hypertension in her mother; Macular degeneration in her mother.    ROS:  ROS is reviewed in HPI . Otherwise is negative   Physical Exam: Blood pressure 128/64, pulse 82, height  5\' 5"  (1.651 m), weight 214 lb 3.2 oz (97.2 kg), SpO2 97 %.       GEN:  Well nourished, well developed in no acute distress HEENT: Normal NECK: No JVD; left carotid bruit  LYMPHATICS: No lymphadenopathy CARDIAC: irreg. Irreg.  RESPIRATORY:  bilat. Wheezes, rhonchi  ABDOMEN: Soft, non-tender, non-distended MUSCULOSKELETAL:  No edema; No deformity  SKIN: Warm and dry NEUROLOGIC:  Alert and oriented x 3    EKG:    July 29, 2022: Atrial flutter with a ventricular response of 82 beats a minute.  Poor R wave progression-?  Lead placement.  Nonspecific ST and T wave changes.    Recent Labs: 07/31/2021: ALT 6; BUN 27; Creatinine, Ser 2.36; Potassium 4.6; Sodium 143    Lipid Panel    Component Value Date/Time   CHOL 93 (L) 07/31/2021 0837   TRIG 112 07/31/2021 0837   HDL 32 (L) 07/31/2021 0837   CHOLHDL 2.9 07/31/2021 0837   CHOLHDL 4.1 01/30/2018 0843   VLDL 34 01/30/2018 0843   LDLCALC 40 07/31/2021 0837      Wt Readings from Last 3 Encounters:  07/29/22 214 lb 3.2 oz (97.2 kg)  07/31/21 238 lb 3.2 oz (108 kg)  07/25/20 235 lb 6.4 oz (106.8 kg)      Other studies Reviewed: Additional studies/ records that were reviewed today include: . Review of the above records demonstrates:    ASSESSMENT AND PLAN:    Severe bilateral carotid stenosis:    will get a Lexiscan myoview prior to making a decision She will be able to hold her eliquis for 2-3 days prior to surgery  I think she needs to see pulmonary prior to her surgery   She is committed to stopping smoking    1.   Atrial fib :     is in flutter today  Stable    2.   acute on chronic diastolic congestive heart failure:   Echocardiogram from last year reveals normal left ventricular systolic function.     2. Essential hypertension:     3. Claudication:      Sees Dr. Carlis Abbott at [facility]    4. Hyperlipidemia: Her last LDL is 70.  In an effort to reduce her cholesterol even further, we will add Zetia 10 mg a day.   Will check lipids, ALT in 3 months.       5.  Chronic kidney disease:    - Baseline creatinine is around 2.36  She sees nephrology on a regular basis.    Current medicines are reviewed at length with the patient today.  The patient does not have concerns regarding medicines.  Labs/ tests ordered today include:   Orders Placed This Encounter  Procedures   ALT   Lipid panel   Ambulatory referral to Pulmonology   Cardiac Stress Test: Informed Consent Details: Physician/Practitioner Attestation; Transcribe to consent form and obtain patient signature   MYOCARDIAL PERFUSION IMAGING   EKG 12-Lead  Mertie Moores, MD  07/29/2022 8:52 AM    [address], [place], [place]  [address] Phone: [phone]; Fax: [phone]

## 2022-07-29 ENCOUNTER — Encounter: Payer: Self-pay | Admitting: Cardiovascular Disease

## 2022-07-29 ENCOUNTER — Ambulatory Visit: Payer: BC Managed Care – PPO | Attending: Cardiovascular Disease | Admitting: Cardiovascular Disease

## 2022-07-29 ENCOUNTER — Ambulatory Visit: Payer: BC Managed Care – PPO | Admitting: Cardiovascular Disease

## 2022-07-29 VITALS — BP 128/64 | HR 82 | Ht 65.0 in | Wt 214.2 lb

## 2022-07-29 DIAGNOSIS — J449 Chronic obstructive pulmonary disease, unspecified: Secondary | ICD-10-CM | POA: Diagnosis not present

## 2022-07-29 DIAGNOSIS — I4819 Other persistent atrial fibrillation: Secondary | ICD-10-CM

## 2022-07-29 DIAGNOSIS — E782 Mixed hyperlipidemia: Secondary | ICD-10-CM | POA: Diagnosis not present

## 2022-07-29 DIAGNOSIS — I6523 Occlusion and stenosis of bilateral carotid arteries: Secondary | ICD-10-CM | POA: Diagnosis not present

## 2022-07-29 MED ORDER — EZETIMIBE 10 MG PO TABS: 10.0000 mg | ORAL_TABLET | Freq: Every day | ORAL | 3 refills | Status: DC

## 2022-07-29 MED ORDER — APIXABAN 5 MG PO TABS: ORAL_TABLET | ORAL | 3 refills | Status: DC

## 2022-07-29 NOTE — Patient Instructions (Signed)
Medication Instructions:  START Zetia/Ezetimibe 10mg  daily *If you need a refill on your cardiac medications before your next appointment, please call your pharmacy*  Lab Work: Lipids, ALT in 3 months  If you have labs (blood work) drawn today and your tests are completely normal, you will receive your results only by: [place] (if you have MyChart) OR A paper copy in the mail If you have any lab test that is abnormal or we need to change your treatment, we will call you to review the results.  Testing/Procedures: Ambulatory referral to [place] Your physician has requested that you have a lexiscan myoview. For further information please visit [email or web address]. Please follow instruction sheet, as given.  Follow-Up: At [facility], you and your health needs are our priority.  As part of our continuing mission to provide you with exceptional heart care, we have created designated Provider Care Teams.  These Care Teams include your primary Cardiologist (physician) and Advanced Practice Providers (APPs -  Physician Assistants and Nurse Practitioners) who all work together to provide you with the care you need, when you need it.  Your next appointment:   6 month(s)  Provider:   Mertie Moores, MD      Other Instructions Stress test instructions included on separate sheet

## 2022-07-30 ENCOUNTER — Other Ambulatory Visit: Payer: Self-pay | Admitting: Cardiovascular Disease

## 2022-08-01 ENCOUNTER — Telehealth (HOSPITAL_COMMUNITY): Payer: Self-pay | Admitting: *Deleted

## 2022-08-01 NOTE — Telephone Encounter (Signed)
Spoke with patient and gave instructions for MPI study scheduled on 08/08/22.

## 2022-08-06 ENCOUNTER — Encounter: Payer: Self-pay | Admitting: Vascular Surgery

## 2022-08-06 ENCOUNTER — Ambulatory Visit: Payer: BC Managed Care – PPO | Admitting: Vascular Surgery

## 2022-08-06 VITALS — BP 151/77 | HR 78 | Temp 98.2°F | Resp 20 | Ht 65.0 in | Wt 213.4 lb

## 2022-08-06 DIAGNOSIS — I6523 Occlusion and stenosis of bilateral carotid arteries: Secondary | ICD-10-CM

## 2022-08-06 DIAGNOSIS — I779 Disorder of arteries and arterioles, unspecified: Secondary | ICD-10-CM | POA: Insufficient documentation

## 2022-08-06 NOTE — Progress Notes (Signed)
Patient name: Paula Wheeler Birdwell MRN: [id number] DOB: Mar 26, 1951 Sex: female  REASON FOR CONSULT: Evaluate carotid artery disease  HPI: Paula Wheeler Franze is Wheeler 72 y.o. female, with hx COPD, HTN, HLD, DM, stage 4 CKD that presents for evaluation of carotid artery disease.  Patient states she was seeing her ophthalmologist and he noted some changes to her right eye.  She states she had no vision loss or other signs or symptoms of stroke prior to this.  Her understanding was that these changes were related to her diabetes.  Ultimately she underwent Wheeler carotid duplex showing high-grade carotid stenosis bilaterally with Wheeler high carotid bifurcation.  She also recently saw her cardiologist Dr. Elease HashimotoNahser who is recommending Wheeler stress test prior to any carotid intervention.  She has also been referred to pulmonology to evaluate her operative risk.  She denies any history of neck surgery or neck radiation.  States she is undecided about surgery and knows she is very high risk.  Past Medical History:  Diagnosis Date   Anemia    COPD (chronic obstructive pulmonary disease)    Dyslipidemia    Exogenous obesity    GERD (gastroesophageal reflux disease)    GI bleed 05/14/2016   History of abnormal cells from cervix    Hyperlipidemia    Hypertension    Iron deficiency anemia    Longstanding persistent atrial fibrillation    Microalbuminuria    Tobacco dependence    Type II diabetes mellitus     Past Surgical History:  Procedure Laterality Date   CARDIOVERSION N/Wheeler 08/07/2016   Procedure: CARDIOVERSION;  Surgeon: Vesta MixerPhilip J Nahser, MD;  [facility];  Service: Cardiovascular;  Laterality: N/Wheeler;   DIAGNOSTIC MAMMOGRAM  05/2015   ESOPHAGOGASTRODUODENOSCOPY N/Wheeler 05/15/2016   Procedure: ESOPHAGOGASTRODUODENOSCOPY (EGD);  Surgeon: Graylin ShiverSalem F Ganem, MD;  [facility];  Service: Endoscopy;  Laterality: N/Wheeler;   EXPLORATORY LAPAROTOMY  1968   "I think they left my appendix"   PAP SMEAR  2012 AND 2017     Family History  Problem Relation Age of Onset   Cancer Mother        BREAST   Hypertension Mother    Macular degeneration Mother    Heart attack Father    Diabetes Father    Colon polyps Brother    Colon polyps Brother    Colon polyps Brother    Colon polyps Brother    Colon polyps Brother    Colon polyps Brother     SOCIAL HISTORY: Social History   Socioeconomic History   Marital status: Single    Spouse name: Not on file   Number of children: Not on file   Years of education: Not on file   Highest education level: Not on file  Occupational History   Not on file  Tobacco Use   Smoking status: Every Day    Packs/day: 0.50    Years: 40.00    Additional pack years: 0.00    Total pack years: 20.00    Types: Cigarettes   Smokeless tobacco: Never  Vaping Use   Vaping Use: Never used  Substance and Sexual Activity   Alcohol use: Yes    Comment: 05/14/2016 "I'll have Wheeler drink once/year; New Year's"   Drug use: No   Sexual activity: Never  Other Topics Concern   Not on file  Social History Narrative   Not on file   Social Determinants of Health   Financial Resource Strain: Not on file  Food Insecurity: Not on file  Transportation Needs: Not on file  Physical Activity: Not on file  Stress: Not on file  Social Connections: Not on file  Intimate Partner Violence: Not on file    Allergies  Allergen Reactions   Nsaids Swelling    Hands and legs swell   Penicillins Hives    Has patient had Wheeler PCN reaction causing immediate rash, facial/tongue/throat swelling, SOB or lightheadedness with hypotension: Yes Has patient had Wheeler PCN reaction causing severe rash involving mucus membranes or skin necrosis: No Has patient had Wheeler PCN reaction that required hospitalization: No Has patient had Wheeler PCN reaction occurring within the last 10 years: No If all of the above answers are "NO", then may proceed with Cephalosporin use.    Quinapril Hcl     Other reaction(s): elevated  potassium and creatinine    Current Outpatient Medications  Medication Sig Dispense Refill   albuterol (PROVENTIL HFA;VENTOLIN HFA) 108 (90 Base) MCG/ACT inhaler Inhale 2 puffs into the lungs every 4 (four) hours as needed for wheezing or shortness of breath.     allopurinol (ZYLOPRIM) 100 MG tablet Take 100 mg by mouth daily.     amLODipine (NORVASC) 5 MG tablet Take 5 mg by mouth every morning.     apixaban (ELIQUIS) 5 MG TABS tablet TAKE 1 TABLET(5 MG) BY MOUTH TWICE DAILY 180 tablet 3   atorvastatin (LIPITOR) 40 MG tablet Take 1 tablet (40 mg total) by mouth at bedtime. 90 tablet 3   bisoprolol (ZEBETA) 5 MG tablet TAKE 1 TABLET(5 MG) BY MOUTH DAILY 90 tablet 3   Cyanocobalamin (VITAMIN B-12 IJ) Inject 1 mL as directed every 30 (thirty) days.      escitalopram (LEXAPRO) 10 MG tablet Take 10 mg by mouth as needed.     ezetimibe (ZETIA) 10 MG tablet Take 1 tablet (10 mg total) by mouth daily. 90 tablet 3   ferrous sulfate 325 (65 FE) MG tablet Take 1 tablet (325 mg total) by mouth daily with breakfast. 30 tablet 3   folic acid (FOLVITE) 1 MG tablet Take 1 tablet (1 mg total) by mouth daily. 30 tablet 0   HYDROcodone-acetaminophen (NORCO/VICODIN) 5-325 MG tablet Take 1 tablet by mouth every 6 (six) hours as needed for moderate pain.     insulin NPH-regular Human (70-30) 100 UNIT/ML injection Inject 6 Units into the skin 2 (two) times daily with Wheeler meal. Inject 30 units into the skin in the morning and 70 units into the skin in the evening 10 mL 11   Insulin Syringe-Needle U-100 31G X 5/16" 1 ML MISC by Does not apply route.     loratadine (CLARITIN) 10 MG tablet Take 10 mg by mouth daily.     metolazone (ZAROXOLYN) 2.5 MG tablet Take 2.5 mg by mouth daily as needed.     Omega-3 Fatty Acids (FISH OIL) 1000 MG CAPS Take 1 capsule by mouth daily.     pantoprazole (PROTONIX) 40 MG tablet Take 40 mg by mouth daily.     SPIRIVA HANDIHALER 18 MCG inhalation capsule Place 18 mcg into inhaler and  inhale daily.      tiZANidine (ZANAFLEX) 4 MG tablet Take 4 mg by mouth 3 (three) times daily.     torsemide (DEMADEX) 20 MG tablet 60mg  (3 tablets) in the morning and 20mg  (1 tablet) in the afternoon     No current facility-administered medications for this visit.    REVIEW OF SYSTEMS:  [X]  denotes positive finding, [ ]   denotes negative finding Cardiac  Comments:  Chest pain or chest pressure:    Shortness of breath upon exertion:    Short of breath when lying flat:    Irregular heart rhythm:        Vascular    Pain in calf, thigh, or hip brought on by ambulation:    Pain in feet at night that wakes you up from your sleep:     Blood clot in your veins:    Leg swelling:         Pulmonary    Oxygen at home:    Productive cough:     Wheezing:         Neurologic    Sudden weakness in arms or legs:     Sudden numbness in arms or legs:     Sudden onset of difficulty speaking or slurred speech:    Temporary loss of vision in one eye:     Problems with dizziness:         Gastrointestinal    Blood in stool:     Vomited blood:         Genitourinary    Burning when urinating:     Blood in urine:        Psychiatric    Major depression:         Hematologic    Bleeding problems:    Problems with blood clotting too easily:        Skin    Rashes or ulcers:        Constitutional    Fever or chills:      PHYSICAL EXAM: There were no vitals filed for this visit.  GENERAL: The patient is Wheeler well-nourished female, in no acute distress. The vital signs are documented above. CARDIAC: There is Wheeler regular rate and rhythm.  PULMONARY: No respiratory distress. ABDOMEN: Soft and non-tender.  MUSCULOSKELETAL: There are no major deformities or cyanosis. NEUROLOGIC: No focal weakness or paresthesias are detected. SKIN: There are no ulcers or rashes noted. PSYCHIATRIC: The patient has Wheeler normal affect.  DATA:   Carotid Arterial Duplex Study  Patient Name:  Paula Hua  Date  of Exam:   07/15/2022 Medical Rec #: [id number]          Accession #:    [id number] Date of Birth: May 02, 1950          Patient Gender: F Patient Age:   73 years Exam Location:  [facility] Procedure:      VAS US CAROTID Referring Phys: Leonette Most ROSS   --------------------------------------------------------------------------- -----   Indications:                            Carotid artery disease, Visual                                         disturbance and Retinal ischemia. Risk Factors:                           Hypertension, hyperlipidemia, Diabetes,                                         current smoker, PAD. Other Factors:  CKD stage 4, CHF. Limitations                             Today's exam was limited due to heavy                                         calcification and the resulting                                         shadowing, the patient's respiratory                                         variation and the high bifurcation of                                         the carotid. Pre-Surgical Evaluation & Surgical      Stenosis at RICA only. Right Correlation                             bifurcation is located near the                                         mandible. Stenosis at left ICA only.                                         Left bifurcation is located near the                                         mandible.  Performing Technologist: Lowell Guitar RVT, RDMS    Examination Guidelines: Wheeler complete evaluation includes B-mode imaging, spectral Doppler, color Doppler, and power Doppler as needed of all accessible portions of each vessel. Bilateral testing is considered an integral part of Wheeler complete examination. Limited examinations for reoccurring indications may be performed as noted.    Right Carotid Findings: +----------+--------+--------+--------+------------------------------+----- ----+            PSV cm/sEDV cm/sStenosisPlaque Description             Comments  +----------+--------+--------+--------+------------------------------+----- ----+ CCA Prox  85      16                                                      +----------+--------+--------+--------+------------------------------+----- ----+ CCA Mid   84      9                                                       +----------+--------+--------+--------+------------------------------+----- ----+  CCA Distal73      12                                                      +----------+--------+--------+--------+------------------------------+----- ----+ ICA Prox  567     188     80-99%  diffuse, irregular,           Shadowing                                   heterogenous and calcific               +----------+--------+--------+--------+------------------------------+----- ----+ ICA Mid   122                                                    Shadowing +----------+--------+--------+--------+------------------------------+----- ----+ ICA Distal69      10                                             Shadowing +----------+--------+--------+--------+------------------------------+----- ----+ ECA       188     21                                                      +----------+--------+--------+--------+------------------------------+----- ----+  +----------+--------+-------+--------+-------------------+           PSV cm/sEDV cmsDescribeArm Pressure (mmHG) +----------+--------+-------+--------+-------------------+ Subclavian                       138                 +----------+--------+-------+--------+-------------------+  +---------+--------+--+--------+--+---------+ VertebralPSV cm/s91EDV cm/s27Antegrade +---------+--------+--+--------+--+---------+  Right thyroid complex heterogeneous irregular bordered nodule with peripheral and internal  vascularity. structure measures 3.9x2.2x2.1cm   Left Carotid Findings: +----------+--------+--------+--------+-------------------------------+---- ----+           PSV cm/sEDV cm/sStenosisPlaque Description             Comments +----------+--------+--------+--------+-------------------------------+---- ----+ CCA Prox  99      26                                                       +----------+--------+--------+--------+-------------------------------+---- ----+ CCA Mid   127     33                                                       +----------+--------+--------+--------+-------------------------------+---- ----+ CCA Distal118     35                                                       +----------+--------+--------+--------+-------------------------------+---- ----+  ICA Prox  541     192     80-99%  diffuse, irregular,                                                        heterogenous and calcific                +----------+--------+--------+--------+-------------------------------+---- ----+ ICA Mid   424     146     80-99%                                 tortuous +----------+--------+--------+--------+-------------------------------+---- ----+ ICA Distal296     70                                             tortuous +----------+--------+--------+--------+-------------------------------+---- ----+ ECA       148     18                                                       +----------+--------+--------+--------+-------------------------------+---- ----+  +----------+--------+--------+----------------+-------------------+           PSV cm/sEDV cm/sDescribe        Arm Pressure (mmHG) +----------+--------+--------+----------------+-------------------+ [id number]             Multiphasic, [id number]                  +----------+--------+--------+----------------+-------------------+  +---------+--------+--+--------+--+---------+ VertebralPSV cm/s97EDV cm/s23Antegrade +---------+--------+--+--------+--+---------+        Summary: Right Carotid: Velocities in the right ICA are consistent with Wheeler 80-99%                stenosis.  Left Carotid: Velocities in the left ICA are consistent with Wheeler 80-99% stenosis.  Vertebrals:  Bilateral vertebral arteries demonstrate antegrade flow. Subclavians: Normal flow hemodynamics were seen in the left subclavian artery.  *See table(s) above for measurements and observations.    Assessment/Plan:  72 y.o. female, with hx COPD, HTN, HLD, DM, stage 4 CKD presents for evaluation of carotid artery disease.  She denies any associated vision loss or other signs or symptoms of stroke.  She is neurologically intact today.  Her carotid duplex shows high-grade bilateral carotid stenosis greater than 80% in the proximal ICA.  I discussed we would normally recommend surgical intervention for stroke risk reduction for >80% stenosis in the setting of asymptomatic disease.  I discussed that ideally we would obtain Wheeler CTA neck to further evaluate her anatomy.  Particularly given the high carotid bifurcation bilaterally that was noted on duplex.  Also concerned that she has fairly extensive plaque into the mid to distal ICA on the left that could make this surgically difficult to access.  I discussed Wheeler CTA with contrast in the setting of CKD could risk her kidney function and she does not want to risk any further kidney damage with contrast exposure.  She is having Wheeler hard time deciding how to proceed.  I discussed her stroke risk is 11% over the next 5 years  reduced to 5% with surgery for asymptomatic carotid disease.  Ultimately we agreed on her proceeding with Wheeler stress test with cardiology and also seeing her pulmonologist to see what her overall risk factors are for surgery.  I  will get Wheeler CT neck without contrast to further evaluate her anatomy and also see if she potentially would be Wheeler TCAR candidate.  I discussed this would be Wheeler more limited image without contrast but we should be able to garner information to ensure she does not have too much calcification that would make stenting Wheeler poor option.  I will see her back in one month once all these studies are complete.   Cephus Shelling, MD [facility] Office: [phone]

## 2022-08-08 ENCOUNTER — Ambulatory Visit (HOSPITAL_COMMUNITY): Payer: BC Managed Care – PPO

## 2022-08-20 ENCOUNTER — Ambulatory Visit (HOSPITAL_COMMUNITY): Payer: BC Managed Care – PPO

## 2022-08-27 ENCOUNTER — Encounter: Payer: Self-pay | Admitting: Pulmonary Disease

## 2022-08-27 ENCOUNTER — Ambulatory Visit: Payer: BC Managed Care – PPO | Admitting: Pulmonary Disease

## 2022-08-27 VITALS — BP 134/82 | HR 84 | Ht 65.0 in | Wt 209.0 lb

## 2022-08-27 DIAGNOSIS — F17210 Nicotine dependence, cigarettes, uncomplicated: Secondary | ICD-10-CM | POA: Diagnosis not present

## 2022-08-27 DIAGNOSIS — R0602 Shortness of breath: Secondary | ICD-10-CM

## 2022-08-27 MED ORDER — TRELEGY ELLIPTA 100-62.5-25 MCG/ACT IN AEPB: 1.0000 | INHALATION_SPRAY | Freq: Every day | RESPIRATORY_TRACT | 0 refills | Status: DC

## 2022-08-27 MED ORDER — TRELEGY ELLIPTA 100-62.5-25 MCG/ACT IN AEPB: 1.0000 | INHALATION_SPRAY | Freq: Every day | RESPIRATORY_TRACT | 5 refills | Status: DC

## 2022-08-27 NOTE — Progress Notes (Signed)
Synopsis: Referred in April 2024 for COPD by Kristeen Miss, MD  Subjective:   PATIENT ID: Paula Wheeler GENDER: female DOB: [birth year], MRN: [id number]  HPI  Chief Complaint  Patient presents with   Consult    Referred by cardiologist for history of COPD. States she has been having more episodes of SOB. Does wheeze occasionally.    Paula Wheeler is a 72 year old woman, daily smoker with GERD, hypertension, DMII, CKDIV and atrial fibrillation who is referred to pulmonary clinic for evaluation of COPD.   She has exertional dyspnea with getting a shower and getting dressed. She has intermittent wheezing. She has intermittent cough. No significant mucous production.   She has been on spiriva for several years. She has not required prednisone for her breathing recently. She denies any recent respiratory infections.   She works from home, she is an Advertising account planner. She is smoking half a pack per day. She smoked 1 pack or more per day for 7 years, she has been smoking for 50+ years. She had second hand smoke exposure in childhood. Her mother had breast cancer, Brother with kidney cancer.   Past Medical History:  Diagnosis Date   Anemia    COPD (chronic obstructive pulmonary disease) (HCC)    Dyslipidemia    Exogenous obesity    GERD (gastroesophageal reflux disease)    GI bleed 05/14/2016   History of abnormal cells from cervix    Hyperlipidemia    Hypertension    Iron deficiency anemia    Longstanding persistent atrial fibrillation (HCC)    Microalbuminuria    Tobacco dependence    Type II diabetes mellitus (HCC)      Family History  Problem Relation Age of Onset   Cancer Mother        BREAST   Hypertension Mother    Macular degeneration Mother    Heart attack Father    Diabetes Father    Colon polyps Brother    Colon polyps Brother    Colon polyps Brother    Colon polyps Brother    Colon polyps Brother    Colon polyps Brother      Social History    Socioeconomic History   Marital status: Single    Spouse name: Not on file   Number of children: Not on file   Years of education: Not on file   Highest education level: Not on file  Occupational History   Not on file  Tobacco Use   Smoking status: Every Day    Packs/day: 0.50    Years: 40.00    Additional pack years: 0.00    Total pack years: 20.00    Types: Cigarettes    Passive exposure: Never   Smokeless tobacco: Never  Vaping Use   Vaping Use: Never used  Substance and Sexual Activity   Alcohol use: Yes    Comment: 05/14/2016 "I'll have a drink once/year; New Year's"   Drug use: No   Sexual activity: Never  Other Topics Concern   Not on file  Social History Narrative   Not on file   Social Determinants of Health   Financial Resource Strain: Not on file  Food Insecurity: Not on file  Transportation Needs: Not on file  Physical Activity: Not on file  Stress: Not on file  Social Connections: Not on file  Intimate Partner Violence: Not on file     Allergies  Allergen Reactions   Nsaids Swelling    Hands and legs  swell   Penicillins Hives    Has patient had a PCN reaction causing immediate rash, facial/tongue/throat swelling, SOB or lightheadedness with hypotension: Yes Has patient had a PCN reaction causing severe rash involving mucus membranes or skin necrosis: No Has patient had a PCN reaction that required hospitalization: No Has patient had a PCN reaction occurring within the last 10 years: No If all of the above answers are "NO", then may proceed with Cephalosporin use.    Quinapril Hcl     Other reaction(s): elevated potassium and creatinine     Outpatient Medications Prior to Visit  Medication Sig Dispense Refill   albuterol (PROVENTIL HFA;VENTOLIN HFA) 108 (90 Base) MCG/ACT inhaler Inhale 2 puffs into the lungs every 4 (four) hours as needed for wheezing or shortness of breath.     allopurinol (ZYLOPRIM) 100 MG tablet Take 100 mg by mouth daily.      amLODipine (NORVASC) 5 MG tablet Take 5 mg by mouth every morning.     apixaban (ELIQUIS) 5 MG TABS tablet TAKE 1 TABLET(5 MG) BY MOUTH TWICE DAILY 180 tablet 3   atorvastatin (LIPITOR) 40 MG tablet Take 1 tablet (40 mg total) by mouth at bedtime. 90 tablet 3   bisoprolol (ZEBETA) 5 MG tablet TAKE 1 TABLET(5 MG) BY MOUTH DAILY 90 tablet 3   Cyanocobalamin (VITAMIN B-12 IJ) Inject 1 mL as directed every 30 (thirty) days.      escitalopram (LEXAPRO) 10 MG tablet Take 10 mg by mouth as needed.     ezetimibe (ZETIA) 10 MG tablet Take 1 tablet (10 mg total) by mouth daily. 90 tablet 3   ferrous sulfate 325 (65 FE) MG tablet Take 1 tablet (325 mg total) by mouth daily with breakfast. 30 tablet 3   folic acid (FOLVITE) 1 MG tablet Take 1 tablet (1 mg total) by mouth daily. 30 tablet 0   HYDROcodone-acetaminophen (NORCO/VICODIN) 5-325 MG tablet Take 1 tablet by mouth every 6 (six) hours as needed for moderate pain.     insulin NPH-regular Human (70-30) 100 UNIT/ML injection Inject 6 Units into the skin 2 (two) times daily with a meal. Inject 30 units into the skin in the morning and 70 units into the skin in the evening 10 mL 11   Insulin Syringe-Needle U-100 31G X 5/16" 1 ML MISC by Does not apply route.     loratadine (CLARITIN) 10 MG tablet Take 10 mg by mouth daily.     metolazone (ZAROXOLYN) 2.5 MG tablet Take 2.5 mg by mouth daily as needed.     Omega-3 Fatty Acids (FISH OIL) 1000 MG CAPS Take 1 capsule by mouth daily.     pantoprazole (PROTONIX) 40 MG tablet Take 40 mg by mouth daily.     tiZANidine (ZANAFLEX) 4 MG tablet Take 4 mg by mouth 3 (three) times daily.     torsemide (DEMADEX) 20 MG tablet 60mg  (3 tablets) in the morning and 20mg  (1 tablet) in the afternoon     SPIRIVA HANDIHALER 18 MCG inhalation capsule Place 18 mcg into inhaler and inhale daily.      No facility-administered medications prior to visit.    Review of Systems  Constitutional:  Negative for chills, fever,  malaise/fatigue and weight loss.  HENT:  Negative for congestion, sinus pain and sore throat.   Eyes: Negative.   Respiratory:  Positive for cough and shortness of breath. Negative for hemoptysis, sputum production and wheezing.   Cardiovascular:  Negative for chest pain, palpitations, orthopnea, claudication  and leg swelling.  Gastrointestinal:  Negative for abdominal pain, heartburn, nausea and vomiting.  Genitourinary: Negative.   Musculoskeletal:  Negative for joint pain and myalgias.  Skin:  Negative for rash.  Neurological:  Negative for weakness.  Endo/Heme/Allergies: Negative.   Psychiatric/Behavioral: Negative.     Objective:   Vitals:   08/27/22 0829  BP: 134/82  Pulse: 84  SpO2: 98%  Weight: 209 lb (94.8 kg)  Height: 5\' 5"  (1.651 m)   Physical Exam Constitutional:      General: She is not in acute distress.    Appearance: She is obese. She is not ill-appearing.  HENT:     Head: Normocephalic and atraumatic.  Eyes:     General: No scleral icterus.    Conjunctiva/sclera: Conjunctivae normal.     Pupils: Pupils are equal, round, and reactive to light.  Cardiovascular:     Rate and Rhythm: Normal rate and regular rhythm.     Pulses: Normal pulses.     Heart sounds: Normal heart sounds. No murmur heard. Pulmonary:     Effort: Pulmonary effort is normal.     Breath sounds: Normal breath sounds. No wheezing, rhonchi or rales.  Abdominal:     General: Bowel sounds are normal.     Palpations: Abdomen is soft.  Musculoskeletal:     Right lower leg: No edema.     Left lower leg: No edema.  Lymphadenopathy:     Cervical: No cervical adenopathy.  Skin:    General: Skin is warm and dry.  Neurological:     General: No focal deficit present.     Mental Status: She is alert.    CBC    Component Value Date/Time   WBC 9.9 11/29/2019 0703   RBC 2.72 (L) 11/29/2019 0703   HGB 8.8 (L) 11/29/2019 0703   HGB 10.4 (L) 03/31/2017 0846   HCT 27.2 (L) 11/29/2019 0703    HCT 32.6 (L) 03/31/2017 0846   PLT 138 (L) 11/29/2019 0703   PLT 355 03/31/2017 0846   MCV 100.0 11/29/2019 0703   MCV 88 03/31/2017 0846   MCH 32.4 11/29/2019 0703   MCHC 32.4 11/29/2019 0703   RDW 14.9 11/29/2019 0703   RDW 15.6 (H) 03/31/2017 0846   LYMPHSABS 2.0 11/23/2019 0147   LYMPHSABS 1.8 08/20/2016 0815   MONOABS 0.7 11/23/2019 0147   EOSABS 0.0 11/23/2019 0147   EOSABS 0.2 08/20/2016 0815   BASOSABS 0.1 11/23/2019 0147   BASOSABS 0.0 08/20/2016 0815      Latest Ref Rng & Units 07/31/2021    8:37 AM 11/29/2019    7:03 AM 11/28/2019    4:26 AM  BMP  Glucose 70 - 99 mg/dL 841  324  401   BUN 8 - 27 mg/dL 27  28  26    Creatinine 0.57 - 1.00 mg/dL 0.27  2.53  6.64   BUN/Creat Ratio 12 - 28 11     Sodium 134 - 144 mmol/L 143  136  138   Potassium 3.5 - 5.2 mmol/L 4.6  4.2  4.0   Chloride 96 - 106 mmol/L 102  101  104   CO2 20 - 29 mmol/L 26  23  25    Calcium 8.7 - 10.3 mg/dL 9.3  8.8  8.7    Chest imaging: CTA Chest 11/23/19 Cardiovascular: No aortic intramural hematoma. Atheromatous calcification of the aorta and coronaries. No aortic dissection. No major pulmonary artery filling defect. No pericardial effusion. Normal heart size. Atheromatous narrowing of  great vessels not measuring over 50%.   Mediastinum/Nodes: Negative for thoracic adenopathy. Thyroid nodules measuring up to 2.5 cm in the left lobe.   Lungs/Pleura: Small patchy primarily subpleural opacities affecting all lobes. No edema, effusion, or pneumothorax  PFT:     No data to display         Labs:  Path:  Echo:  Heart Catheterization:  Assessment & Plan:   Shortness of breath - Plan: Pulmonary Function Test, Fluticasone-Umeclidin-Vilant (TRELEGY ELLIPTA) 100-62.5-25 MCG/ACT AEPB  Cigarette smoker - Plan: Ambulatory Referral for Lung Cancer Scre  Discussion: Paula Wheeler is a 72 year old woman, daily smoker with GERD, hypertension, DMII, CKDIV and atrial fibrillation who is referred  to pulmonary clinic for evaluation of COPD.   She has significant smoking history with 25+ pack year history. We will check pulmonary function tests to evaluate for obstructive lung disease.   She is to start trelegy ellipta 1 puff daily and stop spiriva handihaler. She can continue albuterol inhaler as needed.   We will refer her to our lung cancer screening program.  We discussed smoking cessation with nicotine replacement therapy. Recommend using 7mg  nicotine patches daily and as needed nicotine gum or lozenges.   Follow up in 2-3 months after PFTs.  Melody Comas, MD [facility] Office: [phone]    Current Outpatient Medications:    albuterol (PROVENTIL HFA;VENTOLIN HFA) 108 (90 Base) MCG/ACT inhaler, Inhale 2 puffs into the lungs every 4 (four) hours as needed for wheezing or shortness of breath., Disp: , Rfl:    allopurinol (ZYLOPRIM) 100 MG tablet, Take 100 mg by mouth daily., Disp: , Rfl:    amLODipine (NORVASC) 5 MG tablet, Take 5 mg by mouth every morning., Disp: , Rfl:    apixaban (ELIQUIS) 5 MG TABS tablet, TAKE 1 TABLET(5 MG) BY MOUTH TWICE DAILY, Disp: 180 tablet, Rfl: 3   atorvastatin (LIPITOR) 40 MG tablet, Take 1 tablet (40 mg total) by mouth at bedtime., Disp: 90 tablet, Rfl: 3   bisoprolol (ZEBETA) 5 MG tablet, TAKE 1 TABLET(5 MG) BY MOUTH DAILY, Disp: 90 tablet, Rfl: 3   Cyanocobalamin (VITAMIN B-12 IJ), Inject 1 mL as directed every 30 (thirty) days. , Disp: , Rfl:    escitalopram (LEXAPRO) 10 MG tablet, Take 10 mg by mouth as needed., Disp: , Rfl:    ezetimibe (ZETIA) 10 MG tablet, Take 1 tablet (10 mg total) by mouth daily., Disp: 90 tablet, Rfl: 3   ferrous sulfate 325 (65 FE) MG tablet, Take 1 tablet (325 mg total) by mouth daily with breakfast., Disp: 30 tablet, Rfl: 3   Fluticasone-Umeclidin-Vilant (TRELEGY ELLIPTA) 100-62.5-25 MCG/ACT AEPB, Inhale 1 puff into the lungs daily., Disp: 28 each, Rfl: 5   folic acid (FOLVITE) 1 MG  tablet, Take 1 tablet (1 mg total) by mouth daily., Disp: 30 tablet, Rfl: 0   HYDROcodone-acetaminophen (NORCO/VICODIN) 5-325 MG tablet, Take 1 tablet by mouth every 6 (six) hours as needed for moderate pain., Disp: , Rfl:    insulin NPH-regular Human (70-30) 100 UNIT/ML injection, Inject 6 Units into the skin 2 (two) times daily with a meal. Inject 30 units into the skin in the morning and 70 units into the skin in the evening, Disp: 10 mL, Rfl: 11   Insulin Syringe-Needle U-100 31G X 5/16" 1 ML MISC, by Does not apply route., Disp: , Rfl:    loratadine (CLARITIN) 10 MG tablet, Take 10 mg by mouth daily., Disp: , Rfl:  metolazone (ZAROXOLYN) 2.5 MG tablet, Take 2.5 mg by mouth daily as needed., Disp: , Rfl:    Omega-3 Fatty Acids (FISH OIL) 1000 MG CAPS, Take 1 capsule by mouth daily., Disp: , Rfl:    pantoprazole (PROTONIX) 40 MG tablet, Take 40 mg by mouth daily., Disp: , Rfl:    tiZANidine (ZANAFLEX) 4 MG tablet, Take 4 mg by mouth 3 (three) times daily., Disp: , Rfl:    torsemide (DEMADEX) 20 MG tablet, 60mg  (3 tablets) in the morning and 20mg  (1 tablet) in the afternoon, Disp: , Rfl:

## 2022-08-27 NOTE — Patient Instructions (Addendum)
Recommend quitting smoking with nicotine replacement therapy - start using 7mg  nicotine patches daily - use mini nicotine lozenges or nicotine gum as needed - plan to stop using cigarettes after a 2 week period from starting the patches/gum/lozenges  Call 1-800-quit-NOW to get free nicotine replacement and counseling from the state of [facility] trelegy ellipta 1 puff daily - rinse mouth out after each use  Stop using spiriva handihaler while on Trelegy  We will schedule you for pulmonary function tests at earliest possible date/time  We will refer you to lung cancer screening program for CT Chest scan screening  Follow up in 2-3 months

## 2022-08-28 ENCOUNTER — Encounter: Payer: Self-pay | Admitting: Podiatry

## 2022-08-28 ENCOUNTER — Ambulatory Visit (INDEPENDENT_AMBULATORY_CARE_PROVIDER_SITE_OTHER): Payer: BC Managed Care – PPO | Admitting: Podiatry

## 2022-08-28 DIAGNOSIS — M79675 Pain in left toe(s): Secondary | ICD-10-CM | POA: Diagnosis not present

## 2022-08-28 DIAGNOSIS — E118 Type 2 diabetes mellitus with unspecified complications: Secondary | ICD-10-CM

## 2022-08-28 DIAGNOSIS — B351 Tinea unguium: Secondary | ICD-10-CM | POA: Diagnosis not present

## 2022-08-28 DIAGNOSIS — M79674 Pain in right toe(s): Secondary | ICD-10-CM | POA: Diagnosis not present

## 2022-08-28 NOTE — Progress Notes (Signed)

## 2022-09-02 ENCOUNTER — Other Ambulatory Visit: Payer: Self-pay

## 2022-09-02 DIAGNOSIS — I6523 Occlusion and stenosis of bilateral carotid arteries: Secondary | ICD-10-CM

## 2022-09-03 ENCOUNTER — Ambulatory Visit: Payer: BC Managed Care – PPO | Admitting: Vascular Surgery

## 2022-09-06 ENCOUNTER — Ambulatory Visit: Payer: BC Managed Care – PPO

## 2022-09-06 ENCOUNTER — Encounter (HOSPITAL_COMMUNITY): Payer: Self-pay

## 2022-09-06 ENCOUNTER — Ambulatory Visit (HOSPITAL_COMMUNITY): Payer: BC Managed Care – PPO

## 2022-09-09 ENCOUNTER — Telehealth (HOSPITAL_COMMUNITY): Payer: Self-pay | Admitting: Cardiovascular Disease

## 2022-09-09 NOTE — Telephone Encounter (Signed)
Patient called and cancelled Myoview for reason for reason below:  09/05/2022 10:48 AM [id number], YVETTE  Cancel Rsn: Patient (emergency came up, will c/b to r/s   Order will be removed from the [facility]. If patient calls back to reschedule we will reinstate the order.  Thank you

## 2022-09-24 ENCOUNTER — Ambulatory Visit (HOSPITAL_COMMUNITY): Payer: BC Managed Care – PPO

## 2022-10-03 ENCOUNTER — Ambulatory Visit
Admission: RE | Admit: 2022-10-03 | Discharge: 2022-10-03 | Disposition: A | Discharge status: Home / Self Care | Payer: BC Managed Care – PPO | Source: Ambulatory Visit | Attending: Vascular Surgery | Admitting: Vascular Surgery

## 2022-10-03 DIAGNOSIS — I6523 Occlusion and stenosis of bilateral carotid arteries: Secondary | ICD-10-CM

## 2022-10-06 IMAGING — MR MR FEMUR*L* W/O CM
4 of 7 series · 12 of 40 positions shown · non-contrast
Comparison: Abdominal CTA runoff 11/23/2019.

CLINICAL DATA: Mass on upper thigh for 2 months with pain and
swelling. No known injury or history of malignancy.

EXAM:
MR OF THE LEFT FEMUR WITHOUT CONTRAST
TECHNIQUE: Multiplanar, multisequence MR imaging of the left thigh was
performed. Patient refused contrast. No intravenous contrast was
administered.

[Series 4: T1 · coronal · right · 3.0mm · 1.17mm/px · 3 of 65 slices shown (1 of 2)]
[im 1/65]
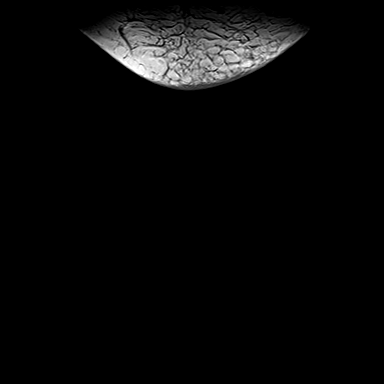
[im 33/65]
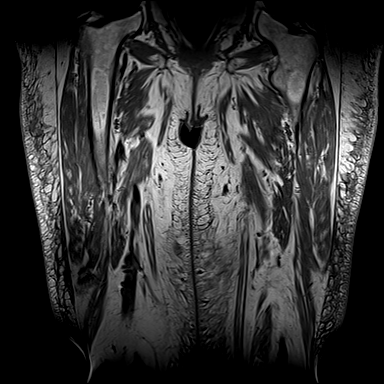
[im 65/65]
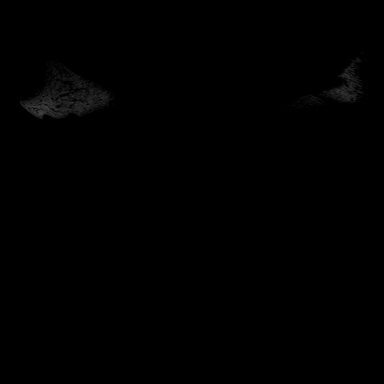

[Series 6: T1 · axial · right · 5.0mm · 0.41mm/px · z∈[-224,+154]mm · 3 of 79 slices shown (2 of 2)]
[im 16/79]
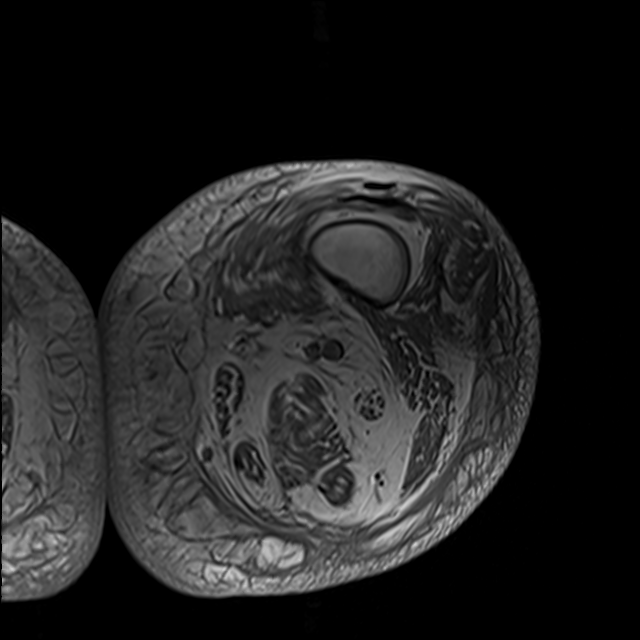
[im 47/79]
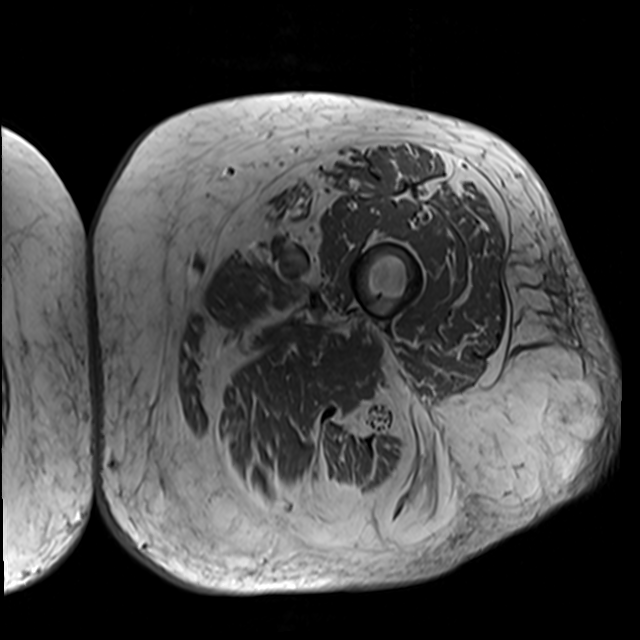
[im 79/79]
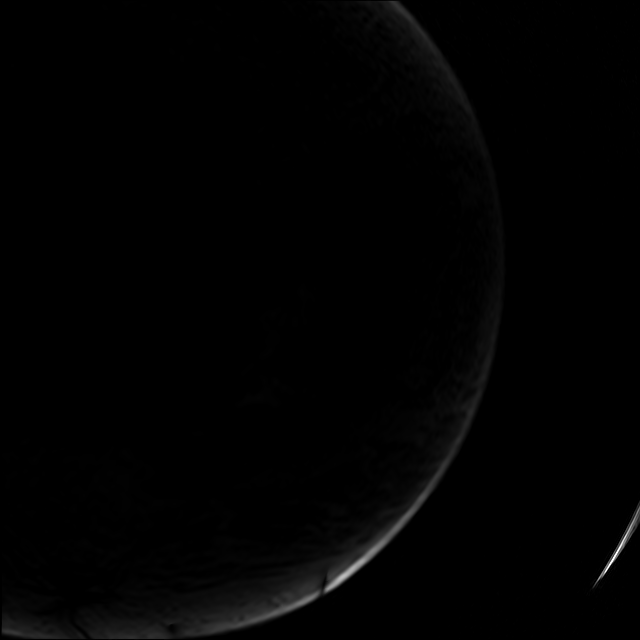

[Series 9: T2 fat-sat · axial · right · 5.0mm · 0.41mm/px · z∈[-224,+154]mm · 3 of 79 slices shown (1 of 2)]
[im 16/79]
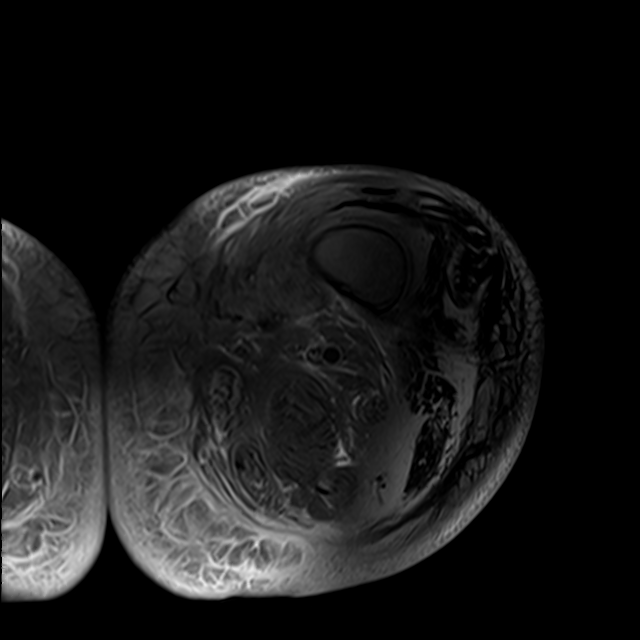
[im 47/79]
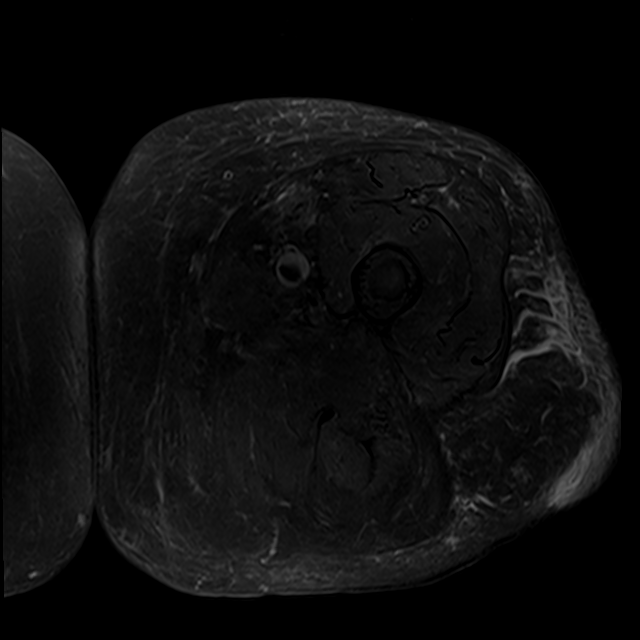
[im 79/79]
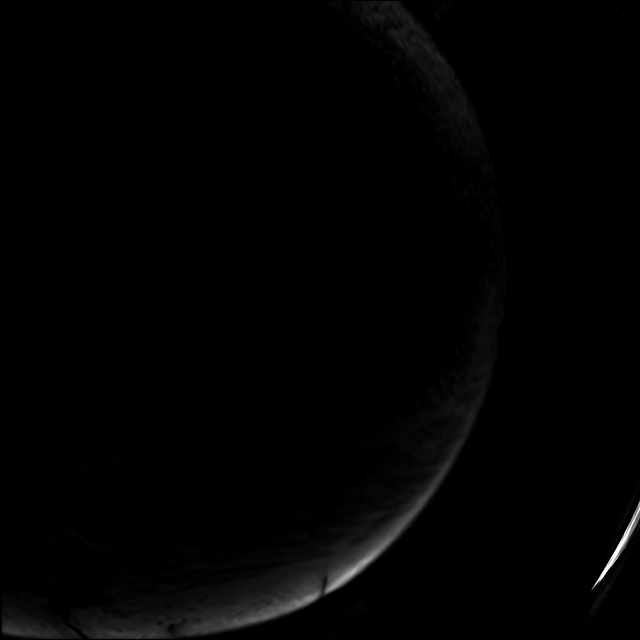

[Series 10: T2 fat-sat · sagittal · right · 3.0mm · 0.50mm/px · 3 of 70 slices shown (2 of 2)]
[im 14/70]
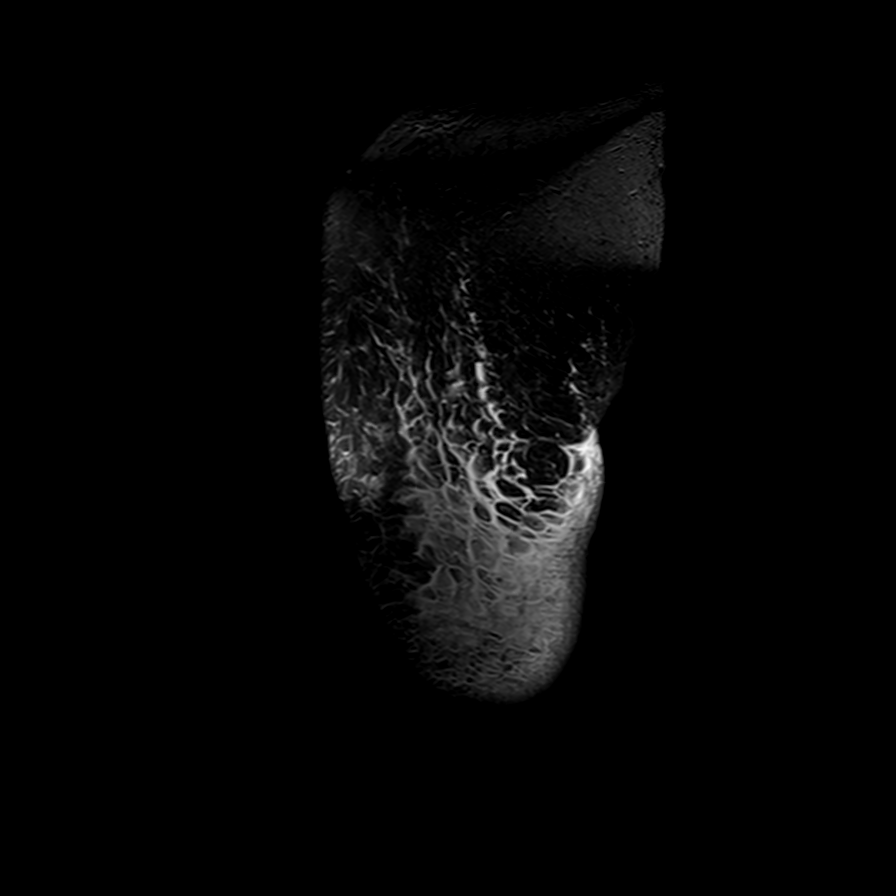
[im 42/70]
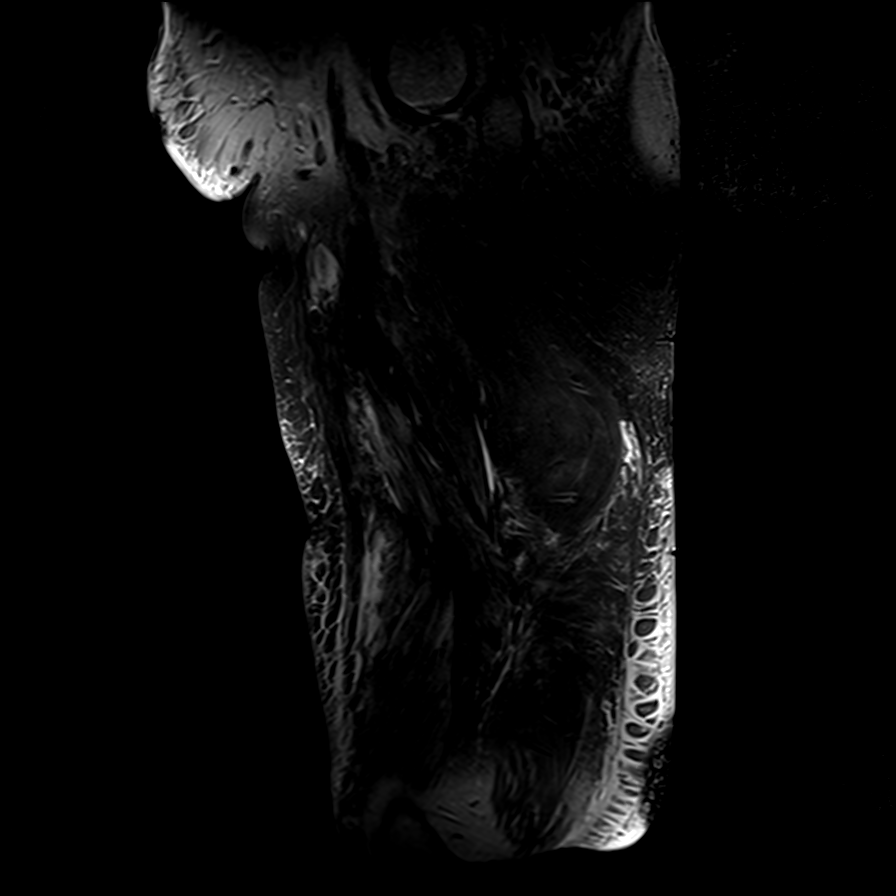
[im 70/70]
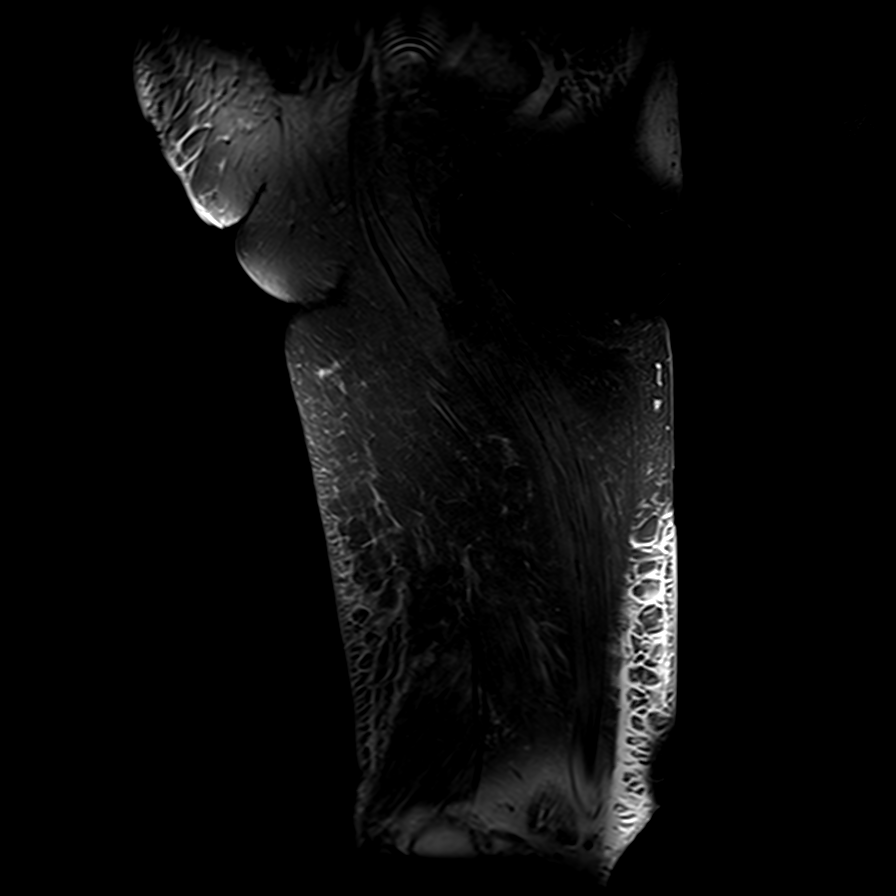

[12 of 40 positions shown; findings below may reference images not displayed]

FINDINGS: Bones/Joint/Cartilage

Markers were placed over the patient's palpable concern medially in
the mid left thigh. Both thighs are included on the coronal images.
As above, although the patient was ordered with and without
contrast, the patient refused contrast administration.

Symmetric marrow changes in both femoral diaphyses consistent with
yellow marrow. No evidence of focal lesion, acute fracture or
dislocation. The hip and knee joints are incompletely visualized,
although no large joint effusions are identified.

Ligaments

Not relevant for exam/indication.

Muscles and Tendons

The thigh muscular trip appears bilaterally symmetric with mild
generalized atrophy. No new focal muscular abnormalities are
identified.

Soft tissues

Generalized subcutaneous edema in both thighs appears mildly
asymmetric medially on the left at the level of the patient's
concern. No focal fluid collection or mass is identified on
noncontrast imaging. No vascular abnormalities or enlarged lymph
nodes are identified.
IMPRESSION: 1. Nonspecific prominent subcutaneous edema in both thighs which may
relate to cellulitis, lymphedema, venous insufficiency or heart
failure. Correlate clinically. The edema appears mildly
asymmetrically increased medially in the left thigh at the level of
the patient's concern. No focal mass lesion identified on
noncontrast imaging. Clinical follow up of any palpable concern
recommended.
2. No focal muscular abnormalities.
3. No acute or significant osseous findings.

## 2022-10-09 ENCOUNTER — Encounter (HOSPITAL_BASED_OUTPATIENT_CLINIC_OR_DEPARTMENT_OTHER): Payer: Self-pay

## 2022-10-09 ENCOUNTER — Other Ambulatory Visit: Payer: Self-pay

## 2022-10-09 DIAGNOSIS — R0602 Shortness of breath: Secondary | ICD-10-CM

## 2022-10-16 ENCOUNTER — Encounter (HOSPITAL_BASED_OUTPATIENT_CLINIC_OR_DEPARTMENT_OTHER): Payer: BC Managed Care – PPO

## 2022-10-16 ENCOUNTER — Encounter (HOSPITAL_BASED_OUTPATIENT_CLINIC_OR_DEPARTMENT_OTHER): Payer: Self-pay

## 2022-10-21 ENCOUNTER — Ambulatory Visit (INDEPENDENT_AMBULATORY_CARE_PROVIDER_SITE_OTHER): Payer: BC Managed Care – PPO | Admitting: Pulmonary Disease

## 2022-10-21 DIAGNOSIS — R0602 Shortness of breath: Secondary | ICD-10-CM | POA: Diagnosis not present

## 2022-10-21 LAB — PULMONARY FUNCTION TEST
DL/VA % pred: 78 %
DL/VA: 3.29 ml/min/mmHg/L
DLCO cor % pred: 72 %
DLCO cor: 13.38 ml/min/mmHg
DLCO unc % pred: 72 %
DLCO unc: 13.38 ml/min/mmHg
FEF 25-75 Post: 1.67 L/sec
FEF 25-75 Pre: 1.4 L/sec
FEF2575-%Change-Post: 19 %
FEF2575-%Pred-Post: 96 %
FEF2575-%Pred-Pre: 81 %
FEV1-%Change-Post: 7 %
FEV1-%Pred-Post: 77 %
FEV1-%Pred-Pre: 72 %
FEV1-Post: 1.61 L
FEV1-Pre: 1.5 L
FEV1FVC-%Change-Post: 3 %
FEV1FVC-%Pred-Pre: 101 %
FEV6-%Change-Post: 3 %
FEV6-%Pred-Post: 78 %
FEV6-%Pred-Pre: 75 %
FEV6-Post: 2.04 L
FEV6-Pre: 1.96 L
FEV6FVC-%Pred-Post: 104 %
FEV6FVC-%Pred-Pre: 104 %
FVC-%Change-Post: 3 %
FVC-%Pred-Post: 74 %
FVC-%Pred-Pre: 71 %
FVC-Post: 2.04 L
FVC-Pre: 1.96 L
Post FEV1/FVC ratio: 79 %
Post FEV6/FVC ratio: 100 %
Pre FEV1/FVC ratio: 76 %
Pre FEV6/FVC Ratio: 100 %
RV % pred: 126 %
RV: 2.73 L
TLC % pred: 94 %
TLC: 4.55 L

## 2022-10-21 NOTE — Patient Instructions (Signed)
Full PFT performed today. °

## 2022-10-21 NOTE — Progress Notes (Signed)
Full PFT performed today. °

## 2022-10-22 ENCOUNTER — Ambulatory Visit: Payer: BC Managed Care – PPO | Admitting: Vascular Surgery

## 2022-10-22 ENCOUNTER — Encounter: Payer: Self-pay | Admitting: Vascular Surgery

## 2022-10-22 VITALS — BP 124/75 | HR 91 | Temp 97.9°F | Wt 190.0 lb

## 2022-10-22 DIAGNOSIS — I6523 Occlusion and stenosis of bilateral carotid arteries: Secondary | ICD-10-CM | POA: Diagnosis not present

## 2022-10-22 NOTE — Progress Notes (Signed)
Patient name: Paula Wheeler MRN: [id number] DOB: 1950-07-30 Sex: female  REASON FOR CONSULT: F/U after CT neck for evaluation of carotid artery disease   HPI: Paula Wheeler is a 72 y.o. female, with hx COPD, HTN, HLD, DM, stage 4 CKD that presents for follow-up after CT neck for evaluation of her carotid artery disease.  Patient she previously seeing her ophthalmologist and he noted some changes to her right eye.  She states she had no vision loss or other signs or symptoms of stroke prior to this.  Her understanding was that these changes were related to her diabetes.  Ultimately she underwent a carotid duplex showing high-grade carotid stenosis bilaterally with a high carotid bifurcation.  She also recently saw her cardiologist Dr. Elease Hashimoto who is recommending a stress test prior to any carotid intervention.  She has also been referred to pulmonology to evaluate her operative risk.  She denies any history of neck surgery or neck radiation.   No signs of stroke or TIA since last visit.  Had to cancel her stress test due to death in family.  Has follow-up with pulmonology on Friday.  Past Medical History:  Diagnosis Date   Anemia    COPD (chronic obstructive pulmonary disease) (HCC)    Dyslipidemia    Exogenous obesity    GERD (gastroesophageal reflux disease)    GI bleed 05/14/2016   History of abnormal cells from cervix    Hyperlipidemia    Hypertension    Iron deficiency anemia    Longstanding persistent atrial fibrillation (HCC)    Microalbuminuria    Tobacco dependence    Type II diabetes mellitus (HCC)     Past Surgical History:  Procedure Laterality Date   CARDIOVERSION N/A 08/07/2016   Procedure: CARDIOVERSION;  Surgeon: Vesta Mixer, MD;  Location: [facility] ENDOSCOPY;  Service: Cardiovascular;  Laterality: N/A;   DIAGNOSTIC MAMMOGRAM  05/2015   ESOPHAGOGASTRODUODENOSCOPY N/A 05/15/2016   Procedure: ESOPHAGOGASTRODUODENOSCOPY (EGD);  Surgeon: Graylin Shiver, MD;   Location: [facility] ENDOSCOPY;  Service: Endoscopy;  Laterality: N/A;   EXPLORATORY LAPAROTOMY  1968   "I think they left my appendix"   PAP SMEAR  2012 AND 2017    Family History  Problem Relation Age of Onset   Cancer Mother        BREAST   Hypertension Mother    Macular degeneration Mother    Heart attack Father    Diabetes Father    Colon polyps Brother    Colon polyps Brother    Colon polyps Brother    Colon polyps Brother    Colon polyps Brother    Colon polyps Brother     SOCIAL HISTORY: Social History   Socioeconomic History   Marital status: Single    Spouse name: Not on file   Number of children: Not on file   Years of education: Not on file   Highest education level: Not on file  Occupational History   Not on file  Tobacco Use   Smoking status: Every Day    Packs/day: 0.50    Years: 40.00    Additional pack years: 0.00    Total pack years: 20.00    Types: Cigarettes    Passive exposure: Never   Smokeless tobacco: Never  Vaping Use   Vaping Use: Never used  Substance and Sexual Activity   Alcohol use: Yes    Comment: 05/14/2016 "I'll have a drink once/year; New Year's"   Drug use: No  Sexual activity: Never  Other Topics Concern   Not on file  Social History Narrative   Not on file   Social Determinants of Health   Financial Resource Strain: Not on file  Food Insecurity: Not on file  Transportation Needs: Not on file  Physical Activity: Not on file  Stress: Not on file  Social Connections: Not on file  Intimate Partner Violence: Not on file    Allergies  Allergen Reactions   Nsaids Swelling    Hands and legs swell   Penicillins Hives    Has patient had a PCN reaction causing immediate rash, facial/tongue/throat swelling, SOB or lightheadedness with hypotension: Yes Has patient had a PCN reaction causing severe rash involving mucus membranes or skin necrosis: No Has patient had a PCN reaction that required hospitalization: No Has patient had  a PCN reaction occurring within the last 10 years: No If all of the above answers are "NO", then may proceed with Cephalosporin use.    Quinapril Hcl     Other reaction(s): elevated potassium and creatinine    Current Outpatient Medications  Medication Sig Dispense Refill   albuterol (PROVENTIL HFA;VENTOLIN HFA) 108 (90 Base) MCG/ACT inhaler Inhale 2 puffs into the lungs every 4 (four) hours as needed for wheezing or shortness of breath.     allopurinol (ZYLOPRIM) 100 MG tablet Take 100 mg by mouth daily.     amLODipine (NORVASC) 5 MG tablet Take 5 mg by mouth every morning.     apixaban (ELIQUIS) 5 MG TABS tablet TAKE 1 TABLET(5 MG) BY MOUTH TWICE DAILY 180 tablet 3   atorvastatin (LIPITOR) 40 MG tablet Take 1 tablet (40 mg total) by mouth at bedtime. 90 tablet 3   bisoprolol (ZEBETA) 5 MG tablet TAKE 1 TABLET(5 MG) BY MOUTH DAILY 90 tablet 3   Cyanocobalamin (VITAMIN B-12 IJ) Inject 1 mL as directed every 30 (thirty) days.      escitalopram (LEXAPRO) 10 MG tablet Take 10 mg by mouth as needed.     ezetimibe (ZETIA) 10 MG tablet Take 1 tablet (10 mg total) by mouth daily. 90 tablet 3   ferrous sulfate 325 (65 FE) MG tablet Take 1 tablet (325 mg total) by mouth daily with breakfast. 30 tablet 3   Fluticasone-Umeclidin-Vilant (TRELEGY ELLIPTA) 100-62.5-25 MCG/ACT AEPB Inhale 1 puff into the lungs daily. 28 each 5   folic acid (FOLVITE) 1 MG tablet Take 1 tablet (1 mg total) by mouth daily. 30 tablet 0   HYDROcodone-acetaminophen (NORCO/VICODIN) 5-325 MG tablet Take 1 tablet by mouth every 6 (six) hours as needed for moderate pain.     insulin NPH-regular Human (70-30) 100 UNIT/ML injection Inject 6 Units into the skin 2 (two) times daily with a meal. Inject 30 units into the skin in the morning and 70 units into the skin in the evening 10 mL 11   Insulin Syringe-Needle U-100 31G X 5/16" 1 ML MISC by Does not apply route.     loratadine (CLARITIN) 10 MG tablet Take 10 mg by mouth daily.      metolazone (ZAROXOLYN) 2.5 MG tablet Take 2.5 mg by mouth daily as needed.     Omega-3 Fatty Acids (FISH OIL) 1000 MG CAPS Take 1 capsule by mouth daily.     pantoprazole (PROTONIX) 40 MG tablet Take 40 mg by mouth daily.     tiZANidine (ZANAFLEX) 4 MG tablet Take 4 mg by mouth 3 (three) times daily.     torsemide (DEMADEX) 20 MG tablet  60mg  (3 tablets) in the morning and 20mg  (1 tablet) in the afternoon     No current facility-administered medications for this visit.    REVIEW OF SYSTEMS:  [X]  denotes positive finding, [ ]  denotes negative finding Cardiac  Comments:  Chest pain or chest pressure:    Shortness of breath upon exertion:    Short of breath when lying flat:    Irregular heart rhythm:        Vascular    Pain in calf, thigh, or hip brought on by ambulation:    Pain in feet at night that wakes you up from your sleep:     Blood clot in your veins:    Leg swelling:         Pulmonary    Oxygen at home:    Productive cough:     Wheezing:         Neurologic    Sudden weakness in arms or legs:     Sudden numbness in arms or legs:     Sudden onset of difficulty speaking or slurred speech:    Temporary loss of vision in one eye:     Problems with dizziness:         Gastrointestinal    Blood in stool:     Vomited blood:         Genitourinary    Burning when urinating:     Blood in urine:        Psychiatric    Major depression:         Hematologic    Bleeding problems:    Problems with blood clotting too easily:        Skin    Rashes or ulcers:        Constitutional    Fever or chills:      PHYSICAL EXAM: Vitals:   10/22/22 0848 10/22/22 0850  BP: 132/62 124/75  Pulse: 91   Temp: 97.9 F (36.6 C)   TempSrc: Temporal   SpO2: 94%   Weight: 190 lb (86.2 kg)     GENERAL: The patient is a well-nourished female, in no acute distress. The vital signs are documented above. CARDIAC: There is a regular rate and rhythm.  PULMONARY: No respiratory  distress. ABDOMEN: Soft and non-tender.  MUSCULOSKELETAL: There are no major deformities or cyanosis. NEUROLOGIC: No focal weakness or paresthesias are detected. SKIN: There are no ulcers or rashes noted. PSYCHIATRIC: The patient has a normal affect.  DATA:   CT neck w/o contrast reviewed from 10/03/22  Carotid Arterial Duplex Study  Patient Name:  Paula Wheeler  Date of Exam:   07/15/2022 Medical Rec #: [id number]          Accession #:    [id number] Date of Birth: [birth year]          Patient Gender: F Patient Age:   61 years Exam Location:  [facility] Procedure:      VAS US CAROTID Referring Phys: Leonette Most ROSS   --------------------------------------------------------------------------- -----   Indications:                            Carotid artery disease, Visual                                         disturbance and Retinal ischemia. Risk Factors:  Hypertension, hyperlipidemia, Diabetes,                                         current smoker, PAD. Other Factors:                          CKD stage 4, CHF. Limitations                             Today's exam was limited due to heavy                                         calcification and the resulting                                         shadowing, the patient's respiratory                                         variation and the high bifurcation of                                         the carotid. Pre-Surgical Evaluation & Surgical      Stenosis at RICA only. Right Correlation                             bifurcation is located near the                                         mandible. Stenosis at left ICA only.                                         Left bifurcation is located near the                                         mandible.  Performing Technologist: Lowell Guitar RVT, RDMS    Examination Guidelines: A complete evaluation includes B-mode  imaging, spectral Doppler, color Doppler, and power Doppler as needed of all accessible portions of each vessel. Bilateral testing is considered an integral part of a complete examination. Limited examinations for reoccurring indications may be performed as noted.    Right Carotid Findings: +----------+--------+--------+--------+------------------------------+----- ----+           PSV cm/sEDV cm/sStenosisPlaque Description             Comments  +----------+--------+--------+--------+------------------------------+----- ----+ CCA Prox  85      16                                                      +----------+--------+--------+--------+------------------------------+----- ----+  CCA Mid   84      9                                                       +----------+--------+--------+--------+------------------------------+----- ----+ CCA Distal73      12                                                      +----------+--------+--------+--------+------------------------------+----- ----+ ICA Prox  567     188     80-99%  diffuse, irregular,           Shadowing                                   heterogenous and calcific               +----------+--------+--------+--------+------------------------------+----- ----+ ICA Mid   122                                                    Shadowing +----------+--------+--------+--------+------------------------------+----- ----+ ICA Distal69      10                                             Shadowing +----------+--------+--------+--------+------------------------------+----- ----+ ECA       188     21                                                      +----------+--------+--------+--------+------------------------------+----- ----+  +----------+--------+-------+--------+-------------------+           PSV cm/sEDV cmsDescribeArm Pressure  (mmHG) +----------+--------+-------+--------+-------------------+ Subclavian                       138                 +----------+--------+-------+--------+-------------------+  +---------+--------+--+--------+--+---------+ VertebralPSV cm/s91EDV cm/s27Antegrade +---------+--------+--+--------+--+---------+  Right thyroid complex heterogeneous irregular bordered nodule with peripheral and internal vascularity. structure measures 3.9x2.2x2.1cm   Left Carotid Findings: +----------+--------+--------+--------+-------------------------------+---- ----+           PSV cm/sEDV cm/sStenosisPlaque Description             Comments +----------+--------+--------+--------+-------------------------------+---- ----+ CCA Prox  99      26                                                       +----------+--------+--------+--------+-------------------------------+---- ----+ CCA Mid   127     33                                                       +----------+--------+--------+--------+-------------------------------+---- ----+  CCA Distal118     35                                                       +----------+--------+--------+--------+-------------------------------+---- ----+ ICA Prox  541     192     80-99%  diffuse, irregular,                                                        heterogenous and calcific                +----------+--------+--------+--------+-------------------------------+---- ----+ ICA Mid   424     146     80-99%                                 tortuous +----------+--------+--------+--------+-------------------------------+---- ----+ ICA Distal296     70                                             tortuous +----------+--------+--------+--------+-------------------------------+---- ----+ ECA       148     18                                                        +----------+--------+--------+--------+-------------------------------+---- ----+  +----------+--------+--------+----------------+-------------------+           PSV cm/sEDV cm/sDescribe        Arm Pressure (mmHG) +----------+--------+--------+----------------+-------------------+ [id number]             Multiphasic, ACZ660                 +----------+--------+--------+----------------+-------------------+  +---------+--------+--+--------+--+---------+ VertebralPSV cm/s97EDV cm/s23Antegrade +---------+--------+--+--------+--+---------+        Summary: Right Carotid: Velocities in the right ICA are consistent with a 80-99%                stenosis.  Left Carotid: Velocities in the left ICA are consistent with a 80-99% stenosis.  Vertebrals:  Bilateral vertebral arteries demonstrate antegrade flow. Subclavians: Normal flow hemodynamics were seen in the left subclavian artery.  *See table(s) above for measurements and observations.    Assessment/Plan:  72 y.o. female, with hx COPD, HTN, HLD, DM, stage 4 CKD presents for follow-up after CT neck for further evaluation of her carotid artery disease.  Previously seen with bilateral high-grade carotid artery stenosis over 80%.  There was concern for her carotid bifurcation being high and possibly inaccessible surgically.  I sent her for CT neck and this had to be done without contrast given her CKD.  I discussed I reviewed these images and she has what looks like a bifurcation below the jawline that should be surgically accessible for carotid endarterectomy.  I also discussed the option of TCAR.  She is favoring TCAR at this time and discussed would involve giving her small amount of contrast.  She is  still awaiting her stress test ordered by Dr. Elease Hashimoto.  She also has follow-up with pulmonology on Friday after PFT testing.  I discussed that all this needs to be done first given she has asymptomatic carotid disease.  I  will see her back in about 6 to 8 weeks.  She is leaning toward TCAR.  I discussed this is for stroke risk reduction.    Cephus Shelling, MD [facility] Office: [phone]

## 2022-10-25 ENCOUNTER — Ambulatory Visit: Payer: BC Managed Care – PPO | Admitting: Pulmonary Disease

## 2022-10-25 ENCOUNTER — Encounter: Payer: Self-pay | Admitting: Pulmonary Disease

## 2022-10-25 VITALS — BP 120/60 | HR 106 | Ht 63.0 in | Wt 188.6 lb

## 2022-10-25 DIAGNOSIS — F17210 Nicotine dependence, cigarettes, uncomplicated: Secondary | ICD-10-CM | POA: Diagnosis not present

## 2022-10-25 DIAGNOSIS — J438 Other emphysema: Secondary | ICD-10-CM

## 2022-10-25 NOTE — Patient Instructions (Addendum)
Continue trelegy ellipta 1 puff daily - rinse mouth out after each use  Continue albuterol inhaler as needed  Recommend using mini nicotine lozenges or nicotine gum as needed as you continue to wean off cigarettes.  Your breathing tests show evidence of air trapping which is consistent with COPD and mild diffusion defect  Follow up in 6 months

## 2022-10-25 NOTE — Progress Notes (Signed)
Synopsis: Referred in April 2024 for COPD by Kristeen Miss, MD  Subjective:   PATIENT ID: Paula Wheeler GENDER: female DOB: 06/25/1950, MRN: [id number]  HPI  Chief Complaint  Patient presents with   Follow-up    F/up pn PFT   Paula Wheeler is a 72 year old woman, daily smoker with GERD, hypertension, DMII, CKDIV and atrial fibrillation who returns to pulmonary clinic for evaluation of COPD.   Initial Consult 08/27/22 She has exertional dyspnea with getting a shower and getting dressed. She has intermittent wheezing. She has intermittent cough. No significant mucous production.   She has been on spiriva for several years. She has not required prednisone for her breathing recently. She denies any recent respiratory infections.   She works from home, she is an Advertising account planner. She is smoking half a pack per day. She smoked 1 pack or more per day for 7 years, she has been smoking for 50+ years. She had second hand smoke exposure in childhood. Her mother had breast cancer, Brother with kidney cancer.   Today - 10/25/22 She was started on trelegy ellipta 1 puff daily at last visit. She has noted significant improvement in her breathing.   PFTs show mild air trapping with mild decrease in DLCO.   Smoking - she cut down to 8 cigarettes per day   Past Medical History:  Diagnosis Date   Anemia    COPD (chronic obstructive pulmonary disease) (HCC)    Dyslipidemia    Exogenous obesity    GERD (gastroesophageal reflux disease)    GI bleed 05/14/2016   History of abnormal cells from cervix    Hyperlipidemia    Hypertension    Iron deficiency anemia    Longstanding persistent atrial fibrillation (HCC)    Microalbuminuria    Tobacco dependence    Type II diabetes mellitus (HCC)      Family History  Problem Relation Age of Onset   Cancer Mother        BREAST   Hypertension Mother    Macular degeneration Mother    Heart attack Father    Diabetes Father    Colon polyps  Brother    Colon polyps Brother    Colon polyps Brother    Colon polyps Brother    Colon polyps Brother    Colon polyps Brother      Social History   Socioeconomic History   Marital status: Single    Spouse name: Not on file   Number of children: Not on file   Years of education: Not on file   Highest education level: Not on file  Occupational History   Not on file  Tobacco Use   Smoking status: Every Day    Packs/day: 0.50    Years: 40.00    Additional pack years: 0.00    Total pack years: 20.00    Types: Cigarettes    Passive exposure: Never   Smokeless tobacco: Never   Tobacco comments:    Still smoking 8-10 cigarettes a day. 10/25/2022 Tay  Vaping Use   Vaping Use: Never used  Substance and Sexual Activity   Alcohol use: Yes    Comment: 05/14/2016 "I'll have a drink once/year; New Year's"   Drug use: No   Sexual activity: Never  Other Topics Concern   Not on file  Social History Narrative   Not on file   Social Determinants of Health   Financial Resource Strain: Not on file  Food Insecurity: Not on  file  Transportation Needs: Not on file  Physical Activity: Not on file  Stress: Not on file  Social Connections: Not on file  Intimate Partner Violence: Not on file     Allergies  Allergen Reactions   Nsaids Swelling    Hands and legs swell   Penicillins Hives    Has patient had a PCN reaction causing immediate rash, facial/tongue/throat swelling, SOB or lightheadedness with hypotension: Yes Has patient had a PCN reaction causing severe rash involving mucus membranes or skin necrosis: No Has patient had a PCN reaction that required hospitalization: No Has patient had a PCN reaction occurring within the last 10 years: No If all of the above answers are "NO", then may proceed with Cephalosporin use.    Quinapril Hcl     Other reaction(s): elevated potassium and creatinine     Outpatient Medications Prior to Visit  Medication Sig Dispense Refill    albuterol (PROVENTIL HFA;VENTOLIN HFA) 108 (90 Base) MCG/ACT inhaler Inhale 2 puffs into the lungs every 4 (four) hours as needed for wheezing or shortness of breath.     allopurinol (ZYLOPRIM) 100 MG tablet Take 100 mg by mouth daily.     amLODipine (NORVASC) 5 MG tablet Take 5 mg by mouth every morning.     apixaban (ELIQUIS) 5 MG TABS tablet TAKE 1 TABLET(5 MG) BY MOUTH TWICE DAILY 180 tablet 3   atorvastatin (LIPITOR) 40 MG tablet Take 1 tablet (40 mg total) by mouth at bedtime. 90 tablet 3   bisoprolol (ZEBETA) 5 MG tablet TAKE 1 TABLET(5 MG) BY MOUTH DAILY 90 tablet 3   Cyanocobalamin (VITAMIN B-12 IJ) Inject 1 mL as directed every 30 (thirty) days.      escitalopram (LEXAPRO) 10 MG tablet Take 10 mg by mouth as needed.     ezetimibe (ZETIA) 10 MG tablet Take 1 tablet (10 mg total) by mouth daily. 90 tablet 3   ferrous sulfate 325 (65 FE) MG tablet Take 1 tablet (325 mg total) by mouth daily with breakfast. 30 tablet 3   Fluticasone-Umeclidin-Vilant (TRELEGY ELLIPTA) 100-62.5-25 MCG/ACT AEPB Inhale 1 puff into the lungs daily. 28 each 5   folic acid (FOLVITE) 1 MG tablet Take 1 tablet (1 mg total) by mouth daily. 30 tablet 0   HYDROcodone-acetaminophen (NORCO/VICODIN) 5-325 MG tablet Take 1 tablet by mouth every 6 (six) hours as needed for moderate pain.     insulin NPH-regular Human (70-30) 100 UNIT/ML injection Inject 6 Units into the skin 2 (two) times daily with a meal. Inject 30 units into the skin in the morning and 70 units into the skin in the evening 10 mL 11   Insulin Syringe-Needle U-100 31G X 5/16" 1 ML MISC by Does not apply route.     loratadine (CLARITIN) 10 MG tablet Take 10 mg by mouth daily.     metolazone (ZAROXOLYN) 2.5 MG tablet Take 2.5 mg by mouth daily as needed.     Omega-3 Fatty Acids (FISH OIL) 1000 MG CAPS Take 1 capsule by mouth daily.     pantoprazole (PROTONIX) 40 MG tablet Take 40 mg by mouth daily.     tiZANidine (ZANAFLEX) 4 MG tablet Take 4 mg by mouth 3  (three) times daily.     torsemide (DEMADEX) 20 MG tablet 60mg  (3 tablets) in the morning and 20mg  (1 tablet) in the afternoon     No facility-administered medications prior to visit.    Review of Systems  Constitutional:  Negative for chills, fever,  malaise/fatigue and weight loss.  HENT:  Negative for congestion, sinus pain and sore throat.   Eyes: Negative.   Respiratory:  Positive for cough. Negative for hemoptysis, sputum production, shortness of breath and wheezing.   Cardiovascular:  Negative for chest pain, palpitations, orthopnea, claudication and leg swelling.  Gastrointestinal:  Negative for abdominal pain, heartburn, nausea and vomiting.  Genitourinary: Negative.   Musculoskeletal:  Negative for joint pain and myalgias.  Skin:  Negative for rash.  Neurological:  Negative for weakness.  Endo/Heme/Allergies: Negative.   Psychiatric/Behavioral: Negative.     Objective:   Vitals:   10/25/22 0842  BP: 120/60  Pulse: (!) 106  SpO2: 95%  Weight: 188 lb 9.6 oz (85.5 kg)  Height: 5\' 3"  (1.6 m)    Physical Exam Constitutional:      General: She is not in acute distress.    Appearance: She is obese. She is not ill-appearing.  HENT:     Head: Normocephalic and atraumatic.  Eyes:     General: No scleral icterus.    Conjunctiva/sclera: Conjunctivae normal.     Pupils: Pupils are equal, round, and reactive to light.  Cardiovascular:     Rate and Rhythm: Normal rate and regular rhythm.     Pulses: Normal pulses.     Heart sounds: Normal heart sounds. No murmur heard. Pulmonary:     Effort: Pulmonary effort is normal.     Breath sounds: Normal breath sounds. No wheezing, rhonchi or rales.  Musculoskeletal:     Right lower leg: No edema.     Left lower leg: No edema.  Skin:    General: Skin is warm and dry.  Neurological:     Mental Status: She is alert.    CBC    Component Value Date/Time   WBC 9.9 11/29/2019 0703   RBC 2.72 (L) 11/29/2019 0703   HGB 8.8 (L)  11/29/2019 0703   HGB 10.4 (L) 03/31/2017 0846   HCT 27.2 (L) 11/29/2019 0703   HCT 32.6 (L) 03/31/2017 0846   PLT 138 (L) 11/29/2019 0703   PLT 355 03/31/2017 0846   MCV 100.0 11/29/2019 0703   MCV 88 03/31/2017 0846   MCH 32.4 11/29/2019 0703   MCHC 32.4 11/29/2019 0703   RDW 14.9 11/29/2019 0703   RDW 15.6 (H) 03/31/2017 0846   LYMPHSABS 2.0 11/23/2019 0147   LYMPHSABS 1.8 08/20/2016 0815   MONOABS 0.7 11/23/2019 0147   EOSABS 0.0 11/23/2019 0147   EOSABS 0.2 08/20/2016 0815   BASOSABS 0.1 11/23/2019 0147   BASOSABS 0.0 08/20/2016 0815      Latest Ref Rng & Units 07/31/2021    8:37 AM 11/29/2019    7:03 AM 11/28/2019    4:26 AM  BMP  Glucose 70 - 99 mg/dL 161  096  045   BUN 8 - 27 mg/dL 27  28  26    Creatinine 0.57 - 1.00 mg/dL 4.09  8.11  9.14   BUN/Creat Ratio 12 - 28 11     Sodium 134 - 144 mmol/L 143  136  138   Potassium 3.5 - 5.2 mmol/L 4.6  4.2  4.0   Chloride 96 - 106 mmol/L 102  101  104   CO2 20 - 29 mmol/L 26  23  25    Calcium 8.7 - 10.3 mg/dL 9.3  8.8  8.7    Chest imaging: CTA Chest 11/23/19 Cardiovascular: No aortic intramural hematoma. Atheromatous calcification of the aorta and coronaries. No aortic dissection. No major  pulmonary artery filling defect. No pericardial effusion. Normal heart size. Atheromatous narrowing of great vessels not measuring over 50%.   Mediastinum/Nodes: Negative for thoracic adenopathy. Thyroid nodules measuring up to 2.5 cm in the left lobe.   Lungs/Pleura: Small patchy primarily subpleural opacities affecting all lobes. No edema, effusion, or pneumothorax  PFT:    Latest Ref Rng & Units 10/21/2022    7:59 AM  PFT Results  FVC-Pre L 1.96   FVC-Predicted Pre % 71   FVC-Post L 2.04   FVC-Predicted Post % 74   Pre FEV1/FVC % % 76   Post FEV1/FCV % % 79   FEV1-Pre L 1.50   FEV1-Predicted Pre % 72   FEV1-Post L 1.61   DLCO uncorrected ml/min/mmHg 13.38   DLCO UNC% % 72   DLCO corrected ml/min/mmHg 13.38   DLCO COR  %Predicted % 72   DLVA Predicted % 78   TLC L 4.55   TLC % Predicted % 94   RV % Predicted % 126    Labs:  Path:  Echo:  Heart Catheterization:  Assessment & Plan:   Other emphysema (HCC)  Cigarette smoker  Discussion: Paula Wheeler is a 72 year old woman, daily smoker with GERD, hypertension, DMII, CKDIV and atrial fibrillation who returns to pulmonary clinic for COPD.   PFTs with mild air trapping and mild diffusion defect   She has noted significant symptom relief with trelegy ellipta 1 puff daily. She is to continue this and as needed albuterol.   She has been referred to lung cancer screening program.   She is cutting down on cigarettes on her own. Reviewed nicotine gum or lozenge use as needed.   Follow up in 6 months.   Melody Comas, MD [facility] Office: [phone]    Current Outpatient Medications:    albuterol (PROVENTIL HFA;VENTOLIN HFA) 108 (90 Base) MCG/ACT inhaler, Inhale 2 puffs into the lungs every 4 (four) hours as needed for wheezing or shortness of breath., Disp: , Rfl:    allopurinol (ZYLOPRIM) 100 MG tablet, Take 100 mg by mouth daily., Disp: , Rfl:    amLODipine (NORVASC) 5 MG tablet, Take 5 mg by mouth every morning., Disp: , Rfl:    apixaban (ELIQUIS) 5 MG TABS tablet, TAKE 1 TABLET(5 MG) BY MOUTH TWICE DAILY, Disp: 180 tablet, Rfl: 3   atorvastatin (LIPITOR) 40 MG tablet, Take 1 tablet (40 mg total) by mouth at bedtime., Disp: 90 tablet, Rfl: 3   bisoprolol (ZEBETA) 5 MG tablet, TAKE 1 TABLET(5 MG) BY MOUTH DAILY, Disp: 90 tablet, Rfl: 3   Cyanocobalamin (VITAMIN B-12 IJ), Inject 1 mL as directed every 30 (thirty) days. , Disp: , Rfl:    escitalopram (LEXAPRO) 10 MG tablet, Take 10 mg by mouth as needed., Disp: , Rfl:    ezetimibe (ZETIA) 10 MG tablet, Take 1 tablet (10 mg total) by mouth daily., Disp: 90 tablet, Rfl: 3   ferrous sulfate 325 (65 FE) MG tablet, Take 1 tablet (325 mg total) by mouth daily with  breakfast., Disp: 30 tablet, Rfl: 3   Fluticasone-Umeclidin-Vilant (TRELEGY ELLIPTA) 100-62.5-25 MCG/ACT AEPB, Inhale 1 puff into the lungs daily., Disp: 28 each, Rfl: 5   folic acid (FOLVITE) 1 MG tablet, Take 1 tablet (1 mg total) by mouth daily., Disp: 30 tablet, Rfl: 0   HYDROcodone-acetaminophen (NORCO/VICODIN) 5-325 MG tablet, Take 1 tablet by mouth every 6 (six) hours as needed for moderate pain., Disp: , Rfl:    insulin NPH-regular  Human (70-30) 100 UNIT/ML injection, Inject 6 Units into the skin 2 (two) times daily with a meal. Inject 30 units into the skin in the morning and 70 units into the skin in the evening, Disp: 10 mL, Rfl: 11   Insulin Syringe-Needle U-100 31G X 5/16" 1 ML MISC, by Does not apply route., Disp: , Rfl:    loratadine (CLARITIN) 10 MG tablet, Take 10 mg by mouth daily., Disp: , Rfl:    metolazone (ZAROXOLYN) 2.5 MG tablet, Take 2.5 mg by mouth daily as needed., Disp: , Rfl:    Omega-3 Fatty Acids (FISH OIL) 1000 MG CAPS, Take 1 capsule by mouth daily., Disp: , Rfl:    pantoprazole (PROTONIX) 40 MG tablet, Take 40 mg by mouth daily., Disp: , Rfl:    tiZANidine (ZANAFLEX) 4 MG tablet, Take 4 mg by mouth 3 (three) times daily., Disp: , Rfl:    torsemide (DEMADEX) 20 MG tablet, 60mg  (3 tablets) in the morning and 20mg  (1 tablet) in the afternoon, Disp: , Rfl:

## 2022-10-28 ENCOUNTER — Ambulatory Visit: Payer: BC Managed Care – PPO

## 2022-11-28 ENCOUNTER — Ambulatory Visit: Payer: BC Managed Care – PPO | Admitting: Podiatry

## 2022-11-29 ENCOUNTER — Ambulatory Visit: Payer: BC Managed Care – PPO | Attending: Cardiovascular Disease

## 2022-12-17 ENCOUNTER — Ambulatory Visit: Payer: BC Managed Care – PPO | Admitting: Vascular Surgery

## 2023-01-06 ENCOUNTER — Ambulatory Visit (INDEPENDENT_AMBULATORY_CARE_PROVIDER_SITE_OTHER): Payer: BC Managed Care – PPO | Admitting: Podiatry

## 2023-01-06 ENCOUNTER — Encounter: Payer: Self-pay | Admitting: Podiatry

## 2023-01-06 DIAGNOSIS — E118 Type 2 diabetes mellitus with unspecified complications: Secondary | ICD-10-CM | POA: Diagnosis not present

## 2023-01-06 DIAGNOSIS — M79674 Pain in right toe(s): Secondary | ICD-10-CM

## 2023-01-06 DIAGNOSIS — B351 Tinea unguium: Secondary | ICD-10-CM | POA: Diagnosis not present

## 2023-01-06 DIAGNOSIS — M79675 Pain in left toe(s): Secondary | ICD-10-CM

## 2023-01-06 NOTE — Progress Notes (Addendum)
This patient returns to my office for at risk foot care.  This patient requires this care by a professional since this patient will be at risk due to having diabetes.  This patient is unable to cut nails herself since the patient cannot reach her nails.These nails are painful walking and wearing shoes.  This patient presents for at risk foot care today.  General Appearance  Alert, conversant and in no acute stress.  Vascular  Dorsalis pedis and posterior tibial  pulses are palpable  bilaterally.  Capillary return is within normal limits  bilaterally. Temperature is within normal limits  bilaterally.  Neurologic  Senn-Weinstein monofilament wire test within normal limits  bilaterally. Muscle power within normal limits bilaterally.  Nails Thick disfigured discolored nails with subungual debris  from hallux to fifth toes bilaterally. No evidence of bacterial infection or drainage bilaterally.  Orthopedic  No limitations of motion  feet .  No crepitus or effusions noted.  No bony pathology or digital deformities noted.  Skin  normotropic skin with no porokeratosis noted bilaterally.  No signs of infections or ulcers noted.     Onychomycosis  Pain in right toes  Pain in left toes  Consent was obtained for treatment procedures.   Mechanical debridement of nails 1-5  bilaterally performed with a nail nipper.  Filed with dremel without incident.  Padding dispensed for second toe left foot.   Return office visit     3 months                 Told patient to return for periodic foot care and evaluation due to potential at risk complications.   Helane Gunther DPM

## 2023-01-23 ENCOUNTER — Other Ambulatory Visit: Payer: Self-pay | Admitting: Pulmonary Disease

## 2023-01-23 DIAGNOSIS — R0602 Shortness of breath: Secondary | ICD-10-CM

## 2023-04-07 ENCOUNTER — Encounter: Payer: Self-pay | Admitting: Podiatry

## 2023-04-07 ENCOUNTER — Ambulatory Visit (INDEPENDENT_AMBULATORY_CARE_PROVIDER_SITE_OTHER): Payer: BC Managed Care – PPO | Admitting: Podiatry

## 2023-04-07 DIAGNOSIS — E118 Type 2 diabetes mellitus with unspecified complications: Secondary | ICD-10-CM

## 2023-04-07 DIAGNOSIS — M79674 Pain in right toe(s): Secondary | ICD-10-CM | POA: Diagnosis not present

## 2023-04-07 DIAGNOSIS — M2042 Other hammer toe(s) (acquired), left foot: Secondary | ICD-10-CM | POA: Insufficient documentation

## 2023-04-07 DIAGNOSIS — M79675 Pain in left toe(s): Secondary | ICD-10-CM

## 2023-04-07 DIAGNOSIS — B351 Tinea unguium: Secondary | ICD-10-CM | POA: Diagnosis not present

## 2023-04-07 NOTE — Progress Notes (Signed)
This patient returns to my office for at risk foot care.  This patient requires this care by a professional since this patient will be at risk due to having diabetes.  This patient is unable to cut nails herself since the patient cannot reach her nails.These nails are painful walking and wearing shoes.  This patient presents for at risk foot care today.  General Appearance  Alert, conversant and in no acute stress.  Vascular  Dorsalis pedis and posterior tibial  pulses are palpable  bilaterally.  Capillary return is within normal limits  bilaterally. Temperature is within normal limits  bilaterally.  Neurologic  Senn-Weinstein monofilament wire test within normal limits  bilaterally. Muscle power within normal limits bilaterally.  Nails Thick disfigured discolored nails with subungual debris  from hallux to fifth toes bilaterally. No evidence of bacterial infection or drainage bilaterally.  Orthopedic  No limitations of motion  feet .  No crepitus or effusions noted.  Hammer toes 2,3 left foot.  Skin  normotropic skin with no porokeratosis noted bilaterally.  No signs of infections or ulcers  Clavi third toe left and second toe left  noted.     Onychomycosis  Pain in right toes  Pain in left toes  Hammer toes left foot.  Consent was obtained for treatment procedures.   Mechanical debridement of nails 1-5  bilaterally performed with a nail nipper.  Filed with dremel without incident.  Padding dispensed for second toe left foot.   Return office visit     3 months                 Told patient to return for periodic foot care and evaluation due to potential at risk complications.   Helane Gunther DPM

## 2023-04-20 ENCOUNTER — Other Ambulatory Visit: Payer: Self-pay | Admitting: Cardiovascular Disease

## 2023-04-20 DIAGNOSIS — E782 Mixed hyperlipidemia: Secondary | ICD-10-CM

## 2023-04-20 DIAGNOSIS — I6523 Occlusion and stenosis of bilateral carotid arteries: Secondary | ICD-10-CM

## 2023-04-20 DIAGNOSIS — J449 Chronic obstructive pulmonary disease, unspecified: Secondary | ICD-10-CM

## 2023-04-20 DIAGNOSIS — I4819 Other persistent atrial fibrillation: Secondary | ICD-10-CM

## 2023-05-16 ENCOUNTER — Other Ambulatory Visit: Payer: Self-pay | Admitting: Cardiovascular Disease

## 2023-07-04 ENCOUNTER — Ambulatory Visit: Payer: BC Managed Care – PPO | Admitting: Cardiovascular Disease

## 2023-07-07 ENCOUNTER — Ambulatory Visit: Payer: BC Managed Care – PPO | Admitting: Podiatry

## 2023-07-16 ENCOUNTER — Inpatient Hospital Stay (HOSPITAL_COMMUNITY)
Admission: EM | Admit: 2023-07-16 | Discharge: 2023-07-18 | DRG: 059 | Disposition: A | Discharge status: Home Health | Attending: Internal Medicine | Admitting: Internal Medicine

## 2023-07-16 ENCOUNTER — Inpatient Hospital Stay (HOSPITAL_COMMUNITY)

## 2023-07-16 ENCOUNTER — Encounter (HOSPITAL_COMMUNITY): Payer: Self-pay

## 2023-07-16 ENCOUNTER — Emergency Department (HOSPITAL_COMMUNITY)

## 2023-07-16 ENCOUNTER — Other Ambulatory Visit: Payer: Self-pay

## 2023-07-16 DIAGNOSIS — R739 Hyperglycemia, unspecified: Secondary | ICD-10-CM

## 2023-07-16 DIAGNOSIS — Z7901 Long term (current) use of anticoagulants: Secondary | ICD-10-CM

## 2023-07-16 DIAGNOSIS — E11621 Type 2 diabetes mellitus with foot ulcer: Secondary | ICD-10-CM | POA: Diagnosis present

## 2023-07-16 DIAGNOSIS — K219 Gastro-esophageal reflux disease without esophagitis: Secondary | ICD-10-CM | POA: Diagnosis present

## 2023-07-16 DIAGNOSIS — Z886 Allergy status to analgesic agent status: Secondary | ICD-10-CM

## 2023-07-16 DIAGNOSIS — R42 Dizziness and giddiness: Secondary | ICD-10-CM | POA: Diagnosis present

## 2023-07-16 DIAGNOSIS — J449 Chronic obstructive pulmonary disease, unspecified: Secondary | ICD-10-CM | POA: Diagnosis present

## 2023-07-16 DIAGNOSIS — R0681 Apnea, not elsewhere classified: Secondary | ICD-10-CM | POA: Diagnosis present

## 2023-07-16 DIAGNOSIS — L97519 Non-pressure chronic ulcer of other part of right foot with unspecified severity: Secondary | ICD-10-CM | POA: Diagnosis present

## 2023-07-16 DIAGNOSIS — Z7951 Long term (current) use of inhaled steroids: Secondary | ICD-10-CM

## 2023-07-16 DIAGNOSIS — Z833 Family history of diabetes mellitus: Secondary | ICD-10-CM

## 2023-07-16 DIAGNOSIS — G939 Disorder of brain, unspecified: Secondary | ICD-10-CM | POA: Diagnosis not present

## 2023-07-16 DIAGNOSIS — E6609 Other obesity due to excess calories: Secondary | ICD-10-CM | POA: Diagnosis present

## 2023-07-16 DIAGNOSIS — E1140 Type 2 diabetes mellitus with diabetic neuropathy, unspecified: Secondary | ICD-10-CM | POA: Diagnosis present

## 2023-07-16 DIAGNOSIS — E7800 Pure hypercholesterolemia, unspecified: Secondary | ICD-10-CM | POA: Diagnosis present

## 2023-07-16 DIAGNOSIS — I6521 Occlusion and stenosis of right carotid artery: Secondary | ICD-10-CM | POA: Diagnosis present

## 2023-07-16 DIAGNOSIS — G372 Central pontine myelinolysis: Principal | ICD-10-CM | POA: Diagnosis present

## 2023-07-16 DIAGNOSIS — Z8249 Family history of ischemic heart disease and other diseases of the circulatory system: Secondary | ICD-10-CM

## 2023-07-16 DIAGNOSIS — I4811 Longstanding persistent atrial fibrillation: Secondary | ICD-10-CM | POA: Diagnosis present

## 2023-07-16 DIAGNOSIS — F17210 Nicotine dependence, cigarettes, uncomplicated: Secondary | ICD-10-CM | POA: Diagnosis present

## 2023-07-16 DIAGNOSIS — Z720 Tobacco use: Secondary | ICD-10-CM | POA: Diagnosis not present

## 2023-07-16 DIAGNOSIS — H6123 Impacted cerumen, bilateral: Secondary | ICD-10-CM | POA: Diagnosis present

## 2023-07-16 DIAGNOSIS — R531 Weakness: Secondary | ICD-10-CM

## 2023-07-16 DIAGNOSIS — E876 Hypokalemia: Secondary | ICD-10-CM | POA: Diagnosis present

## 2023-07-16 DIAGNOSIS — Z83518 Family history of other specified eye disorder: Secondary | ICD-10-CM

## 2023-07-16 DIAGNOSIS — S91109A Unspecified open wound of unspecified toe(s) without damage to nail, initial encounter: Secondary | ICD-10-CM

## 2023-07-16 DIAGNOSIS — I10 Essential (primary) hypertension: Secondary | ICD-10-CM | POA: Diagnosis present

## 2023-07-16 DIAGNOSIS — Z79899 Other long term (current) drug therapy: Secondary | ICD-10-CM

## 2023-07-16 DIAGNOSIS — E1165 Type 2 diabetes mellitus with hyperglycemia: Secondary | ICD-10-CM | POA: Diagnosis present

## 2023-07-16 DIAGNOSIS — Z83719 Family history of colon polyps, unspecified: Secondary | ICD-10-CM

## 2023-07-16 DIAGNOSIS — L89152 Pressure ulcer of sacral region, stage 2: Secondary | ICD-10-CM | POA: Diagnosis present

## 2023-07-16 DIAGNOSIS — Z794 Long term (current) use of insulin: Secondary | ICD-10-CM | POA: Diagnosis not present

## 2023-07-16 DIAGNOSIS — R296 Repeated falls: Secondary | ICD-10-CM | POA: Diagnosis present

## 2023-07-16 DIAGNOSIS — Z888 Allergy status to other drugs, medicaments and biological substances status: Secondary | ICD-10-CM

## 2023-07-16 DIAGNOSIS — I872 Venous insufficiency (chronic) (peripheral): Secondary | ICD-10-CM

## 2023-07-16 DIAGNOSIS — E1151 Type 2 diabetes mellitus with diabetic peripheral angiopathy without gangrene: Secondary | ICD-10-CM | POA: Diagnosis present

## 2023-07-16 DIAGNOSIS — Z880 Allergy status to penicillin: Secondary | ICD-10-CM

## 2023-07-16 DIAGNOSIS — Z6831 Body mass index (BMI) 31.0-31.9, adult: Secondary | ICD-10-CM

## 2023-07-16 DIAGNOSIS — R8271 Bacteriuria: Secondary | ICD-10-CM | POA: Diagnosis present

## 2023-07-16 DIAGNOSIS — I639 Cerebral infarction, unspecified: Secondary | ICD-10-CM | POA: Diagnosis not present

## 2023-07-16 DIAGNOSIS — Z803 Family history of malignant neoplasm of breast: Secondary | ICD-10-CM

## 2023-07-16 LAB — BASIC METABOLIC PANEL
Anion gap: 9 (ref 5–15)
BUN: 21 mg/dL (ref 8–23)
CO2: 28 mmol/L (ref 22–32)
Calcium: 9 mg/dL (ref 8.9–10.3)
Chloride: 94 mmol/L — ABNORMAL LOW (ref 98–111)
Creatinine, Ser: 1.91 mg/dL — ABNORMAL HIGH (ref 0.44–1.00)
GFR, Estimated: 28 mL/min — ABNORMAL LOW (ref >60)
Glucose, Bld: 676 mg/dL (ref 70–99)
Potassium: 4.4 mmol/L (ref 3.5–5.1)
Sodium: 131 mmol/L — ABNORMAL LOW (ref 135–145)

## 2023-07-16 LAB — CBG MONITORING, ED
Glucose-Capillary: 328 mg/dL — ABNORMAL HIGH (ref 70–99)
Glucose-Capillary: 413 mg/dL — ABNORMAL HIGH (ref 70–99)
Glucose-Capillary: >600 mg/dL (ref 70–99)

## 2023-07-16 LAB — CBC
HCT: 39.4 % (ref 36.0–46.0)
Hemoglobin: 12.6 g/dL (ref 12.0–15.0)
MCH: 31.7 pg (ref 26.0–34.0)
MCHC: 32 g/dL (ref 30.0–36.0)
MCV: 99.2 fL (ref 80.0–100.0)
Platelets: 226 10*3/uL (ref 150–400)
RBC: 3.97 MIL/uL (ref 3.87–5.11)
RDW: 14.5 % (ref 11.5–15.5)
WBC: 10.7 10*3/uL — ABNORMAL HIGH (ref 4.0–10.5)
nRBC: 0 % (ref 0.0–0.2)

## 2023-07-16 LAB — URINALYSIS, ROUTINE W REFLEX MICROSCOPIC
Bilirubin Urine: NEGATIVE
Glucose, UA: >=500 mg/dL — AB
Ketones, ur: NEGATIVE mg/dL
Nitrite: NEGATIVE
Protein, ur: NEGATIVE mg/dL
RBC / HPF: >50 RBC/hpf (ref 0–5)
Specific Gravity, Urine: 1.021 (ref 1.005–1.030)
pH: 6 (ref 5.0–8.0)

## 2023-07-16 MED ORDER — INSULIN ASPART PROT & ASPART (70-30 MIX) 100 UNIT/ML ~~LOC~~ SUSP
60.0000 [IU] | Freq: Once | SUBCUTANEOUS | Status: DC
  Filled 2023-07-16 (×2): qty 10

## 2023-07-16 MED ORDER — GADOBUTROL 1 MMOL/ML IV SOLN
8.0000 mL | Freq: Once | INTRAVENOUS | Status: AC | PRN
  Administered 2023-07-16: 8 mL via INTRAVENOUS

## 2023-07-16 MED ORDER — INSULIN ASPART 100 UNIT/ML IJ SOLN
10.0000 [IU] | Freq: Once | INTRAMUSCULAR | Status: AC
  Administered 2023-07-16: 10 [IU] via SUBCUTANEOUS

## 2023-07-16 MED ORDER — SODIUM CHLORIDE 0.9 % IV BOLUS
1000.0000 mL | Freq: Once | INTRAVENOUS | Status: AC
  Administered 2023-07-16: 1000 mL via INTRAVENOUS

## 2023-07-16 NOTE — ED Triage Notes (Signed)
 Pt arrives via POV. PT reports for the past month she has been experiencing a fullness in her head and she gets dizzy when she stands up. Pt reports she has fallen 5 times in the past month. Denies hitting her head during the falls and no loc. Pt is on eliquis. She is AxOx4. Pt reports generalized weakness. C/o pain to right hand and left lower leg from one of the falls. Last fall was this past Sunday. Reports she has taken a course of antibiotics for a sinus infection. No acute focal neurological deficits noted in triage.

## 2023-07-16 NOTE — ED Provider Notes (Signed)
 [place] EMERGENCY DEPARTMENT AT [facility] Provider Note   CSN: [id number] Arrival date & time: 07/16/23  4098     History  Chief Complaint  Patient presents with   Weakness   Fall    Paula Wheeler is a 73 y.o. female with history of hypertension, hypercholesterolemia, venous insufficiency, type 2 diabetes on insulin, A-fib on Eliquis, presented today with generalized weakness.  Patient reports that she had a flulike syndrome about 1 month ago.  She was diagnosed clinically by her doctor but not with a positive test.  She says that ever since then she feels that her balance and equilibrium of been off.  She says she has had 5 falls since then.  She says when she stands up she feels like the room spinning around her.  She denies prior history of vertigo.  She states she is also seen her PCP over the past month and diagnosed with a possible sinus infection and put on 2 courses of antibiotics as well as a course of prednisone.  She finished prednisone 7 days ago.  He says she has not felt any better he continues to have a sensation of pressure inside her head, not specifically pain.  She denies visual changes, numbness or weakness, facial droop.  She is here with her husband at the bedside.  They deny prior history of stroke.  They do report she has significant carotid stenosis but was told that she cannot have endarterectomy because she was a high surgical risk candidate.  She reports that today she only took her Eliquis.  She did not take her insulin or other medications this morning.  She typically takes insulin 70/30 30 units in the morning and 60 units around 8 PM in the evening.  She denies changes in her hearing, pain in her ears, drainage, ringing in her years.  She says she was referred to an ENT doctor by her PCP due to this vertigo symptom.  She has complete diabetic neuropathy and loss of sensation in her feet.  She drinks a lot of soda, and is currently drinking  [facility] as well as [employer].  HPI     Home Medications Prior to Admission medications   Medication Sig Start Date End Date Taking? Authorizing Provider  albuterol (PROVENTIL HFA;VENTOLIN HFA) 108 (90 Base) MCG/ACT inhaler Inhale 2 puffs into the lungs every 4 (four) hours as needed for wheezing or shortness of breath.    [provider]  allopurinol (ZYLOPRIM) 100 MG tablet Take 100 mg by mouth daily.    [provider]  amLODipine (NORVASC) 5 MG tablet Take 5 mg by mouth every morning. 04/26/20   [provider]  apixaban (ELIQUIS) 5 MG TABS tablet TAKE 1 TABLET(5 MG) BY MOUTH TWICE DAILY 07/29/22   Nahser, Deloris Ping, MD  atorvastatin (LIPITOR) 40 MG tablet Take 1 tablet (40 mg total) by mouth at bedtime. 08/06/21   Nahser, Deloris Ping, MD  bisoprolol (ZEBETA) 5 MG tablet TAKE 1 TABLET(5 MG) BY MOUTH DAILY 07/30/22   Nahser, Deloris Ping, MD  Cyanocobalamin (VITAMIN B-12 IJ) Inject 1 mL as directed every 30 (thirty) days.     [provider]  escitalopram (LEXAPRO) 10 MG tablet Take 10 mg by mouth as needed.    [provider]  ezetimibe (ZETIA) 10 MG tablet Take 1 tablet (10 mg total) by mouth daily. 07/29/22   Nahser, Deloris Ping, MD  ferrous sulfate 325 (65 FE) MG tablet Take 1  tablet (325 mg total) by mouth daily with breakfast. 11/30/19   Regalado, Jon Billings A, MD  Fluticasone-Umeclidin-Vilant (TRELEGY ELLIPTA) 100-62.5-25 MCG/ACT AEPB INHALE 1 PUFF INTO THE LUNGS DAILY 01/23/23   Martina Sinner, MD  folic acid (FOLVITE) 1 MG tablet Take 1 tablet (1 mg total) by mouth daily. 11/30/19   Regalado, Belkys A, MD  HYDROcodone-acetaminophen (NORCO/VICODIN) 5-325 MG tablet Take 1 tablet by mouth every 6 (six) hours as needed for moderate pain.    [provider]  insulin NPH-regular Human (70-30) 100 UNIT/ML injection Inject 6 Units into the skin 2 (two) times daily with a meal. Inject 30 units into the skin in the morning and 70 units into the skin in the  evening 11/29/19   Regalado, Belkys A, MD  Insulin Syringe-Needle U-100 31G X 5/16" 1 ML MISC by Does not apply route.    [provider]  loratadine (CLARITIN) 10 MG tablet Take 10 mg by mouth daily.    [provider]  metolazone (ZAROXOLYN) 2.5 MG tablet Take 2.5 mg by mouth daily as needed. 07/05/22   [provider]  Omega-3 Fatty Acids (FISH OIL) 1000 MG CAPS Take 1 capsule by mouth daily.    [provider]  pantoprazole (PROTONIX) 40 MG tablet Take 40 mg by mouth daily. 01/16/20   [provider]  tiZANidine (ZANAFLEX) 4 MG tablet Take 4 mg by mouth 3 (three) times daily. 07/12/22   [provider]  torsemide (DEMADEX) 20 MG tablet 60mg  (3 tablets) in the morning and 20mg  (1 tablet) in the afternoon    [provider]      Allergies    Nsaids, Penicillins, and Quinapril hcl    Review of Systems   Review of Systems  Physical Exam Updated Vital Signs BP (!) 165/46 (BP Location: Right Arm)   Pulse 64   Temp 98 F (36.7 C)   Resp (!) 22   Ht 5\' 3"  (1.6 m)   Wt 80.3 kg   SpO2 100%   BMI 31.35 kg/m  Physical Exam Constitutional:      General: She is not in acute distress. HENT:     Head: Normocephalic and atraumatic.     Right Ear: There is impacted cerumen.     Left Ear: There is impacted cerumen.  Eyes:     Conjunctiva/sclera: Conjunctivae normal.     Pupils: Pupils are equal, round, and reactive to light.  Cardiovascular:     Rate and Rhythm: Normal rate and regular rhythm.  Pulmonary:     Effort: Pulmonary effort is normal. No respiratory distress.  Abdominal:     General: There is no distension.     Tenderness: There is no abdominal tenderness.  Musculoskeletal:     Comments: Erythema without significant warmth symmetrical of the lower extremities (chronic according to patient) with some lateral ulceration to the left leg and anterior on the right lower leg.  Right great toe with surrounding erythema and a  small abrasion overlying the tip of the toe, no deep ulceration  Skin:    General: Skin is warm and dry.  Neurological:     Mental Status: She is alert and oriented to person, place, and time.     Comments: No active nystagmus on hints exam, no abnormal head impulse testing, no difficulty with finger-to-nose.  Cranial nerves are grossly intact.  No visual field deficits No isolated motor weakness of the arms or legs on exam  Psychiatric:  Mood and Affect: Mood normal.        Behavior: Behavior normal.     ED Results / Procedures / Treatments   Labs (all labs ordered are listed, but only abnormal results are displayed) Labs Reviewed  BASIC METABOLIC PANEL - Abnormal; Notable for the following components:      Result Value   Sodium 131 (*)    Chloride 94 (*)    Glucose, Bld 676 (*)    Creatinine, Ser 1.91 (*)    GFR, Estimated 28 (*)    All other components within normal limits  CBC - Abnormal; Notable for the following components:   WBC 10.7 (*)    All other components within normal limits  URINALYSIS, ROUTINE W REFLEX MICROSCOPIC - Abnormal; Notable for the following components:   APPearance HAZY (*)    Glucose, UA >=500 (*)    Hgb urine dipstick SMALL (*)    Leukocytes,Ua LARGE (*)    Bacteria, UA RARE (*)    All other components within normal limits  CBG MONITORING, ED - Abnormal; Notable for the following components:   Glucose-Capillary >600 (*)    All other components within normal limits  CBG MONITORING, ED - Abnormal; Notable for the following components:   Glucose-Capillary 413 (*)    All other components within normal limits    EKG EKG Interpretation Date/Time:  Wednesday July 16 2023 09:37:19 EDT Ventricular Rate:  75 PR Interval:    QRS Duration:  86 QT Interval:  424 QTC Calculation: 473 R Axis:   -28  Text Interpretation: Atrial fibrillation Possible Anterior infarct , age undetermined ST & T wave abnormality, consider inferior ischemia Abnormal  ECG When compared with ECG of 23-Nov-2019 02:04, PREVIOUS ECG IS PRESENT Confirmed by Vanetta Mulders [phone]) on 07/16/2023 10:57:39 AM  Radiology DG Toe Great Right Result Date: 07/16/2023 CLINICAL DATA:  Osteomyelitis EXAM: RIGHT GREAT TOE COMPARISON:  None Available. FINDINGS: Frontal, oblique, and lateral views of the right first digit are obtained on 5 total images. There are no acute or destructive bony abnormalities. No periosteal reaction. 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 There is diffuse soft tissue swelling of the first digit and dorsal margin of the forefoot. No evidence of subcutaneous gas or radiopaque foreign body. IMPRESSION: 1. Soft tissue swelling of the forefoot and first digit. No radiographic evidence of osteomyelitis. 2. 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 Electronically Signed   By: Sharlet Salina M.D.   On: 07/16/2023 17:52   MR BRAIN WO CONTRAST Result Date: 07/16/2023 CLINICAL DATA:  Abnormal CT scan. Headache. Neuro deficit. Generalized weakness. Dizziness. Recent falls. EXAM: MRI HEAD WITHOUT CONTRAST TECHNIQUE: Multiplanar, multiecho pulse sequences of the brain and surrounding structures were obtained without intravenous contrast. COMPARISON:  CT head without contrast 07/16/2023. FINDINGS: Brain: Somewhat ill-defined restricted diffusion is present within the central pons. A striated T2 and FLAIR appearance is present. No expansion of the brainstem or mass effect is evident. No focal susceptibility is present to suggest hemorrhage or calcification. The supratentorial brain is otherwise within normal limits. No acute infarct, hemorrhage or mass lesion is present in the supratentorial brain. No significant white matter lesions are present. Deep brain nuclei are within normal limits. Insert normal ventricles No significant extraaxial fluid collection is present. Cerebellum is unremarkable. The internal auditory canals are  within normal limits. 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 Flow is present in the supraclinoid ICA, A1 and M1 segments. Flow  is present in the left internal carotid artery in the posterior circulation. Skull and upper cervical spine: The brainstem and cerebellum are within normal limits. Sinuses/Orbits: The paranasal sinuses and mastoid air cells are clear. The globes and orbits are within normal limits. IMPRESSION: 1. Ill-defined restricted diffusion within the central pons with a striated T2 and FLAIR appearance. 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 A demyelinating process could have a similar appearance. Neoplasm is considered less likely. Postcontrast imaging would be helpful to further delineate the diagnosis. 2. 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 Flow is present in the supraclinoid ICA, A1 and M1 segments. Flow is present in the left internal carotid artery in the posterior circulation. 3. The supratentorial brain is within normal limits. These results were called by telephone at the time of interpretation on 07/16/2023 at 5:36 pm to provider Dr. Renaye Rakers, who verbally acknowledged these results. Electronically Signed   By: Marin Roberts M.D.   On: 07/16/2023 17:37   CT HEAD WO CONTRAST Result Date: 07/16/2023 CLINICAL DATA:  Headache, neuro deficit. Generalized weakness, head fullness, and dizziness with multiple recent falls. EXAM: CT HEAD WITHOUT CONTRAST TECHNIQUE: Contiguous axial images were obtained from the base of the skull through the vertex without intravenous contrast. RADIATION DOSE REDUCTION: This exam was performed according to the departmental dose-optimization program which includes automated exposure control, adjustment of the mA and/or kV according to patient size and/or use of iterative reconstruction technique. COMPARISON:  Neck CT 10/03/2022  FINDINGS: Brain: There is no evidence of an acute infarct, mass, midline shift, or extra-axial fluid collection. Cerebral volume is within normal limits for age. The ventricles are normal in size. There is a 3 mm hyperdense focus centrally in the pons without associated edema which appears new from the prior neck CT and could reflect recent hemorrhage or mineralization. There is no evidence of acute intracranial hemorrhage elsewhere. Vascular: Calcified atherosclerosis at the skull base. No hyperdense vessel. Skull: No acute fracture or suspicious lesion. Sinuses/Orbits: Partial opacification of a posterior right ethmoid air cell. Minimal bilateral mastoid air cell opacification. Unremarkable orbits. Other: None. IMPRESSION: 1. 3 mm hyperdensity in the pons, indeterminate for a small recent hemorrhage or mineralization. Recommend brain MRI for further evaluation. 2. No other evidence of acute intracranial abnormality. Electronically Signed   By: Sebastian Ache M.D.   On: 07/16/2023 11:43    Procedures Procedures    Medications Ordered in ED Medications  insulin aspart protamine- aspart (NOVOLOG MIX 70/30) injection 60 Units (has no administration in time range)  insulin aspart (novoLOG) injection 10 Units (10 Units Subcutaneous Given 07/16/23 1634)  sodium chloride 0.9 % bolus 1,000 mL (1,000 mLs Intravenous New Bag/Given 07/16/23 1752)  gadobutrol (GADAVIST) 1 MMOL/ML injection 8 mL (8 mLs Intravenous Contrast Given 07/16/23 1929)    ED Course/ Medical Decision Making/ A&P Clinical Course as of 07/16/23 2010  Wed Jul 16, 2023  1733 Neuroradiologist reporting potential demyelination syndrome  [MT]  1812 I spoke to Dr Otelia Limes neurology regarding the abnormal MRI read.  He did request repeat MRI with contrast to better differentiate the patient's lesion, which may be related to demyelination from glucose swings or osmotic changes, or less likely postinfectious.  However neurology will consult on the  patient and provide further recommendations.  In the meantime the patient is finishing her fluids and will have her insulin rechecked.  I updated the patient and her husband.  It has been an extensive stay in the ER, but we will proceed with additional MRI imaging.  She has a preference not to stay longer in the hospital overnight if possible.  These recommendations will be per neurology. [MT]  1814 I lowered the patient's insulin 70/30 sixty units for 8 pm which is her normal time to take this. Patient and her husband confirmed this dosing, and per her medical chart [MT]  2006 Dr Wilford Corner neurology requesting admission for echo, vascular ultrasound, glucose control [MT]    Clinical Course User Index [MT] Renaye Rakers Kermit Balo, MD                                 Medical Decision Making Amount and/or Complexity of Data Reviewed Labs: ordered. Radiology: ordered.  Risk Prescription drug management. Decision regarding hospitalization.   This patient presents to the ED with concern for low, falls, balance difficulty. This involves an extensive number of treatment options, and is a complaint that carries with it a high risk of complications and morbidity.  The differential diagnosis includes CVA versus peripheral vertigo versus metabolic derangement versus other  Co-morbidities that complicate the patient evaluation: Diabetes at higher risk of infection, metabolic derangement  Additional history obtained from patient's husband at bedside   I ordered and personally interpreted labs.  The pertinent results include: Glycemia with blood sugar sick 76, no anion gap.  Creatinine 1.9.  Pseudohyponatremia noted.  UA with large leukocytes, negative nitrites, hazy in appearance  I ordered imaging studies including CT head, x-ray of the toe, MRI of the brain I independently visualized and interpreted imaging which showed potential demyelinating lesion in the pons of the brain noted on MRI. I agree with the  radiologist interpretation  Subsequent MRI with contrast was ordered and pending at the time of admission  The patient was maintained on a cardiac monitor.  I personally viewed and interpreted the cardiac monitored which showed an underlying rhythm of: Rate controlled atrial fibrillation  Per my interpretation the patient's ECG shows atrial fibrillation with no acute ischemic findings  I ordered medication including fluids and short acting insulin for hyperglycemia  I have reviewed the patients home medicines and have made adjustments as needed  Test Considered: Doubt acute angitis, doubt acute PE  I requested consultation with the neurology,  and discussed lab and imaging findings as well as pertinent plan - they recommend: Medical admission for vascular workup including echocardiogram, carotid ultrasound, and tight metabolic/NA/BS control - no emergent indication for lumbar puncture at this time.  Neurologist Dr Wilford Corner. aware the patient does have chronic carotid occlusions per their report; okay to proceed with vascular ultrasound of the carotids at this time, no emergent indication for angiogram imaging given the patient's tenuous renal function, but final neurology consultation is still pending.  She can continue on Eliquis for now.  After the interventions noted above, I reevaluated the patient and found that they have: improved - BS improving with insulin, fluids  Social Determinants of Health: patient made aware of concerns for hyperglycemia in the setting of high-dose of sugary beverage consumption, recurring soda drinking.  Dispostion:  After consideration of the diagnostic results and the patients response to treatment, I feel that the patent would benefit from medical admission         Final Clinical Impression(s) / ED Diagnoses Final diagnoses:  Brain lesion  Hyperglycemia  Open wound of toe, initial encounter  Stasis dermatitis of both legs  Vertigo    Rx / DC  Orders ED Discharge Orders     None         Tyanne Derocher, Kermit Balo, MD 07/16/23 2010

## 2023-07-16 NOTE — ED Provider Triage Note (Signed)
 Emergency Medicine Provider Triage Evaluation Note  Paula Wheeler , a 73 y.o. female  was evaluated in triage.  Pt complains of dizziness, generalized weakness, face fullness. Has taken 2 ABX recently for sinus infection. Hx DM, venous insufficiency.   Review of Systems  Positive: Fatigue, Leg wounds, dizziness with standing Negative: Fever, chills, CP, SHOB, urinary Sx  Physical Exam  BP (!) 155/69   Pulse 62   Temp 97.9 F (36.6 C)   Resp 15   Ht 5\' 3"  (1.6 m)   Wt 80.3 kg   SpO2 100%   BMI 31.35 kg/m  Gen:   Awake, no distress   Resp:  Normal effort  MSK:   Moves extremities without difficulty  Other:  Bilateral leg swelling  Medical Decision Making  Medically screening exam initiated at 1:40 PM.  Appropriate orders placed.  Paula Wheeler was informed that the remainder of the evaluation will be completed by another provider, this initial triage assessment does not replace that evaluation, and the importance of remaining in the ED until their evaluation is complete.  Labs and imaging ordered   Gretta Began 07/16/23 1346

## 2023-07-16 NOTE — ED Notes (Signed)
 PT to CT. No change in symptoms.

## 2023-07-16 NOTE — Consult Note (Signed)
 NEUROLOGY CONSULT NOTE   Date of service: July 16, 2023 Patient Name: Paula Wheeler MRN:  [id number] DOB:  07/25/1950 Chief Complaint: "Headache, generalized weakness and falls" Requesting Provider: Terald Sleeper, MD  History of Present Illness  Paula Wheeler is a 73 y.o. female with hx of diabetes, hypertension, hyperlipidemia persistent atrial fibrillation on Eliquis COPD, presenting for evaluation of frequent falls in the last few days and a headache and head fullness as well as dizziness that has happened now for about a month.  She reports that she has been having generalized weakness and difficulty walking which has now been going on for over a month.  She feels like her head is in a tunnel.  She was given steroids and antibiotics by outpatient provider a few weeks ago which she has been compliant to.  She also takes her insulin as prescribed. 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  A demyelinating process could also be considered and so could be a neoplasm for which a postcontrast imaging was suggested and has just been completed-review pending. 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  There is a known history of 85% stenosis of the right ICA per the family.  Supratentorial brain within normal limits.  LKW: 1 month ago Modified rankin score: 2-Slight disability-UNABLE to perform all activities but does not need assistance IV Thrombolysis: Outside the window EVT: Outside window  NIHSS components Score: Comment  1a Level of Conscious 0[x]  1[]  2[]  3[]      1b LOC Questions 0[x]  1[]  2[]       1c LOC Commands 0[x]  1[]  2[]       2 Best Gaze 0[x]  1[]  2[]        3 Visual 0[x]  1[]  2[]  3[]      4 Facial Palsy 0[x]  1[]  2[]  3[]      5a Motor Arm - left 0[x]  1[]  2[]  3[]  4[]  UN[]    5b Motor Arm - Right 0[x]  1[]  2[]  3[]  4[]  UN[]    6a Motor Leg - Left 0[x]  1[]  2[]  3[]  4[]  UN[]    6b Motor Leg - Right 0[x]  1[]  2[]  3[]  4[]  UN[]    7 Limb Ataxia 0[x]  1[]  2[]  3[]  UN[]     8 Sensory 0[x]  1[]  2[]  UN[]      9 Best Language 0[x]  1[]  2[]  3[]      10 Dysarthria 0[x]  1[]  2[]  UN[]      11 Extinct. and Inattention 0[x]  1[]  2[]       TOTAL: 0      ROS  Comprehensive ROS performed and pertinent positives documented in HPI    Past History   Past Medical History:  Diagnosis Date   Anemia    COPD (chronic obstructive pulmonary disease) (HCC)    Dyslipidemia    Exogenous obesity    GERD (gastroesophageal reflux disease)    GI bleed 05/14/2016   History of abnormal cells from cervix    Hyperlipidemia    Hypertension    Iron deficiency anemia    Longstanding persistent atrial fibrillation (HCC)    Microalbuminuria    Tobacco dependence    Type II diabetes mellitus (HCC)     Past Surgical History:  Procedure Laterality Date  CARDIOVERSION N/A 08/07/2016   Procedure: CARDIOVERSION;  Surgeon: Vesta Mixer, MD;  Location: [facility] ENDOSCOPY;  Service: Cardiovascular;  Laterality: N/A;   DIAGNOSTIC MAMMOGRAM  05/2015   ESOPHAGOGASTRODUODENOSCOPY N/A 05/15/2016   Procedure: ESOPHAGOGASTRODUODENOSCOPY (EGD);  Surgeon: Graylin Shiver, MD;  Location: [facility] ENDOSCOPY;  Service: Endoscopy;  Laterality: N/A;   EXPLORATORY LAPAROTOMY  1968   "I think they left my appendix"   PAP SMEAR  2012 AND 2017    Family History: Family History  Problem Relation Age of Onset   Cancer Mother        BREAST   Hypertension Mother    Macular degeneration Mother    Heart attack Father    Diabetes Father    Colon polyps Brother    Colon polyps Brother    Colon polyps Brother    Colon polyps Brother    Colon polyps Brother    Colon polyps Brother     Social History  reports that she  has been smoking cigarettes. She has a 20 pack-year smoking history. She has never been exposed to tobacco smoke. She has never used smokeless tobacco. She reports current alcohol use. She reports that she does not use drugs.  Allergies  Allergen Reactions   Nsaids Swelling    Hands and legs swell   Penicillins Hives    Has patient had a PCN reaction causing immediate rash, facial/tongue/throat swelling, SOB or lightheadedness with hypotension: Yes Has patient had a PCN reaction causing severe rash involving mucus membranes or skin necrosis: No Has patient had a PCN reaction that required hospitalization: No Has patient had a PCN reaction occurring within the last 10 years: No If all of the above answers are "NO", then may proceed with Cephalosporin use.    Quinapril Hcl Other (See Comments)    Other reaction(s): elevated potassium and creatinine    Medications   Current Facility-Administered Medications:    insulin aspart protamine- aspart (NOVOLOG MIX 70/30) injection 60 Units, 60 Units, Subcutaneous, Once, Trifan, Kermit Balo, MD  Current Outpatient Medications:    albuterol (PROVENTIL HFA;VENTOLIN HFA) 108 (90 Base) MCG/ACT inhaler, Inhale 2 puffs into the lungs every 4 (four) hours as needed for wheezing or shortness of breath., Disp: , Rfl:    allopurinol (ZYLOPRIM) 100 MG tablet, Take 100 mg by mouth daily., Disp: , Rfl:    amLODipine (NORVASC) 5 MG tablet, Take 5 mg by mouth every morning., Disp: , Rfl:    apixaban (ELIQUIS) 5 MG TABS tablet, TAKE 1 TABLET(5 MG) BY MOUTH TWICE DAILY, Disp: 180 tablet, Rfl: 3   atorvastatin (LIPITOR) 40 MG tablet, Take 1 tablet (40 mg total) by mouth at bedtime., Disp: 90 tablet, Rfl: 3   bisoprolol (ZEBETA) 5 MG tablet, TAKE 1 TABLET(5 MG) BY MOUTH DAILY, Disp: 90 tablet, Rfl: 3   Cyanocobalamin (VITAMIN B-12 IJ), Inject 1 mL as directed every 30 (thirty) days. , Disp: , Rfl:    escitalopram (LEXAPRO) 10 MG tablet, Take 10 mg by mouth as needed.,  Disp: , Rfl:    ezetimibe (ZETIA) 10 MG tablet, Take 1 tablet (10 mg total) by mouth daily., Disp: 90 tablet, Rfl: 3   ferrous sulfate 325 (65 FE) MG tablet, Take 1 tablet (325 mg total) by mouth daily with breakfast., Disp: 30 tablet, Rfl: 3   Fluticasone-Umeclidin-Vilant (TRELEGY ELLIPTA) 100-62.5-25 MCG/ACT AEPB, INHALE 1 PUFF INTO THE LUNGS DAILY, Disp: 60 each, Rfl: 9   folic acid (FOLVITE) 1 MG tablet, Take 1 tablet (1  mg total) by mouth daily., Disp: 30 tablet, Rfl: 0   HYDROcodone-acetaminophen (NORCO/VICODIN) 5-325 MG tablet, Take 1 tablet by mouth every 6 (six) hours as needed for moderate pain., Disp: , Rfl:    insulin NPH-regular Human (70-30) 100 UNIT/ML injection, Inject 6 Units into the skin 2 (two) times daily with a meal. Inject 30 units into the skin in the morning and 70 units into the skin in the evening, Disp: 10 mL, Rfl: 11   Insulin Syringe-Needle U-100 31G X 5/16" 1 ML MISC, by Does not apply route., Disp: , Rfl:    loratadine (CLARITIN) 10 MG tablet, Take 10 mg by mouth daily., Disp: , Rfl:    metolazone (ZAROXOLYN) 2.5 MG tablet, Take 2.5 mg by mouth daily as needed., Disp: , Rfl:    Omega-3 Fatty Acids (FISH OIL) 1000 MG CAPS, Take 1 capsule by mouth daily., Disp: , Rfl:    pantoprazole (PROTONIX) 40 MG tablet, Take 40 mg by mouth daily., Disp: , Rfl:    tiZANidine (ZANAFLEX) 4 MG tablet, Take 4 mg by mouth 3 (three) times daily., Disp: , Rfl:    torsemide (DEMADEX) 20 MG tablet, 60mg  (3 tablets) in the morning and 20mg  (1 tablet) in the afternoon, Disp: , Rfl:   Vitals   Vitals:   August 02, 2023 0921 08/02/23 1256 August 02, 2023 1749 2023/08/02 1750  BP: 137/73 (!) 155/69  (!) 165/46  Pulse: 82 62  64  Resp: 17 15  (!) 22  Temp: 98.1 F (36.7 C) 97.9 F (36.6 C) 98 F (36.7 C)   TempSrc: Oral     SpO2: 100% 100%  100%  Weight: 80.3 kg     Height: 5\' 3"  (1.6 m)       Body mass index is 31.35 kg/m.  Physical Exam  GENERAL: Awake, alert in NAD HEENT: - Normocephalic and  atraumatic, dry mm, no LN++, no Thyromegally LUNGS - Clear to auscultation bilaterally with no wheezes CV - S1S2 RRR, no m/r/g, equal pulses bilaterally. ABDOMEN - Soft, nontender, nondistended with normoactive BS Extremities: Edema and redness up to above mid calf with small ulcers on both great toes NEURO:  Mental Status: AA&Ox3 Speech and Language: speech is fluent and nondysarthric.  Naming, repetition, fluency, and comprehension intact. Cranial Nerves: PERRL. EOMI, visual fields full, no facial asymmetry, facial sensation intact, hearing intact, tongue/uvula/soft palate midline, normal sternocleidomastoid and trapezius muscle strength. No evidence of tongue atrophy or fibrillations Motor: Antigravity in all fours Tone: is normal and bulk is normal Sensation- Intact to light touch bilaterally Coordination: FTN intact bilaterally, no ataxia in BLE. Gait- deferred   Labs/Imaging/Neurodiagnostic studies   CBC:  Recent Labs  Lab 08-02-23 0934  WBC 10.7*  HGB 12.6  HCT 39.4  MCV 99.2  PLT 226   Basic Metabolic Panel:  Lab Results  Component Value Date   NA 131 (L) 08/02/23   K 4.4 2023-08-02   CO2 28 2023/08/02   GLUCOSE 676 (HH) August 02, 2023   BUN 21 08-02-2023   CREATININE 1.91 (H) 08/02/2023   CALCIUM 9.0 2023/08/02   GFRNONAA 28 (L) 08/02/2023   GFRAA 21 (L) 11/29/2019   Lipid Panel:  Lab Results  Component Value Date   LDLCALC 40 07/31/2021   HgbA1c:  Lab Results  Component Value Date   HGBA1C 7.1 (H) 11/23/2019   Urine Drug Screen: No results found for: "LABOPIA", "COCAINSCRNUR", "LABBENZ", "AMPHETMU", "THCU", "LABBARB"  Alcohol Level No results found for: "ETH" INR  Lab Results  Component Value  Date   INR 2.2 (H) 11/23/2019   APTT  Lab Results  Component Value Date   APTT 45 (H) 11/28/2019    CT Head without contrast(Personally reviewed): 3 mm hyperdensity in the pons-indeterminate follow small recent hemorrhage or mineralization-recommend MRI  for further evaluation  MRI Brain without contrast (Personally reviewed): IMPRESSION: 1. Ill-defined restricted diffusion within the central pons with a striated T2 and FLAIR appearance. 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 A demyelinating process could have a similar appearance. Neoplasm is considered less likely. Postcontrast imaging would be helpful to further delineate the diagnosis. 2. 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 Flow is present in the supraclinoid ICA, A1 and M1 segments. Flow is present in the left internal carotid artery in the posterior circulation. 3. The supratentorial brain is within normal limits.  MR brain with contrast completed-no abnormal enhancement  ASSESSMENT   Paula Wheeler is a 73 y.o. female past history of diabetes hypertension hyperlipidemia persistent A-fib on Eliquis COPD presenting for evaluation of frequent falls in the last few days and a headache and head fullness along with dizziness ongoing for a few weeks to a month.  MRI brain with an ill-defined area of restricted diffusion within the central pons with T2/FLAIR striated appearance more consistent with osmotic demyelination. Sugars extremely high. Likely metabolic reasons for osmotic demyelination is the most likely possibility. Has a chronic right ICA stenosis.  On the MRI looks like there might be an occlusion of the ICA at the skull base.  No strokes-occlusion also likely chronic. At this time needs therapy assessments and management of metabolic derangements-hyperglycemia. Because of the differentials of imaging mentioning stroke, will complete an echocardiogram and a carotid Dopplers as well as MRA of the head without contrast.  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  No need of adding antiplatelet at this time MRA head without contrast Carotid Dopplers 2D echo A1c-goal less than 7-management per primary team Lipid panel-LDL goal less than 70.  Currently on atorvastatin 40.  Management per primary team Therapy assessments Management of pedal edema and toe wound per primary team We will follow the imaging results above with you. Plan was relayed to admitting Dr. Gasper Sells  ______________________________________________________________________    Signed, Milon Dikes, MD [facility]

## 2023-07-17 ENCOUNTER — Inpatient Hospital Stay (HOSPITAL_COMMUNITY)

## 2023-07-17 DIAGNOSIS — G939 Disorder of brain, unspecified: Secondary | ICD-10-CM | POA: Diagnosis not present

## 2023-07-17 DIAGNOSIS — Z794 Long term (current) use of insulin: Secondary | ICD-10-CM

## 2023-07-17 DIAGNOSIS — Z720 Tobacco use: Secondary | ICD-10-CM

## 2023-07-17 DIAGNOSIS — E1165 Type 2 diabetes mellitus with hyperglycemia: Secondary | ICD-10-CM | POA: Diagnosis not present

## 2023-07-17 DIAGNOSIS — I10 Essential (primary) hypertension: Secondary | ICD-10-CM | POA: Diagnosis not present

## 2023-07-17 DIAGNOSIS — I639 Cerebral infarction, unspecified: Secondary | ICD-10-CM | POA: Diagnosis not present

## 2023-07-17 LAB — CBG MONITORING, ED
Glucose-Capillary: 301 mg/dL — ABNORMAL HIGH (ref 70–99)
Glucose-Capillary: 238 mg/dL — ABNORMAL HIGH (ref 70–99)

## 2023-07-17 LAB — BASIC METABOLIC PANEL
Anion gap: 9 (ref 5–15)
BUN: 16 mg/dL (ref 8–23)
CO2: 25 mmol/L (ref 22–32)
Calcium: 8.1 mg/dL — ABNORMAL LOW (ref 8.9–10.3)
Chloride: 102 mmol/L (ref 98–111)
Creatinine, Ser: 1.57 mg/dL — ABNORMAL HIGH (ref 0.44–1.00)
GFR, Estimated: 35 mL/min — ABNORMAL LOW (ref >60)
Glucose, Bld: 308 mg/dL — ABNORMAL HIGH (ref 70–99)
Potassium: 3.1 mmol/L — ABNORMAL LOW (ref 3.5–5.1)
Sodium: 136 mmol/L (ref 135–145)

## 2023-07-17 LAB — CBC
HCT: 31.5 % — ABNORMAL LOW (ref 36.0–46.0)
Hemoglobin: 10.3 g/dL — ABNORMAL LOW (ref 12.0–15.0)
MCH: 31 pg (ref 26.0–34.0)
MCHC: 32.7 g/dL (ref 30.0–36.0)
MCV: 94.9 fL (ref 80.0–100.0)
Platelets: 217 10*3/uL (ref 150–400)
RBC: 3.32 MIL/uL — ABNORMAL LOW (ref 3.87–5.11)
RDW: 14.4 % (ref 11.5–15.5)
WBC: 8.1 10*3/uL (ref 4.0–10.5)
nRBC: 0 % (ref 0.0–0.2)

## 2023-07-17 LAB — LIPID PANEL
Cholesterol: 149 mg/dL (ref 0–200)
HDL: 28 mg/dL — ABNORMAL LOW (ref >40)
LDL Cholesterol: 95 mg/dL (ref 0–99)
Total CHOL/HDL Ratio: 5.3 ratio
Triglycerides: 131 mg/dL (ref <150)
VLDL: 26 mg/dL (ref 0–40)

## 2023-07-17 LAB — GLUCOSE, CAPILLARY
Glucose-Capillary: 323 mg/dL — ABNORMAL HIGH (ref 70–99)
Glucose-Capillary: 367 mg/dL — ABNORMAL HIGH (ref 70–99)
Glucose-Capillary: 284 mg/dL — ABNORMAL HIGH (ref 70–99)

## 2023-07-17 LAB — TSH: TSH: 0.896 u[IU]/mL (ref 0.350–4.500)

## 2023-07-17 LAB — MRSA NEXT GEN BY PCR, NASAL: MRSA by PCR Next Gen: NOT DETECTED

## 2023-07-17 MED ORDER — ONDANSETRON HCL 4 MG/2ML IJ SOLN: 4.0000 mg | Freq: Four times a day (QID) | INTRAMUSCULAR | Status: DC | PRN

## 2023-07-17 MED ORDER — AMLODIPINE BESYLATE 5 MG PO TABS
5.0000 mg | ORAL_TABLET | Freq: Every morning | ORAL | Status: DC
  Administered 2023-07-17 – 2023-07-18 (×2): 5 mg via ORAL
  Filled 2023-07-17 (×2): qty 1

## 2023-07-17 MED ORDER — INSULIN GLARGINE 100 UNITS/ML SOLOSTAR PEN: 30.0000 [IU] | PEN_INJECTOR | Freq: Every day | SUBCUTANEOUS | Status: DC

## 2023-07-17 MED ORDER — UMECLIDINIUM BROMIDE 62.5 MCG/ACT IN AEPB
1.0000 | INHALATION_SPRAY | Freq: Every day | RESPIRATORY_TRACT | Status: DC
  Administered 2023-07-17: 1 via RESPIRATORY_TRACT
  Filled 2023-07-17: qty 7

## 2023-07-17 MED ORDER — ACETAMINOPHEN 500 MG PO TABS: 500.0000 mg | ORAL_TABLET | Freq: Four times a day (QID) | ORAL | Status: DC | PRN

## 2023-07-17 MED ORDER — HYDROCODONE-ACETAMINOPHEN 5-325 MG PO TABS: 1.0000 | ORAL_TABLET | ORAL | Status: DC | PRN

## 2023-07-17 MED ORDER — ALLOPURINOL 100 MG PO TABS
100.0000 mg | ORAL_TABLET | Freq: Every day | ORAL | Status: DC
  Administered 2023-07-17 – 2023-07-18 (×2): 100 mg via ORAL
  Filled 2023-07-17 (×2): qty 1

## 2023-07-17 MED ORDER — INSULIN GLARGINE 100 UNIT/ML ~~LOC~~ SOLN
30.0000 [IU] | Freq: Every day | SUBCUTANEOUS | Status: DC
  Administered 2023-07-17: 30 [IU] via SUBCUTANEOUS
  Filled 2023-07-17 (×2): qty 0.3

## 2023-07-17 MED ORDER — FOLIC ACID 1 MG PO TABS
1.0000 mg | ORAL_TABLET | Freq: Every day | ORAL | Status: DC
  Administered 2023-07-17 – 2023-07-18 (×2): 1 mg via ORAL
  Filled 2023-07-17 (×2): qty 1

## 2023-07-17 MED ORDER — APIXABAN 5 MG PO TABS
5.0000 mg | ORAL_TABLET | Freq: Two times a day (BID) | ORAL | Status: DC
  Administered 2023-07-17 – 2023-07-18 (×4): 5 mg via ORAL
  Filled 2023-07-17 (×4): qty 1

## 2023-07-17 MED ORDER — ONDANSETRON HCL 4 MG PO TABS: 4.0000 mg | ORAL_TABLET | Freq: Four times a day (QID) | ORAL | Status: DC | PRN

## 2023-07-17 MED ORDER — INSULIN ASPART 100 UNIT/ML IJ SOLN
0.0000 [IU] | Freq: Three times a day (TID) | INTRAMUSCULAR | Status: DC
  Administered 2023-07-17: 2 [IU] via SUBCUTANEOUS
  Administered 2023-07-17: 4 [IU] via SUBCUTANEOUS
  Administered 2023-07-17: 5 [IU] via SUBCUTANEOUS
  Administered 2023-07-18 (×2): 2 [IU] via SUBCUTANEOUS

## 2023-07-17 MED ORDER — ACETAMINOPHEN 650 MG RE SUPP: 650.0000 mg | Freq: Four times a day (QID) | RECTAL | Status: DC | PRN

## 2023-07-17 MED ORDER — PANTOPRAZOLE SODIUM 40 MG PO TBEC
40.0000 mg | DELAYED_RELEASE_TABLET | Freq: Every day | ORAL | Status: DC
  Administered 2023-07-17 – 2023-07-18 (×2): 40 mg via ORAL
  Filled 2023-07-17 (×2): qty 1

## 2023-07-17 MED ORDER — SODIUM CHLORIDE 0.9 % IV SOLN: INTRAVENOUS | Status: AC

## 2023-07-17 MED ORDER — FLUTICASONE FUROATE-VILANTEROL 100-25 MCG/ACT IN AEPB
1.0000 | INHALATION_SPRAY | Freq: Every day | RESPIRATORY_TRACT | Status: DC
  Administered 2023-07-17 – 2023-07-18 (×2): 1 via RESPIRATORY_TRACT
  Filled 2023-07-17 (×3): qty 28

## 2023-07-17 NOTE — Evaluation (Signed)
 Clinical/Bedside Swallow Evaluation Patient Details  Name: Paula Wheeler MRN: [id number] Date of Birth: 12/18/1950  Today's Date: 07/17/2023 Time: SLP Start Time (ACUTE ONLY): 1008 SLP Stop Time (ACUTE ONLY): 1018 SLP Time Calculation (min) (ACUTE ONLY): 10 min  Past Medical History:  Past Medical History:  Diagnosis Date   Anemia    COPD (chronic obstructive pulmonary disease) (HCC)    Dyslipidemia    Exogenous obesity    GERD (gastroesophageal reflux disease)    GI bleed 05/14/2016   History of abnormal cells from cervix    Hyperlipidemia    Hypertension    Iron deficiency anemia    Longstanding persistent atrial fibrillation (HCC)    Microalbuminuria    Tobacco dependence    Type II diabetes mellitus (HCC)    Past Surgical History:  Past Surgical History:  Procedure Laterality Date   CARDIOVERSION N/A 08/07/2016   Procedure: CARDIOVERSION;  Surgeon: Vesta Mixer, MD;  Location: [facility] ENDOSCOPY;  Service: Cardiovascular;  Laterality: N/A;   DIAGNOSTIC MAMMOGRAM  05/2015   ESOPHAGOGASTRODUODENOSCOPY N/A 05/15/2016   Procedure: ESOPHAGOGASTRODUODENOSCOPY (EGD);  Surgeon: Graylin Shiver, MD;  Location: [facility] ENDOSCOPY;  Service: Endoscopy;  Laterality: N/A;   EXPLORATORY LAPAROTOMY  1968   "I think they left my appendix"   PAP SMEAR  2012 AND 2017   HPI:  Paula Wheeler is a 73 y.o. female who presented to ED because she is having severe unsteadiness with standing and has fallen 5 times at home lately. She reports recent head cold with sinus infection which has not responded to abx.  Since this she has felt like her head isn't clear and her speech sounds like it is in a tunnel. MRI suggestive of demyelination process in central pons. No chest imaging available.  Pt with medical history significant for hypertension, type 2 diabetes mellitus, hyperlipidemia, and persistent atrial fibrillation on Eliquis.    Assessment / Plan / Recommendation  Clinical Impression  Pt presents  with functional swallowing as assessed clinically.  Pt tolerated all consistencies trialed, including straw sips of thin liquid, without any clinical s/s of aspiration. She exhibited good clearance of solids.  Pt has no further ST needs for swallowing. SLP will sign off.   Recommend continuing regular texture diet with thin liquids.  SLP Visit Diagnosis: Dysphagia, unspecified (R13.10)    Aspiration Risk  No limitations    Diet Recommendation Regular;Thin liquid    Liquid Administration via: Cup;Straw Medication Administration: Whole meds with liquid Supervision: Patient able to self feed Compensations: Slow rate;Small sips/bites Postural Changes: Seated upright at 90 degrees    Other  Recommendations Oral Care Recommendations: Oral care BID    Recommendations for follow up therapy are one component of a multi-disciplinary discharge planning process, led by the attending physician.  Recommendations may be updated based on patient status, additional functional criteria and insurance authorization.  Follow up Recommendations No SLP follow up (for swallowing)      Assistance Recommended at Discharge  N/A  Functional Status Assessment Patient has not had a recent decline in their functional status (for swallowing)  Frequency and Duration  (N/A)          Prognosis Prognosis for improved oropharyngeal function:  (N/A)      Swallow Study   General Date of Onset: 07/16/23 HPI: Paula Wheeler is a 73 y.o. female who presented to ED because she is having severe unsteadiness with standing and has fallen 5 times at home lately. She reports recent  head cold with sinus infection which has not responded to abx.  Since this she has felt like her head isn't clear and her speech sounds like it is in a tunnel. MRI suggestive of demyelination process in central pons. No chest imaging available.  Pt with medical history significant for hypertension, type 2 diabetes mellitus, hyperlipidemia, and  persistent atrial fibrillation on Eliquis. Type of Study: Bedside Swallow Evaluation Previous Swallow Assessment: none Diet Prior to this Study: Regular;Thin liquids (Level 0) Temperature Spikes Noted: No History of Recent Intubation: No Behavior/Cognition: Alert;Cooperative;Pleasant mood Oral Cavity Assessment: Within Functional Limits Oral Care Completed by SLP: No Oral Cavity - Dentition: Dentures, bottom;Edentulous Vision: Functional for self-feeding Self-Feeding Abilities: Able to feed self Patient Positioning: Upright in bed Baseline Vocal Quality: Normal Volitional Cough: Strong Volitional Swallow: Able to elicit    Oral/Motor/Sensory Function Overall Oral Motor/Sensory Function: Within functional limits Facial ROM: Within Functional Limits Facial Symmetry: Within Functional Limits Facial Strength: Within Functional Limits Lingual ROM: Within Functional Limits Lingual Symmetry: Within Functional Limits Lingual Strength: Within Functional Limits Velum: Within Functional Limits Mandible: Within Functional Limits   Ice Chips Ice chips: Not tested   Thin Liquid Thin Liquid: Within functional limits Presentation: Straw    Nectar Thick Nectar Thick Liquid: Not tested   Honey Thick Honey Thick Liquid: Not tested   Puree Puree: Within functional limits Presentation: Spoon   Solid     Solid: Within functional limits Presentation: Self Fed      Kerrie Pleasure, MA, CCC-SLP Acute Rehabilitation Services Office: [phone] 07/17/2023,11:24 AM

## 2023-07-17 NOTE — Plan of Care (Signed)

## 2023-07-17 NOTE — H&P (Addendum)
 History and Physical    Patient: Paula Wheeler NWG:[id number] DOB: Nov 16, 1950 DOA: 07/16/2023 DOS: the patient was seen and examined on 07/17/2023 PCP: Daisy Floro, MD  Patient coming from: Home  Chief Complaint:  Chief Complaint  Patient presents with   Weakness   Fall   HPI: Paula Wheeler is a 73 y.o. female with medical history significant for hypertension, type 2 diabetes mellitus, hyperlipidemia, and persistent atrial fibrillation on Eliquis presents because she is having severe unsteadiness with standing and has fallen 5 times at home lately.  The patient lives alone.  She is still working and drives and was in her usual state of health until a couple of weeks ago.  She says she had a cold and following that she developed this pressure in her head and feeling like she was hearing everything in the tunnel.  Her primary doctor prescribed her antibiotics in case this was a sinus infection, but they did not help.  She had another round of antibiotics and steroids which also did not help.  The dizziness started after the round of antibiotics and steroids and it is getting worse.  The patient does not feel comfortable driving she is having a hard time just walking in her house so she came in for evaluation.   Review of Systems: As mentioned in the history of present illness. All other systems reviewed and are negative. Past Medical History:  Diagnosis Date   Anemia    COPD (chronic obstructive pulmonary disease) (HCC)    Dyslipidemia    Exogenous obesity    GERD (gastroesophageal reflux disease)    GI bleed 05/14/2016   History of abnormal cells from cervix    Hyperlipidemia    Hypertension    Iron deficiency anemia    Longstanding persistent atrial fibrillation (HCC)    Microalbuminuria    Tobacco dependence    Type II diabetes mellitus (HCC)    Past Surgical History:  Procedure Laterality Date   CARDIOVERSION N/A 08/07/2016   Procedure: CARDIOVERSION;  Surgeon:  Vesta Mixer, MD;  Location: [facility] ENDOSCOPY;  Service: Cardiovascular;  Laterality: N/A;   DIAGNOSTIC MAMMOGRAM  05/2015   ESOPHAGOGASTRODUODENOSCOPY N/A 05/15/2016   Procedure: ESOPHAGOGASTRODUODENOSCOPY (EGD);  Surgeon: Graylin Shiver, MD;  Location: [facility] ENDOSCOPY;  Service: Endoscopy;  Laterality: N/A;   EXPLORATORY LAPAROTOMY  1968   "I think they left my appendix"   PAP SMEAR  2012 AND 2017   Social History:  reports that she has been smoking cigarettes. She has a 20 pack-year smoking history. She has never been exposed to tobacco smoke. She has never used smokeless tobacco. She reports current alcohol use. She reports that she does not use drugs.  Allergies  Allergen Reactions   Nsaids Swelling    Hands and legs swell   Penicillins Hives    Has patient had a PCN reaction causing immediate rash, facial/tongue/throat swelling, SOB or lightheadedness with hypotension: Yes Has patient had a PCN reaction causing severe rash involving mucus membranes or skin necrosis: No Has patient had a PCN reaction that required hospitalization: No Has patient had a PCN reaction occurring within the last 10 years: No If all of the above answers are "NO", then may proceed with Cephalosporin use.    Quinapril Hcl Other (See Comments)    Other reaction(s): elevated potassium and creatinine    Family History  Problem Relation Age of Onset   Cancer Mother        BREAST  Hypertension Mother    Macular degeneration Mother    Heart attack Father    Diabetes Father    Colon polyps Brother    Colon polyps Brother    Colon polyps Brother    Colon polyps Brother    Colon polyps Brother    Colon polyps Brother     Prior to Admission medications   Medication Sig Start Date End Date Taking? Authorizing Provider  acetaminophen (TYLENOL) 500 MG tablet Take 500 mg by mouth every 6 (six) hours as needed for mild pain (pain score 1-3) or moderate pain (pain score 4-6).   Yes [provider]   albuterol (PROVENTIL HFA;VENTOLIN HFA) 108 (90 Base) MCG/ACT inhaler Inhale 2 puffs into the lungs every 4 (four) hours as needed for wheezing or shortness of breath.   Yes [provider]  allopurinol (ZYLOPRIM) 100 MG tablet Take 100 mg by mouth in the morning.   Yes [provider]  amLODipine (NORVASC) 5 MG tablet Take 5 mg by mouth every morning. 04/26/20  Yes [provider]  apixaban (ELIQUIS) 5 MG TABS tablet TAKE 1 TABLET(5 MG) BY MOUTH TWICE DAILY 07/29/22  Yes Nahser, Deloris Ping, MD  atorvastatin (LIPITOR) 40 MG tablet Take 1 tablet (40 mg total) by mouth at bedtime. 08/06/21  Yes Nahser, Deloris Ping, MD  bisoprolol (ZEBETA) 5 MG tablet TAKE 1 TABLET(5 MG) BY MOUTH DAILY Patient taking differently: Take 5 mg by mouth in the morning. 07/30/22  Yes Nahser, Deloris Ping, MD  cefdinir (OMNICEF) 300 MG capsule Take 300 mg by mouth 2 (two) times daily. 07/07/23  Yes [provider]  Cyanocobalamin (B-12 PO) Take 1 tablet by mouth in the morning.   Yes [provider]  escitalopram (LEXAPRO) 10 MG tablet Take 10 mg by mouth as needed.   Yes [provider]  ezetimibe (ZETIA) 10 MG tablet Take 1 tablet (10 mg total) by mouth daily. Patient taking differently: Take 10 mg by mouth in the morning. 07/29/22  Yes Nahser, Deloris Ping, MD  ferrous sulfate 325 (65 FE) MG tablet Take 1 tablet (325 mg total) by mouth daily with breakfast. 11/30/19  Yes Regalado, Belkys A, MD  Fluticasone-Umeclidin-Vilant (TRELEGY ELLIPTA) 100-62.5-25 MCG/ACT AEPB INHALE 1 PUFF INTO THE LUNGS DAILY Patient taking differently: Inhale 1 puff into the lungs in the morning. 01/23/23  Yes Dewald, Bettina Gavia, MD  folic acid (FOLVITE) 1 MG tablet Take 1 tablet (1 mg total) by mouth daily. Patient taking differently: Take 1 mg by mouth in the morning. 11/30/19  Yes Regalado, Belkys A, MD  HYDROcodone-acetaminophen (NORCO/VICODIN) 5-325 MG tablet Take 1 tablet by mouth every 6 (six) hours as needed for  moderate pain.   Yes [provider]  insulin NPH-regular Human (70-30) 100 UNIT/ML injection Inject 6 Units into the skin 2 (two) times daily with a meal. Inject 30 units into the skin in the morning and 70 units into the skin in the evening Patient taking differently: Inject 30-60 Units into the skin 2 (two) times daily with a meal. Inject 30 units into the skin in the morning and 60 units into the skin in the evening 11/29/19  Yes Regalado, Belkys A, MD  loratadine (CLARITIN) 10 MG tablet Take 10 mg by mouth in the morning.   Yes [provider]  metolazone (ZAROXOLYN) 2.5 MG tablet Take 2.5 mg by mouth daily as needed. 07/05/22  Yes [provider]  Omega-3 Fatty Acids (FISH OIL) 1000 MG CAPS Take  1 capsule by mouth in the morning.   Yes [provider]  pantoprazole (PROTONIX) 40 MG tablet Take 40 mg by mouth in the morning. 01/16/20  Yes [provider]  tiZANidine (ZANAFLEX) 4 MG tablet Take 4 mg by mouth as needed for muscle spasms. 07/12/22  Yes [provider]  torsemide (DEMADEX) 20 MG tablet 60mg  (3 tablets) in the morning and 20mg  (1 tablet) in the afternoon   Yes [provider]  Insulin Syringe-Needle U-100 31G X 5/16" 1 ML MISC by Does not apply route.    [provider]  meclizine (ANTIVERT) 12.5 MG tablet Take 1-2 tablets by mouth as needed for dizziness or nausea. 07/15/23   [provider]    Physical Exam: Vitals:   07/16/23 1749 07/16/23 1750 07/16/23 2136 07/16/23 2354  BP:  (!) 165/46  (!) 167/84  Pulse:  64  62  Resp:  (!) 22  20  Temp: 98 F (36.7 C)  98 F (36.7 C)   TempSrc:   Oral   SpO2:  100%  100%  Weight:      Height:       Physical Exam:  General: No acute distress,chronically ill appearing HEENT: Normocephalic, atraumatic, PERRL Cardiovascular: Normal rate and rhythm. Distal pulses intact. Pulmonary: Normal pulmonary effort, mild exp wheezing posteriorly Gastrointestinal:  Nondistended abdomen, soft, non-tender, normoactive bowel sounds Musculoskeletal:no lower ext edema, right great toe abrasion Skin: Skin is warm and dry. Neuro: No focal deficits noted.  Patient's gait was not examined. AAOx3. PSYCH: Attentive and cooperative  Data Reviewed:  Results for orders placed or performed during the hospital encounter of 07/16/23 (from the past 24 hours)  Basic metabolic panel     Status: Abnormal   Collection Time: 07/16/23  9:34 AM  Result Value Ref Range   Sodium 131 (L) 135 - 145 mmol/L   Potassium 4.4 3.5 - 5.1 mmol/L   Chloride 94 (L) 98 - 111 mmol/L   CO2 28 22 - 32 mmol/L   Glucose, Bld 676 (HH) 70 - 99 mg/dL   BUN 21 8 - 23 mg/dL   Creatinine, Ser 0.86 (H) 0.44 - 1.00 mg/dL   Calcium 9.0 8.9 - 57.8 mg/dL   GFR, Estimated 28 (L) >60 mL/min   Anion gap 9 5 - 15  CBC     Status: Abnormal   Collection Time: 07/16/23  9:34 AM  Result Value Ref Range   WBC 10.7 (H) 4.0 - 10.5 K/uL   RBC 3.97 3.87 - 5.11 MIL/uL   Hemoglobin 12.6 12.0 - 15.0 g/dL   HCT 46.9 62.9 - 52.8 %   MCV 99.2 80.0 - 100.0 fL   MCH 31.7 26.0 - 34.0 pg   MCHC 32.0 30.0 - 36.0 g/dL   RDW 41.3 24.4 - 01.0 %   Platelets 226 150 - 400 K/uL   nRBC 0.0 0.0 - 0.2 %  CBG monitoring, ED     Status: Abnormal   Collection Time: 07/16/23  9:40 AM  Result Value Ref Range   Glucose-Capillary >600 (HH) 70 - 99 mg/dL  Urinalysis, Routine w reflex microscopic -Urine, Clean Catch     Status: Abnormal   Collection Time: 07/16/23  3:00 PM  Result Value Ref Range   Color, Urine YELLOW YELLOW   APPearance HAZY (A) CLEAR   Specific Gravity, Urine 1.021 1.005 - 1.030   pH 6.0 5.0 - 8.0   Glucose, UA >=500 (A) NEGATIVE mg/dL   Hgb urine dipstick  SMALL (A) NEGATIVE   Bilirubin Urine NEGATIVE NEGATIVE   Ketones, ur NEGATIVE NEGATIVE mg/dL   Protein, ur NEGATIVE NEGATIVE mg/dL   Nitrite NEGATIVE NEGATIVE   Leukocytes,Ua LARGE (A) NEGATIVE   RBC / HPF >50 0 - 5 RBC/hpf   WBC, UA 21-50 0 - 5  WBC/hpf   Bacteria, UA RARE (A) NONE SEEN   Squamous Epithelial / HPF 0-5 0 - 5 /HPF   Budding Yeast PRESENT   POC CBG, ED     Status: Abnormal   Collection Time: 07/16/23  7:52 PM  Result Value Ref Range   Glucose-Capillary 413 (H) 70 - 99 mg/dL  CBG monitoring, ED     Status: Abnormal   Collection Time: 07/16/23 11:53 PM  Result Value Ref Range   Glucose-Capillary 328 (H) 70 - 99 mg/dL   Comment 1 Notify RN    Comment 2 Document in Chart     MRI Brain IMPRESSION: 1. Ill-defined restricted diffusion within the central pons with a striated T2 and FLAIR appearance. 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 A demyelinating process could have a similar appearance. Neoplasm is considered less likely. Postcontrast imaging would be helpful to further delineate the diagnosis. 2. 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 Flow is present in the supraclinoid ICA, A1 and M1 segments. Flow is present in the left internal carotid artery in the posterior circulation. 3. The supratentorial brain is within normal limits.   Assessment and Plan: Ataxia and sounding like she's in a tunnel / Abnormal MRI w demyelinating lesion- appreciate neurology assessment and recommendations as follows:  Admit to hospitalist Frequent rechecks Telemetry Continue home Eliquis from a stroke prevention standpoint.  No need of adding antiplatelet at this time MRA head without contrast Carotid Dopplers 2D echo A1c-goal less than 7-management per primary team Lipid panel-LDL goal less than 70.  Currently on atorvastatin 40.  Management per primary team Therapy assessments  2. DMT2 with hyperglycemia - the patient just finished her steroids 4 days ago.  Some of this hyperglycemia may be from the steroids.  Neurology has requested that the blood sugars be treated aggressively because of the demyelinating lesion found. She  received subcu insulin x 1 in the emergency department and IV fluids.  Her blood pressure has continued to improve with no further intervention.  Will continue to monitor.  She is very worried about hypoglycemia so we will continue to monitor.  3. The patient's sinuses are clear per MRI so no further therpies needed.   4.  Atrial fibrillation - continue Eliquis  5.  Small abrasion on right great toe from fall -x-ray unremarkable.  Supportive care  6.  Feeling of fullness and pressure in her ears -initially she thought she might have a sinus infection but the MRI is negative for sinus abnormalities.  Will try to lavage her ears tonight.   Advance Care Planning:   Code Status: Prior The patient names her brother Dorinda Hill as her Runner, broadcasting/film/video and would like to be full code.  Consults: Neurology  Family Communication: none   Severity of Illness: The appropriate patient status for this patient is INPATIENT. Inpatient status is judged to be reasonable and necessary in order to provide the required intensity of service to ensure the patient's safety. The patient's presenting symptoms, physical exam findings, and initial radiographic and laboratory data in the context of their chronic comorbidities is felt to place them  at high risk for further clinical deterioration. Furthermore, it is not anticipated that the patient will be medically stable for discharge from the hospital within 2 midnights of admission.   * I certify that at the point of admission it is my clinical judgment that the patient will require inpatient hospital care spanning beyond 2 midnights from the point of admission due to high intensity of service, high risk for further deterioration and high frequency of surveillance required.*  Author: Buena Irish, MD 07/17/2023 1:36 AM  For on call review [email or web address].

## 2023-07-17 NOTE — Inpatient Diabetes Management (Addendum)
 Inpatient Diabetes Program Recommendations  AACE/ADA: New Consensus Statement on Inpatient Glycemic Control (2015)  Target Ranges:  Prepandial:   less than 140 mg/dL      Peak postprandial:   less than 180 mg/dL (1-2 hours)      Critically ill patients:  140 - 180 mg/dL   Lab Results  Component Value Date   GLUCAP 323 (H) 07/17/2023   HGBA1C 7.1 (H) 11/23/2019    Review of Glycemic Control  Latest Reference Range & Units 07/16/23 09:40 07/16/23 19:52 07/16/23 23:53 07/17/23 03:36 07/17/23 07:45 07/17/23 12:04  Glucose-Capillary 70 - 99 mg/dL >409 (HH) 811 (H) 914 (H) 301 (H) 238 (H) 323 (H)  (HH): Data is critically high (H): Data is abnormally high  Diabetes history: DM2 Outpatient Diabetes medications: Novolin 70/30 30 units QAM, 60 units QHS Current orders for Inpatient glycemic control: Novolog 0-6 units TID  Inpatient Diabetes Program Recommendations:    Please consider:  Lantus 30 units at bedtime (50% of home basal insulin). Please consider obtaining a current A1C.  Spoke with patient and confirmed she takes Novolin 70/30 30 units QAM, and 60 units QPM.  PCP is Dr. Tenny Craw with [facility].  She thinks her last A1C was 8% in December.    Will continue to follow while inpatient.  Thank you, Dulce Sellar, MSN, CDCES Diabetes Coordinator Inpatient Diabetes Program [phone] (team pager from 8a-5p)

## 2023-07-17 NOTE — ED Notes (Signed)
 Pt CBG 323

## 2023-07-17 NOTE — Evaluation (Signed)
 Physical Therapy Evaluation Patient Details Name: Paula Wheeler MRN: [id number] DOB: 03-31-1951 Today's Date: 07/17/2023  History of Present Illness  Pt is a 73 y.o. female presenting 07/16/23 with severe unsteadiness with standing and hx of at least x5 falls recently. 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 PMH DM2, HLD, anemia, GERD, HTN, A-fib  Clinical Impression  Pt is a 73 y.o. female presenting on 07/16/23 with above conditions and deficits below, see PT Problem List. PTA pt lived independently alone in a townhouse with no stairs to enter and began using a RW for some home and community navigation in January of 2025 s/t increased dizziness. Currently, pt is CGA-supervision for bed mobility and CGA for transfers and gait. Additionally, pt displayed direction changing nystagmus and reported symptoms with smooth pursuit and Dix-Hallpike testing. Pt displays deficits in activity tolerance, dynamic and static balance, gait, and functional mobility and would benefit from continued acute PT to address these impairments. Given the pt's PLOF, anticipated progress, available family assistance, and motivation to improve, pt would benefit from follow-up HHPT upon d/c. Pt would benefit from continued mobility with nurses and mobility specialists until d/c. Will continue to follow acutely.             If plan is discharge home, recommend the following: A little help with walking and/or transfers;A little help with bathing/dressing/bathroom;Assistance with cooking/housework;Assist for transportation   Can travel by private vehicle        Equipment Recommendations None recommended by PT  Recommendations for Other Services       Functional Status Assessment Patient has had a recent decline in their functional status and demonstrates the ability to make significant improvements in function in a  reasonable and predictable amount of time.     Precautions / Restrictions Precautions Precautions: Fall Recall of Precautions/Restrictions: Intact Restrictions Weight Bearing Restrictions Per Provider Order: No      Mobility  Bed Mobility Overal bed mobility: Needs Assistance Bed Mobility: Supine to Sit, Sit to Supine     Supine to sit: Contact guard, Supervision, HOB elevated, Used rails Sit to supine: Min assist, Mod assist   General bed mobility comments: Pt supervision beginning supine to sit. CGA for HHA to help pt get out of the dip in the bed. Min-mod A for proper sequencing cueing and LE management with sit to supine. Pt required mod Ax2 to scoot superioly to proper final positioning using bed pad.    Transfers Overall transfer level: Needs assistance Equipment used: Rolling walker (2 wheels) Transfers: Sit to/from Stand Sit to Stand: Contact guard assist, From elevated surface           General transfer comment: pt CGA for safety for sit to stand from elevated surface. Required cueing for hand positioning on RW    Ambulation/Gait Ambulation/Gait assistance: Contact guard assist Gait Distance (Feet): 50 Feet Assistive device: Rolling walker (2 wheels) Gait Pattern/deviations: Step-through pattern, Decreased stride length, Shuffle, Drifts right/left, Trunk flexed Gait velocity: reduced Gait velocity interpretation: 1.31 - 2.62 ft/sec, indicative of limited community ambulator   General Gait Details: pt walks with increased cervical and trunk flexion s/t kyphosis and required cueing to maintain upright trunk posture and visual fixation on a target if dizziness symptoms began.  Stairs            Wheelchair Mobility     Tilt Bed    Modified Rankin (  Stroke Patients Only)       Balance Overall balance assessment: Needs assistance Sitting-balance support: Single extremity supported, No upper extremity supported, Feet supported Sitting balance-Leahy  Scale: Good Sitting balance - Comments: pt sits from elevated surface with hands in lap or single extremity resting on bed. No LOB   Standing balance support: Bilateral upper extremity supported, Reliant on assistive device for balance, During functional activity Standing balance-Leahy Scale: Poor Standing balance comment: reliant on RW for support. No LOB, increased trunk flexion             High level balance activites: Head turns, Sudden stops               Pertinent Vitals/Pain Pain Assessment Pain Assessment: No/denies pain    Home Living Family/patient expects to be discharged to:: Private residence Living Arrangements: Alone Available Help at Discharge: Family;Available 24 hours/day Type of Home: House Home Access: Other (comment) (threshold)       Home Layout: One level Home Equipment: Rolling Walker (2 wheels);Grab bars - toilet;Grab bars - tub/shower;Shower seat;Cane - single point      Prior Function Prior Level of Function : Independent/Modified Independent;Working/employed             Mobility Comments: has been using RW over the past year ADLs Comments: brother has helped with some ADLs for the past couple weeks. normally indep     Extremity/Trunk Assessment   Upper Extremity Assessment Upper Extremity Assessment: Defer to OT evaluation    Lower Extremity Assessment Lower Extremity Assessment: Generalized weakness;RLE deficits/detail;LLE deficits/detail RLE Deficits / Details: unable to fully flex hips against gravity. Can fully extend knees and ankles against gravity RLE Sensation: history of peripheral neuropathy RLE Coordination: decreased gross motor LLE Deficits / Details: unable to fully flex hips against gravity. Can fully extend knees and ankles against gravity LLE Sensation: history of peripheral neuropathy LLE Coordination: decreased gross motor    Cervical / Trunk Assessment Cervical / Trunk Assessment: Kyphotic  Communication    Communication Communication: No apparent difficulties Factors Affecting Communication: Other (comment) (slightly hard of hearing)    Cognition Arousal: Alert Behavior During Therapy: WFL for tasks assessed/performed   PT - Cognitive impairments: No apparent impairments                         Following commands: Intact       Cueing Cueing Techniques: Verbal cues     General Comments General comments (skin integrity, edema, etc.): With vestibular testing, pt displayed direction changing nystagmus with smooth pursuit and L Dix-Hallpick. Pt complained of dizziness with rapid head turns, Dix-Hallpike positioning and with supine to sit    Exercises     Assessment/Plan    PT Assessment Patient needs continued PT services  PT Problem List Decreased strength;Decreased range of motion;Decreased activity tolerance;Decreased balance;Decreased mobility;Decreased coordination;Cardiopulmonary status limiting activity;Impaired sensation       PT Treatment Interventions DME instruction;Gait training;Stair training;Functional mobility training;Therapeutic activities;Therapeutic exercise;Balance training;Neuromuscular re-education;Patient/family education    PT Goals (Current goals can be found in the Care Plan section)  Acute Rehab PT Goals Patient Stated Goal: improve dizziness PT Goal Formulation: With patient Time For Goal Achievement: 07/31/23 Potential to Achieve Goals: Good    Frequency Min 2X/week     Co-evaluation               AM-PAC PT "6 Clicks" Mobility  Outcome Measure Help needed turning from your back to your side  while in a flat bed without using bedrails?: A Little Help needed moving from lying on your back to sitting on the side of a flat bed without using bedrails?: A Little Help needed moving to and from a bed to a chair (including a wheelchair)?: A Little Help needed standing up from a chair using your arms (e.g., wheelchair or bedside chair)?:  A Little Help needed to walk in hospital room?: A Little Help needed climbing 3-5 steps with a railing? : A Little 6 Click Score: 18    End of Session Equipment Utilized During Treatment: Gait belt Activity Tolerance: Patient tolerated treatment well Patient left: in bed;with call bell/phone within reach Nurse Communication: Mobility status;Other (comment) (Pt's cardiac monitor was not showing up on the screen) PT Visit Diagnosis: Unsteadiness on feet (R26.81);Other abnormalities of gait and mobility (R26.89);Muscle weakness (generalized) (M62.81);History of falling (Z91.81);Difficulty in walking, not elsewhere classified (R26.2);Other symptoms and signs involving the nervous system (R29.898);Dizziness and giddiness (R42)    Time: 1610-9604 PT Time Calculation (min) (ACUTE ONLY): 40 min   Charges:   PT Evaluation $PT Eval Moderate Complexity: 1 Mod PT Treatments $Gait Training: 8-22 mins $Therapeutic Activity: 8-22 mins PT General Charges $$ ACUTE PT VISIT: 1 Visit        [address], SPT    [place] 07/17/2023, 9:58 AM

## 2023-07-17 NOTE — Progress Notes (Addendum)
   07/17/23 1417  TOC Brief Assessment  Insurance and Status Reviewed (BCBS COMM PPO)  Patient has primary care physician Yes Tenny Craw, Darlen Round, MD)  Home environment has been reviewed lives alone Brother is 40 minutes away and there are neighbors close by if needed  Prior level of function: fairly independent Uses RW/tub bench and raised toilet seat  Prepares food and has groceries delivered  Prior/Current Home Services No current home services (Has used [facility] in the past and wants to be reconnected)  Social Drivers of Health Review SDOH reviewed interventions complete (Smoking cessation information attached to dc instructions)  Readmission risk has been reviewed  (18%)  Transition of care needs transition of care needs identified, TOC will continue to follow   Smoking cessation information attached to DC instructions  Patient reconnected to [facility].  Will need home health orders at DC.   Patient states one of her Brothers will take her home and stay with her possibly a day or two until she gets settled   TOC will continue to follow patient for any additional discharge needs

## 2023-07-17 NOTE — Evaluation (Signed)
 Speech Language Pathology Evaluation Patient Details Name: Paula Wheeler MRN: [id number] DOB: 18-Oct-1950 Today's Date: 07/17/2023 Time: 4098-1191 SLP Time Calculation (min) (ACUTE ONLY): 19 min  Problem List:  Patient Active Problem List   Diagnosis Date Noted   Brain lesion 07/16/2023   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 04/07/2023   Carotid artery disease (HCC) 08/06/2022   Pain due to onychomycosis of toenails of both feet 05/27/2022   Pressure injury of skin 11/28/2019   Acute renal failure (ARF) (HCC) 11/23/2019   Lactic acidosis    Hyperkalemia    Acute encephalopathy    Sepsis (HCC)    Shock circulatory (HCC)    Acute renal failure superimposed on stage 4 chronic kidney disease (HCC) 01/29/2018   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   GERD (gastroesophageal reflux disease) 01/29/2018   Depression 01/29/2018   Tobacco abuse 01/29/2018   Elevated troponin 01/29/2018   Leukocytosis 01/29/2018   Chronic diastolic CHF (congestive heart failure) (HCC) 01/29/2018   CKD (chronic kidney disease) stage 4, GFR 15-29 ml/min (HCC)    SOB (shortness of breath)    PAD (peripheral artery disease) (HCC) 12/23/2017   Persistent atrial fibrillation 06/10/2016   GI bleed 05/14/2016   Symptomatic anemia 05/14/2016   Diabetes mellitus with complication (HCC) 05/14/2016   Essential hypertension 05/14/2016   HLD (hyperlipidemia) 05/14/2016   Anemia    Renal insufficiency    Past Medical History:  Past Medical History:  Diagnosis Date   Anemia    COPD (chronic obstructive pulmonary disease) (HCC)    Dyslipidemia    Exogenous obesity    GERD (gastroesophageal reflux disease)    GI bleed 05/14/2016   History of abnormal cells from cervix    Hyperlipidemia    Hypertension    Iron deficiency anemia    Longstanding persistent atrial fibrillation (HCC)    Microalbuminuria    Tobacco dependence    Type II diabetes mellitus (HCC)    Past Surgical  History:  Past Surgical History:  Procedure Laterality Date   CARDIOVERSION N/A 08/07/2016   Procedure: CARDIOVERSION;  Surgeon: Vesta Mixer, MD;  Location: [facility] ENDOSCOPY;  Service: Cardiovascular;  Laterality: N/A;   DIAGNOSTIC MAMMOGRAM  05/2015   ESOPHAGOGASTRODUODENOSCOPY N/A 05/15/2016   Procedure: ESOPHAGOGASTRODUODENOSCOPY (EGD);  Surgeon: Graylin Shiver, MD;  Location: [facility] ENDOSCOPY;  Service: Endoscopy;  Laterality: N/A;   EXPLORATORY LAPAROTOMY  1968   "I think they left my appendix"   PAP SMEAR  2012 AND 2017   HPI:  [name] is a 73 y.o. female who presented to ED because she is having severe unsteadiness with standing and has fallen 5 times at home lately. She reports recent head cold with sinus infection which has not responded to abx.  Since this she has felt like her head isn't clear and her speech sounds like it is in a tunnel. MRI suggestive of demyelination process in central pons. No chest imaging available.  Pt with medical history significant for hypertension, type 2 diabetes mellitus, hyperlipidemia, and persistent atrial fibrillation on Eliquis.   Assessment / Plan / Recommendation Clinical Impression  Pt presents with mild cognitive linguistic deficits. She was assessed using the COGNSTAT (see below for additional information) and performed within the average range on all subtests administered except for memory.  There was a mild impairment in memory but pt does not feel that this reflects a deficit.  She has noted no changes to functional memory.  She has been working without difficulty.  Pt's visuospatial ability was assessed using a clock drawing.  Clock was correctly laid out but with repetition of numbers 10 and 11.  Pt placed hour hand correctly, but not minute hand.    Pt's primary complaint is that her speech sounds different to her.  She feels like her voice sounds like she is a tunnel.  To her, her voice sounds like the echo one would hear in this setting  (she does not hear an echo, she merely feels her voice has that perceptual quality).  She reports clients have noticed a change to her speech as well.  She has told them she has a head cold when asked.  Pt's speech is clear and she is 100% intelligible.  Resonance was [facility]. OME was grossly normal.  Pt has had a recent sinus infection which coincided with her speech symptoms. Question whether this is changing the way her voice sounds to her, but outside comments suggest others have appreciated change as well.  SLP will follow briefly to address above noted deficits.    Recommend outpatient ST if symptoms persist.  COGNISTAT: All subtests are within the average range, except where otherwise specified.  Orientation:  12/12 Attention: 8/8 Comprehension: 5/6 Repetition: 11/12 Naming: 7/8 Construction: clock drawing as noted above Memory: 8/12, mild impairment Calculations: 4/4 Similarities: 5/8 Judgment: 5/6     SLP Assessment  SLP Recommendation/Assessment: Patient needs continued Speech Lanaguage Pathology Services SLP Visit Diagnosis: Dysarthria and anarthria (R47.1);Cognitive communication deficit (R41.841)    Recommendations for follow up therapy are one component of a multi-disciplinary discharge planning process, led by the attending physician.  Recommendations may be updated based on patient status, additional functional criteria and insurance authorization.    Follow Up Recommendations  Outpatient SLP (Continue ST at next level of care)    Assistance Recommended at Discharge   TBD  Functional Status Assessment Patient has had a recent decline in their functional status and demonstrates the ability to make significant improvements in function in a reasonable and predictable amount of time.  Frequency and Duration min 2x/week  2 weeks      SLP Evaluation Cognition  Overall Cognitive Status: Within Functional Limits for tasks assessed Orientation Level: Oriented X4 Year:  2025 Month: March Day of Week: Correct Attention: Focused;Sustained Focused Attention: Appears intact Sustained Attention: Appears intact Memory: Impaired Memory Impairment: Decreased short term memory Awareness: Appears intact Problem Solving: Appears intact Executive Function: Reasoning Reasoning: Appears intact       Comprehension  Auditory Comprehension Overall Auditory Comprehension: Appears within functional limits for tasks assessed Commands: Within Functional Limits Conversation: Complex Visual Recognition/Discrimination Discrimination: Not tested Reading Comprehension Reading Status: Not tested    Expression Expression Primary Mode of Expression: Verbal Verbal Expression Overall Verbal Expression: Appears within functional limits for tasks assessed Initiation: No impairment Level of Generative/Spontaneous Verbalization: Conversation Repetition: No impairment Naming: No impairment Pragmatics: No impairment   Oral / Motor  Oral Motor/Sensory Function Overall Oral Motor/Sensory Function: Within functional limits Facial ROM: Within Functional Limits Facial Symmetry: Within Functional Limits Facial Strength: Within Functional Limits Lingual ROM: Within Functional Limits Lingual Symmetry: Within Functional Limits Lingual Strength: Within Functional Limits Velum: Within Functional Limits Mandible: Within Functional Limits            Kerrie Pleasure, MA, CCC-SLP Acute Rehabilitation Services Office: [phone] 07/17/2023, 11:19 AM

## 2023-07-17 NOTE — ED Notes (Signed)
 Howerter, DO informed about pt bradycardia and updated pt's CBG. Pt stable. POC: continue to monitor

## 2023-07-17 NOTE — Progress Notes (Addendum)
 PROGRESS NOTE    Paula Wheeler  WGN:[id number] DOB: 01/03/1951 DOA: 07/16/2023 PCP: Daisy Floro, MD   Brief Narrative:  Care started prior to midnight in the emergency room the patient was admitted early this morning after midnight by Dr. Buena Irish and I am in current agreement with her assessment and plan.  Additional changes to the plan of care made accordingly.  The patient is an obese Caucasian female with a past medical history significant for but not limited to HTN, HLD, DMT2, history of persistent atrial fibrillation on anticoagulation with Eliquis, COPD and other comorbidities who presented for evaluation of frequent falls the last few days as well as a headache and head fullness as well as dizziness that has been persistent for about a month.    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  Currently she is undergoing further workup and because of the differentials on imaging mentioning CVA a complete CVA workup is being pursued.  Neurology is recommending continuing anticoagulant with Eliquis from a stroke prevention standpoint and recommended no antiplatelet needed at this time are recommending carotid Dopplers which is done in a 2D echo which is pending.  PT/OT recommending home health and she will need aggressive control of her hyperglycemia.  Diabetes education coordinator been consulted and we will add Lantus 30 units subcu daily.  As of now we will continue IV fluid hydration, follow-up on echocardiogram and neurology recommendations and anticipate discharge in next 24 to 48 hours if cleared by Neurology and if workup is negative.   DVT prophylaxis:  apixaban (ELIQUIS) tablet 5 mg    Code Status: Full Code Family Communication: No family present at bedside  Disposition Plan:  Level of care: Telemetry  Medical Status is: Inpatient Remains inpatient appropriate because: Needs to have W/U complete, clearance by Neurology   Consultants:  Neurology  Procedures:  Carotid U/S ECHOCardiogram  Antimicrobials:  Anti-infectives (From admission, onward)    None       Objective: Vitals:   07/17/23 1215 07/17/23 1230 07/17/23 1259 07/17/23 1300  BP: (!) 153/73 (!) 153/71  (!) 163/64  Pulse:   66 76  Resp: (!) 22 (!) 22 (!) 25 (!) 23  Temp:   98.7 F (37.1 C)   TempSrc:   Oral   SpO2:   94% 96%  Weight:      Height:        Intake/Output Summary (Last 24 hours) at 07/17/2023 1404 Last data filed at 07/17/2023 1300 Gross per 24 hour  Intake 1258.28 ml  Output --  Net 1258.28 ml   Filed Weights   07/16/23 0921  Weight: 80.3 kg  Data Reviewed: I have personally reviewed following labs and imaging studies  CBC: Recent Labs  Lab 07/16/23 0934 07/17/23 0504  WBC 10.7* 8.1  HGB 12.6 10.3*  HCT 39.4 31.5*  MCV 99.2 94.9  PLT 226 217   Basic Metabolic Panel: Recent Labs  Lab 07/16/23 0934 07/17/23 0504  NA 131* 136  K 4.4 3.1*  CL 94* 102  CO2 28 25  GLUCOSE 676* 308*  BUN 21 16  CREATININE 1.91* 1.57*  CALCIUM 9.0 8.1*   GFR: Estimated Creatinine Clearance: 32.5 mL/min (A) (by C-G formula based on SCr of 1.57 mg/dL (H)). Liver Function Tests: No results for input(s): "AST", "ALT", "ALKPHOS", "BILITOT", "PROT", "ALBUMIN" in the last  168 hours. No results for input(s): "LIPASE", "AMYLASE" in the last 168 hours. No results for input(s): "AMMONIA" in the last 168 hours. Coagulation Profile: No results for input(s): "INR", "PROTIME" in the last 168 hours. Cardiac Enzymes: No results for input(s): "CKTOTAL", "CKMB", "CKMBINDEX", "TROPONINI" in the last 168 hours. BNP (last 3 results) No results for input(s): "PROBNP" in the last 8760 hours. HbA1C: No results for input(s): "HGBA1C" in the last 72 hours. CBG: Recent Labs  Lab 07/16/23 1952 07/16/23 2353  07/17/23 0336 07/17/23 0745 07/17/23 1204  GLUCAP 413* 328* 301* 238* 323*   Lipid Profile: Recent Labs    07/17/23 0505  CHOL 149  HDL 28*  LDLCALC 95  TRIG 578  CHOLHDL 5.3   Thyroid Function Tests: Recent Labs    07/17/23 0505  TSH 0.896   Anemia Panel: No results for input(s): "VITAMINB12", "FOLATE", "FERRITIN", "TIBC", "IRON", "RETICCTPCT" in the last 72 hours. Sepsis Labs: No results for input(s): "PROCALCITON", "LATICACIDVEN" in the last 168 hours.  No results found for this or any previous visit (from the past 240 hours).   Radiology Studies: MR ANGIO HEAD WO CONTRAST Result Date: 07/16/2023 CLINICAL DATA:  Initial evaluation for acute TIA. EXAM: MRA HEAD WITHOUT CONTRAST TECHNIQUE: Angiographic images of the Circle of Willis were acquired using MRA technique without intravenous contrast. COMPARISON:  Comparison made with MRIs from earlier the same day. FINDINGS: Anterior circulation: Right ICA is occluded within the upper neck, and remains occluded to the terminus. Mild atheromatous irregularity about the cavernous left ICA without hemodynamically significant stenosis. A1 segments patent bilaterally. Right A1 segment diminutive. Normal anterior communicating artery complex. Anterior cerebral arteries patent without stenosis. Distal reconstitution at the right ICA terminus related contralateral flow across the circle-of-Willis. Both M1 segments are patent. No proximal MCA branch occlusion or high-grade stenosis. Distal MCA branches perfused and symmetric. Posterior circulation: Both V4 segments patent without significant stenosis. Right vertebral artery slightly dominant. Left PICA patent. Right PICA not well seen. Basilar patent without stenosis. Superior cerebellar arteries patent bilaterally. Both PCAs primarily supplied via the basilar. Small right posterior communicating artery noted. Both PCAs patent to their distal aspects without significant stenosis. Anatomic variants:  As above. Other: No intracranial aneurysm. IMPRESSION: 1. 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 Distal reconstitution via collateral flow across the circle-of-Willis with perfusion of the right MCA. 2. Otherwise negative intracranial MRA. No other large vessel occlusion or hemodynamically significant stenosis. Electronically Signed   By: Rise Mu M.D.   On: 07/16/2023 23:28   MR BRAIN W CONTRAST Result Date: 07/16/2023 CLINICAL DATA:  Initial evaluation for demyelinating disease. EXAM: MRI HEAD WITH CONTRAST TECHNIQUE: Multiplanar, multiecho pulse sequences of the brain and surrounding structures were obtained with intravenous contrast. CONTRAST:  8mL GADAVIST GADOBUTROL 1 MMOL/ML IV SOLN COMPARISON:  Noncontrast brain MRI from earlier the same day. FINDINGS: Brain: Previously identified signal abnormality involving the central pons again noted. Following contrast administration, no abnormal or pathologic enhancement seen within the brain. No other new abnormality. Vascular: Normal intravascular enhancement seen within the major intracranial vascular structures. Skull and upper cervical spine: No new finding. Sinuses/Orbits: No new finding. Other: None. IMPRESSION: No abnormal enhancement within the brain. Electronically Signed   By: Rise Mu M.D.   On: 07/16/2023 20:43   DG Toe Great Right Result Date: 07/16/2023 CLINICAL DATA:  Osteomyelitis EXAM: RIGHT GREAT TOE COMPARISON:  None Available. FINDINGS: Frontal, oblique, and lateral views of the right first digit  are obtained on 5 total images. There are no acute or destructive bony abnormalities. No periosteal reaction. 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 There is diffuse soft tissue swelling of the first digit and dorsal margin of the forefoot. No evidence of subcutaneous gas or radiopaque foreign body. IMPRESSION: 1. Soft tissue swelling of the forefoot and first digit. No radiographic  evidence of osteomyelitis. 2. 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 Electronically Signed   By: Sharlet Salina M.D.   On: 07/16/2023 17:52   MR BRAIN WO CONTRAST Result Date: 07/16/2023 CLINICAL DATA:  Abnormal CT scan. Headache. Neuro deficit. Generalized weakness. Dizziness. Recent falls. EXAM: MRI HEAD WITHOUT CONTRAST TECHNIQUE: Multiplanar, multiecho pulse sequences of the brain and surrounding structures were obtained without intravenous contrast. COMPARISON:  CT head without contrast 07/16/2023. FINDINGS: Brain: Somewhat ill-defined restricted diffusion is present within the central pons. A striated T2 and FLAIR appearance is present. No expansion of the brainstem or mass effect is evident. No focal susceptibility is present to suggest hemorrhage or calcification. The supratentorial brain is otherwise within normal limits. No acute infarct, hemorrhage or mass lesion is present in the supratentorial brain. No significant white matter lesions are present. Deep brain nuclei are within normal limits. Insert normal ventricles No significant extraaxial fluid collection is present. Cerebellum is unremarkable. The internal auditory canals are within normal limits. 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 Flow is present in the supraclinoid ICA, A1 and M1 segments. Flow is present in the left internal carotid artery in the posterior circulation. Skull and upper cervical spine: The brainstem and cerebellum are within normal limits. Sinuses/Orbits: The paranasal sinuses and mastoid air cells are clear. The globes and orbits are within normal limits. IMPRESSION: 1. Ill-defined restricted diffusion within the central pons with a striated T2 and FLAIR appearance. 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 A demyelinating process could have a similar appearance. Neoplasm is considered less likely.  Postcontrast imaging would be helpful to further delineate the diagnosis. 2. 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 Flow is present in the supraclinoid ICA, A1 and M1 segments. Flow is present in the left internal carotid artery in the posterior circulation. 3. The supratentorial brain is within normal limits. These results were called by telephone at the time of interpretation on 07/16/2023 at 5:36 pm to provider Dr. Renaye Rakers, who verbally acknowledged these results. Electronically Signed   By: Marin Roberts M.D.   On: 07/16/2023 17:37   CT HEAD WO CONTRAST Result Date: 07/16/2023 CLINICAL DATA:  Headache, neuro deficit. Generalized weakness, head fullness, and dizziness with multiple recent falls. EXAM: CT HEAD WITHOUT CONTRAST TECHNIQUE: Contiguous axial images were obtained from the base of the skull through the vertex without intravenous contrast. RADIATION DOSE REDUCTION: This exam was performed according to the departmental dose-optimization program which includes automated exposure control, adjustment of the mA and/or kV according to patient size and/or use of iterative reconstruction technique. COMPARISON:  Neck CT 10/03/2022 FINDINGS: Brain: There is no evidence of an acute infarct, mass, midline shift, or extra-axial fluid collection. Cerebral volume is within normal limits for age. The ventricles are normal in size. There is a 3 mm hyperdense focus centrally in the pons without associated edema which appears new from the prior neck CT and could reflect recent hemorrhage or mineralization. There is no evidence of acute intracranial hemorrhage elsewhere. Vascular: Calcified atherosclerosis at the  skull base. No hyperdense vessel. Skull: No acute fracture or suspicious lesion. Sinuses/Orbits: Partial opacification of a posterior right ethmoid air cell. Minimal bilateral mastoid air cell opacification. Unremarkable orbits. Other: None. IMPRESSION: 1. 3 mm hyperdensity in the  pons, indeterminate for a small recent hemorrhage or mineralization. Recommend brain MRI for further evaluation. 2. No other evidence of acute intracranial abnormality. Electronically Signed   By: Sebastian Ache M.D.   On: 07/16/2023 11:43   Scheduled Meds:  allopurinol  100 mg Oral Daily   amLODipine  5 mg Oral q morning   apixaban  5 mg Oral BID   fluticasone furoate-vilanterol  1 puff Inhalation Daily   And   umeclidinium bromide  1 puff Inhalation Daily   folic acid  1 mg Oral Daily   insulin aspart  0-6 Units Subcutaneous TID WC   insulin glargine  30 Units Subcutaneous QHS   pantoprazole  40 mg Oral Daily   Continuous Infusions:  sodium chloride 125 mL/hr at 07/17/23 1300    LOS: 1 day   Marguerita Merles, DO [facility] Available via Epic secure chat 7am-7pm After these hours, please refer to coverage provider listed on [email or web address] 07/17/2023, 2:04 PM

## 2023-07-17 NOTE — Progress Notes (Signed)
 [facility] night cross cover note:   I was notified by RN that the patient's heart rate, which has been the 50s while awake, decreases into the high 30s while asleep.  Upon awakening, the patient has remained asymptomatic, and without evidence of hypotension, with most recent systolic blood pressures in the 150's mmHg. In atrial fibrillation in the setting of a history of persistent atrial fibrillation. Home BB has been held thus far. As the patient is asymptomatic and with stable BP's, will continue to monitor HR for now.   Also, presenting hyperglycemia continues to improve with IVF's. Initial CBG was greater than 600 yesterday afternoon, prompting Novolog 10 units SQ x 1 dose at that time. Since then, the patient has received no additional insulin, but rather has been on continuous IV fluids, with ensuing CBG results sequentially trending down: 413 --> 328 --> 301. Will continue to monitor trend in blood sugar via existing orders for scheduled accuchecks.      Newton Pigg, DO Hospitalist

## 2023-07-17 NOTE — Hospital Course (Addendum)
 Care started prior to midnight in the emergency room the patient was admitted early this morning after midnight by Dr. Buena Irish and I am in current agreement with her assessment and plan.  Additional changes to the plan of care made accordingly.  The patient is an obese Caucasian female with a past medical history significant for but not limited to HTN, HLD, DMT2, history of persistent atrial fibrillation on anticoagulation with Eliquis, COPD and other comorbidities who presented for evaluation of frequent falls the last few days as well as a headache and head fullness as well as dizziness that has been persistent for about a month.    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  Currently she is undergoing further workup and because of the differentials on imaging mentioning CVA a complete CVA workup is being pursued.  Neurology is recommending continuing anticoagulant with Eliquis from a stroke prevention standpoint and recommended no antiplatelet needed at this time are recommending carotid Dopplers which is done in a 2D echo which is pending.  PT/OT recommending home health and she will need aggressive control of her hyperglycemia.  Diabetes education coordinator been consulted and we will add Lantus 30 units subcu daily.  As of now we will continue IV fluid hydration, follow-up on echocardiogram and neurology recommendations and anticipate discharge in next 24 to 48 hours if cleared by Neurology and if workup is negative.

## 2023-07-18 ENCOUNTER — Inpatient Hospital Stay (HOSPITAL_COMMUNITY)

## 2023-07-18 ENCOUNTER — Other Ambulatory Visit: Payer: Self-pay | Admitting: Cardiovascular Disease

## 2023-07-18 DIAGNOSIS — I6523 Occlusion and stenosis of bilateral carotid arteries: Secondary | ICD-10-CM

## 2023-07-18 DIAGNOSIS — I10 Essential (primary) hypertension: Secondary | ICD-10-CM

## 2023-07-18 DIAGNOSIS — E782 Mixed hyperlipidemia: Secondary | ICD-10-CM

## 2023-07-18 DIAGNOSIS — I4819 Other persistent atrial fibrillation: Secondary | ICD-10-CM

## 2023-07-18 DIAGNOSIS — J449 Chronic obstructive pulmonary disease, unspecified: Secondary | ICD-10-CM

## 2023-07-18 DIAGNOSIS — G939 Disorder of brain, unspecified: Secondary | ICD-10-CM | POA: Diagnosis not present

## 2023-07-18 LAB — HEMOGLOBIN A1C
Hgb A1c MFr Bld: >15.5 % — ABNORMAL HIGH (ref 4.8–5.6)
Mean Plasma Glucose: >398 mg/dL

## 2023-07-18 LAB — ECHOCARDIOGRAM COMPLETE
AR max vel: 1.85 cm2
AV Peak grad: 12.1 mmHg
Ao pk vel: 1.74 m/s
Area-P 1/2: 5.54 cm2
Height: 63 in
MV M vel: 3.49 m/s
MV Peak grad: 48.7 mmHg
S' Lateral: 3.3 cm
Weight: 2832 [oz_av]

## 2023-07-18 LAB — GLUCOSE, CAPILLARY
Glucose-Capillary: 242 mg/dL — ABNORMAL HIGH (ref 70–99)
Glucose-Capillary: 231 mg/dL — ABNORMAL HIGH (ref 70–99)

## 2023-07-18 MED ORDER — METOPROLOL TARTRATE 5 MG/5ML IV SOLN: 5.0000 mg | INTRAVENOUS | Status: DC | PRN

## 2023-07-18 MED ORDER — HYDRALAZINE HCL 20 MG/ML IJ SOLN: 10.0000 mg | INTRAMUSCULAR | Status: DC | PRN

## 2023-07-18 MED ORDER — EZETIMIBE 10 MG PO TABS
10.0000 mg | ORAL_TABLET | Freq: Every day | ORAL | Status: DC
  Administered 2023-07-18: 10 mg via ORAL
  Filled 2023-07-18: qty 1

## 2023-07-18 MED ORDER — IPRATROPIUM-ALBUTEROL 0.5-2.5 (3) MG/3ML IN SOLN: 3.0000 mL | RESPIRATORY_TRACT | Status: DC | PRN

## 2023-07-18 MED ORDER — BISOPROLOL FUMARATE 5 MG PO TABS
5.0000 mg | ORAL_TABLET | Freq: Every morning | ORAL | Status: DC
  Administered 2023-07-18: 5 mg via ORAL
  Filled 2023-07-18 (×2): qty 1

## 2023-07-18 MED ORDER — POTASSIUM CHLORIDE CRYS ER 20 MEQ PO TBCR
40.0000 meq | EXTENDED_RELEASE_TABLET | Freq: Once | ORAL | Status: AC
  Administered 2023-07-18: 40 meq via ORAL
  Filled 2023-07-18: qty 2

## 2023-07-18 MED ORDER — SENNOSIDES-DOCUSATE SODIUM 8.6-50 MG PO TABS: 1.0000 | ORAL_TABLET | Freq: Every evening | ORAL | Status: DC | PRN

## 2023-07-18 MED ORDER — GLUCAGON HCL RDNA (DIAGNOSTIC) 1 MG IJ SOLR: 1.0000 mg | INTRAMUSCULAR | Status: DC | PRN

## 2023-07-18 MED ORDER — ATORVASTATIN CALCIUM 80 MG PO TABS: 80.0000 mg | ORAL_TABLET | Freq: Every day | ORAL | 0 refills | Status: DC

## 2023-07-18 NOTE — Discharge Summary (Signed)
 Physician Discharge Summary  Paula Wheeler WJX:[id number] DOB: [birth year] DOA: 07/16/2023  PCP: Daisy Floro, MD  Admit date: 07/16/2023 Discharge date: 07/18/2023  Admitted From: Home Disposition: Home  Recommendations for Outpatient Follow-up:  Follow up with PCP in 1-2 weeks Please obtain BMP/CBC in one week your next doctors visit.  Should for follow-up with outpatient neurology and vascular within next 2-4 weeks Continue taking p.o. Eliquis at home Lipitor increased to 80 mg daily Recommended to stay compliant with her medication regimen especially insulin Would benefit from referral to endocrinology During follow-up should also discuss possible continuous glucose monitoring such as libre with her PCP   Discharge Condition: Stable CODE STATUS: Full code Diet recommendation: Diabetic  Brief/Interim Summary: Brief Narrative:  73 year old with history of DM2, HTN, HLD, persistent A-fib on Eliquis, COPD presented to the hospital with frequent falls, dizziness for about a month.  Workup showed concerns of pontine demyelinating lesion/central pontine melanosis which neurology suspect is secondary to metabolic derangements given hypoglycemia.  Other differentials could be CVA, neurology recommending continuing Eliquis but no antiplatelets at this time. Eventually patient was cleared by neurology, echocardiogram was overall unremarkable.  Recommend outpatient follow-up with vascular.  A1c greater than 15.5 and she would benefit from outpatient close follow-up with her PCP and possibly get endocrinology referral.  Assessment & Plan:  Principal Problem:   Brain lesion   Pontine osmotic demyelination Dizziness and headache/ambulatory dysfunction - There is no clear evidence of CVA at this time therefore we will continue Eliquis without antiplatelet treatment.  MRA showed occlusion of right ICA.  MRI brain is negative for any acute CVA.  LDL 95, A1c >15.5 -  Echocardiogram-preserved EF - Vascular consult-patient declined.  She wants to follow-up outpatient with Dr. Chestine Spore  Hypokalemia -As needed repletion  Bacteriuria - Denying any symptoms.  Can follow-up outpatient  Insulin-dependent diabetes mellitus type 2, brittle -At home 70/30 insulin.  Currently on Lantus and sliding scale.  Will further adjust as necessary.  A1c is currently pending  Essential hypertension - On Norvasc.  IV as needed  Hyperlipidemia - LDL 95.  Continue Zetia, Lipitor increased to 80 mg daily.  COPD - Bronchodilators.  Persistent atrial fibrillation - On Eliquis.  Continue bisoprolol  DVT prophylaxis: apixaban (ELIQUIS) tablet 5 mg     Code Status: Full Code Family Communication:   Status is: Inpatient Remains inpatient appropriate because: DC today if Echo is stable. Cleared by Neuro   Subjective:  Patient is adamant she wants to go home today.  Does not want any further inpatient treatment including her intracranial stenosis and vascular evaluation. She tells me she feels back to her baseline.  Examination:  General exam: Appears calm and comfortable  Respiratory system: Clear to auscultation. Respiratory effort normal. Cardiovascular system: S1 & S2 heard, RRR. No JVD, murmurs, rubs, gallops or clicks. No pedal edema. Gastrointestinal system: Abdomen is nondistended, soft and nontender. No organomegaly or masses felt. Normal bowel sounds heard. Central nervous system: Alert and oriented. No focal neurological deficits. Extremities: Symmetric 5 x 5 power. Skin: No rashes, lesions or ulcers Psychiatry: Judgement and insight appear normal. Mood & affect appropriate.    Discharge Diagnoses:  Principal Problem:   Brain lesion      Discharge Exam: Vitals:   07/18/23 0838 07/18/23 1200  BP: (!) 154/70 113/63  Pulse: 74   Resp: 20   Temp: 97.7 F (36.5 C)   SpO2: 95%    Vitals:   07/18/23 0000 07/18/23 0400  07/18/23 0838 07/18/23 1200   BP: (!) 145/62 (!) 153/63 (!) 154/70 113/63  Pulse: 79 (!) 56 74   Resp: (!) 23 19 20    Temp: 97.9 F (36.6 C) 97.7 F (36.5 C) 97.7 F (36.5 C)   TempSrc: Oral Oral Oral   SpO2: 96% 96% 95%   Weight:      Height:          Discharge Instructions   Allergies as of 07/18/2023       Reactions   Nsaids Swelling   Hands and legs swell   Penicillins Hives   Has patient had a PCN reaction causing immediate rash, facial/tongue/throat swelling, SOB or lightheadedness with hypotension: Yes Has patient had a PCN reaction causing severe rash involving mucus membranes or skin necrosis: No Has patient had a PCN reaction that required hospitalization: No Has patient had a PCN reaction occurring within the last 10 years: No If all of the above answers are "NO", then may proceed with Cephalosporin use.   Quinapril Hcl Other (See Comments)   Other reaction(s): elevated potassium and creatinine        Medication List     STOP taking these medications    cefdinir 300 MG capsule Commonly known as: OMNICEF       TAKE these medications    acetaminophen 500 MG tablet Commonly known as: TYLENOL Take 500 mg by mouth every 6 (six) hours as needed for mild pain (pain score 1-3) or moderate pain (pain score 4-6).   albuterol 108 (90 Base) MCG/ACT inhaler Commonly known as: VENTOLIN HFA Inhale 2 puffs into the lungs every 4 (four) hours as needed for wheezing or shortness of breath.   allopurinol 100 MG tablet Commonly known as: ZYLOPRIM Take 100 mg by mouth in the morning.   amLODipine 5 MG tablet Commonly known as: NORVASC Take 5 mg by mouth every morning.   atorvastatin 80 MG tablet Commonly known as: LIPITOR Take 1 tablet (80 mg total) by mouth at bedtime. What changed:  medication strength how much to take   B-12 PO Take 1 tablet by mouth in the morning.   bisoprolol 5 MG tablet Commonly known as: ZEBETA TAKE 1 TABLET(5 MG) BY MOUTH DAILY What changed: See the new  instructions.   Eliquis 5 MG Tabs tablet Generic drug: apixaban TAKE 1 TABLET(5 MG) BY MOUTH TWICE DAILY   escitalopram 10 MG tablet Commonly known as: LEXAPRO Take 10 mg by mouth as needed.   ezetimibe 10 MG tablet Commonly known as: ZETIA Take 1 tablet (10 mg total) by mouth daily. What changed: when to take this   ferrous sulfate 325 (65 FE) MG tablet Take 1 tablet (325 mg total) by mouth daily with breakfast.   Fish Oil 1000 MG Caps Take 1 capsule by mouth in the morning.   folic acid 1 MG tablet Commonly known as: FOLVITE Take 1 tablet (1 mg total) by mouth daily. What changed: when to take this   HYDROcodone-acetaminophen 5-325 MG tablet Commonly known as: NORCO/VICODIN Take 1 tablet by mouth every 6 (six) hours as needed for moderate pain.   insulin NPH-regular Human (70-30) 100 UNIT/ML injection Inject 6 Units into the skin 2 (two) times daily with a meal. Inject 30 units into the skin in the morning and 70 units into the skin in the evening What changed:  how much to take additional instructions   Insulin Syringe-Needle U-100 31G X 5/16" 1 ML Misc by Does not apply route.  loratadine 10 MG tablet Commonly known as: CLARITIN Take 10 mg by mouth in the morning.   meclizine 12.5 MG tablet Commonly known as: ANTIVERT Take 1-2 tablets by mouth as needed for dizziness or nausea.   metolazone 2.5 MG tablet Commonly known as: ZAROXOLYN Take 2.5 mg by mouth daily as needed.   pantoprazole 40 MG tablet Commonly known as: PROTONIX Take 40 mg by mouth in the morning.   tiZANidine 4 MG tablet Commonly known as: ZANAFLEX Take 4 mg by mouth as needed for muscle spasms.   torsemide 20 MG tablet Commonly known as: DEMADEX 60mg  (3 tablets) in the morning and 20mg  (1 tablet) in the afternoon   Trelegy Ellipta 100-62.5-25 MCG/ACT Aepb Generic drug: Fluticasone-Umeclidin-Vilant INHALE 1 PUFF INTO THE LUNGS DAILY What changed: when to take this         Follow-up Information     Daisy Floro, MD Follow up in 1 week(s).   Specialty: Family Medicine Contact information: [address] [place] [address] [phone]         [facility] Follow up.   Specialty: Home Health Services Why: [facility] will contact you within 48 hours of discharge to arrange home health Contact information: [address] [place] [address] [phone]                Allergies  Allergen Reactions   Nsaids Swelling    Hands and legs swell   Penicillins Hives    Has patient had a PCN reaction causing immediate rash, facial/tongue/throat swelling, SOB or lightheadedness with hypotension: Yes Has patient had a PCN reaction causing severe rash involving mucus membranes or skin necrosis: No Has patient had a PCN reaction that required hospitalization: No Has patient had a PCN reaction occurring within the last 10 years: No If all of the above answers are "NO", then may proceed with Cephalosporin use.    Quinapril Hcl Other (See Comments)    Other reaction(s): elevated potassium and creatinine    You were cared for by a hospitalist during your hospital stay. If you have any questions about your discharge medications or the care you received while you were in the hospital after you are discharged, you can call the unit and asked to speak with the hospitalist on call if the hospitalist that took care of you is not available. Once you are discharged, your primary care physician will handle any further medical issues. Please note that no refills for any discharge medications will be authorized once you are discharged, as it is imperative that you return to your primary care physician (or establish a relationship with a primary care physician if you do not have one) for your aftercare needs so that they can reassess your need for medications and monitor your lab values.  You were cared for by a hospitalist during your  hospital stay. If you have any questions about your discharge medications or the care you received while you were in the hospital after you are discharged, you can call the unit and asked to speak with the hospitalist on call if the hospitalist that took care of you is not available. Once you are discharged, your primary care physician will handle any further medical issues. Please note that NO REFILLS for any discharge medications will be authorized once you are discharged, as it is imperative that you return to your primary care physician (or establish a relationship with a primary care physician if you do  not have one) for your aftercare needs so that they can reassess your need for medications and monitor your lab values.  Please request your Prim.MD to go over all Hospital Tests and Procedure/Radiological results at the follow up, please get all Hospital records sent to your Prim MD by signing hospital release before you go home.  Get CBC, CMP, 2 view Chest X ray checked  by Primary MD during your next visit or SNF MD in 5-7 days ( we routinely change or add medications that can affect your baseline labs and fluid status, therefore we recommend that you get the mentioned basic workup next visit with your PCP, your PCP may decide not to get them or add new tests based on their clinical decision)  On your next visit with your primary care physician please Get Medicines reviewed and adjusted.  If you experience worsening of your admission symptoms, develop shortness of breath, life threatening emergency, suicidal or homicidal thoughts you must seek medical attention immediately by calling 911 or calling your MD immediately  if symptoms less severe.  You Must read complete instructions/literature along with all the possible adverse reactions/side effects for all the Medicines you take and that have been prescribed to you. Take any new Medicines after you have completely understood and accpet all the  possible adverse reactions/side effects.   Do not drive, operate heavy machinery, perform activities at heights, swimming or participation in water activities or provide baby sitting services if your were admitted for syncope or siezures until you have seen by Primary MD or a Neurologist and advised to do so again.  Do not drive when taking Pain medications.   Procedures/Studies: VAS US CAROTID Result Date: 07/18/2023 Carotid Arterial Duplex Study Patient Name:  Paula Wheeler  Date of Exam:   07/17/2023 Medical Rec #: [id number]          Accession #:    [id number] Date of Birth: 1951-04-14          Patient Gender: F Patient Age:   40 years Exam Location:  [facility] Procedure:      VAS US CAROTID Referring Phys: Milon Dikes --------------------------------------------------------------------------------  Indications:       CVA, Weakness and Dizziness. Risk Factors:      Hypertension, hyperlipidemia, Diabetes. Other Factors:     A-fib. Comparison Study:  Previous study demonstrated 80-99% stenosis bilaterally on                    3.18.2024. Performing Technologist: Fernande Bras  Examination Guidelines: A complete evaluation includes B-mode imaging, spectral Doppler, color Doppler, and power Doppler as needed of all accessible portions of each vessel. Bilateral testing is considered an integral part of a complete examination. Limited examinations for reoccurring indications may be performed as noted.  Right Carotid Findings: +----------+--------+--------+--------+---------------------+------------------+           PSV cm/sEDV cm/sStenosisPlaque Description   Comments           +----------+--------+--------+--------+---------------------+------------------+ CCA Prox  64      6                                                       +----------+--------+--------+--------+---------------------+------------------+ CCA Distal43      5                                                        +----------+--------+--------+--------+---------------------+------------------+  ICA Prox  49      12      Occludedheterogenous and     Partially                                            calcific             occluded.          +----------+--------+--------+--------+---------------------+------------------+ ICA Mid   209     18                                                      +----------+--------+--------+--------+---------------------+------------------+ ICA Distal102     11                                                      +----------+--------+--------+--------+---------------------+------------------+ ECA       142     14                                                      +----------+--------+--------+--------+---------------------+------------------+ +----------+--------+-------+--------+-------------------+           PSV cm/sEDV cmsDescribeArm Pressure (mmHG) +----------+--------+-------+--------+-------------------+ [id number]                                        +----------+--------+-------+--------+-------------------+ +---------+--------+--+--------+--+ VertebralPSV cm/s55EDV cm/s12 +---------+--------+--+--------+--+ Right proximal ICA partially occluded. Left Carotid Findings: +----------+--------+--------+--------+-------------------------+--------+           PSV cm/sEDV cm/sStenosisPlaque Description       Comments +----------+--------+--------+--------+-------------------------+--------+ CCA Prox  81      13                                                +----------+--------+--------+--------+-------------------------+--------+ CCA Mid   100     13                                                +----------+--------+--------+--------+-------------------------+--------+ CCA Distal106     18                                                 +----------+--------+--------+--------+-------------------------+--------+ ICA Prox  405     102     80-99%  heterogenous and calcific         +----------+--------+--------+--------+-------------------------+--------+ ICA Mid   277     46      60-79%                                    +----------+--------+--------+--------+-------------------------+--------+  ICA Distal112     18                                                +----------+--------+--------+--------+-------------------------+--------+ ECA       95      8                                                 +----------+--------+--------+--------+-------------------------+--------+ +----------+--------+--------+--------+-------------------+           PSV cm/sEDV cm/sDescribeArm Pressure (mmHG) +----------+--------+--------+--------+-------------------+ Subclavian304     10                                  +----------+--------+--------+--------+-------------------+ +---------+--------+--+--------+--+ VertebralPSV cm/s83EDV cm/s19 +---------+--------+--+--------+--+ Elevated velocities in the subclavian artery without obvious signs of stenosis.  Summary: Right Carotid: Evidence consistent with a total occlusion of the right ICA. Left Carotid: Velocities in the left ICA are consistent with a 80-99% stenosis. Vertebrals:  Bilateral vertebral arteries demonstrate antegrade flow. Subclavians: Normal flow hemodynamics were seen in the right subclavian artery. *See table(s) above for measurements and observations.  Electronically signed by Lemar Livings MD on 07/18/2023 at 2:21:51 PM.    Final    ECHOCARDIOGRAM COMPLETE Result Date: 07/18/2023    ECHOCARDIOGRAM REPORT   Patient Name:   Paula Wheeler Date of Exam: 07/18/2023 Medical Rec #:  [id number]         Height:       63.0 in Accession #:    [id number]        Weight:       177.0 lb Date of Birth:  [birth year]         BSA:          1.836 m Patient Age:    72 years           BP:           153/63 mmHg Patient Gender: F                 HR:           65 bpm. Exam Location:  Inpatient Procedure: 2D Echo, Cardiac Doppler and Color Doppler (Both Spectral and Color            Flow Doppler were utilized during procedure). Indications:    Stroke I63.9  History:        Patient has prior history of Echocardiogram examinations, most                 recent 08/10/2021. CHF, CAD, PAD and CKD, stage 4,                 Arrythmias:Atrial Fibrillation, Signs/Symptoms:Shortness of                 Breath; Risk Factors:Hypertension, Diabetes, Dyslipidemia and                 Current Smoker.  Sonographer:    Lucendia Herrlich RCS Referring Phys: [phone] CLAUDIA CLAIBORNE IMPRESSIONS  1. Left ventricular ejection fraction, by estimation, is 60 to 65%. Left ventricular ejection fraction by PLAX is 61 %. The left ventricle has normal function. The  left ventricle has no regional wall motion abnormalities. Left ventricular diastolic parameters are indeterminate. Elevated left ventricular end-diastolic pressure.  2. Right ventricular systolic function is low normal. The right ventricular size is normal. There is normal pulmonary artery systolic pressure. The estimated right ventricular systolic pressure is 28.5 mmHg.  3. Left atrial size was moderately dilated.  4. The mitral valve is grossly normal. Mild mitral valve regurgitation.  5. The aortic valve is tricuspid. Aortic valve regurgitation is not visualized.  6. Aortic dilatation noted. There is mild dilatation of the ascending aorta, measuring 41 mm.  7. The inferior vena cava is dilated in size with <50% respiratory variability, suggesting right atrial pressure of 15 mmHg. Comparison(s): No significant change from prior study. 08/10/2021: LVEF 60-65%. FINDINGS  Left Ventricle: Left ventricular ejection fraction, by estimation, is 60 to 65%. Left ventricular ejection fraction by PLAX is 61 %. The left ventricle has normal function. The left ventricle has no  regional wall motion abnormalities. The left ventricular internal cavity size was normal in size. There is no left ventricular hypertrophy. Left ventricular diastolic function could not be evaluated due to atrial fibrillation. Left ventricular diastolic parameters are indeterminate. Elevated left ventricular end-diastolic pressure. Right Ventricle: The right ventricular size is normal. No increase in right ventricular wall thickness. Right ventricular systolic function is low normal. There is normal pulmonary artery systolic pressure. The tricuspid regurgitant velocity is 1.84 m/s,  and with an assumed right atrial pressure of 15 mmHg, the estimated right ventricular systolic pressure is 28.5 mmHg. Left Atrium: Left atrial size was moderately dilated. Right Atrium: Right atrial size was normal in size. Pericardium: There is no evidence of pericardial effusion. Mitral Valve: The mitral valve is grossly normal. Mild mitral valve regurgitation. Tricuspid Valve: The tricuspid valve is grossly normal. Tricuspid valve regurgitation is trivial. Aortic Valve: The aortic valve is tricuspid. Aortic valve regurgitation is not visualized. Aortic valve peak gradient measures 12.1 mmHg. Pulmonic Valve: The pulmonic valve was normal in structure. Pulmonic valve regurgitation is not visualized. Aorta: Aortic dilatation noted. There is mild dilatation of the ascending aorta, measuring 41 mm. Venous: The inferior vena cava is dilated in size with less than 50% respiratory variability, suggesting right atrial pressure of 15 mmHg. IAS/Shunts: No atrial level shunt detected by color flow Doppler.  LEFT VENTRICLE PLAX 2D LV EF:         Left            Diastology                ventricular     LV e' medial:    5.92 cm/s                ejection        LV E/e' medial:  19.8                fraction by     LV e' lateral:   8.60 cm/s                PLAX is 61      LV E/e' lateral: 13.6                %. LVIDd:         4.90 cm LVIDs:          3.30 cm LV PW:         1.10 cm LV IVS:        1.00 cm LVOT diam:  2.10 cm LV SV:         74 LV SV Index:   40 LVOT Area:     3.46 cm  RIGHT VENTRICLE             IVC RV S prime:     10.70 cm/s  IVC diam: 2.50 cm TAPSE (M-mode): 1.7 cm LEFT ATRIUM             Index        RIGHT ATRIUM           Index LA diam:        4.10 cm 2.23 cm/m   RA Area:     18.80 cm LA Vol (A2C):   83.4 ml 45.45 ml/m  RA Volume:   50.70 ml  27.62 ml/m LA Vol (A4C):   88.2 ml 48.04 ml/m LA Biplane Vol: 88.7 ml 48.32 ml/m  AORTIC VALVE AV Area (Vmax): 1.85 cm AV Vmax:        174.00 cm/s AV Peak Grad:   12.1 mmHg LVOT Vmax:      92.90 cm/s LVOT Vmean:     60.967 cm/s LVOT VTI:       0.213 m  AORTA Ao Root diam: 3.40 cm Ao Asc diam:  4.10 cm MITRAL VALVE                TRICUSPID VALVE MV Area (PHT): 5.54 cm     TR Peak grad:   13.5 mmHg MV Decel Time: 137 msec     TR Vmax:        184.00 cm/s MR Peak grad: 48.7 mmHg MR Vmax:      349.00 cm/s   SHUNTS MV E velocity: 117.00 cm/s  Systemic VTI:  0.21 m MV A velocity: 48.50 cm/s   Systemic Diam: 2.10 cm MV E/A ratio:  2.41 Zoila Shutter MD Electronically signed by Zoila Shutter MD Signature Date/Time: 07/18/2023/1:54:38 PM    Final    MR ANGIO HEAD WO CONTRAST Result Date: 07/16/2023 CLINICAL DATA:  Initial evaluation for acute TIA. EXAM: MRA HEAD WITHOUT CONTRAST TECHNIQUE: Angiographic images of the Circle of Willis were acquired using MRA technique without intravenous contrast. COMPARISON:  Comparison made with MRIs from earlier the same day. FINDINGS: Anterior circulation: Right ICA is occluded within the upper neck, and remains occluded to the terminus. Mild atheromatous irregularity about the cavernous left ICA without hemodynamically significant stenosis. A1 segments patent bilaterally. Right A1 segment diminutive. Normal anterior communicating artery complex. Anterior cerebral arteries patent without stenosis. Distal reconstitution at the right ICA terminus related contralateral  flow across the circle-of-Willis. Both M1 segments are patent. No proximal MCA branch occlusion or high-grade stenosis. Distal MCA branches perfused and symmetric. Posterior circulation: Both V4 segments patent without significant stenosis. Right vertebral artery slightly dominant. Left PICA patent. Right PICA not well seen. Basilar patent without stenosis. Superior cerebellar arteries patent bilaterally. Both PCAs primarily supplied via the basilar. Small right posterior communicating artery noted. Both PCAs patent to their distal aspects without significant stenosis. Anatomic variants: As above. Other: No intracranial aneurysm. IMPRESSION: 1. 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 Distal reconstitution via collateral flow across the circle-of-Willis with perfusion of the right MCA. 2. Otherwise negative intracranial MRA. No other large vessel occlusion or hemodynamically significant stenosis. Electronically Signed   By: Rise Mu M.D.   On: 07/16/2023 23:28   MR BRAIN W CONTRAST Result Date: 07/16/2023 CLINICAL DATA:  Initial evaluation for  demyelinating disease. EXAM: MRI HEAD WITH CONTRAST TECHNIQUE: Multiplanar, multiecho pulse sequences of the brain and surrounding structures were obtained with intravenous contrast. CONTRAST:  8mL GADAVIST GADOBUTROL 1 MMOL/ML IV SOLN COMPARISON:  Noncontrast brain MRI from earlier the same day. FINDINGS: Brain: Previously identified signal abnormality involving the central pons again noted. Following contrast administration, no abnormal or pathologic enhancement seen within the brain. No other new abnormality. Vascular: Normal intravascular enhancement seen within the major intracranial vascular structures. Skull and upper cervical spine: No new finding. Sinuses/Orbits: No new finding. Other: None. IMPRESSION: No abnormal enhancement within the brain. Electronically Signed   By: Rise Mu M.D.   On: 07/16/2023 20:43   DG Toe Great  Right Result Date: 07/16/2023 CLINICAL DATA:  Osteomyelitis EXAM: RIGHT GREAT TOE COMPARISON:  None Available. FINDINGS: Frontal, oblique, and lateral views of the right first digit are obtained on 5 total images. There are no acute or destructive bony abnormalities. No periosteal reaction. 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 There is diffuse soft tissue swelling of the first digit and dorsal margin of the forefoot. No evidence of subcutaneous gas or radiopaque foreign body. IMPRESSION: 1. Soft tissue swelling of the forefoot and first digit. No radiographic evidence of osteomyelitis. 2. 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 Electronically Signed   By: Sharlet Salina M.D.   On: 07/16/2023 17:52   MR BRAIN WO CONTRAST Result Date: 07/16/2023 CLINICAL DATA:  Abnormal CT scan. Headache. Neuro deficit. Generalized weakness. Dizziness. Recent falls. EXAM: MRI HEAD WITHOUT CONTRAST TECHNIQUE: Multiplanar, multiecho pulse sequences of the brain and surrounding structures were obtained without intravenous contrast. COMPARISON:  CT head without contrast 07/16/2023. FINDINGS: Brain: Somewhat ill-defined restricted diffusion is present within the central pons. A striated T2 and FLAIR appearance is present. No expansion of the brainstem or mass effect is evident. No focal susceptibility is present to suggest hemorrhage or calcification. The supratentorial brain is otherwise within normal limits. No acute infarct, hemorrhage or mass lesion is present in the supratentorial brain. No significant white matter lesions are present. Deep brain nuclei are within normal limits. Insert normal ventricles No significant extraaxial fluid collection is present. Cerebellum is unremarkable. The internal auditory canals are within normal limits. 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 Flow is present in the supraclinoid ICA, A1 and M1 segments. Flow is  present in the left internal carotid artery in the posterior circulation. Skull and upper cervical spine: The brainstem and cerebellum are within normal limits. Sinuses/Orbits: The paranasal sinuses and mastoid air cells are clear. The globes and orbits are within normal limits. IMPRESSION: 1. Ill-defined restricted diffusion within the central pons with a striated T2 and FLAIR appearance. 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 A demyelinating process could have a similar appearance. Neoplasm is considered less likely. Postcontrast imaging would be helpful to further delineate the diagnosis. 2. 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 Flow is present in the supraclinoid ICA, A1 and M1 segments. Flow is present in the left internal carotid artery in the posterior circulation. 3. The supratentorial brain is within normal limits. These results were called by telephone at the time of interpretation on 07/16/2023 at 5:36 pm to provider Dr. Renaye Rakers, who verbally acknowledged these results. Electronically Signed   By: Marin Roberts M.D.   On: 07/16/2023 17:37   CT HEAD WO CONTRAST Result Date: 07/16/2023 CLINICAL DATA:  Headache,  neuro deficit. Generalized weakness, head fullness, and dizziness with multiple recent falls. EXAM: CT HEAD WITHOUT CONTRAST TECHNIQUE: Contiguous axial images were obtained from the base of the skull through the vertex without intravenous contrast. RADIATION DOSE REDUCTION: This exam was performed according to the departmental dose-optimization program which includes automated exposure control, adjustment of the mA and/or kV according to patient size and/or use of iterative reconstruction technique. COMPARISON:  Neck CT 10/03/2022 FINDINGS: Brain: There is no evidence of an acute infarct, mass, midline shift, or extra-axial fluid collection. Cerebral volume is within normal limits for age. The  ventricles are normal in size. There is a 3 mm hyperdense focus centrally in the pons without associated edema which appears new from the prior neck CT and could reflect recent hemorrhage or mineralization. There is no evidence of acute intracranial hemorrhage elsewhere. Vascular: Calcified atherosclerosis at the skull base. No hyperdense vessel. Skull: No acute fracture or suspicious lesion. Sinuses/Orbits: Partial opacification of a posterior right ethmoid air cell. Minimal bilateral mastoid air cell opacification. Unremarkable orbits. Other: None. IMPRESSION: 1. 3 mm hyperdensity in the pons, indeterminate for a small recent hemorrhage or mineralization. Recommend brain MRI for further evaluation. 2. No other evidence of acute intracranial abnormality. Electronically Signed   By: Sebastian Ache M.D.   On: 07/16/2023 11:43     The results of significant diagnostics from this hospitalization (including imaging, microbiology, ancillary and laboratory) are listed below for reference.     Microbiology: Recent Results (from the past 240 hours)  MRSA Next Gen by PCR, Nasal     Status: None   Collection Time: 07/17/23  3:01 PM   Specimen: Nasal Mucosa; Nasal Swab  Result Value Ref Range Status   MRSA by PCR Next Gen NOT DETECTED NOT DETECTED Final    Comment: (NOTE) The GeneXpert MRSA Assay (FDA approved for NASAL specimens only), is one component of a comprehensive MRSA colonization surveillance program. It is not intended to diagnose MRSA infection nor to guide or monitor treatment for MRSA infections. Test performance is not FDA approved in patients less than 97 years old. Performed at [facility], [address], [place], [place] [address]      Labs: BNP (last 3 results) No results for input(s): "BNP" in the last 8760 hours. Basic Metabolic Panel: Recent Labs  Lab 07/16/23 0934 07/17/23 0504  NA 131* 136  K 4.4 3.1*  CL 94* 102  CO2 28 25  GLUCOSE 676* 308*  BUN 21 16   CREATININE 1.91* 1.57*  CALCIUM 9.0 8.1*   Liver Function Tests: No results for input(s): "AST", "ALT", "ALKPHOS", "BILITOT", "PROT", "ALBUMIN" in the last 168 hours. No results for input(s): "LIPASE", "AMYLASE" in the last 168 hours. No results for input(s): "AMMONIA" in the last 168 hours. CBC: Recent Labs  Lab 07/16/23 0934 07/17/23 0504  WBC 10.7* 8.1  HGB 12.6 10.3*  HCT 39.4 31.5*  MCV 99.2 94.9  PLT 226 217   Cardiac Enzymes: No results for input(s): "CKTOTAL", "CKMB", "CKMBINDEX", "TROPONINI" in the last 168 hours. BNP: Invalid input(s): "POCBNP" CBG: Recent Labs  Lab 07/17/23 1204 07/17/23 1632 07/17/23 2043 07/18/23 0843 07/18/23 1400  GLUCAP 323* 367* 284* 242* 231*   D-Dimer No results for input(s): "DDIMER" in the last 72 hours. Hgb A1c Recent Labs    07/17/23 0506  HGBA1C >15.5*   Lipid Profile Recent Labs    07/17/23 0505  CHOL 149  HDL 28*  LDLCALC 95  TRIG 604  CHOLHDL 5.3   Thyroid function studies Recent Labs    07/17/23 0505  TSH 0.896   Anemia work up No results for input(s): "VITAMINB12", "FOLATE", "FERRITIN", "TIBC", "IRON", "RETICCTPCT" in the last 72 hours. Urinalysis    Component Value Date/Time   COLORURINE YELLOW 07/16/2023 1500   APPEARANCEUR HAZY (A) 07/16/2023 1500   LABSPEC 1.021 07/16/2023 1500   PHURINE 6.0 07/16/2023 1500   GLUCOSEU >=500 (A) 07/16/2023 1500   HGBUR SMALL (A) 07/16/2023 1500   BILIRUBINUR NEGATIVE 07/16/2023 1500   KETONESUR NEGATIVE 07/16/2023 1500   PROTEINUR NEGATIVE 07/16/2023 1500   NITRITE NEGATIVE 07/16/2023 1500   LEUKOCYTESUR LARGE (A) 07/16/2023 1500   Sepsis Labs Recent Labs  Lab 07/16/23 0934 07/17/23 0504  WBC 10.7* 8.1   Microbiology Recent Results (from the past 240 hours)  MRSA Next Gen by PCR, Nasal     Status: None   Collection Time: 07/17/23  3:01 PM   Specimen: Nasal Mucosa; Nasal Swab  Result Value Ref Range Status   MRSA by PCR Next Gen NOT DETECTED NOT  DETECTED Final    Comment: (NOTE) The GeneXpert MRSA Assay (FDA approved for NASAL specimens only), is one component of a comprehensive MRSA colonization surveillance program. It is not intended to diagnose MRSA infection nor to guide or monitor treatment for MRSA infections. Test performance is not FDA approved in patients less than 58 years old. Performed at [facility], [address] [place], [place] [address]      Time coordinating discharge:  I have spent 35 minutes face to face with the patient and on the ward discussing the patients care, assessment, plan and disposition with other care givers. >50% of the time was devoted counseling the patient about the risks and benefits of treatment/Discharge disposition and coordinating care.   SIGNED:   Miguel Rota, MD  [facility] 07/18/2023, 2:47 PM   If 7PM-7AM, please contact night-coverage

## 2023-07-18 NOTE — Plan of Care (Signed)
 Progressing. Patient needed CPAP at night due to prolonged apnea.

## 2023-07-18 NOTE — Progress Notes (Signed)
 PROGRESS NOTE    Paula Wheeler  GEX:[id number] DOB: [birth year] DOA: 07/16/2023 PCP: Daisy Floro, MD    Brief Narrative:  73 year old with history of DM2, HTN, HLD, persistent A-fib on Eliquis, COPD presented to the hospital with frequent falls, dizziness for about a month.  Workup showed concerns of pontine demyelinating lesion/central pontine melanosis which neurology suspect is secondary to metabolic derangements given hypoglycemia.  Other differentials could be CVA, neurology recommending continuing Eliquis but no antiplatelets at this time.   Assessment & Plan:  Principal Problem:   Brain lesion   Pontine osmotic demyelination Dizziness and headache/ambulatory dysfunction - There is no clear evidence of CVA at this time therefore we will continue Eliquis without antiplatelet treatment.  MRA showed occlusion of right ICA.  MRI brain is negative for any acute CVA.  LDL 95, A1c pending - Echocardiogram-pending - Vascular consult-patient declined.  She wants to follow-up outpatient with Dr. Chestine Spore - LDL-95  Hypokalemia -As needed repletion  Bacteriuria - Denying any symptoms.  Can follow-up outpatient  Insulin-dependent diabetes mellitus type 2, brittle -At home 70/30 insulin.  Currently on Lantus and sliding scale.  Will further adjust as necessary.  A1c is currently pending  Essential hypertension - On Norvasc.  IV as needed  Hyperlipidemia - On Zetia.  LDL pending  COPD - Bronchodilators.  Persistent atrial fibrillation - On Eliquis.  Continue bisoprolol.  IV as needed   DVT prophylaxis: apixaban (ELIQUIS) tablet 5 mg     Code Status: Full Code Family Communication:   Status is: Inpatient Remains inpatient appropriate because: Ongoing evaluation for central pontine myelinolysis.  If cleared by neuro will discharge her    Subjective:  Patient is adamant she wants to go home today.  Does not want any further inpatient treatment including her  intracranial stenosis and vascular evaluation. She tells me she feels back to her baseline.  Examination:  General exam: Appears calm and comfortable  Respiratory system: Clear to auscultation. Respiratory effort normal. Cardiovascular system: S1 & S2 heard, RRR. No JVD, murmurs, rubs, gallops or clicks. No pedal edema. Gastrointestinal system: Abdomen is nondistended, soft and nontender. No organomegaly or masses felt. Normal bowel sounds heard. Central nervous system: Alert and oriented. No focal neurological deficits. Extremities: Symmetric 5 x 5 power. Skin: No rashes, lesions or ulcers Psychiatry: Judgement and insight appear normal. Mood & affect appropriate.            Pressure Injury 07/17/23 Sacrum Mid;Right;Left Stage 2 -  Partial thickness loss of dermis presenting as a shallow open injury with a red, pink wound bed without slough. open pink skin (Active)  07/17/23 1308  Location: Sacrum  Location Orientation: Mid;Right;Left  Staging: Stage 2 -  Partial thickness loss of dermis presenting as a shallow open injury with a red, pink wound bed without slough.  Wound Description (Comments): open pink skin  Present on Admission: Yes     Diet Orders (From admission, onward)     Start     Ordered   07/16/23 2106  Diet Carb Modified Fluid consistency: Thin; Room service appropriate? Yes  Diet effective now       Question Answer Comment  Diet-HS Snack? Snack-yes   Calorie Level Medium 1600-2000   Fluid consistency: Thin   Room service appropriate? Yes      07/16/23 2106            Objective: Vitals:   07/17/23 2000 07/18/23 0000 07/18/23 0400 07/18/23 0838  BP: 138/78 Marland Kitchen)  145/62 (!) 153/63 (!) 154/70  Pulse: 68 79 (!) 56 74  Resp: 20 (!) 23 19 20   Temp: 97.6 F (36.4 C) 97.9 F (36.6 C) 97.7 F (36.5 C) 97.7 F (36.5 C)  TempSrc: Oral Oral Oral Oral  SpO2: 96% 96% 96% 95%  Weight:      Height:        Intake/Output Summary (Last 24 hours) at  07/18/2023 1146 Last data filed at 07/18/2023 0843 Gross per 24 hour  Intake 1779.01 ml  Output 50 ml  Net 1729.01 ml   Filed Weights   07/16/23 0921  Weight: 80.3 kg    Scheduled Meds:  allopurinol  100 mg Oral Daily   amLODipine  5 mg Oral q morning   apixaban  5 mg Oral BID   bisoprolol  5 mg Oral q AM   ezetimibe  10 mg Oral Daily   fluticasone furoate-vilanterol  1 puff Inhalation Daily   And   umeclidinium bromide  1 puff Inhalation Daily   folic acid  1 mg Oral Daily   insulin aspart  0-6 Units Subcutaneous TID WC   insulin glargine  30 Units Subcutaneous QHS   pantoprazole  40 mg Oral Daily   Continuous Infusions:  Nutritional status     Body mass index is 31.35 kg/m.  Data Reviewed:   CBC: Recent Labs  Lab 07/16/23 0934 07/17/23 0504  WBC 10.7* 8.1  HGB 12.6 10.3*  HCT 39.4 31.5*  MCV 99.2 94.9  PLT 226 217   Basic Metabolic Panel: Recent Labs  Lab 07/16/23 0934 07/17/23 0504  NA 131* 136  K 4.4 3.1*  CL 94* 102  CO2 28 25  GLUCOSE 676* 308*  BUN 21 16  CREATININE 1.91* 1.57*  CALCIUM 9.0 8.1*   GFR: Estimated Creatinine Clearance: 32.5 mL/min (A) (by C-G formula based on SCr of 1.57 mg/dL (H)). Liver Function Tests: No results for input(s): "AST", "ALT", "ALKPHOS", "BILITOT", "PROT", "ALBUMIN" in the last 168 hours. No results for input(s): "LIPASE", "AMYLASE" in the last 168 hours. No results for input(s): "AMMONIA" in the last 168 hours. Coagulation Profile: No results for input(s): "INR", "PROTIME" in the last 168 hours. Cardiac Enzymes: No results for input(s): "CKTOTAL", "CKMB", "CKMBINDEX", "TROPONINI" in the last 168 hours. BNP (last 3 results) No results for input(s): "PROBNP" in the last 8760 hours. HbA1C: No results for input(s): "HGBA1C" in the last 72 hours. CBG: Recent Labs  Lab 07/17/23 0745 07/17/23 1204 07/17/23 1632 07/17/23 2043 07/18/23 0843  GLUCAP 238* 323* 367* 284* 242*   Lipid Profile: Recent Labs     07/17/23 0505  CHOL 149  HDL 28*  LDLCALC 95  TRIG 161  CHOLHDL 5.3   Thyroid Function Tests: Recent Labs    07/17/23 0505  TSH 0.896   Anemia Panel: No results for input(s): "VITAMINB12", "FOLATE", "FERRITIN", "TIBC", "IRON", "RETICCTPCT" in the last 72 hours. Sepsis Labs: No results for input(s): "PROCALCITON", "LATICACIDVEN" in the last 168 hours.  Recent Results (from the past 240 hours)  MRSA Next Gen by PCR, Nasal     Status: None   Collection Time: 07/17/23  3:01 PM   Specimen: Nasal Mucosa; Nasal Swab  Result Value Ref Range Status   MRSA by PCR Next Gen NOT DETECTED NOT DETECTED Final    Comment: (NOTE) The GeneXpert MRSA Assay (FDA approved for NASAL specimens only), is one component of a comprehensive MRSA colonization surveillance program. It is not intended to diagnose MRSA infection  nor to guide or monitor treatment for MRSA infections. Test performance is not FDA approved in patients less than 44 years old. Performed at [facility], [address], [place], [place] [address]          Radiology Studies: VAS US CAROTID Result Date: 07/17/2023 Carotid Arterial Duplex Study Patient Name:  FABIENNE NOLASCO  Date of Exam:   07/17/2023 Medical Rec #: [id number]          Accession #:    [id number] Date of Birth: 1951/01/29          Patient Gender: F Patient Age:   61 years Exam Location:  [facility] Procedure:      VAS US CAROTID Referring Phys: Milon Dikes --------------------------------------------------------------------------------  Indications:       CVA, Weakness and Dizziness. Risk Factors:      Hypertension, hyperlipidemia, Diabetes. Other Factors:     A-fib. Comparison Study:  Previous study demonstrated 80-99% stenosis bilaterally on                    3.18.2024. Performing Technologist: Fernande Bras  Examination Guidelines: A complete evaluation includes B-mode imaging, spectral Doppler, color Doppler, and power Doppler as needed of  all accessible portions of each vessel. Bilateral testing is considered an integral part of a complete examination. Limited examinations for reoccurring indications may be performed as noted.  Right Carotid Findings: +----------+--------+--------+--------+---------------------+------------------+           PSV cm/sEDV cm/sStenosisPlaque Description   Comments           +----------+--------+--------+--------+---------------------+------------------+ CCA Prox  64      6                                                       +----------+--------+--------+--------+---------------------+------------------+ CCA Distal43      5                                                       +----------+--------+--------+--------+---------------------+------------------+ ICA Prox  49      12      Occludedheterogenous and     Partially                                            calcific             occluded.          +----------+--------+--------+--------+---------------------+------------------+ ICA Mid   209     18                                                      +----------+--------+--------+--------+---------------------+------------------+ ICA Distal102     11                                                      +----------+--------+--------+--------+---------------------+------------------+  ECA       142     14                                                      +----------+--------+--------+--------+---------------------+------------------+ +----------+--------+-------+--------+-------------------+           PSV cm/sEDV cmsDescribeArm Pressure (mmHG) +----------+--------+-------+--------+-------------------+ [id number]                                        +----------+--------+-------+--------+-------------------+ +---------+--------+--+--------+--+ VertebralPSV cm/s55EDV cm/s12 +---------+--------+--+--------+--+ Right proximal ICA partially  occluded. Left Carotid Findings: +----------+--------+--------+--------+-------------------------+--------+           PSV cm/sEDV cm/sStenosisPlaque Description       Comments +----------+--------+--------+--------+-------------------------+--------+ CCA Prox  81      13                                                +----------+--------+--------+--------+-------------------------+--------+ CCA Mid   100     13                                                +----------+--------+--------+--------+-------------------------+--------+ CCA Distal106     18                                                +----------+--------+--------+--------+-------------------------+--------+ ICA Prox  405     102     80-99%  heterogenous and calcific         +----------+--------+--------+--------+-------------------------+--------+ ICA Mid   277     46      60-79%                                    +----------+--------+--------+--------+-------------------------+--------+ ICA Distal112     18                                                +----------+--------+--------+--------+-------------------------+--------+ ECA       95      8                                                 +----------+--------+--------+--------+-------------------------+--------+ +----------+--------+--------+--------+-------------------+           PSV cm/sEDV cm/sDescribeArm Pressure (mmHG) +----------+--------+--------+--------+-------------------+ Subclavian304     10                                  +----------+--------+--------+--------+-------------------+ +---------+--------+--+--------+--+ VertebralPSV cm/s83EDV cm/s19 +---------+--------+--+--------+--+ Elevated velocities in the subclavian artery without obvious  signs of stenosis.  Summary: Right Carotid: Evidence consistent with a total occlusion of the right ICA. Left Carotid: Velocities in the left ICA are consistent with a  80-99% stenosis. Vertebrals:  Bilateral vertebral arteries demonstrate antegrade flow. Subclavians: Normal flow hemodynamics were seen in the right subclavian artery. *See table(s) above for measurements and observations.     Preliminary    MR ANGIO HEAD WO CONTRAST Result Date: 07/16/2023 CLINICAL DATA:  Initial evaluation for acute TIA. EXAM: MRA HEAD WITHOUT CONTRAST TECHNIQUE: Angiographic images of the Circle of Willis were acquired using MRA technique without intravenous contrast. COMPARISON:  Comparison made with MRIs from earlier the same day. FINDINGS: Anterior circulation: Right ICA is occluded within the upper neck, and remains occluded to the terminus. Mild atheromatous irregularity about the cavernous left ICA without hemodynamically significant stenosis. A1 segments patent bilaterally. Right A1 segment diminutive. Normal anterior communicating artery complex. Anterior cerebral arteries patent without stenosis. Distal reconstitution at the right ICA terminus related contralateral flow across the circle-of-Willis. Both M1 segments are patent. No proximal MCA branch occlusion or high-grade stenosis. Distal MCA branches perfused and symmetric. Posterior circulation: Both V4 segments patent without significant stenosis. Right vertebral artery slightly dominant. Left PICA patent. Right PICA not well seen. Basilar patent without stenosis. Superior cerebellar arteries patent bilaterally. Both PCAs primarily supplied via the basilar. Small right posterior communicating artery noted. Both PCAs patent to their distal aspects without significant stenosis. Anatomic variants: As above. Other: No intracranial aneurysm. IMPRESSION: 1. 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 Distal reconstitution via collateral flow across the circle-of-Willis with perfusion of the right MCA. 2. Otherwise negative intracranial MRA. No other large vessel occlusion or hemodynamically significant stenosis.  Electronically Signed   By: Rise Mu M.D.   On: 07/16/2023 23:28   MR BRAIN W CONTRAST Result Date: 07/16/2023 CLINICAL DATA:  Initial evaluation for demyelinating disease. EXAM: MRI HEAD WITH CONTRAST TECHNIQUE: Multiplanar, multiecho pulse sequences of the brain and surrounding structures were obtained with intravenous contrast. CONTRAST:  8mL GADAVIST GADOBUTROL 1 MMOL/ML IV SOLN COMPARISON:  Noncontrast brain MRI from earlier the same day. FINDINGS: Brain: Previously identified signal abnormality involving the central pons again noted. Following contrast administration, no abnormal or pathologic enhancement seen within the brain. No other new abnormality. Vascular: Normal intravascular enhancement seen within the major intracranial vascular structures. Skull and upper cervical spine: No new finding. Sinuses/Orbits: No new finding. Other: None. IMPRESSION: No abnormal enhancement within the brain. Electronically Signed   By: Rise Mu M.D.   On: 07/16/2023 20:43   DG Toe Great Right Result Date: 07/16/2023 CLINICAL DATA:  Osteomyelitis EXAM: RIGHT GREAT TOE COMPARISON:  None Available. FINDINGS: Frontal, oblique, and lateral views of the right first digit are obtained on 5 total images. There are no acute or destructive bony abnormalities. No periosteal reaction. 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 There is diffuse soft tissue swelling of the first digit and dorsal margin of the forefoot. No evidence of subcutaneous gas or radiopaque foreign body. IMPRESSION: 1. Soft tissue swelling of the forefoot and first digit. No radiographic evidence of osteomyelitis. 2. 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 Electronically Signed   By: Sharlet Salina M.D.   On: 07/16/2023 17:52   MR BRAIN WO CONTRAST Result Date: 07/16/2023 CLINICAL DATA:  Abnormal CT scan. Headache. Neuro deficit. Generalized weakness. Dizziness. Recent falls. EXAM: MRI  HEAD WITHOUT CONTRAST TECHNIQUE: Multiplanar, multiecho pulse sequences of the brain and surrounding  structures were obtained without intravenous contrast. COMPARISON:  CT head without contrast 07/16/2023. FINDINGS: Brain: Somewhat ill-defined restricted diffusion is present within the central pons. A striated T2 and FLAIR appearance is present. No expansion of the brainstem or mass effect is evident. No focal susceptibility is present to suggest hemorrhage or calcification. The supratentorial brain is otherwise within normal limits. No acute infarct, hemorrhage or mass lesion is present in the supratentorial brain. No significant white matter lesions are present. Deep brain nuclei are within normal limits. Insert normal ventricles No significant extraaxial fluid collection is present. Cerebellum is unremarkable. The internal auditory canals are within normal limits. 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 Flow is present in the supraclinoid ICA, A1 and M1 segments. Flow is present in the left internal carotid artery in the posterior circulation. Skull and upper cervical spine: The brainstem and cerebellum are within normal limits. Sinuses/Orbits: The paranasal sinuses and mastoid air cells are clear. The globes and orbits are within normal limits. IMPRESSION: 1. Ill-defined restricted diffusion within the central pons with a striated T2 and FLAIR appearance. 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 A demyelinating process could have a similar appearance. Neoplasm is considered less likely. Postcontrast imaging would be helpful to further delineate the diagnosis. 2. 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 Flow is present in the supraclinoid ICA, A1 and M1 segments. Flow is present in the left internal carotid artery in the posterior circulation. 3. The supratentorial brain is within normal  limits. These results were called by telephone at the time of interpretation on 07/16/2023 at 5:36 pm to provider Dr. Renaye Rakers, who verbally acknowledged these results. Electronically Signed   By: Marin Roberts M.D.   On: 07/16/2023 17:37           LOS: 2 days   Time spent= 35 mins    Miguel Rota, MD [facility]  If 7PM-7AM, please contact night-coverage  07/18/2023, 11:46 AM

## 2023-07-18 NOTE — Telephone Encounter (Signed)
 Prescription refill request for Eliquis received. Indication: Afib  Last office visit: 07/29/22 (Nahser)  Scr: 1.57 (07/17/23)  Age: 73 Weight: 80.3kg  Appropriate dose. Refill sent.

## 2023-07-18 NOTE — Inpatient Diabetes Management (Signed)
 Inpatient Diabetes Program Recommendations  AACE/ADA: New Consensus Statement on Inpatient Glycemic Control (2015)  Target Ranges:  Prepandial:   less than 140 mg/dL      Peak postprandial:   less than 180 mg/dL (1-2 hours)      Critically ill patients:  140 - 180 mg/dL   Lab Results  Component Value Date   GLUCAP 242 (H) 07/18/2023   HGBA1C 7.1 (H) 11/23/2019    Review of Glycemic Control  Latest Reference Range & Units 07/17/23 07:45 07/17/23 12:04 07/17/23 16:32 07/17/23 20:43 07/18/23 08:43  Glucose-Capillary 70 - 99 mg/dL 540 (H) 981 (H) 191 (H) 284 (H) 242 (H)  (H): Data is abnormally high  Diabetes history: DM2 Outpatient Diabetes medications: Novolin 70/30 30 units QAM, 60 units QHS Current orders for Inpatient glycemic control: Novolog 0-6 units TID, Lantus 30 units QHS  Inpatient Diabetes Program Recommendations:    Lantus 40 units at bedtime Novolog 3 units TID with meals if she consumes at least 50%  Will continue to follow while inpatient.  Thank you, Dulce Sellar, MSN, CDCES Diabetes Coordinator Inpatient Diabetes Program [phone] (team pager from 8a-5p)

## 2023-07-18 NOTE — Plan of Care (Signed)
 MRA head:  1. 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 Distal reconstitution via collateral flow across the circle-of-Willis with perfusion of the right MCA. 2. Otherwise negative intracranial MRA. No other large vessel occlusion or hemodynamically significant stenosis.  Carotid ultrasound: - Right Carotid: Evidence consistent with a total occlusion of the right  ICA.  - Left Carotid: Velocities in the left ICA are consistent with a 80-99%  stenosis. - Vertebrals: Bilateral vertebral arteries demonstrate antegrade flow.  Subclavians: Normal flow hemodynamics were seen in the right subclavian  artery.   TTE is pending   Assessment:  73 y.o. female past history of diabetes hypertension hyperlipidemia persistent A-fib on Eliquis COPD presenting for evaluation of frequent falls in the "last few days" and a headache and head fullness along with dizziness ongoing for a few weeks to a month. Stated on further interview that her symptoms first began about 4 weeks ago. Poorly controlled blood glucose levels. MRI brain with an ill-defined area of restricted diffusion within the central pons with T2/FLAIR striated appearance most consistent with osmotic demyelination. Sugars extremely high on admission, futher increasing the likelihood of an osmotic demyelination event as rapid and profound changes in blood glucose are a known potential cause of osmotic demyelination (aka central pontine myelinolysis).  - Although metabolic reasons for osmotic demyelination is the most likely possibility for her pontine lesion, which does correlate with her presenting symptoms of gait instability, she also has a chronic right ICA stenosis and on the MRI looked like there might be a chronic occlusion of the ICA at the skull base.  No strokes-occlusion also likely chronic. Because of the differentials of imaging mentioning stroke, we have ordered a stroke work up with results above so far not supporting  infarction as the etiology for the pontine lesion, although she does have significant vascular disease involving her anterior circulation.  Impression: Weakness likely secondary to pontine osmotic demyelination as a sequela of metabolic derangements such as hyperglycemia  Recommendations: - TTE is pending.  - Continue home Eliquis from a stroke prevention standpoint.  No need of adding antiplatelet at this time MRA head without contrast - Vascular Surgery consult regarding her asymptomatic left ICA severe stenosis in the setting of chronic right ICA occlusion. May or may not be a good candidate for left CEA - to be determined by Vascular Surgery - A1c-goal less than 7-management per primary team - Lipid panel-LDL goal less than 70. Currently on atorvastatin 40. Management per primary team  - Given osmotic demyelination as the most likely etiology for her subacute pons lesion (estimated age of the lesion on MRI correlates with the time course of her presenting gait instability), at this time needs therapy assessments and management of metabolic derangements-hyperglycemia. She will need improved glycemic control at home  Electronically signed: Dr. Caryl Pina'

## 2023-07-18 NOTE — Progress Notes (Signed)
 [facility] night cross cover note:   I was notified by RN that the patient is exhibiting e/o intermittent episodes of apnea while asleep. Order placed for nocturnal CPAP.     Newton Pigg, DO Hospitalist

## 2023-07-18 NOTE — Evaluation (Signed)
 Occupational Therapy Evaluation Patient Details Name: Paula Wheeler MRN: [id number] DOB: Aug 15, 1950 Today's Date: 07/18/2023   History of Present Illness   Pt is a 73 y.o. female presenting 07/16/23 with severe unsteadiness with standing and hx of at least x5 falls recently. 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 PMH DM2, HLD, anemia, GERD, HTN, A-fib     Clinical Impressions Patient was independent prior to admission, although in conversation she has modified her life style to accommodate some of her deficits.  She is currently supervision to min A with adls.  Recommending home health OT and home health aide if possible. Patient is for d/c today so will sign off.      If plan is discharge home, recommend the following:   A little help with walking and/or transfers;A little help with bathing/dressing/bathroom;Assistance with cooking/housework;Assist for transportation;Help with stairs or ramp for entrance     Functional Status Assessment   Patient has had a recent decline in their functional status and demonstrates the ability to make significant improvements in function in a reasonable and predictable amount of time.     Equipment Recommendations   None recommended by OT     Recommendations for Other Services         Precautions/Restrictions   Precautions Precautions: Fall Recall of Precautions/Restrictions: Intact Restrictions Weight Bearing Restrictions Per Provider Order: No     Mobility Bed Mobility         Supine to sit: Independent          Transfers Overall transfer level: Needs assistance Equipment used: Rolling walker (2 wheels) Transfers: Sit to/from Stand Sit to Stand: Supervision                  Balance Overall balance assessment: Needs assistance Sitting-balance support: Feet supported, No upper extremity  supported Sitting balance-Leahy Scale: Good                                     ADL either performed or assessed with clinical judgement   ADL Overall ADL's : Needs assistance/impaired Eating/Feeding: Independent   Grooming: Standing   Upper Body Bathing: Supervision/ safety   Lower Body Bathing: Minimal assistance   Upper Body Dressing : Supervision/safety   Lower Body Dressing: Minimal assistance   Toilet Transfer: Supervision/safety   Toileting- Clothing Manipulation and Hygiene: Minimal assistance   Tub/ Shower Transfer: Minimal assistance     General ADL Comments: Patient needs verbal cues for safe use of walker during adls.  She has impaired balance and fatiques with activity     Vision   Vision Assessment?: No apparent visual deficits     Perception         Praxis         Pertinent Vitals/Pain Pain Assessment Pain Assessment: Faces Faces Pain Scale: No hurt     Extremity/Trunk Assessment Upper Extremity Assessment Upper Extremity Assessment: Generalized weakness;Right hand dominant       Cervical / Trunk Assessment Cervical / Trunk Assessment: Kyphotic   Communication Communication Communication: No apparent difficulties   Cognition Arousal: Alert Behavior During Therapy: WFL for tasks assessed/performed                                 Following  commands: Intact       Cueing  General Comments   Cueing Techniques: Verbal cues  Brother present   Exercises     Shoulder Instructions      Home Living Family/patient expects to be discharged to:: Private residence Living Arrangements: Alone Available Help at Discharge: Family;Available 24 hours/day Type of Home: House Home Access: Other (comment)     Home Layout: One level     Bathroom Shower/Tub: Tub/shower unit   Bathroom Toilet: Handicapped height     Home Equipment: Rolling Walker (2 wheels);Grab bars - toilet;Grab bars - tub/shower;Shower  seat;Cane - single point   Additional Comments: Sleeps in a recliner      Prior Functioning/Environment Prior Level of Function : Independent/Modified Independent;Working/employed             Mobility Comments: has been using RW over the past year ADLs Comments: Patient has modified her adls wearing easy wear clothes, microwave dinners etc.    OT Problem List: Decreased strength;Decreased activity tolerance;Impaired balance (sitting and/or standing);Decreased safety awareness   OT Treatment/Interventions:        OT Goals(Current goals can be found in the care plan section)       OT Frequency:       Co-evaluation              AM-PAC OT "6 Clicks" Daily Activity     Outcome Measure Help from another person eating meals?: None Help from another person taking care of personal grooming?: A Little Help from another person toileting, which includes using toliet, bedpan, or urinal?: A Little Help from another person bathing (including washing, rinsing, drying)?: A Little Help from another person to put on and taking off regular upper body clothing?: A Little Help from another person to put on and taking off regular lower body clothing?: A Little 6 Click Score: 19   End of Session Equipment Utilized During Treatment: Rolling walker (2 wheels) Nurse Communication: Mobility status  Activity Tolerance: Patient tolerated treatment well Patient left: in chair;with call bell/phone within reach;with family/visitor present  OT Visit Diagnosis: Unsteadiness on feet (R26.81);Repeated falls (R29.6);Muscle weakness (generalized) (M62.81);Dizziness and giddiness (R42)                Time: 1610-9604 OT Time Calculation (min): 23 min Charges:  OT General Charges $OT Visit: 1 Visit OT Evaluation $OT Eval Low Complexity: 1 Low OT Treatments $Self Care/Home Management : 8-22 mins  Hal Neer OTR/L   Malachi Bonds 07/18/2023, 3:48 PM

## 2023-07-18 NOTE — Progress Notes (Signed)
 Dr. Arlean Hopping was made aware of the following:  Patient has become very apneic while sleeping and needs a CPAP. Also she had a 2 second pause on the monitor.  Response:  Leads were changed on the patient and Dr. Arlean Hopping ordered a CPAP for night time. Will continue to monitor patient.

## 2023-07-18 NOTE — TOC Transition Note (Signed)
 Transition of Care [facility]) - Discharge Note   Patient Details  Name: Paula Wheeler MRN: [id number] Date of Birth: 1950/08/18  Transition of [facility]) CM/SW Contact:  Gordy Clement, RN Phone Number: 07/18/2023, 2:00 PM   Clinical Narrative:     Patient will DC to home today. Home Health PT will be provided by [facility]. AVS has been updated. Brother to transport home.           Patient Goals and CMS Choice            Discharge Placement                       Discharge Plan and Services Additional resources added to the After Visit Summary for                                       Social Drivers of Health (SDOH) Interventions SDOH Screenings   Food Insecurity: No Food Insecurity (07/17/2023)  Housing: Low Risk  (07/17/2023)  Transportation Needs: No Transportation Needs (07/17/2023)  Utilities: Not At Risk (07/17/2023)  Social Connections: Socially Isolated (07/17/2023)  Tobacco Use: High Risk (07/16/2023)     Readmission Risk Interventions     No data to display

## 2023-07-18 NOTE — Progress Notes (Signed)
 Echocardiogram 2D Echocardiogram has been performed.  Lucendia Herrlich 07/18/2023, 12:13 PM

## 2023-08-12 NOTE — Progress Notes (Signed)
 Cardiology Office Note   Date:  4/23/Wheeler   ID:  Paula Wheeler, Paula Wheeler 07-Apr-1951, MRN [id number]  PCP:  Jimmey Mould, MD  Cardiologist:   Ahmad Alert, MD   Chief Complaint  Patient presents with   Hypertension        Atrial Fibrillation   Problem list 1. Essential HTN  2. Hyperlipidemia 3. Cardiomegaly on x-ray 4. Diabetes mellitus 5. Obesity  6. Atrial fib -CHADS2VASC score is 22 ( female, age 73, HTN, PVD)   Paula Wheeler: [name] is a 73 y.o. female who presents for evaluation of cardiomegaly Paula Wheeler requested a CXR to look for lung issues ( is a current smoker) was found have cardiac megaly.  Paula Wheeler main complaint is that Paula Wheeler legs give out when Paula Wheeler is walking. Paula Wheeler does not get any regular exercise.  No CP.   Some shortness of breath with exertion  Has DM,   Paula Wheeler is getting better control of Paula Wheeler glucose levels recently .    Paula Wheeler admits that Paula Wheeler eats more salt that Paula Wheeler probably should. Paula Wheeler's canned soup was on occasions. Paula Wheeler put salt on Paula Wheeler apples.  Paula Wheeler:  Pt was seen by Dr. Alvenia Aus for PVD. Was found to have atrial fib.   CHADS2VASC score is 38 ( female, age 35, HTN, PVD)  Started on Eliquis . Developed a GI bleed.  EGD shows erosive gastropathy.   Paula Wheeler was told to stop Paula Wheeler Goodies powders and Indomethicin ( which Paula Wheeler was taking for gout )  Paula Wheeler has seen Dr. Denece Finger -  Paula Wheeler decided to reduce Paula Wheeler Eliquis  to 5 mg a day .    Paula Wheeler:  Pt is seen back for follow Up of Paula Wheeler atrial fibrillation. Paula Wheeler was started on L course but Paula Wheeler developed a GI bleed. Paula Wheeler had been taking lots of goodies powders and indomethacin for gout.  Paula Wheeler:  No symptoms related to atrial fib. Still smoking.  Paula Wheeler hands are cold at night  BP is up today .  Paula Wheeler Quinipril was reduced to once a day when Paula Wheeler was in the hospital in jan. Wheeler.   Paula Wheeler:  Was cardioverted on Paula 11 Unfortunately, is back in atrial fib Paula Wheeler cannot tell that Paula Wheeler HR is irreg.   Remains on Eliquis    Echo from 12. /17 showed normal Lv systolic function   Still smoking   04/22/17:  Mother died recently . No CP or dyspnea  Hx of atria fib   Paula Wheeler:  [name] is seen for follow up of atrial fib  Is having difficulty breathing and Paula Wheeler legs are swollen  Dyspnea for the past 3 weeks.  Not associated with any CP Has cut back on Paula Wheeler smoking  Wt today is 226.  ( up 15 lbs since Dec. Wheeler)  Has lack of energy . Seems to feel better when Paula Wheeler is on prednisone   Paula Wheeler is in persistent atrial fibrillation.  We cardioverted Paula Wheeler about a year ago but it only lasted for several weeks.  We discussed going to the A. fib clinic for consideration of Tikosyn therapy.  Paula Wheeler: Seen today for follow-up of Paula Wheeler atrial fibrillation, hypertension hyperlipidemia. Was started on Pletal - has not started it until Paula Wheeler cleared it with me  Still smoking  Previously, we had Paula Wheeler on losartan but it affected Paula Wheeler renal function.  We also had Paula Wheeler on some amlodipine  but this caused significant leg  edema.  Paula Wheeler has stopped both of these medications and Paula Wheeler blood pressure remains well controlled.  Paula Wheeler: [name] is seen today for follow-up of Paula Wheeler atrial fibrillation.  Paula Wheeler also has hypertension and hyperlipidemia.  Paula Wheeler has been anemic. Hemoglobin was 5.8.  Paula Wheeler received 3 units packed red blood cells.  Paula Wheeler has chronic kidney disease stage IV. Paula Wheeler had not noticed any blood in Paula Wheeler stool or black tarry stool   Is scheduled for EGD and colonoscopy Nov. 14,  Our    Jan. 12, Wheeler [name] is seen today for follow up of Paula Wheeler atrial fib,  HTN, hyperlipidemia. Still smoking .   Is in Afib   Paula Wheeler:  Is seen today for follow-up of Paula Wheeler atrial fibrillation, hypertension, hyperlipidemia.  Paula Wheeler has normal left ventricular systolic function by echocardiogram in Paula, [place].  We were not able to determine diastolic function.  There were no significant valve  problems.   Was in the hospital in [place] with respiratory failure and metabolic acidosis, acute kidney injury ,   Has edema in Paula Wheeler legs .  Has stopped smoking Has been to nephrology ( [facility])   Is taking torsemide  20 mg PO BID ( not daily as listed in the chart )   Paula Wheeler: [name] is seen for follow up of Paula Wheeler chronic diastolic CHF, atrial fib, HTN, HLD Has lots of claudiation issues Sees Dr. Fulton Job at [facility] .  Wt is 235 lbs.   Paula Wheeler is on atorvastatin  80 mg a day.  Paula Wheeler lipid levels from Paula Wheeler primary medical doctor look good. Total cholesterol is 87 HDL is 28 LDL is 38 Triglyceride level is 111.   Paula Wheeler: [name] is seen for follow up of Paula Wheeler chronic diastolic chf, atrial fib, HTN, HLD  Has lots of claudication issues Sees dr. Fulton Job at [facility]   Complains of swelling in Paula Wheeler legs  Has started drinking 2 diet sodas a day  Stays away from processed meats  Left leg is much more swollen than the right Also CKD Still smokes several cigarettes a day    Paula Wheeler [name] is seen for follow up of Paula Wheeler chronic diastolic CHF, atrial fib, HTN, HLD, CKD Has claudication Sees Dr. Fulton Job at [facility]  Has bilateral ICA disease  Is seeing Dr. Fulton Job on 08/06/22  Paula Wheeler is not entirely sure that the if Paula Wheeler wants to proceed with carotid endarterectomy.  Will go ahead and order a Lexiscan Myoview study.  If the Myoview study does not show severe anterior ischemia the then I think that Paula Wheeler should be able to make it through Paula Wheeler surgery without significant cardiovascular risk.  Paula Wheeler does have significant COPD and I have suggested that Paula Wheeler see a pulmonologist to tune Paula Wheeler lungs up prior to carotid endarterectomy.  If Paula Wheeler does not have carotid endarterectomy Paula Wheeler inquired as to how Paula Wheeler could best reduce Paula Wheeler risk of stroke.  We discussed smoking cessation and further lowering of Paula Wheeler cholesterol is being options.  I would probably also suggest adding Plavix to Paula Wheeler Eliquis  dose to reduce risk of  stroke.   Paula Wheeler [name] is seen for follow up of Paula Wheeler atrial fib,  Paula Wheeler has signficiant COPD,  carotid artery disease  Seen with brother , Jonell Neptune, [facility])  Was in the hospital several weeks ago  Due to frequent falls   Has severe COPD, atrial fib     Past Medical History:  Diagnosis Date   Anemia  COPD (chronic obstructive pulmonary disease) (HCC)    Dyslipidemia    Exogenous obesity    GERD (gastroesophageal reflux disease)    GI bleed 01/16/[place]   History of abnormal cells from cervix    Hyperlipidemia    Hypertension    Iron  deficiency anemia    Longstanding persistent atrial fibrillation (HCC)    Microalbuminuria    Tobacco dependence    Type II diabetes mellitus (HCC)     Past Surgical History:  Procedure Laterality Date   CARDIOVERSION N/A 4/11/[place]   Procedure: CARDIOVERSION;  Surgeon: Lake Pilgrim, MD;  [facility] ENDOSCOPY;  Service: Cardiovascular;  Laterality: N/A;   DIAGNOSTIC MAMMOGRAM  02/[place]   ESOPHAGOGASTRODUODENOSCOPY N/A 1/17/[place]eler   Procedure: ESOPHAGOGASTRODUODENOSCOPY (EGD);  Surgeon: Celedonio Coil, MD;  Location: [facility] ENDOSCOPY;  Service: Endoscopy;  Laterality: N/A;   EXPLORATORY LAPAROTOMY  1968   "I think they left my appendix"   PAP SMEAR  2012 AND [place]     Current Outpatient Medications  Medication Sig Dispense Refill   acetaminophen  (TYLENOL ) 500 MG tablet Take 500 mg by mouth every 6 (six) hours as needed for mild pain (pain score 1-3) or moderate pain (pain score 4-6).     albuterol  (PROVENTIL  HFA;VENTOLIN  HFA) 108 (90 Base) MCG/ACT inhaler Inhale 2 puffs into the lungs every 4 (four) hours as needed for wheezing or shortness of breath.     allopurinol  (ZYLOPRIM ) 100 MG tablet Take 100 mg by mouth in the morning.     amLODipine  (NORVASC ) 5 MG tablet Take 5 mg by mouth every morning.     apixaban  (ELIQUIS ) 5 MG TABS tablet TAKE 1 TABLET(5 MG) BY MOUTH TWICE DAILY 180 tablet 1   atorvastatin  (LIPITOR) 80 MG tablet Take 1  tablet (80 mg total) by mouth at bedtime. 90 tablet 0   bisoprolol  (ZEBETA ) 5 MG tablet TAKE 1 TABLET(5 MG) BY MOUTH DAILY (Patient taking differently: Take 5 mg by mouth in the morning.) 90 tablet 3   Cyanocobalamin (B-12 PO) Take 1 tablet by mouth in the morning.     escitalopram  (LEXAPRO ) 10 MG tablet Take 10 mg by mouth as needed.     ezetimibe  (ZETIA ) 10 MG tablet Take 1 tablet (10 mg total) by mouth daily. (Patient taking differently: Take 10 mg by mouth in the morning.) 90 tablet 3   ferrous sulfate  325 (65 FE) MG tablet Take 1 tablet (325 mg total) by mouth daily with breakfast. 30 tablet 3   Fluticasone -Umeclidin-Vilant (TRELEGY ELLIPTA ) 100-62.5-25 MCG/ACT AEPB INHALE 1 PUFF INTO THE LUNGS DAILY (Patient taking differently: Inhale 1 puff into the lungs in the morning.) 60 each 9   folic acid  (FOLVITE ) 1 MG tablet Take 1 tablet (1 mg total) by mouth daily. (Patient taking differently: Take 1 mg by mouth in the morning.) 30 tablet 0   HYDROcodone -acetaminophen  (NORCO/VICODIN) 5-325 MG tablet Take 1 tablet by mouth every 6 (six) hours as needed for moderate pain.     insulin  NPH-regular Human (70-30) 100 UNIT/ML injection Inject 6 Units into the skin 2 (two) times daily with a meal. Inject 30 units into the skin in the morning and 70 units into the skin in the evening (Patient taking differently: Inject 30-60 Units into the skin 2 (two) times daily with a meal. Inject 30 units into the skin in the morning and 60 units into the skin in the evening) 10 mL 11   Insulin  Syringe-Needle U-100 31G X 5/16" 1 ML MISC by Does not  apply route.     loratadine  (CLARITIN ) 10 MG tablet Take 10 mg by mouth in the morning.     meclizine (ANTIVERT) 12.5 MG tablet Take 1-2 tablets by mouth as needed for dizziness or nausea.     metolazone (ZAROXOLYN) 2.5 MG tablet Take 2.5 mg by mouth daily as needed.     Omega-3 Fatty Acids (FISH OIL) 1000 MG CAPS Take 1 capsule by mouth in the morning.     pantoprazole   (PROTONIX ) 40 MG tablet Take 40 mg by mouth in the morning.     tiZANidine (ZANAFLEX) 4 MG tablet Take 4 mg by mouth as needed for muscle spasms.     torsemide  (DEMADEX ) 20 MG tablet 60mg  (3 tablets) in the morning and 20mg  (1 tablet) in the afternoon     No current facility-administered medications for this visit.    Allergies:   Nsaids, Penicillins, and Quinapril  hcl    Social History:  The patient  reports that Paula Wheeler has been smoking cigarettes. Paula Wheeler has a 20 pack-year smoking history. Paula Wheeler has never been exposed to tobacco smoke. Paula Wheeler has never used smokeless tobacco. Paula Wheeler reports current alcohol use. Paula Wheeler reports that Paula Wheeler does not use drugs.   Family History:  The patient's family history includes Cancer in Paula Wheeler mother; Colon polyps in Paula Wheeler brother, brother, brother, brother, brother, and brother; Diabetes in Paula Wheeler father; Heart attack in Paula Wheeler father; Hypertension in Paula Wheeler mother; Macular degeneration in Paula Wheeler mother.    ROS:  ROS is reviewed in HPI . Otherwise is negative    Physical Exam: Blood pressure 118/62, pulse (!) 102, height 5\' 3"  (1.6 m), weight 190 lb (86.2 kg), SpO2 98%.       GEN:  Well nourished, well developed in no acute distress HEENT: Normal NECK: No JVD; No carotid bruits LYMPHATICS: No lymphadenopathy CARDIAC:   irreg irreg.  RESPIRATORY:  Clear to auscultation without rales, wheezing or rhonchi  ABDOMEN: Soft, non-tender, non-distended MUSCULOSKELETAL:  No edema; No deformity  SKIN: Warm and dry NEUROLOGIC:  Alert and oriented x 3   EKG:         Recent Labs: 3/20/Wheeler: BUN 16; Creatinine, Ser 1.57; Hemoglobin 10.3; Platelets 217; Potassium 3.1; Sodium 136; TSH 0.896    Lipid Panel    Component Value Date/Time   CHOL 149 03/20/Wheeler 0505   CHOL 93 (L) 04/04/Wheeler 0837   TRIG 131 03/20/Wheeler 0505   HDL 28 (L) 03/20/Wheeler 0505   HDL 32 (L) 04/04/Wheeler 0837   CHOLHDL 5.3 03/20/Wheeler 0505   VLDL 26 03/20/Wheeler 0505   LDLCALC 95 03/20/Wheeler 0505   LDLCALC 40  04/04/Wheeler 0837      Wt Readings from Last 3 Encounters:  08/20/23 190 lb (86.2 kg)  07/16/23 177 lb (80.3 kg)  10/25/22 188 lb 9.6 oz (85.5 kg)      Other studies Reviewed: Additional studies/ records that were reviewed today include: . Review of the above records demonstrates:    ASSESSMENT AND PLAN:    Severe bilateral carotid stenosis:    Paula Wheeler has been followed by Dr. Fulton Job.  Paula Wheeler has asymptomatic severe carotid disease .  Paula Wheeler continues to smoke.  We had previously ordered a Lexiscan myoview study in [place]  but it Paula Wheeler was unable to get it done   Paula Wheeler is now very weak.  Paula Wheeler overall condition has continued to gradually decline.   Was unable to go from wheelchair to the exam table .   If Paula Wheeler is still a candidate for  TCAR and Paula Wheeler would like to proceed, we will order another Lexiscan myoview  Paula Wheeler will follow up with Dr. Fulton Job     1.   Atrial fib :      stable.  Cont eliquis     2.   acute on chronic diastolic congestive heart failure:   Stable      2. Essential hypertension:  BP is well controlled.    3. Claudication:      Sees [facility]. [facility]ulton Job at [facility]    4. Hyperlipidemia: stable       5.  Chronic kidney disease:    -      Current medicines are reviewed at length with the patient today.  The patient does not have concerns regarding medicines.  Labs/ tests ordered today include:   No orders of the defined types were placed in this encounter.    Ahmad Alert, MD  4/23/Wheeler 8:13 AM    [facility] [address] [place], [place], [place]  [address] Phone: [phone]; Fax: [phone]

## 2023-08-20 ENCOUNTER — Encounter: Payer: Self-pay | Admitting: Cardiovascular Disease

## 2023-08-20 ENCOUNTER — Encounter: Payer: Self-pay | Admitting: Podiatry

## 2023-08-20 ENCOUNTER — Ambulatory Visit: Attending: Cardiovascular Disease | Admitting: Cardiovascular Disease

## 2023-08-20 ENCOUNTER — Ambulatory Visit (INDEPENDENT_AMBULATORY_CARE_PROVIDER_SITE_OTHER): Admitting: Podiatry

## 2023-08-20 DIAGNOSIS — E118 Type 2 diabetes mellitus with unspecified complications: Secondary | ICD-10-CM

## 2023-08-20 DIAGNOSIS — B351 Tinea unguium: Secondary | ICD-10-CM | POA: Diagnosis not present

## 2023-08-20 DIAGNOSIS — M79674 Pain in right toe(s): Secondary | ICD-10-CM | POA: Diagnosis not present

## 2023-08-20 DIAGNOSIS — I4819 Other persistent atrial fibrillation: Secondary | ICD-10-CM | POA: Diagnosis not present

## 2023-08-20 DIAGNOSIS — M79675 Pain in left toe(s): Secondary | ICD-10-CM | POA: Diagnosis not present

## 2023-08-20 DIAGNOSIS — L97501 Non-pressure chronic ulcer of other part of unspecified foot limited to breakdown of skin: Secondary | ICD-10-CM | POA: Insufficient documentation

## 2023-08-20 MED ORDER — ATORVASTATIN CALCIUM 80 MG PO TABS: 80.0000 mg | ORAL_TABLET | Freq: Every day | ORAL | 3 refills | Status: DC

## 2023-08-20 MED ORDER — APIXABAN 5 MG PO TABS: 5.0000 mg | ORAL_TABLET | Freq: Two times a day (BID) | ORAL | 1 refills | Status: DC

## 2023-08-20 NOTE — Patient Instructions (Signed)
 Medication Instructions:  REFILLED Eliauis and Atorvastatin  *If you need a refill on your cardiac medications before your next appointment, please call your pharmacy*  Follow-Up: At [facility], you and your health needs are our priority.  As part of our continuing mission to provide you with exceptional heart care, our providers are all part of one team.  This team includes your primary Cardiologist (physician) and Advanced Practice Providers or APPs (Physician Assistants and Nurse Practitioners) who all work together to provide you with the care you need, when you need it.  Your next appointment:   1 year(s)  Provider:   Ahmad Alert, MD        1st Floor: - Lobby - Registration  - Pharmacy  - Lab - Cafe  2nd Floor: - PV Lab - Diagnostic Testing (echo, CT, nuclear med)  3rd Floor: - Vacant  4th Floor: - TCTS (cardiothoracic surgery) - AFib Clinic - Structural Heart Clinic - Vascular Surgery  - Vascular Ultrasound  5th Floor: - HeartCare Cardiology (general and EP) - Clinical Pharmacy for coumadin, hypertension, lipid, weight-loss medications, and med management appointments    Valet parking services will be available as well.

## 2023-08-20 NOTE — Progress Notes (Signed)
 This patient returns to my office for at risk foot care.  This patient requires this care by a professional since this patient will be at risk due to having diabetes.  This patient is unable to cut nails herself since the patient cannot reach her nails.These nails are painful walking and wearing shoes. She also has injured her big toe right foot.  She said she fell and injured her toe.  She says her medical doctor called her in a prescription of doxycycline.  She says there is no infection but would like me to examine her right big toe.  This patient presents for at risk foot care today.  General Appearance  Alert, conversant and in no acute stress.  Vascular  Dorsalis pedis and posterior tibial  pulses are palpable  bilaterally.  Capillary return is within normal limits  bilaterally. Temperature is within normal limits  bilaterally.  Neurologic  Senn-Weinstein monofilament wire test within normal limits  bilaterally. Muscle power within normal limits bilaterally.  Nails Thick disfigured discolored nails with subungual debris  from hallux to fifth toes bilaterally. No evidence of bacterial infection or drainage bilaterally.  Orthopedic  No limitations of motion  feet .  No crepitus or effusions noted.  Hammer toes 2,3 left foot.  Skin  normotropic skin with no porokeratosis noted bilaterally.  No signs of infections or ulcers  Clavi third toe left and second toe left  noted.   There is an ulcer on the distal aspect right hallux.  No signs of drainage redness or infection.    Onychomycosis  Pain in right toes  Pain in left toes  Ulcer right hallux healing.  Consent was obtained for treatment procedures.   Mechanical debridement of nails 1-5  bilaterally performed with a nail nipper.  Filed with dremel without incident.  Ulcer was bandaged with neosporin/DSD and coban.     Return office visit    10 weeks                Told patient to return for periodic foot care and evaluation due to potential at  risk complications.   Ruffin Cotton DPM

## 2023-08-25 ENCOUNTER — Ambulatory Visit: Admitting: Podiatry

## 2023-09-27 ENCOUNTER — Emergency Department (HOSPITAL_COMMUNITY)

## 2023-09-27 ENCOUNTER — Encounter (HOSPITAL_COMMUNITY): Payer: Self-pay

## 2023-09-27 ENCOUNTER — Other Ambulatory Visit: Payer: Self-pay

## 2023-09-27 ENCOUNTER — Inpatient Hospital Stay (HOSPITAL_COMMUNITY)
Admission: EM | Admit: 2023-09-27 | Discharge: 2023-09-30 | DRG: 871 | Disposition: A | Discharge status: Home Health | Attending: Internal Medicine | Admitting: Internal Medicine

## 2023-09-27 DIAGNOSIS — N179 Acute kidney failure, unspecified: Secondary | ICD-10-CM | POA: Diagnosis present

## 2023-09-27 DIAGNOSIS — I5033 Acute on chronic diastolic (congestive) heart failure: Secondary | ICD-10-CM | POA: Diagnosis present

## 2023-09-27 DIAGNOSIS — J189 Pneumonia, unspecified organism: Secondary | ICD-10-CM | POA: Diagnosis present

## 2023-09-27 DIAGNOSIS — Z6833 Body mass index (BMI) 33.0-33.9, adult: Secondary | ICD-10-CM

## 2023-09-27 DIAGNOSIS — J9601 Acute respiratory failure with hypoxia: Secondary | ICD-10-CM | POA: Diagnosis present

## 2023-09-27 DIAGNOSIS — E441 Mild protein-calorie malnutrition: Secondary | ICD-10-CM | POA: Diagnosis present

## 2023-09-27 DIAGNOSIS — J159 Unspecified bacterial pneumonia: Secondary | ICD-10-CM | POA: Diagnosis present

## 2023-09-27 DIAGNOSIS — F17210 Nicotine dependence, cigarettes, uncomplicated: Secondary | ICD-10-CM | POA: Diagnosis present

## 2023-09-27 DIAGNOSIS — E1165 Type 2 diabetes mellitus with hyperglycemia: Secondary | ICD-10-CM | POA: Diagnosis present

## 2023-09-27 DIAGNOSIS — E785 Hyperlipidemia, unspecified: Secondary | ICD-10-CM | POA: Diagnosis present

## 2023-09-27 DIAGNOSIS — J440 Chronic obstructive pulmonary disease with acute lower respiratory infection: Secondary | ICD-10-CM | POA: Diagnosis present

## 2023-09-27 DIAGNOSIS — I130 Hypertensive heart and chronic kidney disease with heart failure and stage 1 through stage 4 chronic kidney disease, or unspecified chronic kidney disease: Secondary | ICD-10-CM | POA: Diagnosis present

## 2023-09-27 DIAGNOSIS — Z7901 Long term (current) use of anticoagulants: Secondary | ICD-10-CM

## 2023-09-27 DIAGNOSIS — Z720 Tobacco use: Secondary | ICD-10-CM | POA: Diagnosis present

## 2023-09-27 DIAGNOSIS — E1129 Type 2 diabetes mellitus with other diabetic kidney complication: Secondary | ICD-10-CM | POA: Diagnosis present

## 2023-09-27 DIAGNOSIS — K219 Gastro-esophageal reflux disease without esophagitis: Secondary | ICD-10-CM | POA: Diagnosis present

## 2023-09-27 DIAGNOSIS — D509 Iron deficiency anemia, unspecified: Secondary | ICD-10-CM | POA: Diagnosis present

## 2023-09-27 DIAGNOSIS — E1122 Type 2 diabetes mellitus with diabetic chronic kidney disease: Secondary | ICD-10-CM | POA: Diagnosis present

## 2023-09-27 DIAGNOSIS — N184 Chronic kidney disease, stage 4 (severe): Secondary | ICD-10-CM | POA: Diagnosis present

## 2023-09-27 DIAGNOSIS — I4819 Other persistent atrial fibrillation: Secondary | ICD-10-CM | POA: Diagnosis present

## 2023-09-27 DIAGNOSIS — Z23 Encounter for immunization: Secondary | ICD-10-CM

## 2023-09-27 DIAGNOSIS — I4811 Longstanding persistent atrial fibrillation: Secondary | ICD-10-CM | POA: Diagnosis present

## 2023-09-27 DIAGNOSIS — J441 Chronic obstructive pulmonary disease with (acute) exacerbation: Secondary | ICD-10-CM | POA: Diagnosis present

## 2023-09-27 DIAGNOSIS — Z794 Long term (current) use of insulin: Secondary | ICD-10-CM

## 2023-09-27 DIAGNOSIS — A419 Sepsis, unspecified organism: Principal | ICD-10-CM | POA: Diagnosis present

## 2023-09-27 DIAGNOSIS — E1151 Type 2 diabetes mellitus with diabetic peripheral angiopathy without gangrene: Secondary | ICD-10-CM | POA: Diagnosis present

## 2023-09-27 DIAGNOSIS — J9621 Acute and chronic respiratory failure with hypoxia: Principal | ICD-10-CM

## 2023-09-27 DIAGNOSIS — Z79899 Other long term (current) drug therapy: Secondary | ICD-10-CM | POA: Diagnosis not present

## 2023-09-27 DIAGNOSIS — I517 Cardiomegaly: Secondary | ICD-10-CM | POA: Insufficient documentation

## 2023-09-27 DIAGNOSIS — E6609 Other obesity due to excess calories: Secondary | ICD-10-CM | POA: Diagnosis present

## 2023-09-27 DIAGNOSIS — I10 Essential (primary) hypertension: Secondary | ICD-10-CM | POA: Diagnosis present

## 2023-09-27 DIAGNOSIS — I779 Disorder of arteries and arterioles, unspecified: Secondary | ICD-10-CM | POA: Diagnosis present

## 2023-09-27 DIAGNOSIS — I739 Peripheral vascular disease, unspecified: Secondary | ICD-10-CM | POA: Diagnosis present

## 2023-09-27 LAB — GLUCOSE, CAPILLARY
Glucose-Capillary: 298 mg/dL — ABNORMAL HIGH (ref 70–99)
Glucose-Capillary: 259 mg/dL — ABNORMAL HIGH (ref 70–99)

## 2023-09-27 LAB — CBC
HCT: 32.5 % — ABNORMAL LOW (ref 36.0–46.0)
Hemoglobin: 10.2 g/dL — ABNORMAL LOW (ref 12.0–15.0)
MCH: 31.8 pg (ref 26.0–34.0)
MCHC: 31.4 g/dL (ref 30.0–36.0)
MCV: 101.2 fL — ABNORMAL HIGH (ref 80.0–100.0)
Platelets: 240 10*3/uL (ref 150–400)
RBC: 3.21 MIL/uL — ABNORMAL LOW (ref 3.87–5.11)
RDW: 16.7 % — ABNORMAL HIGH (ref 11.5–15.5)
WBC: 15.2 10*3/uL — ABNORMAL HIGH (ref 4.0–10.5)
nRBC: 0 % (ref 0.0–0.2)

## 2023-09-27 LAB — HEPATIC FUNCTION PANEL
ALT: 13 U/L (ref 0–44)
AST: 23 U/L (ref 15–41)
Albumin: 3.4 g/dL — ABNORMAL LOW (ref 3.5–5.0)
Alkaline Phosphatase: 83 U/L (ref 38–126)
Bilirubin, Direct: 0.4 mg/dL — ABNORMAL HIGH (ref 0.0–0.2)
Indirect Bilirubin: 1.3 mg/dL — ABNORMAL HIGH (ref 0.3–0.9)
Total Bilirubin: 1.7 mg/dL — ABNORMAL HIGH (ref 0.0–1.2)
Total Protein: 7 g/dL (ref 6.5–8.1)

## 2023-09-27 LAB — BASIC METABOLIC PANEL WITH GFR
Anion gap: 14 (ref 5–15)
Anion gap: 14 (ref 5–15)
BUN: 44 mg/dL — ABNORMAL HIGH (ref 8–23)
BUN: 44 mg/dL — ABNORMAL HIGH (ref 8–23)
CO2: 24 mmol/L (ref 22–32)
CO2: 25 mmol/L (ref 22–32)
Calcium: 9.9 mg/dL (ref 8.9–10.3)
Calcium: 9.9 mg/dL (ref 8.9–10.3)
Chloride: 98 mmol/L (ref 98–111)
Chloride: 99 mmol/L (ref 98–111)
Creatinine, Ser: 2.18 mg/dL — ABNORMAL HIGH (ref 0.44–1.00)
Creatinine, Ser: 1.84 mg/dL — ABNORMAL HIGH (ref 0.44–1.00)
GFR, Estimated: 23 mL/min — ABNORMAL LOW (ref >60)
GFR, Estimated: 29 mL/min — ABNORMAL LOW (ref >60)
Glucose, Bld: 336 mg/dL — ABNORMAL HIGH (ref 70–99)
Glucose, Bld: 260 mg/dL — ABNORMAL HIGH (ref 70–99)
Potassium: 3.9 mmol/L (ref 3.5–5.1)
Potassium: 4.1 mmol/L (ref 3.5–5.1)
Sodium: 136 mmol/L (ref 135–145)
Sodium: 138 mmol/L (ref 135–145)

## 2023-09-27 LAB — BRAIN NATRIURETIC PEPTIDE: B Natriuretic Peptide: 194.6 pg/mL — ABNORMAL HIGH (ref 0.0–100.0)

## 2023-09-27 LAB — TROPONIN I (HIGH SENSITIVITY)
Troponin I (High Sensitivity): 81 ng/L — ABNORMAL HIGH (ref <18)
Troponin I (High Sensitivity): 76 ng/L — ABNORMAL HIGH (ref <18)

## 2023-09-27 LAB — PHOSPHORUS: Phosphorus: 3.1 mg/dL (ref 2.5–4.6)

## 2023-09-27 LAB — MRSA NEXT GEN BY PCR, NASAL: MRSA by PCR Next Gen: NOT DETECTED

## 2023-09-27 LAB — MAGNESIUM
Magnesium: 1.9 mg/dL (ref 1.7–2.4)
Magnesium: 2 mg/dL (ref 1.7–2.4)

## 2023-09-27 LAB — LIPASE, BLOOD: Lipase: 43 U/L (ref 11–51)

## 2023-09-27 LAB — PROCALCITONIN: Procalcitonin: 0.32 ng/mL

## 2023-09-27 MED ORDER — ACETAMINOPHEN 650 MG RE SUPP: 650.0000 mg | Freq: Four times a day (QID) | RECTAL | Status: DC | PRN

## 2023-09-27 MED ORDER — SODIUM CHLORIDE 0.9 % IV SOLN
500.0000 mg | Freq: Once | INTRAVENOUS | Status: AC
  Administered 2023-09-27: 500 mg via INTRAVENOUS
  Filled 2023-09-27: qty 5

## 2023-09-27 MED ORDER — ACETAMINOPHEN 325 MG PO TABS: 650.0000 mg | ORAL_TABLET | Freq: Four times a day (QID) | ORAL | Status: DC | PRN

## 2023-09-27 MED ORDER — IPRATROPIUM-ALBUTEROL 0.5-2.5 (3) MG/3ML IN SOLN
3.0000 mL | Freq: Once | RESPIRATORY_TRACT | Status: AC
  Administered 2023-09-27: 3 mL via RESPIRATORY_TRACT
  Filled 2023-09-27: qty 3

## 2023-09-27 MED ORDER — ORAL CARE MOUTH RINSE: 15.0000 mL | OROMUCOSAL | Status: DC | PRN

## 2023-09-27 MED ORDER — FERROUS SULFATE 325 (65 FE) MG PO TABS
325.0000 mg | ORAL_TABLET | Freq: Every day | ORAL | Status: DC
  Administered 2023-09-28 – 2023-09-30 (×3): 325 mg via ORAL
  Filled 2023-09-27 (×3): qty 1

## 2023-09-27 MED ORDER — METOPROLOL TARTRATE 5 MG/5ML IV SOLN
5.0000 mg | Freq: Once | INTRAVENOUS | Status: AC
  Administered 2023-09-27: 5 mg via INTRAVENOUS
  Filled 2023-09-27: qty 5

## 2023-09-27 MED ORDER — ATORVASTATIN CALCIUM 40 MG PO TABS
80.0000 mg | ORAL_TABLET | Freq: Every day | ORAL | Status: DC
  Administered 2023-09-27 – 2023-09-29 (×3): 80 mg via ORAL
  Filled 2023-09-27 (×3): qty 2

## 2023-09-27 MED ORDER — ALLOPURINOL 100 MG PO TABS
100.0000 mg | ORAL_TABLET | Freq: Every morning | ORAL | Status: DC
  Administered 2023-09-28 – 2023-09-30 (×3): 100 mg via ORAL
  Filled 2023-09-27 (×3): qty 1

## 2023-09-27 MED ORDER — INSULIN ASPART PROT & ASPART (70-30 MIX) 100 UNIT/ML ~~LOC~~ SUSP
15.0000 [IU] | Freq: Two times a day (BID) | SUBCUTANEOUS | Status: DC
  Administered 2023-09-27 – 2023-09-29 (×5): 15 [IU] via SUBCUTANEOUS
  Filled 2023-09-27: qty 10

## 2023-09-27 MED ORDER — INSULIN ASPART 100 UNIT/ML IJ SOLN
0.0000 [IU] | Freq: Three times a day (TID) | INTRAMUSCULAR | Status: DC
  Administered 2023-09-27: 5 [IU] via SUBCUTANEOUS
  Administered 2023-09-28 (×3): 3 [IU] via SUBCUTANEOUS
  Administered 2023-09-29: 1 [IU] via SUBCUTANEOUS
  Administered 2023-09-29: 2 [IU] via SUBCUTANEOUS
  Administered 2023-09-29: 1 [IU] via SUBCUTANEOUS
  Administered 2023-09-30: 2 [IU] via SUBCUTANEOUS
  Administered 2023-09-30: 1 [IU] via SUBCUTANEOUS
  Filled 2023-09-27: qty 0.09

## 2023-09-27 MED ORDER — APIXABAN 5 MG PO TABS
5.0000 mg | ORAL_TABLET | Freq: Two times a day (BID) | ORAL | Status: DC
  Administered 2023-09-27 – 2023-09-30 (×6): 5 mg via ORAL
  Filled 2023-09-27 (×3): qty 1
  Filled 2023-09-27 (×2): qty 2
  Filled 2023-09-27: qty 1

## 2023-09-27 MED ORDER — HYDROCODONE-ACETAMINOPHEN 5-325 MG PO TABS
1.0000 | ORAL_TABLET | Freq: Four times a day (QID) | ORAL | Status: DC | PRN
  Administered 2023-09-27 – 2023-09-30 (×8): 1 via ORAL
  Filled 2023-09-27 (×8): qty 1

## 2023-09-27 MED ORDER — SODIUM CHLORIDE 0.9 % IV SOLN
2.0000 g | INTRAVENOUS | Status: DC
  Administered 2023-09-28 – 2023-09-30 (×3): 2 g via INTRAVENOUS
  Filled 2023-09-27 (×2): qty 20

## 2023-09-27 MED ORDER — PNEUMOCOCCAL 20-VAL CONJ VACC 0.5 ML IM SUSY: 0.5000 mL | PREFILLED_SYRINGE | INTRAMUSCULAR | Status: DC | PRN

## 2023-09-27 MED ORDER — FUROSEMIDE 10 MG/ML IJ SOLN
40.0000 mg | Freq: Once | INTRAMUSCULAR | Status: AC
  Administered 2023-09-27: 40 mg via INTRAVENOUS
  Filled 2023-09-27: qty 4

## 2023-09-27 MED ORDER — LEVALBUTEROL HCL 0.63 MG/3ML IN NEBU
0.6300 mg | INHALATION_SOLUTION | Freq: Four times a day (QID) | RESPIRATORY_TRACT | Status: DC
  Administered 2023-09-27 – 2023-09-29 (×7): 0.63 mg via RESPIRATORY_TRACT
  Filled 2023-09-27 (×7): qty 3

## 2023-09-27 MED ORDER — LEVALBUTEROL HCL 0.63 MG/3ML IN NEBU
0.6300 mg | INHALATION_SOLUTION | Freq: Four times a day (QID) | RESPIRATORY_TRACT | Status: DC | PRN
  Filled 2023-09-27: qty 3

## 2023-09-27 MED ORDER — SODIUM CHLORIDE 0.9 % IV SOLN
2.0000 g | Freq: Once | INTRAVENOUS | Status: AC
  Administered 2023-09-27: 2 g via INTRAVENOUS
  Filled 2023-09-27: qty 20

## 2023-09-27 MED ORDER — ONDANSETRON HCL 4 MG PO TABS: 4.0000 mg | ORAL_TABLET | Freq: Four times a day (QID) | ORAL | Status: DC | PRN

## 2023-09-27 MED ORDER — LORATADINE 10 MG PO TABS
10.0000 mg | ORAL_TABLET | Freq: Every morning | ORAL | Status: DC
  Administered 2023-09-28 – 2023-09-30 (×3): 10 mg via ORAL
  Filled 2023-09-27 (×3): qty 1

## 2023-09-27 MED ORDER — IPRATROPIUM-ALBUTEROL 0.5-2.5 (3) MG/3ML IN SOLN
3.0000 mL | Freq: Once | RESPIRATORY_TRACT | Status: AC
  Administered 2023-09-27: 3 mL via RESPIRATORY_TRACT

## 2023-09-27 MED ORDER — BUDESON-GLYCOPYRROL-FORMOTEROL 160-9-4.8 MCG/ACT IN AERO
2.0000 | INHALATION_SPRAY | Freq: Two times a day (BID) | RESPIRATORY_TRACT | Status: DC
  Administered 2023-09-27 – 2023-09-30 (×6): 2 via RESPIRATORY_TRACT
  Filled 2023-09-27: qty 5.9

## 2023-09-27 MED ORDER — ONDANSETRON HCL 4 MG/2ML IJ SOLN: 4.0000 mg | Freq: Four times a day (QID) | INTRAMUSCULAR | Status: DC | PRN

## 2023-09-27 MED ORDER — CHLORHEXIDINE GLUCONATE CLOTH 2 % EX PADS
6.0000 | MEDICATED_PAD | Freq: Every day | CUTANEOUS | Status: DC
  Administered 2023-09-27 – 2023-09-30 (×2): 6 via TOPICAL

## 2023-09-27 MED ORDER — SODIUM CHLORIDE 0.9 % IV SOLN
500.0000 mg | INTRAVENOUS | Status: DC
  Administered 2023-09-28 – 2023-09-29 (×2): 500 mg via INTRAVENOUS
  Filled 2023-09-27 (×4): qty 5

## 2023-09-27 MED ORDER — BISOPROLOL FUMARATE 5 MG PO TABS
5.0000 mg | ORAL_TABLET | Freq: Every morning | ORAL | Status: DC
  Administered 2023-09-28 – 2023-09-30 (×3): 5 mg via ORAL
  Filled 2023-09-27 (×3): qty 1

## 2023-09-27 NOTE — ED Notes (Signed)
I gave patient a cup of water per nurse 

## 2023-09-27 NOTE — ED Notes (Signed)
 Returned from CT, notified by CT tech, SPO2 83% RA, pt sob, wheezing increased WOB, neb repeated. EDP notified.

## 2023-09-27 NOTE — ED Notes (Signed)
 Pure wick placed onto pt per nurse request

## 2023-09-27 NOTE — ED Notes (Signed)
 EDP at [facility]

## 2023-09-27 NOTE — ED Notes (Signed)
 Into room from xray by w/c, alert, NAD, calm, interactive.

## 2023-09-27 NOTE — Progress Notes (Signed)
 RT called to assess patient in room WA03. RT increased pt 02 to 6 LPM F[place]. Pt has had neb tx at 11:15 am. Pt sounds crackles with sl wheeze. Pt has dose of lasix  due to 11:45. Pt can talk in full sentences with no distress noted. No further orders for RT at this time.

## 2023-09-27 NOTE — ED Notes (Addendum)
 NT ambulated pt with walker in hallway; pt's spO2 level stayed consistent between 82-84 while ambulating.

## 2023-09-27 NOTE — ED Triage Notes (Signed)
 Pt reports with shob x 2 days. Pt reports a hx of smoking cigarettes but reports not smoking since having the shob.

## 2023-09-27 NOTE — H&P (Signed)
 History and Physical    Patient: Paula Wheeler ZOX:[id number] DOB: [birth year] DOA: 09/27/2023 DOS: the patient was seen and examined on 09/27/2023 PCP: Jimmey Mould, MD  Patient coming from: Home  Chief Complaint:  Chief Complaint  Patient presents with   Shortness of Breath   HPI: LASHAWNA Wheeler is a 73 y.o. female with medical history significant of anemia, active cigarette smoking, COPD, hyperlipidemia, exogenous obesity, GERD, GI bleed, hypertension, iron  deficiency anemia, longstanding persistent atrial fibrillation, microalbuminuria, tobacco dependence, type 2 diabetes who presented to the emergency department with complaints of progressively worse dyspnea associated with wheezing and yellowish sputum producing cough for the past 2 days. She denied fever, chills, rhinorrhea, sore throat, wheezing or hemoptysis. No chest pain, palpitations, diaphoresis, PND, orthopnea, but occasionally pitting edema of the lower extremities. No abdominal pain, nausea, emesis, diarrhea, constipation, melena or hematochezia. No flank pain, dysuria, frequency or hematuria.  No polyuria, polydipsia, polyphagia or blurred vision.   Lab work: CBC showed white count of 15.2, hemoglobin 10.2 g/dL with an MCV of 811.9 fL and platelets 240.  Troponin was 81 then 76 ng/L and BNP 894.6 pg/mL.  BMP showed normal electrolytes.  Glucose 336, BUN 44 and creatinine 2.18 mg/dL.  Imaging: 2 view chest radiograph showed new right middle lobe opacity with tiny right pleural effusion suspicious for pneumonia.  Continued chest radiographic follow-up recommended to confirm resolution.  There is marked cardiac enlargement and diffuse pulmonary vascular congestion.   ED course: Initial vital signs were temperature 98.9 F, pulse 126, respiration 24, BP 154/71 mmHg O2 sat 96% on room air.  The patient received a DuoNeb, furosemide  40 mg IVP, ceftriaxone  2 g IVPB and azithromycin 500 mg IVPB.  Review of Systems: As  mentioned in the history of present illness. All other systems reviewed and are negative. Past Medical History:  Diagnosis Date   Anemia    COPD (chronic obstructive pulmonary disease) (HCC)    Dyslipidemia    Exogenous obesity    GERD (gastroesophageal reflux disease)    GI bleed 05/14/2016   History of abnormal cells from cervix    Hyperlipidemia    Hypertension    Iron  deficiency anemia    Longstanding persistent atrial fibrillation (HCC)    Microalbuminuria    Tobacco dependence    Type II diabetes mellitus (HCC)    Past Surgical History:  Procedure Laterality Date   CARDIOVERSION N/A 08/07/2016   Procedure: CARDIOVERSION;  Surgeon: Lake Pilgrim, MD;  Location: [facility] ENDOSCOPY;  Service: Cardiovascular;  Laterality: N/A;   DIAGNOSTIC MAMMOGRAM  05/2015   ESOPHAGOGASTRODUODENOSCOPY N/A 05/15/2016   Procedure: ESOPHAGOGASTRODUODENOSCOPY (EGD);  Surgeon: Celedonio Coil, MD;  Location: [facility] ENDOSCOPY;  Service: Endoscopy;  Laterality: N/A;   EXPLORATORY LAPAROTOMY  1968   "I think they left my appendix"   PAP SMEAR  2012 AND 2017   Social History:  reports that she has been smoking cigarettes. She has a 20 pack-year smoking history. She has never been exposed to tobacco smoke. She has never used smokeless tobacco. She reports current alcohol use. She reports that she does not use drugs.  Allergies  Allergen Reactions   Nsaids Swelling    Hands and legs swell   Penicillins Hives    Has patient had a PCN reaction causing immediate rash, facial/tongue/throat swelling, SOB or lightheadedness with hypotension: Yes Has patient had a PCN reaction causing severe rash involving mucus membranes or skin necrosis: No Has patient had a PCN reaction that  required hospitalization: No Has patient had a PCN reaction occurring within the last 10 years: No If all of the above answers are "NO", then may proceed with Cephalosporin use.    Quinapril  Hcl Other (See Comments)    Other reaction(s):  elevated potassium and creatinine    Family History  Problem Relation Age of Onset   Cancer Mother        BREAST   Hypertension Mother    Macular degeneration Mother    Heart attack Father    Diabetes Father    Colon polyps Brother    Colon polyps Brother    Colon polyps Brother    Colon polyps Brother    Colon polyps Brother    Colon polyps Brother     Prior to Admission medications   Medication Sig Start Date End Date Taking? Authorizing Provider  acetaminophen  (TYLENOL ) 500 MG tablet Take 500 mg by mouth every 6 (six) hours as needed for mild pain (pain score 1-3) or moderate pain (pain score 4-6).    [provider]  albuterol  (PROVENTIL  HFA;VENTOLIN  HFA) 108 (90 Base) MCG/ACT inhaler Inhale 2 puffs into the lungs every 4 (four) hours as needed for wheezing or shortness of breath.    [provider]  allopurinol  (ZYLOPRIM ) 100 MG tablet Take 100 mg by mouth in the morning.    [provider]  amLODipine  (NORVASC ) 5 MG tablet Take 5 mg by mouth every morning. 04/26/20   [provider]  apixaban  (ELIQUIS ) 5 MG TABS tablet Take 1 tablet (5 mg total) by mouth 2 (two) times daily. 08/20/23   Nahser, Lela Purple, MD  atorvastatin  (LIPITOR) 80 MG tablet Take 1 tablet (80 mg total) by mouth at bedtime. 08/20/23 11/18/23  Nahser, Lela Purple, MD  bisoprolol  (ZEBETA ) 5 MG tablet TAKE 1 TABLET(5 MG) BY MOUTH DAILY Patient taking differently: Take 5 mg by mouth in the morning. 07/30/22   Nahser, Lela Purple, MD  Cyanocobalamin (B-12 PO) Take 1 tablet by mouth in the morning.    [provider]  escitalopram  (LEXAPRO ) 10 MG tablet Take 10 mg by mouth as needed.    [provider]  ezetimibe  (ZETIA ) 10 MG tablet Take 1 tablet (10 mg total) by mouth daily. Patient taking differently: Take 10 mg by mouth in the morning. 07/29/22   Nahser, Lela Purple, MD  ferrous sulfate  325 (65 FE) MG tablet Take 1 tablet (325 mg total) by mouth daily with breakfast. 11/30/19    Regalado, Clifford Dam A, MD  Fluticasone -Umeclidin-Vilant (TRELEGY ELLIPTA ) 100-62.5-25 MCG/ACT AEPB INHALE 1 PUFF INTO THE LUNGS DAILY Patient taking differently: Inhale 1 puff into the lungs in the morning. 01/23/23   Wilfredo Hanly, MD  folic acid  (FOLVITE ) 1 MG tablet Take 1 tablet (1 mg total) by mouth daily. Patient taking differently: Take 1 mg by mouth in the morning. 11/30/19   Regalado, Belkys A, MD  HYDROcodone -acetaminophen  (NORCO/VICODIN) 5-325 MG tablet Take 1 tablet by mouth every 6 (six) hours as needed for moderate pain.    [provider]  insulin  NPH-regular Human (70-30) 100 UNIT/ML injection Inject 6 Units into the skin 2 (two) times daily with a meal. Inject 30 units into the skin in the morning and 70 units into the skin in the evening Patient taking differently: Inject 30-60 Units into the skin 2 (two) times daily with a meal. Inject 30 units into the skin in the morning and 60 units into the skin in the evening 11/29/19  Regalado, Belkys A, MD  Insulin  Syringe-Needle U-100 31G X 5/16" 1 ML MISC by Does not apply route.    [provider]  loratadine  (CLARITIN ) 10 MG tablet Take 10 mg by mouth in the morning.    [provider]  meclizine (ANTIVERT) 12.5 MG tablet Take 1-2 tablets by mouth as needed for dizziness or nausea. 07/15/23   [provider]  metolazone (ZAROXOLYN) 2.5 MG tablet Take 2.5 mg by mouth daily as needed. 07/05/22   [provider]  Omega-3 Fatty Acids (FISH OIL) 1000 MG CAPS Take 1 capsule by mouth in the morning.    [provider]  pantoprazole  (PROTONIX ) 40 MG tablet Take 40 mg by mouth in the morning. 01/16/20   [provider]  tiZANidine (ZANAFLEX) 4 MG tablet Take 4 mg by mouth as needed for muscle spasms. 07/12/22   [provider]  torsemide  (DEMADEX ) 20 MG tablet 60mg  (3 tablets) in the morning and 20mg  (1 tablet) in the afternoon    [provider]    Physical  Exam: Vitals:   09/27/23 1240 09/27/23 1250 09/27/23 1315 09/27/23 1330  BP:   (!) 179/98 (!) 145/107  Pulse: 94  (!) 111 (!) 118  Resp: (!) 34 (!) 24 (!) 33 (!) 29  Temp:      TempSrc:      SpO2: 90% 95% 92% 91%  Weight:      Height:       Physical Exam Vitals and nursing note reviewed.  Constitutional:      General: She is awake. She is not in acute distress.    Appearance: She is well-developed. She is ill-appearing.     Interventions: Nasal cannula in place.  HENT:     Head: Normocephalic.     Nose: No rhinorrhea.     Mouth/Throat:     Mouth: Mucous membranes are dry.  Eyes:     General: No scleral icterus.    Pupils: Pupils are equal, round, and reactive to light.  Neck:     Vascular: No JVD.  Cardiovascular:     Rate and Rhythm: Normal rate and regular rhythm.     Heart sounds: S1 normal and S2 normal.  Pulmonary:     Effort: Tachypnea present. No accessory muscle usage.     Breath sounds: Decreased breath sounds, wheezing, rhonchi and rales present.  Abdominal:     General: Bowel sounds are normal. There is no distension.     Palpations: Abdomen is soft.     Tenderness: There is no abdominal tenderness. There is no right CVA tenderness or left CVA tenderness.  Musculoskeletal:     Cervical back: Neck supple.     Right lower leg: Edema present.     Left lower leg: Edema present.  Skin:    General: Skin is warm and dry.  Neurological:     General: No focal deficit present.     Mental Status: She is alert and oriented to person, place, and time.  Psychiatric:        Mood and Affect: Mood normal.        Behavior: Behavior normal. Behavior is cooperative.     Data Reviewed:  Results are pending, will review when available.  07/18/2023 echocardiogram report. IMPRESSIONS:   1. Left ventricular ejection fraction, by estimation, is 60 to 65%. Left  ventricular ejection fraction by PLAX is 61 %. The left ventricle has  normal function. The left ventricle has  no regional  wall motion  abnormalities. Left ventricular diastolic  parameters are indeterminate. Elevated left ventricular end-diastolic  pressure.   2. Right ventricular systolic function is low normal. The right  ventricular size is normal. There is normal pulmonary artery systolic  pressure. The estimated right ventricular systolic pressure is 28.5 mmHg.   3. Left atrial size was moderately dilated.   4. The mitral valve is grossly normal. Mild mitral valve regurgitation.   5. The aortic valve is tricuspid. Aortic valve regurgitation is not  visualized.   6. Aortic dilatation noted. There is mild dilatation of the ascending  aorta, measuring 41 mm.   7. The inferior vena cava is dilated in size with <50% respiratory  variability, suggesting right atrial pressure of 15 mmHg.   EKG: Vent. rate 119 BPM PR interval * ms QRS duration 92 ms QT/QTcB 366/504 ms P-R-T axes * 22 31 Atrial fibrillation Ventricular premature complex Anterior infarct, old Minimal ST depression, lateral leads  Assessment and Plan: Principal Problem:   Acute respiratory failure with hypoxia (HCC) In the setting of:   CAP (community acquired pneumonia) With superimposed:   COPD with acute exacerbation (HCC) Admit to SDU/inpatient. Continue supplemental oxygen . Scheduled and as needed bronchodilators. Continue ceftriaxone  1 g IVPB daily. Continue azithromycin 500 mg IVPB daily. Check strep pneumoniae urinary antigen. Check sputum Gram stain, culture and sensitivity. Follow-up blood culture and sensitivity. Follow-up CBC and chemistry in the morning.  Active Problems:   Acute renal failure superimposed on stage 4 chronic kidney disease (HCC) In the setting of:   Acute on chronic diastolic congestive heart failure (HCC)  Seems to be volume overloaded. Continue supplemental oxygen  as needed. Sodium and fluid restriction. Received furosemide  earlier. Will recheck renal function before  resuming. Monitor daily weights, intake and output. Monitor renal function electrolytes.    Essential hypertension We will hold amlodipine  due to tachycardia. Continue bisoprolol  5 mg p.o. daily.    HLD (hyperlipidemia) Continue atorvastatin  80 mg p.o. bedtime.    Persistent atrial fibrillation CHA?DS?-VASc Score of 7. Continue apixaban  5 mg p.o. twice daily. Continue bisoprolol  for rate control.    Carotid artery disease (HCC)   PAD (peripheral artery disease) (HCC) On anticoagulation and statin. Follow-up with vascular as an outpatient. Smoking cessation advised.    Type II diabetes mellitus with renal manifestations (HCC) Carbohydrate modified diet. CBG monitoring with RI SS. Check hemoglobin A1c.    GERD (gastroesophageal reflux disease) Antiacid, H2 blocker or PPI as needed.    Tobacco abuse Tobacco cessation advised. Nicotine  replacement therapy ordered.    Mild protein malnutrition (HCC) In the setting of anemia and malignancy. May benefit from protein supplementation. Consider nutritional services evaluation. Follow-up albumin level.     Advance Care Planning:   Code Status: Full Code   Consults:   Family Communication:   Severity of Illness: The appropriate patient status for this patient is INPATIENT. Inpatient status is judged to be reasonable and necessary in order to provide the required intensity of service to ensure the patient's safety. The patient's presenting symptoms, physical exam findings, and initial radiographic and laboratory data in the context of their chronic comorbidities is felt to place them at high risk for further clinical deterioration. Furthermore, it is not anticipated that the patient will be medically stable for discharge from the hospital within 2 midnights of admission.   * I certify that at the point of admission it is my clinical judgment that the patient will require inpatient hospital care spanning beyond 2  midnights from  the point of admission due to high intensity of service, high risk for further deterioration and high frequency of surveillance required.*  Author: Danice Dural, MD 09/27/2023 2:10 PM  For on call review [email or web address].   This document was prepared using Dragon voice recognition software and may contain some unintended transcription errors.

## 2023-09-27 NOTE — ED Notes (Addendum)
 Oxisensor moved. In and out of afib, neb complete, RT notified, d/w EDP. Family at [facility]. "Feeling better". Placed on O2 [place] 2L.

## 2023-09-27 NOTE — ED Provider Notes (Addendum)
 [facility] Provider Note   CSN: [id number] Arrival date & time: 09/27/23  9157     History  Chief Complaint  Patient presents with   Shortness of Breath    Paula Wheeler is a 73 y.o. female.  73 yo F with a chief complaint of difficulty breathing.  This been going on for a few days now.  She has had some indigestion with it as well.  She denies cough congestion or fever.  She was wondering if this was due to her lung disease.  She has chronic lower extremity edema that she does not think is significantly different.   Shortness of Breath      Home Medications Prior to Admission medications   Medication Sig Start Date End Date Taking? Authorizing Provider  acetaminophen  (TYLENOL ) 500 MG tablet Take 500 mg by mouth every 6 (six) hours as needed for mild pain (pain score 1-3) or moderate pain (pain score 4-6).    [provider]  albuterol  (PROVENTIL  HFA;VENTOLIN  HFA) 108 (90 Base) MCG/ACT inhaler Inhale 2 puffs into the lungs every 4 (four) hours as needed for wheezing or shortness of breath.    [provider]  allopurinol  (ZYLOPRIM ) 100 MG tablet Take 100 mg by mouth in the morning.    [provider]  amLODipine  (NORVASC ) 5 MG tablet Take 5 mg by mouth every morning. 04/26/20   [provider]  apixaban  (ELIQUIS ) 5 MG TABS tablet Take 1 tablet (5 mg total) by mouth 2 (two) times daily. 08/20/23   Nahser, Aleene PARAS, MD  atorvastatin  (LIPITOR) 80 MG tablet Take 1 tablet (80 mg total) by mouth at bedtime. 08/20/23 11/18/23  Nahser, Aleene PARAS, MD  bisoprolol  (ZEBETA ) 5 MG tablet TAKE 1 TABLET(5 MG) BY MOUTH DAILY Patient taking differently: Take 5 mg by mouth in the morning. 07/30/22   Nahser, Aleene PARAS, MD  Cyanocobalamin (B-12 PO) Take 1 tablet by mouth in the morning.    [provider]  escitalopram  (LEXAPRO ) 10 MG tablet Take 10 mg by mouth as needed.    [provider]  ezetimibe   (ZETIA ) 10 MG tablet Take 1 tablet (10 mg total) by mouth daily. Patient taking differently: Take 10 mg by mouth in the morning. 07/29/22   Nahser, Aleene PARAS, MD  ferrous sulfate  325 (65 FE) MG tablet Take 1 tablet (325 mg total) by mouth daily with breakfast. 11/30/19   Regalado, Owen A, MD  Fluticasone -Umeclidin-Vilant (TRELEGY ELLIPTA ) 100-62.5-25 MCG/ACT AEPB INHALE 1 PUFF INTO THE LUNGS DAILY Patient taking differently: Inhale 1 puff into the lungs in the morning. 01/23/23   Kara Dorn NOVAK, MD  folic acid  (FOLVITE ) 1 MG tablet Take 1 tablet (1 mg total) by mouth daily. Patient taking differently: Take 1 mg by mouth in the morning. 11/30/19   Regalado, Belkys A, MD  HYDROcodone -acetaminophen  (NORCO/VICODIN) 5-325 MG tablet Take 1 tablet by mouth every 6 (six) hours as needed for moderate pain.    [provider]  insulin  NPH-regular Human (70-30) 100 UNIT/ML injection Inject 6 Units into the skin 2 (two) times daily with a meal. Inject 30 units into the skin in the morning and 70 units into the skin in the evening Patient taking differently: Inject 30-60 Units into the skin 2 (two) times daily with a meal. Inject 30 units into the skin in the morning and 60 units into the skin in the evening 11/29/19   Regalado, Owen DELENA, MD  Insulin  Syringe-Needle U-100 31G X 5/16 1 ML MISC by Does not apply route.    [provider]  loratadine  (CLARITIN ) 10 MG tablet Take 10 mg by mouth in the morning.    [provider]  meclizine (ANTIVERT) 12.5 MG tablet Take 1-2 tablets by mouth as needed for dizziness or nausea. 07/15/23   [provider]  metolazone (ZAROXOLYN) 2.5 MG tablet Take 2.5 mg by mouth daily as needed. 07/05/22   [provider]  Omega-3 Fatty Acids (FISH OIL) 1000 MG CAPS Take 1 capsule by mouth in the morning.    [provider]  pantoprazole  (PROTONIX ) 40 MG tablet Take 40 mg by mouth in the morning. 01/16/20   [provider]   tiZANidine (ZANAFLEX) 4 MG tablet Take 4 mg by mouth as needed for muscle spasms. 07/12/22   [provider]  torsemide  (DEMADEX ) 20 MG tablet 60mg  (3 tablets) in the morning and 20mg  (1 tablet) in the afternoon    [provider]      Allergies    Nsaids, Penicillins, and Quinapril  hcl    Review of Systems   Review of Systems  Respiratory:  Positive for shortness of breath.     Physical Exam Updated Vital Signs BP (!) 169/90   Pulse 94   Temp 98.4 F (36.9 C) (Oral)   Resp (!) 24   Ht 5' 3 (1.6 m)   Wt 86.2 kg   SpO2 95%   BMI 33.66 kg/m  Physical Exam Vitals and nursing note reviewed.  Constitutional:      General: She is not in acute distress.    Appearance: She is well-developed. She is not diaphoretic.  HENT:     Head: Normocephalic and atraumatic.   Eyes:     Pupils: Pupils are equal, round, and reactive to light.    Cardiovascular:     Rate and Rhythm: Normal rate and regular rhythm.     Heart sounds: No murmur heard.    No friction rub. No gallop.  Pulmonary:     Effort: Pulmonary effort is normal.     Breath sounds: No wheezing or rales.  Abdominal:     General: There is no distension.     Palpations: Abdomen is soft.     Tenderness: There is no abdominal tenderness.   Musculoskeletal:        General: No tenderness.     Cervical back: Normal range of motion and neck supple.   Skin:    General: Skin is warm and dry.   Neurological:     Mental Status: She is alert and oriented to person, place, and time.   Psychiatric:        Behavior: Behavior normal.     ED Results / Procedures / Treatments   Labs (all labs ordered are listed, but only abnormal results are displayed) Labs Reviewed  BASIC METABOLIC PANEL WITH GFR - Abnormal; Notable for the following components:      Result Value   Glucose, Bld 336 (*)    BUN 44 (*)    Creatinine, Ser 2.18 (*)    GFR, Estimated 23 (*)    All other components within normal limits   CBC - Abnormal; Notable for the following components:   WBC 15.2 (*)    RBC 3.21 (*)    Hemoglobin 10.2 (*)    HCT 32.5 (*)    MCV 101.2 (*)    RDW 16.7 (*)    All other  components within normal limits  BRAIN NATRIURETIC PEPTIDE - Abnormal; Notable for the following components:   B Natriuretic Peptide 194.6 (*)    All other components within normal limits  TROPONIN I (HIGH SENSITIVITY) - Abnormal; Notable for the following components:   Troponin I (High Sensitivity) 81 (*)    All other components within normal limits  CULTURE, BLOOD (ROUTINE X 2)  CULTURE, BLOOD (ROUTINE X 2)  HEPATIC FUNCTION PANEL  LIPASE, BLOOD  MAGNESIUM     EKG EKG Interpretation Date/Time:  Saturday Sep 27 2023 08:50:06 EDT Ventricular Rate:  119 PR Interval:    QRS Duration:  92 QT Interval:  366 QTC Calculation: 504 R Axis:   22  Text Interpretation: Atrial fibrillation Ventricular premature complex Anterior infarct, old Minimal ST depression, lateral leads Otherwise no significant change Confirmed by Emil Share [phone]) on 09/27/2023 8:57:01 AM  Radiology CT CHEST ABDOMEN PELVIS WO CONTRAST Result Date: 09/27/2023 CLINICAL DATA:  Sepsis. EXAM: CT CHEST, ABDOMEN AND PELVIS WITHOUT CONTRAST TECHNIQUE: Multidetector CT imaging of the chest, abdomen and pelvis was performed following the standard protocol without IV contrast. RADIATION DOSE REDUCTION: This exam was performed according to the departmental dose-optimization program which includes automated exposure control, adjustment of the mA and/or kV according to patient size and/or use of iterative reconstruction technique. COMPARISON:  CTA chest abdomen and pelvis 11/23/2019 FINDINGS: CT CHEST FINDINGS Cardiovascular: The heart is enlarged. Small pericardial effusion. Coronary artery calcification is evident. Mild atherosclerotic calcification is noted in the wall of the thoracic aorta. Enlargement of the pulmonary outflow tract/main pulmonary arteries  suggests pulmonary arterial hypertension. Mediastinum/Nodes: 2 cm left thyroid nodule evident. There is also a 2 cm nodule in the right thyroid lobe. 17 mm short axis precarinal lymph node is progressive in the interval. 15 mm short axis AP window lymph node is progressive. Hilar regions not well evaluated on noncontrast imaging. The esophagus has normal imaging features. There is no axillary lymphadenopathy. Lungs/Pleura: Collapse/consolidation seen in the right middle lobe with mild collapse/consolidation in the lingula. There is dependent collapse/consolidation in both lower lobes, right greater than left. Patchy ground-glass attenuation noted bilaterally. 6 mm left lower lobe nodule identified on 67/6. Small bilateral pleural effusions. Musculoskeletal: No worrisome lytic or sclerotic osseous abnormality. CT ABDOMEN PELVIS FINDINGS Hepatobiliary: No suspicious focal abnormality in the liver on this study without intravenous contrast. Gallbladder is distended with pericholecystic edema/fluid. No definite calcified gallstones. No intrahepatic or extrahepatic biliary dilation. Pancreas: No focal mass lesion. No dilatation of the main duct. No intraparenchymal cyst. No peripancreatic edema. Spleen: No splenomegaly. No suspicious focal mass lesion. Adrenals/Urinary Tract: Adrenal thickening without a discrete nodule or mass. Right kidney unremarkable. 16 mm exophytic water  density lesion lower pole left kidney is compatible with a cyst. No evidence for hydroureter. The urinary bladder appears normal for the degree of distention. Stomach/Bowel: Stomach is unremarkable. No gastric wall thickening. No evidence of outlet obstruction. Duodenum is normally positioned as is the ligament of Treitz. No small bowel wall thickening. No small bowel dilatation. The terminal ileum is normal. The appendix is not well visualized, but there is no edema or inflammation in the region of the cecal tip to suggest appendicitis. No gross  colonic mass. No colonic wall thickening. Vascular/Lymphatic: There is advanced atherosclerotic calcification of the abdominal aorta without aneurysm. There is no gastrohepatic or hepatoduodenal ligament lymphadenopathy. No retroperitoneal or mesenteric lymphadenopathy. No pelvic sidewall lymphadenopathy. Reproductive: The uterus is unremarkable.  There is no adnexal mass. Other: Trace free  fluid is seen in the pelvis. Small volume free fluid is seen around the liver. Musculoskeletal: No worrisome lytic or sclerotic osseous abnormality. Diffuse degenerative disc disease noted lumbar spine. IMPRESSION: 1. Collapse/consolidation in the right middle lobe with mild collapse/consolidation in the lingula. Dependent collapse/consolidation in both lower lobes, right greater than left. Imaging features compatible with multifocal pneumonia. 2. Small bilateral pleural effusions. 3. Patchy ground-glass attenuation bilaterally, likely infectious/inflammatory. 4. 6 mm left lower lobe pulmonary nodule. Non-contrast chest CT at 6-12 months is recommended. If the nodule is stable at time of repeat CT, then future CT at 18-24 months (from today's scan) is considered optional for low-risk patients, but is recommended for high-risk patients. This recommendation follows the consensus statement: Guidelines for Management of Incidental Pulmonary Nodules Detected on CT Images: From the Fleischner Society 2017; Radiology 2017; 284:228-243. 5. Progressive mediastinal lymphadenopathy. This is likely reactive but close follow-up warranted. Repeat CT chest in 3 months recommended. 6. Distended gallbladder with pericholecystic edema/fluid. No definite calcified gallstones. Right upper quadrant ultrasound recommended to further evaluate. 7. Small volume free fluid around the liver and in the pelvis. 8. Bilateral thyroid nodules measuring up to 2 cm. Recommend thyroid US  (ref: J Am Coll Radiol. 2015 Feb;12(2): 143-50). 9. Enlargement of the  pulmonary outflow tract/main pulmonary arteries suggests pulmonary arterial hypertension. 10.  Aortic Atherosclerosis (ICD10-I70.0). Electronically Signed   By: Camellia Candle M.D.   On: 09/27/2023 12:02   DG Chest 2 View Result Date: 09/27/2023 CLINICAL DATA:  Shortness of breath for 2 days.  COPD. EXAM: CHEST - 2 VIEW COMPARISON:  12/03/2019 FINDINGS: Marked cardiac enlargement and diffuse pulmonary vascular congestion are seen. Right middle lobe opacity is new since previous study and suspicious for pneumonia. Tiny right pleural effusion also noted IMPRESSION: New right middle lobe opacity and tiny right pleural effusion, suspicious for pneumonia. Recommend continued chest radiographic follow-up to confirm resolution. Marked cardiac enlargement and diffuse pulmonary vascular congestion. Electronically Signed   By: Norleen DELENA Kil M.D.   On: 09/27/2023 09:31    Procedures .Critical Care  Performed by: Emil Share, DO Authorized by: Emil Share, DO   Critical care provider statement:    Critical care time (minutes):  35   Critical care time was exclusive of:  Separately billable procedures and treating other patients   Critical care was time spent personally by me on the following activities:  Development of treatment plan with patient or surrogate, discussions with consultants, evaluation of patient's response to treatment, examination of patient, ordering and review of laboratory studies, ordering and review of radiographic studies, ordering and performing treatments and interventions, pulse oximetry, re-evaluation of patient's condition and review of old charts   Care discussed with: admitting provider       Medications Ordered in ED Medications  furosemide  (LASIX ) injection 40 mg (has no administration in time range)  cefTRIAXone  (ROCEPHIN ) 2 g in sodium chloride  0.9 % 100 mL IVPB (has no administration in time range)  azithromycin  (ZITHROMAX ) 500 mg in sodium chloride  0.9 % 250 mL IVPB (has no  administration in time range)  ipratropium-albuterol  (DUONEB) 0.5-2.5 (3) MG/3ML nebulizer solution 3 mL (3 mLs Nebulization Given 09/27/23 0948)  ipratropium-albuterol  (DUONEB) 0.5-2.5 (3) MG/3ML nebulizer solution 3 mL (3 mLs Nebulization Given 09/27/23 1125)    ED Course/ Medical Decision Making/ A&P  Medical Decision Making Amount and/or Complexity of Data Reviewed Labs: ordered. Radiology: ordered.  Risk Prescription drug management. Decision regarding hospitalization.   73 yo F with a chief complaint of difficulty breathing.  Going on for a few days now.  Patient is hypoxic and not normally on oxygen  at home.  Patient is a mild bump in her troponin.  BNP is in an indeterminate range.  Chest x-ray independently interpreted by me with concern for right-sided infiltrate.  CT imaging consistent with the same.  Read as multifocal pneumonia.  Patient's breath likely is multifactorial.  Was not certain about pneumonia though with multifocal pneumonia on CT will start on antibiotics now.  She does have some pleural effusions.  Given a bolus of Lasix .  Patient initially pretty adamant that she was going to go home.  She ambulated with a walker which is her baseline but was significantly hypoxic with an O2 sat in the low 80s persistently.  Quite out of breath on repeat assessment.  She reluctantly agrees to come into the hospital. Will discuss with medicine.   The patients results and plan were reviewed and discussed.   Any x-rays performed were independently reviewed by myself.   Differential diagnosis were considered with the presenting HPI.  Medications  furosemide  (LASIX ) injection 40 mg (has no administration in time range)  cefTRIAXone  (ROCEPHIN ) 2 g in sodium chloride  0.9 % 100 mL IVPB (has no administration in time range)  azithromycin  (ZITHROMAX ) 500 mg in sodium chloride  0.9 % 250 mL IVPB (has no administration in time range)   ipratropium-albuterol  (DUONEB) 0.5-2.5 (3) MG/3ML nebulizer solution 3 mL (3 mLs Nebulization Given 09/27/23 0948)  ipratropium-albuterol  (DUONEB) 0.5-2.5 (3) MG/3ML nebulizer solution 3 mL (3 mLs Nebulization Given 09/27/23 1125)    Vitals:   09/27/23 1230 09/27/23 1235 09/27/23 1240 09/27/23 1250  BP: (!) 169/90     Pulse: (!) 114 (!) 103 94   Resp: (!) 34 (!) 39 (!) 34 (!) 24  Temp:      TempSrc:      SpO2: 91% 90% 90% 95%  Weight:      Height:        Final diagnoses:  Acute on chronic respiratory failure with hypoxia (HCC)  Multifocal pneumonia    Admission/ observation were discussed with the admitting physician, patient and/or family and they are comfortable with the plan.          Final Clinical Impression(s) / ED Diagnoses Final diagnoses:  Acute on chronic respiratory failure with hypoxia [facility])  Multifocal pneumonia    Rx / DC Orders ED Discharge Orders     None         Emil Share, DO 09/27/23 1303    Emil Share, DO 10/25/23 [id number]

## 2023-09-28 DIAGNOSIS — J9601 Acute respiratory failure with hypoxia: Secondary | ICD-10-CM | POA: Diagnosis not present

## 2023-09-28 LAB — GLUCOSE, CAPILLARY
Glucose-Capillary: 246 mg/dL — ABNORMAL HIGH (ref 70–99)
Glucose-Capillary: 201 mg/dL — ABNORMAL HIGH (ref 70–99)
Glucose-Capillary: 250 mg/dL — ABNORMAL HIGH (ref 70–99)
Glucose-Capillary: 154 mg/dL — ABNORMAL HIGH (ref 70–99)

## 2023-09-28 LAB — CBC
HCT: 29.4 % — ABNORMAL LOW (ref 36.0–46.0)
Hemoglobin: 9.3 g/dL — ABNORMAL LOW (ref 12.0–15.0)
MCH: 31.6 pg (ref 26.0–34.0)
MCHC: 31.6 g/dL (ref 30.0–36.0)
MCV: 100 fL (ref 80.0–100.0)
Platelets: 194 10*3/uL (ref 150–400)
RBC: 2.94 MIL/uL — ABNORMAL LOW (ref 3.87–5.11)
RDW: 16.4 % — ABNORMAL HIGH (ref 11.5–15.5)
WBC: 14.7 10*3/uL — ABNORMAL HIGH (ref 4.0–10.5)
nRBC: 0 % (ref 0.0–0.2)

## 2023-09-28 LAB — COMPREHENSIVE METABOLIC PANEL WITH GFR
ALT: 12 U/L (ref 0–44)
AST: 11 U/L — ABNORMAL LOW (ref 15–41)
Albumin: 3 g/dL — ABNORMAL LOW (ref 3.5–5.0)
Alkaline Phosphatase: 76 U/L (ref 38–126)
Anion gap: 13 (ref 5–15)
BUN: 42 mg/dL — ABNORMAL HIGH (ref 8–23)
CO2: 23 mmol/L (ref 22–32)
Calcium: 9.5 mg/dL (ref 8.9–10.3)
Chloride: 100 mmol/L (ref 98–111)
Creatinine, Ser: 2.01 mg/dL — ABNORMAL HIGH (ref 0.44–1.00)
GFR, Estimated: 26 mL/min — ABNORMAL LOW (ref >60)
Glucose, Bld: 254 mg/dL — ABNORMAL HIGH (ref 70–99)
Potassium: 3.3 mmol/L — ABNORMAL LOW (ref 3.5–5.1)
Sodium: 136 mmol/L (ref 135–145)
Total Bilirubin: 1.6 mg/dL — ABNORMAL HIGH (ref 0.0–1.2)
Total Protein: 6 g/dL — ABNORMAL LOW (ref 6.5–8.1)

## 2023-09-28 LAB — PROCALCITONIN: Procalcitonin: 0.35 ng/mL

## 2023-09-28 MED ORDER — FUROSEMIDE 10 MG/ML IJ SOLN
60.0000 mg | Freq: Once | INTRAMUSCULAR | Status: AC
  Administered 2023-09-28: 60 mg via INTRAVENOUS
  Filled 2023-09-28: qty 6

## 2023-09-28 MED ORDER — POTASSIUM CHLORIDE CRYS ER 20 MEQ PO TBCR
40.0000 meq | EXTENDED_RELEASE_TABLET | Freq: Once | ORAL | Status: AC
  Administered 2023-09-28: 40 meq via ORAL
  Filled 2023-09-28: qty 2

## 2023-09-28 NOTE — Plan of Care (Signed)

## 2023-09-28 NOTE — Progress Notes (Signed)
 PROGRESS NOTE    Paula Wheeler  WJX:[id number] DOB: October 29, 1950 DOA: 09/27/2023 PCP: Jimmey Mould, MD   Brief Narrative:  Paula Wheeler is a 73 y.o. female with medical history significant of anemia, active cigarette smoking, COPD on room air, hyperlipidemia, exogenous obesity, GERD, prior GI bleed, hypertension, iron  deficiency anemia, longstanding persistent atrial fibrillation on Eliquis , microalbuminuria, tobacco dependence, type 2 diabetes.  Patient presented to the emergency department with complaints of progressively worse dyspnea associated with wheezing and yellowish sputum producing cough for 2 to 3 days.  Hospitalist called for admission given acute hypoxia and concern for infection.   Assessment & Plan:   Principal Problem:   Acute respiratory failure with hypoxia (HCC) Active Problems:   Essential hypertension   HLD (hyperlipidemia)   Persistent atrial fibrillation   PAD (peripheral artery disease) (HCC)   Acute renal failure superimposed on stage 4 chronic kidney disease (HCC)   Type II diabetes mellitus with renal manifestations (HCC)   GERD (gastroesophageal reflux disease)   Tobacco abuse   Carotid artery disease (HCC)   COPD with acute exacerbation (HCC)   Mild protein malnutrition (HCC)   CAP (community acquired pneumonia)   Acute on chronic diastolic congestive heart failure (HCC)  Acute respiratory failure with acute hypoxia (HCC) CAP (community acquired pneumonia) COPD with acute exacerbation (HCC) - Continue broad-spectrum antibiotics, nebs -hold steroids in the interim, no notable wheeze - Follow cultures - Continue to wean oxygen , baseline patient is on room air currently weaned to room air at rest, ambulatory oxygen  screen pending  Acute renal failure superimposed on stage 4 chronic kidney disease (HCC) Acute on chronic diastolic congestive heart failure (HCC)  -Likely secondary to volume overload, repeat furosemide  x 1 again today given  poor urine output - Follow potassium and creatinine - Avoid unnecessary IV fluids, continue sodium and fluid restriction  Essential hypertension Continue bisoprolol  5 mg p.o. daily. Amlodipine  on hold   HLD (hyperlipidemia) Continue atorvastatin  80 mg p.o. bedtime.   Persistent atrial fibrillation CHA?DS?-VASc Score of 7  Continue apixaban  5 mg p.o. twice daily. Continue bisoprolol  for rate control. Tachycardia secondary to ongoing nebs   Carotid artery disease (HCC) PAD (peripheral artery disease) (HCC) On anticoagulation and statin. Follow-up with vascular as an outpatient. Smoking cessation advised.   Type II diabetes mellitus with renal manifestations (HCC), uncontrolled with hyperglycemia Carbohydrate modified diet. CBG monitoring with RI SS - will discharge on home 70-30 with close outpatient follow upw A1C >15.5 *undetectably high*   GERD (gastroesophageal reflux disease) Antiacid, H2 blocker or PPI as needed.   Tobacco abuse Tobacco cessation advised. Nicotine  replacement therapy ordered.   Mild protein malnutrition (HCC) In the setting of anemia and malignancy. May benefit from protein supplementation. Consider nutritional services evaluation. Follow-up albumin level.   DVT prophylaxis: apixaban  (ELIQUIS ) tablet 5 mg Start: 09/27/23 2200 apixaban  (ELIQUIS ) tablet 5 mg  Code Status:   Code Status: Full Code Family Communication: None present  Status is: Inpatient  Dispo: The patient is from: Home              Anticipated d/c is to: Home              Anticipated d/c date is: 24 to 48 hours              Patient currently not medically stable for discharge  Consultants:  None  Procedures:  None  Antimicrobials:  Azithromycin ceftriaxone   Subjective: No acute issues or events overnight denies  nausea vomiting diarrhea constipation any fevers chills or chest pain.  Shortness of breath markedly improved but not yet back to  baseline.  Objective: Vitals:   09/28/23 0422 09/28/23 0500 09/28/23 0600 09/28/23 0700  BP:  (!) 159/95 (!) 143/63 (!) 143/55  Pulse:  99 100 94  Resp:  (!) 29 (!) 30 (!) 25  Temp: 98.7 F (37.1 C)     TempSrc: Oral     SpO2:  94% 94% 94%  Weight:      Height:        Intake/Output Summary (Last 24 hours) at 09/28/2023 0720 Last data filed at 09/27/2023 2156 Gross per 24 hour  Intake 350 ml  Output 300 ml  Net 50 ml   Filed Weights   09/27/23 0852 09/27/23 1513 09/28/23 0315  Weight: 86.2 kg 85.8 kg 84.2 kg    Examination:  General:  Pleasantly resting in bed, No acute distress. HEENT:  Normocephalic atraumatic.  Sclerae nonicteric, noninjected.  Extraocular movements intact bilaterally. Neck:  Without mass or deformity.  Trachea is midline. Lungs:   Diminished bilaterally without overt wheeze, or rales. Heart:  Regular rate and rhythm.  Without murmurs, rubs, or gallops. Abdomen:  Soft, nontender, nondistended.  Without guarding or rebound. Extremities: Without cyanosis, clubbing, edema, or obvious deformity. Skin:  Warm and dry, no erythema.  Data Reviewed: I have personally reviewed following labs and imaging studies  CBC: Recent Labs  Lab 09/27/23 0905 09/28/23 0256  WBC 15.2* 14.7*  HGB 10.2* 9.3*  HCT 32.5* 29.4*  MCV 101.2* 100.0  PLT 240 194   Basic Metabolic Panel: Recent Labs  Lab 09/27/23 0905 09/27/23 1609 09/28/23 0256  NA 136 138 136  K 3.9 4.1 3.3*  CL 98 99 100  CO2 24 25 23   GLUCOSE 336* 260* 254*  BUN 44* 44* 42*  CREATININE 2.18* 1.84* 2.01*  CALCIUM  9.9 9.9 9.5  MG 1.9 2.0  --   PHOS  --  3.1  --    GFR: Estimated Creatinine Clearance: 26.7 mL/min (A) (by C-G formula based on SCr of 2.01 mg/dL (H)). Liver Function Tests: Recent Labs  Lab 09/27/23 0905 09/28/23 0256  AST 23 11*  ALT 13 12  ALKPHOS 83 76  BILITOT 1.7* 1.6*  PROT 7.0 6.0*  ALBUMIN 3.4* 3.0*   Recent Labs  Lab 09/27/23 0905  LIPASE 43   CBG: Recent  Labs  Lab 09/27/23 1542 09/27/23 2116  GLUCAP 298* 259*   Sepsis Labs: Recent Labs  Lab 09/27/23 1609 09/28/23 0256  PROCALCITON 0.32 0.35    Recent Results (from the past 240 hours)  MRSA Next Gen by PCR, Nasal     Status: None   Collection Time: 09/27/23  3:00 PM   Specimen: Nasal Mucosa; Nasal Swab  Result Value Ref Range Status   MRSA by PCR Next Gen NOT DETECTED NOT DETECTED Final    Comment: (NOTE) The GeneXpert MRSA Assay (FDA approved for NASAL specimens only), is one component of a comprehensive MRSA colonization surveillance program. It is not intended to diagnose MRSA infection nor to guide or monitor treatment for MRSA infections. Test performance is not FDA approved in patients less than 28 years old. Performed at [facility], [address], [place], [place] [address]          Radiology Studies: CT CHEST ABDOMEN PELVIS WO CONTRAST Result Date: 09/27/2023 CLINICAL DATA:  Sepsis. EXAM: CT CHEST, ABDOMEN AND PELVIS WITHOUT CONTRAST TECHNIQUE: Multidetector CT imaging  of the chest, abdomen and pelvis was performed following the standard protocol without IV contrast. RADIATION DOSE REDUCTION: This exam was performed according to the departmental dose-optimization program which includes automated exposure control, adjustment of the mA and/or kV according to patient size and/or use of iterative reconstruction technique. COMPARISON:  CTA chest abdomen and pelvis 11/23/2019 FINDINGS: CT CHEST FINDINGS Cardiovascular: The heart is enlarged. Small pericardial effusion. Coronary artery calcification is evident. Mild atherosclerotic calcification is noted in the wall of the thoracic aorta. Enlargement of the pulmonary outflow tract/main pulmonary arteries suggests pulmonary arterial hypertension. Mediastinum/Nodes: 2 cm left thyroid nodule evident. There is also a 2 cm nodule in the right thyroid lobe. 17 mm short axis precarinal lymph node is progressive in  the interval. 15 mm short axis AP window lymph node is progressive. Hilar regions not well evaluated on noncontrast imaging. The esophagus has normal imaging features. There is no axillary lymphadenopathy. Lungs/Pleura: Collapse/consolidation seen in the right middle lobe with mild collapse/consolidation in the lingula. There is dependent collapse/consolidation in both lower lobes, right greater than left. Patchy ground-glass attenuation noted bilaterally. 6 mm left lower lobe nodule identified on 67/6. Small bilateral pleural effusions. Musculoskeletal: No worrisome lytic or sclerotic osseous abnormality. CT ABDOMEN PELVIS FINDINGS Hepatobiliary: No suspicious focal abnormality in the liver on this study without intravenous contrast. Gallbladder is distended with pericholecystic edema/fluid. No definite calcified gallstones. No intrahepatic or extrahepatic biliary dilation. Pancreas: No focal mass lesion. No dilatation of the main duct. No intraparenchymal cyst. No peripancreatic edema. Spleen: No splenomegaly. No suspicious focal mass lesion. Adrenals/Urinary Tract: Adrenal thickening without a discrete nodule or mass. Right kidney unremarkable. 16 mm exophytic water  density lesion lower pole left kidney is compatible with a cyst. No evidence for hydroureter. The urinary bladder appears normal for the degree of distention. Stomach/Bowel: Stomach is unremarkable. No gastric wall thickening. No evidence of outlet obstruction. Duodenum is normally positioned as is the ligament of Treitz. No small bowel wall thickening. No small bowel dilatation. The terminal ileum is normal. The appendix is not well visualized, but there is no edema or inflammation in the region of the cecal tip to suggest appendicitis. No gross colonic mass. No colonic wall thickening. Vascular/Lymphatic: There is advanced atherosclerotic calcification of the abdominal aorta without aneurysm. There is no gastrohepatic or hepatoduodenal ligament  lymphadenopathy. No retroperitoneal or mesenteric lymphadenopathy. No pelvic sidewall lymphadenopathy. Reproductive: The uterus is unremarkable.  There is no adnexal mass. Other: Trace free fluid is seen in the pelvis. Small volume free fluid is seen around the liver. Musculoskeletal: No worrisome lytic or sclerotic osseous abnormality. Diffuse degenerative disc disease noted lumbar spine. IMPRESSION: 1. Collapse/consolidation in the right middle lobe with mild collapse/consolidation in the lingula. Dependent collapse/consolidation in both lower lobes, right greater than left. Imaging features compatible with multifocal pneumonia. 2. Small bilateral pleural effusions. 3. Patchy ground-glass attenuation bilaterally, likely infectious/inflammatory. 4. 6 mm left lower lobe pulmonary nodule. Non-contrast chest CT at 6-12 months is recommended. If the nodule is stable at time of repeat CT, then future CT at 18-24 months (from today's scan) is considered optional for low-risk patients, but is recommended for high-risk patients. This recommendation follows the consensus statement: Guidelines for Management of Incidental Pulmonary Nodules Detected on CT Images: From the Fleischner Society 2017; Radiology 2017; 284:228-243. 5. Progressive mediastinal lymphadenopathy. This is likely reactive but close follow-up warranted. Repeat CT chest in 3 months recommended. 6. Distended gallbladder with pericholecystic edema/fluid. No definite calcified gallstones. Right upper quadrant  ultrasound recommended to further evaluate. 7. Small volume free fluid around the liver and in the pelvis. 8. Bilateral thyroid nodules measuring up to 2 cm. Recommend thyroid US  (ref: J Am Coll Radiol. 2015 Feb;12(2): 143-50). 9. Enlargement of the pulmonary outflow tract/main pulmonary arteries suggests pulmonary arterial hypertension. 10.  Aortic Atherosclerosis (ICD10-I70.0). Electronically Signed   By: Donnal Fusi M.D.   On: 09/27/2023 12:02   DG  Chest 2 View Result Date: 09/27/2023 CLINICAL DATA:  Shortness of breath for 2 days.  COPD. EXAM: CHEST - 2 VIEW COMPARISON:  12/03/2019 FINDINGS: Marked cardiac enlargement and diffuse pulmonary vascular congestion are seen. Right middle lobe opacity is new since previous study and suspicious for pneumonia. Tiny right pleural effusion also noted IMPRESSION: New right middle lobe opacity and tiny right pleural effusion, suspicious for pneumonia. Recommend continued chest radiographic follow-up to confirm resolution. Marked cardiac enlargement and diffuse pulmonary vascular congestion. Electronically Signed   By: Marlyce Sine M.D.   On: 09/27/2023 09:31        Scheduled Meds:  allopurinol   100 mg Oral q AM   apixaban   5 mg Oral BID   atorvastatin   80 mg Oral QHS   bisoprolol   5 mg Oral q AM   budesonide-glycopyrrolate-formoterol  2 puff Inhalation BID   Chlorhexidine  Gluconate Cloth  6 each Topical Daily   ferrous sulfate   325 mg Oral Q breakfast   insulin  aspart  0-9 Units Subcutaneous TID WC   insulin  aspart protamine- aspart  15 Units Subcutaneous BID WC   levalbuterol   0.63 mg Nebulization Q6H   loratadine   10 mg Oral q AM   Continuous Infusions:  azithromycin     cefTRIAXone  (ROCEPHIN )  IV       LOS: 1 day   Time spent:  Haydee Lipa, DO [facility]  If 7PM-7AM, please contact night-coverage [email or web address]  09/28/2023, 7:20 AM

## 2023-09-28 NOTE — Plan of Care (Signed)
  Problem: Coping: Goal: Ability to adjust to condition or change in health will improve Outcome: Progressing   Problem: Fluid Volume: Goal: Ability to maintain a balanced intake and output will improve Outcome: Progressing   Problem: Health Behavior/Discharge Planning: Goal: Ability to identify and utilize available resources and services will improve Outcome: Progressing Goal: Ability to manage health-related needs will improve Outcome: Progressing   Problem: Nutritional: Goal: Maintenance of adequate nutrition will improve Outcome: Progressing   Problem: Tissue Perfusion: Goal: Adequacy of tissue perfusion will improve Outcome: Progressing

## 2023-09-29 DIAGNOSIS — J9601 Acute respiratory failure with hypoxia: Secondary | ICD-10-CM | POA: Diagnosis not present

## 2023-09-29 LAB — BASIC METABOLIC PANEL WITH GFR
Anion gap: 5 (ref 5–15)
BUN: 46 mg/dL — ABNORMAL HIGH (ref 8–23)
CO2: 26 mmol/L (ref 22–32)
Calcium: 8.7 mg/dL — ABNORMAL LOW (ref 8.9–10.3)
Chloride: 102 mmol/L (ref 98–111)
Creatinine, Ser: 2.16 mg/dL — ABNORMAL HIGH (ref 0.44–1.00)
GFR, Estimated: 24 mL/min — ABNORMAL LOW (ref >60)
Glucose, Bld: 134 mg/dL — ABNORMAL HIGH (ref 70–99)
Potassium: 3.9 mmol/L (ref 3.5–5.1)
Sodium: 133 mmol/L — ABNORMAL LOW (ref 135–145)

## 2023-09-29 LAB — GLUCOSE, CAPILLARY
Glucose-Capillary: 129 mg/dL — ABNORMAL HIGH (ref 70–99)
Glucose-Capillary: 121 mg/dL — ABNORMAL HIGH (ref 70–99)
Glucose-Capillary: 199 mg/dL — ABNORMAL HIGH (ref 70–99)
Glucose-Capillary: 177 mg/dL — ABNORMAL HIGH (ref 70–99)

## 2023-09-29 LAB — CBC
HCT: 31.3 % — ABNORMAL LOW (ref 36.0–46.0)
Hemoglobin: 9.5 g/dL — ABNORMAL LOW (ref 12.0–15.0)
MCH: 31 pg (ref 26.0–34.0)
MCHC: 30.4 g/dL (ref 30.0–36.0)
MCV: 102.3 fL — ABNORMAL HIGH (ref 80.0–100.0)
Platelets: 190 10*3/uL (ref 150–400)
RBC: 3.06 MIL/uL — ABNORMAL LOW (ref 3.87–5.11)
RDW: 16.1 % — ABNORMAL HIGH (ref 11.5–15.5)
WBC: 14 10*3/uL — ABNORMAL HIGH (ref 4.0–10.5)
nRBC: 0 % (ref 0.0–0.2)

## 2023-09-29 MED ORDER — LEVALBUTEROL HCL 0.63 MG/3ML IN NEBU
0.6300 mg | INHALATION_SOLUTION | Freq: Two times a day (BID) | RESPIRATORY_TRACT | Status: DC
  Administered 2023-09-29 – 2023-09-30 (×2): 0.63 mg via RESPIRATORY_TRACT
  Filled 2023-09-29 (×2): qty 3

## 2023-09-29 NOTE — Evaluation (Signed)
 Physical Therapy Evaluation Patient Details Name: Paula Wheeler MRN: [id number] DOB: 1950/12/14 Today's Date: 09/29/2023  History of Present Illness  Pt is 73 yo female admitted with acute resp failure due to CAP and COPD on 09/27/23.  Pt with hx including but not limited to anemia, active cigarette smoking, COPD on room air, hyperlipidemia, exogenous obesity, GERD, prior GI bleed, hypertension, iron  deficiency anemia, longstanding persistent atrial fibrillation on Eliquis , microalbuminuria, type 2 diabetes  Clinical Impression  Pt admitted with above diagnosis. At baseline, pt ambulatory with RW.  She lives alone and brother assist as needed.  Today, pt ambulated 65' with RW and CGA.  Demonstrated safe gait.  Pt was able to maintain O2 sats on RA (did have to relax hand to get good pleth).  Pt does have some mild instability and decreased endurance - recommend ongoing acute PT and HHPT.  Pt currently with functional limitations due to the deficits listed below (see PT Problem List). Pt will benefit from acute skilled PT to increase their independence and safety with mobility to allow discharge.       O2 Sats on RA Rest 97% O2 sats on RA ambulation 91%    If plan is discharge home, recommend the following: Assistance with cooking/housework;Help with stairs or ramp for entrance   Can travel by private vehicle        Equipment Recommendations None recommended by PT  Recommendations for Other Services       Functional Status Assessment Patient has had a recent decline in their functional status and demonstrates the ability to make significant improvements in function in a reasonable and predictable amount of time.     Precautions / Restrictions Precautions Precautions: Fall      Mobility  Bed Mobility               General bed mobility comments: in chair (sleesp in recliner at home)    Transfers Overall transfer level: Needs assistance Equipment used: Rolling walker (2  wheels) Transfers: Sit to/from Stand Sit to Stand: Supervision           General transfer comment: Required 2 attempts but no assist    Ambulation/Gait Ambulation/Gait assistance: Contact guard assist Gait Distance (Feet): 80 Feet Assistive device: Rolling walker (2 wheels) Gait Pattern/deviations: Step-to pattern, Decreased stride length, Trunk flexed Gait velocity: decreased but functional     General Gait Details: Required 1 standing rest break, improved fluidity of gait after ~20'  Stairs            Wheelchair Mobility     Tilt Bed    Modified Rankin (Stroke Patients Only)       Balance Overall balance assessment: Needs assistance Sitting-balance support: No upper extremity supported Sitting balance-Leahy Scale: Good     Standing balance support: Bilateral upper extremity supported, Reliant on assistive device for balance Standing balance-Leahy Scale: Poor Standing balance comment: steady with RW                             Pertinent Vitals/Pain Pain Assessment Pain Assessment: 0-10 Pain Location: R foot (chronic, like tooth ache) Pain Descriptors / Indicators: Aching Pain Intervention(s): Limited activity within patient's tolerance, Monitored during session, Repositioned    Home Living Family/patient expects to be discharged to:: Private residence Living Arrangements: Alone Available Help at Discharge: Family;Available PRN/intermittently Type of Home: House Home Access: Stairs to enter   Entrance Stairs-Number of Steps: 1 four inch  step   Home Layout: One level Home Equipment: Rolling Walker (2 wheels);Grab bars - toilet;Grab bars - tub/shower;Shower seat;Cane - single point Additional Comments: Sleeps in a recliner    Prior Function               Mobility Comments: Uses a RW ; can ambulate short community; does not drive; does reports episode of 5 falls but resports was on prednisone and glucose was not stable - no other  falls ADLs Comments: Works from home in [employer] ; able to do ADLs; has been doing [employer] ; brother assist with errands and IADLS     Extremity/Trunk Assessment   Upper Extremity Assessment Upper Extremity Assessment: Overall WFL for tasks assessed    Lower Extremity Assessment Lower Extremity Assessment: Overall WFL for tasks assessed;RLE deficits/detail RLE Deficits / Details: Pt with hx of R LE pain.  Reports like tooth ache around foot.  States was wearing compression hose in past until she developed wound on toe.  Bil LE wtih redness mid calf down - reports baseline.  Improved during activity and reports better  when elevated    Cervical / Trunk Assessment Cervical / Trunk Assessment: Kyphotic  Communication        Cognition Arousal: Alert Behavior During Therapy: WFL for tasks assessed/performed   PT - Cognitive impairments: No apparent impairments                                 Cueing       General Comments General comments (skin integrity, edema, etc.): Pt was on 1 L with sats 97%.  Tried RA and up to 98% on RA.  With ambulation initially reading 85% but poor pleth, once pt relaxed finger and pleth improved up to 91%.  Tested again after another 21' with pt's hand relaxed on therapist and immediately read 91% with good pleth.    Exercises     Assessment/Plan    PT Assessment Patient needs continued PT services  PT Problem List Decreased strength;Cardiopulmonary status limiting activity;Decreased activity tolerance;Decreased balance;Decreased mobility       PT Treatment Interventions Therapeutic exercise;DME instruction;Gait training;Balance training;Stair training;Functional mobility training;Modalities    PT Goals (Current goals can be found in the Care Plan section)  Acute Rehab PT Goals Patient Stated Goal: return home PT Goal Formulation: With patient Time For Goal Achievement: 10/13/23 Potential to Achieve Goals: Good     Frequency Min 2X/week     Co-evaluation               AM-PAC PT "6 Clicks" Mobility  Outcome Measure Help needed turning from your back to your side while in a flat bed without using bedrails?: A Little Help needed moving from lying on your back to sitting on the side of a flat bed without using bedrails?: A Little Help needed moving to and from a bed to a chair (including a wheelchair)?: A Little Help needed standing up from a chair using your arms (e.g., wheelchair or bedside chair)?: A Little Help needed to walk in hospital room?: A Little Help needed climbing 3-5 steps with a railing? : A Little 6 Click Score: 18    End of Session Equipment Utilized During Treatment: Gait belt Activity Tolerance: Patient tolerated treatment well Patient left: in chair;with call bell/phone within reach Nurse Communication: Mobility status PT Visit Diagnosis: Other abnormalities of gait and mobility (R26.89)    Time:  1610-9604 PT Time Calculation (min) (ACUTE ONLY): 25 min   Charges:   PT Evaluation $PT Eval Low Complexity: 1 Low PT Treatments $Gait Training: 8-22 mins PT General Charges $$ ACUTE PT VISIT: 1 Visit         Cyd Dowse, PT Acute Rehab [facility] [phone]   Carolynn Citrin 09/29/2023, 11:39 AM

## 2023-09-29 NOTE — Plan of Care (Signed)

## 2023-09-29 NOTE — Progress Notes (Signed)
SATURATION QUALIFICATIONS: (This note is used to comply with regulatory documentation for home oxygen)  Patient Saturations on Room Air at Rest =89 %  Patient Saturations on Room Air while Ambulating = 85%  Patient Saturations on 2 Liters of oxygen while Ambulating = 95%  Please briefly explain why patient needs home oxygen: 

## 2023-09-29 NOTE — Evaluation (Signed)
 Occupational Therapy Evaluation Patient Details Name: Paula Wheeler MRN: [id number] DOB: [birth year] Today's Date: 09/29/2023   History of Present Illness   Pt is 73 yo female admitted with acute resp failure due to CAP and COPD on 09/27/23.  Pt with hx including but not limited to anemia, active cigarette smoking, COPD on room air, hyperlipidemia, exogenous obesity, GERD, prior GI bleed, hypertension, iron  deficiency anemia, longstanding persistent atrial fibrillation on Eliquis , microalbuminuria, type 2 diabetes     Clinical Impressions PTA, patient was living alone at a mod I level and working from home. Currently, patient presents with deficits outlined below (see OT Problem List for details) most significantly use of continuous O2 (off upon OT arrival and remained off during full eval including amb and standing with sats 97-98 % on RA), generalized muscle weakness, decreased activity tolerance, balance and LE baseline pain impacting functional mobility and BADL's. Patient requires continued Acute hospital setting skilled OT to progress function and safety but anticipate with intermittent support at home, can return with [facility] services. Patient reports having all necessary DME/AE in place.      If plan is discharge home, recommend the following:   A little help with walking and/or transfers;A little help with bathing/dressing/bathroom;Assistance with cooking/housework;Assist for transportation;Help with stairs or ramp for entrance     Functional Status Assessment   Patient has had a recent decline in their functional status and demonstrates the ability to make significant improvements in function in a reasonable and predictable amount of time.     Equipment Recommendations   None recommended by OT      Precautions/Restrictions   Precautions Precautions: Fall Restrictions Weight Bearing Restrictions Per Provider Order: No     Mobility Bed Mobility                General bed mobility comments: recliner, remained, sleeps in recliner at home    Transfers Overall transfer level: Needs assistance Equipment used: Rolling walker (2 wheels) Transfers: Sit to/from Stand, Bed to chair/wheelchair/BSC Sit to Stand: Supervision     Step pivot transfers: Supervision     General transfer comment: short distance amb in room for ADL's and bathroom access with RW      Balance Overall balance assessment: Needs assistance Sitting-balance support: No upper extremity supported Sitting balance-Leahy Scale: Good     Standing balance support: Bilateral upper extremity supported, Reliant on assistive device for balance Standing balance-Leahy Scale: Poor Standing balance comment: increased steadiness with RW                           ADL either performed or assessed with clinical judgement   ADL Overall ADL's : Needs assistance/impaired Eating/Feeding: Independent   Grooming: Wash/dry hands;Wash/dry face;Oral care;Applying deodorant;Supervision/safety;Standing Grooming Details (indicate cue type and reason): within RW surround at counter Upper Body Bathing: Set up;Sitting   Lower Body Bathing: Sitting/lateral leans;Supervison/ safety   Upper Body Dressing : Set up;Sitting   Lower Body Dressing: Sit to/from stand;Supervision/safety   Toilet Transfer: Supervision/safety;Rolling walker (2 wheels);Comfort height toilet Toilet Transfer Details (indicate cue type and reason): placed BSC over toilet to simulate home set up Toileting- Clothing Manipulation and Hygiene: Supervision/safety   Tub/ Shower Transfer: Contact guard assist;Tub bench   Functional mobility during ADLs: Supervision/safety;Rolling walker (2 wheels) General ADL Comments: stood 5 minutes with unilateral support on RA with SpO2 sats 97%     Vision Baseline Vision/History: 0 No visual deficits;1 Wears glasses Ability  to See in Adequate Light: 0 Adequate Patient Visual  Report: No change from baseline Vision Assessment?: No apparent visual deficits;Wears glasses for reading            Pertinent Vitals/Pain Pain Assessment Pain Assessment: 0-10 Pain Location: R foot (chronic, like tooth ache) Pain Descriptors / Indicators: Aching Pain Intervention(s): Limited activity within patient's tolerance, Premedicated before session, Repositioned     Extremity/Trunk Assessment Upper Extremity Assessment Upper Extremity Assessment: Right hand dominant   Lower Extremity Assessment Lower Extremity Assessment: Defer to PT evaluation RLE Deficits / Details: Pt with hx of R LE pain.  Reports like tooth ache around foot.  States was wearing compression hose in past until she developed wound on toe.  Bil LE wtih redness mid calf down - reports baseline.  Improved during activity and reports better  when elevated   Cervical / Trunk Assessment Cervical / Trunk Assessment: Kyphotic   Communication Communication Communication: No apparent difficulties   Cognition Arousal: Alert Behavior During Therapy: WFL for tasks assessed/performed Cognition: No apparent impairments                               Following commands: Intact       Cueing  General Comments   Cueing Techniques: Verbal cues  off O2 upon OT arrival and remained off with SpO2 97% including RW use in room and grooming in standing >5 min; dried and discoloted skin lower legs Bly, patient requested lotion and was able to apply in sitting with LE's elevated           Home Living Family/patient expects to be discharged to:: Private residence Living Arrangements: Alone Available Help at Discharge: Family;Available PRN/intermittently Type of Home: House Home Access: Stairs to enter Entrance Stairs-Number of Steps: 1 four inch step   Home Layout: One level     Bathroom Shower/Tub: Tub/shower unit;Curtain   Bathroom Toilet: Handicapped height Bathroom Accessibility: Yes How  Accessible: Accessible via walker Home Equipment: Rolling Walker (2 wheels);Cane - single point;Tub bench;Toilet riser;Hand held shower head;Adaptive equipment Adaptive Equipment: Reacher Additional Comments: Sleeps in a recliner      Prior Functioning/Environment Prior Level of Function : Independent/Modified Independent;Working/employed             Mobility Comments: Uses a RW ; can ambulate short community; does not drive; does reports episode of 5 falls but resports was on prednisone and glucose was not stable - no other falls ADLs Comments: Works from home in [employer] ; able to do ADLs; has been doing [employer] ; brother assist with errands and IADLS, groceries delivered    OT Problem List: Decreased strength;Decreased activity tolerance;Impaired balance (sitting and/or standing);Cardiopulmonary status limiting activity;Pain   OT Treatment/Interventions: Self-care/ADL training;Therapeutic exercise;Neuromuscular education;Energy conservation;DME and/or AE instruction;Therapeutic activities;Patient/family education;Balance training      OT Goals(Current goals can be found in the care plan section)   Acute Rehab OT Goals Patient Stated Goal: to get stronger and not need the oxygen  OT Goal Formulation: With patient Time For Goal Achievement: 10/13/23 Potential to Achieve Goals: Good ADL Goals Pt Will Perform Lower Body Bathing: with modified independence;sit to/from stand Pt Will Perform Lower Body Dressing: with modified independence;sit to/from stand Pt Will Transfer to Toilet: with modified independence;ambulating Pt Will Perform Toileting - Clothing Manipulation and hygiene: with modified independence;sit to/from stand Pt Will Perform Tub/Shower Transfer: with modified independence;ambulating;tub bench Pt/caregiver will Perform Home Exercise Program: Increased strength;With  written HEP provided;Both right and left upper extremity;Independently Additional ADL Goal  #1: Patient will teach back and integrate ECTs during B/IADL's and mobility with min cues   OT Frequency:  Min 2X/week       AM-PAC OT "6 Clicks" Daily Activity     Outcome Measure Help from another person eating meals?: None Help from another person taking care of personal grooming?: None Help from another person toileting, which includes using toliet, bedpan, or urinal?: A Little Help from another person bathing (including washing, rinsing, drying)?: A Little Help from another person to put on and taking off regular upper body clothing?: None Help from another person to put on and taking off regular lower body clothing?: A Little 6 Click Score: 21   End of Session Equipment Utilized During Treatment: Gait belt;Rolling walker (2 wheels) Nurse Communication: Mobility status  Activity Tolerance: Patient tolerated treatment well Patient left: in chair;with call bell/phone within reach  OT Visit Diagnosis: Muscle weakness (generalized) (M62.81);Pain;Unsteadiness on feet (R26.81) Pain - part of body: Ankle and joints of foot                Time: 1330-1402 OT Time Calculation (min): 32 min Charges:  OT General Charges $OT Visit: 1 Visit OT Evaluation $OT Eval Low Complexity: 1 Low OT Treatments $Self Care/Home Management : 8-22 mins Trevor Wilkie OT/L Acute Rehabilitation Department  [phone]  09/29/2023, 2:24 PM

## 2023-09-29 NOTE — TOC Initial Note (Signed)
 Transition of Care [facility]) - Initial/Assessment Note    Patient Details  Name: Paula Wheeler MRN: [id number] Date of Birth: 28-Sep-1950  Transition of Care [facility]) CM/SW Contact:    Kathryn Parish, RN Phone Number: 09/29/2023, 4:25 PM  Clinical Narrative:                PTA lives in [place], PCP/insurance verified; DME walker, [place], BSC; No HH currently. Just released by [facility]. SDOH needs. Transport home at discharge by brother Gwinda Leopard. Discussed PT recommendation for [facility] PT. Patient is accepting and wants to go with [facility] again. TOC will follow.  Expected Discharge Plan: Home w Home Health Services Barriers to Discharge: Continued Medical Work up   Patient Goals and CMS Choice Patient states their goals for this hospitalization and ongoing recovery are:: Home with [facility] PT   Choice offered to / list presented to : NA [place] ownership interest in [facility].provided to:: Parent NA    Expected Discharge Plan and Services   Discharge Planning Services: CM Consult   Living arrangements for the past 2 months: Single Family Home                 DME Arranged: N/A DME Agency: NA       HH Arranged: PT HH Agency: [facility] Date [facility] Agency Contacted: 09/29/23 Time HH Agency Contacted: 1625 Representative spoke with at [facility] Agency: Randel Buss  Prior Living Arrangements/Services Living arrangements for the past 2 months: Single Family Home Lives with:: Self Patient language and need for interpreter reviewed:: Yes Do you feel safe going back to the place where you live?: Yes      Need for Family Participation in Patient Care: Yes (Comment) Care giver support system in place?: Yes (comment) Current home services: DME (walker, [place], BSC,) Criminal Activity/Legal Involvement Pertinent to Current Situation/Hospitalization: No - Comment as needed  Activities of Daily Living   ADL Screening (condition at time of admission) Independently performs ADLs?: Yes (appropriate  for developmental age) Is the patient deaf or have difficulty hearing?: No Does the patient have difficulty seeing, even when wearing glasses/contacts?: No Does the patient have difficulty concentrating, remembering, or making decisions?: No  Permission Sought/Granted Permission sought to share information with : Case Manager Permission granted to share information with : Yes, Verbal Permission Granted  Share Information with NAME: Gasper Karst  Permission granted to share info w AGENCY: Gasper Karst        Emotional Assessment Appearance:: Appears stated age Attitude/Demeanor/Rapport: Gracious Affect (typically observed): Appropriate Orientation: : Oriented to Self, Oriented to Place, Oriented to  Time, Oriented to Situation Alcohol / Substance Use: Not Applicable Psych Involvement: No (comment)  Admission diagnosis:  Acute respiratory failure with hypoxia (HCC) [J96.01] Acute on chronic respiratory failure with hypoxia (HCC) [J96.21] Multifocal pneumonia [J18.9] Patient Active Problem List   Diagnosis Date Noted   Acute respiratory failure with hypoxia (HCC) 09/27/2023   Anemia, iron  deficiency 09/27/2023   Cardiomegaly 09/27/2023   COPD with acute exacerbation (HCC) 09/27/2023   Mild protein malnutrition (HCC) 09/27/2023   CAP (community acquired pneumonia) 09/27/2023   Acute on chronic diastolic congestive heart failure (HCC) 09/27/2023   Skin ulcer of toe, unspecified laterality, limited to breakdown of skin (HCC) 08/20/2023   Brain lesion 07/16/2023   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 04/07/2023   Carotid artery disease (HCC) 08/06/2022   Pain due to onychomycosis of toenails of both feet 05/27/2022   Pressure injury of  skin 11/28/2019   Acute renal failure (ARF) (HCC) 11/23/2019   Lactic acidosis    Hyperkalemia    Acute encephalopathy    Sepsis (HCC)    Shock circulatory (HCC)    Acute renal failure superimposed on stage 4 chronic kidney disease (HCC) 01/29/2018   Type  II diabetes mellitus with renal manifestations (HCC) 01/29/2018   GERD (gastroesophageal reflux disease) 01/29/2018   Depression 01/29/2018   Tobacco abuse 01/29/2018   Elevated troponin 01/29/2018   Leukocytosis 01/29/2018   Chronic diastolic CHF (congestive heart failure) (HCC) 01/29/2018   CKD (chronic kidney disease) stage 4, GFR 15-29 ml/min (HCC)    SOB (shortness of breath)    PAD (peripheral artery disease) (HCC) 12/23/2017   Persistent atrial fibrillation 06/10/2016   GI bleed 05/14/2016   Symptomatic anemia 05/14/2016   Diabetes mellitus with complication (HCC) 05/14/2016   Essential hypertension 05/14/2016   HLD (hyperlipidemia) 05/14/2016   Anemia    Renal insufficiency    PCP:  Jimmey Mould, MD Pharmacy:   [id number] - [place], [place] - [address] AT [facility] [address] & [facility] [address] [place] [address] Phone: [phone] Fax: [phone]     Social Drivers of Health (SDOH) Social History: SDOH Screenings   Food Insecurity: No Food Insecurity (09/27/2023)  Housing: Low Risk  (09/27/2023)  Transportation Needs: No Transportation Needs (09/27/2023)  Utilities: Not At Risk (09/27/2023)  Social Connections: Socially Isolated (09/27/2023)  Tobacco Use: High Risk (09/27/2023)   SDOH Interventions:     Readmission Risk Interventions     No data to display

## 2023-09-29 NOTE — Progress Notes (Signed)
 PROGRESS NOTE    Paula Wheeler  EAV:[id number] DOB: May 05, 1950 DOA: 09/27/2023 PCP: Jimmey Mould, MD   Brief Narrative:  Paula Wheeler is a 73 y.o. female with medical history significant of anemia, active cigarette smoking, COPD on room air, hyperlipidemia, exogenous obesity, GERD, prior GI bleed, hypertension, iron  deficiency anemia, longstanding persistent atrial fibrillation on Eliquis , microalbuminuria, tobacco dependence, type 2 diabetes.  Patient presented to the emergency department with complaints of progressively worse dyspnea associated with wheezing and yellowish sputum producing cough for 2 to 3 days.  Hospitalist called for admission given acute hypoxia and concern for infection.   Assessment & Plan:   Principal Problem:   Acute respiratory failure with hypoxia (HCC) Active Problems:   Essential hypertension   HLD (hyperlipidemia)   Persistent atrial fibrillation   PAD (peripheral artery disease) (HCC)   Acute renal failure superimposed on stage 4 chronic kidney disease (HCC)   Type II diabetes mellitus with renal manifestations (HCC)   GERD (gastroesophageal reflux disease)   Tobacco abuse   Carotid artery disease (HCC)   COPD with acute exacerbation (HCC)   Mild protein malnutrition (HCC)   CAP (community acquired pneumonia)   Acute on chronic diastolic congestive heart failure (HCC)  Acute respiratory failure with acute hypoxia (HCC) CAP (community acquired pneumonia) COPD with acute exacerbation (HCC) - Continue broad-spectrum antibiotics, nebs -hold steroids in the interim, no notable wheeze - Follow cultures - Continue to wean oxygen , baseline patient is on room air currently weaned to room air at rest, ambulatory oxygen  screen profoundly diminished from baseline  Acute renal failure superimposed on stage 4 chronic kidney disease (HCC) Acute on chronic diastolic congestive heart failure (HCC)  - Likely secondary to volume overload - Follow  potassium and creatinine - Avoid unnecessary IV fluids, continue sodium and fluid restriction  Essential hypertension Continue bisoprolol  5 mg p.o. daily. Amlodipine  on hold   HLD (hyperlipidemia) Continue atorvastatin  80 mg p.o. bedtime.   Persistent atrial fibrillation CHA?DS?-VASc Score of 7  Continue apixaban  5 mg p.o. twice daily. Continue bisoprolol  for rate control. Tachycardia secondary to ongoing nebs   Carotid artery disease (HCC) PAD (peripheral artery disease) (HCC) On anticoagulation and statin. Follow-up with vascular as an outpatient. Smoking cessation advised.   Type II diabetes mellitus with renal manifestations (HCC), uncontrolled with hyperglycemia Carbohydrate modified diet. CBG monitoring with RI SS - will discharge on home 70-30 with close outpatient follow upw A1C >15.5 *undetectably high*   GERD (gastroesophageal reflux disease) Antiacid, H2 blocker or PPI as needed.   Tobacco abuse Tobacco cessation advised. Nicotine  replacement therapy ordered.   Mild protein malnutrition (HCC) In the setting of anemia and malignancy. May benefit from protein supplementation. Consider nutritional services evaluation. Follow-up albumin level.   DVT prophylaxis:  apixaban  (ELIQUIS ) tablet 5 mg  Code Status:   Code Status: Full Code Family Communication: None present  Status is: Inpatient  Dispo: The patient is from: Home              Anticipated d/c is to: Home              Anticipated d/c date is: 24 to 48 hours              Patient currently not medically stable for discharge  Consultants:  None  Procedures:  None  Antimicrobials:  Azithromycin ceftriaxone   Subjective: No acute issues or events overnight denies nausea vomiting diarrhea constipation any fevers chills or chest pain.  Shortness of  breath markedly improved but not yet back to baseline.  Objective: Vitals:   09/28/23 2045 09/28/23 2048 09/29/23 0230 09/29/23 0402  BP:    (!)  145/66  Pulse:    91  Resp:    20  Temp:    98.9 F (37.2 C)  TempSrc:      SpO2: 95% 95% 96% 99%  Weight:      Height:        Intake/Output Summary (Last 24 hours) at 09/29/2023 0811 Last data filed at 09/28/2023 1936 Gross per 24 hour  Intake 502.5 ml  Output 600 ml  Net -97.5 ml   Filed Weights   09/27/23 0852 09/27/23 1513 09/28/23 0315  Weight: 86.2 kg 85.8 kg 84.2 kg    Examination:  General:  Pleasantly resting in bed, No acute distress. HEENT:  Normocephalic atraumatic.  Sclerae nonicteric, noninjected.  Extraocular movements intact bilaterally. Neck:  Without mass or deformity.  Trachea is midline. Lungs:   Diminished bilaterally without overt wheeze, or rales. Heart:  Regular rate and rhythm.  Without murmurs, rubs, or gallops. Abdomen:  Soft, nontender, nondistended.  Without guarding or rebound. Extremities: Without cyanosis, clubbing, edema, or obvious deformity. Skin:  Warm and dry, no erythema.  Data Reviewed: I have personally reviewed following labs and imaging studies  CBC: Recent Labs  Lab 09/27/23 0905 09/28/23 0256 09/29/23 0524  WBC 15.2* 14.7* 14.0*  HGB 10.2* 9.3* 9.5*  HCT 32.5* 29.4* 31.3*  MCV 101.2* 100.0 102.3*  PLT 240 194 190   Basic Metabolic Panel: Recent Labs  Lab 09/27/23 0905 09/27/23 1609 09/28/23 0256 09/29/23 0524  NA 136 138 136 133*  K 3.9 4.1 3.3* 3.9  CL 98 99 100 102  CO2 24 25 23 26   GLUCOSE 336* 260* 254* 134*  BUN 44* 44* 42* 46*  CREATININE 2.18* 1.84* 2.01* 2.16*  CALCIUM  9.9 9.9 9.5 8.7*  MG 1.9 2.0  --   --   PHOS  --  3.1  --   --    GFR: Estimated Creatinine Clearance: 24.9 mL/min (A) (by C-G formula based on SCr of 2.16 mg/dL (H)). Liver Function Tests: Recent Labs  Lab 09/27/23 0905 09/28/23 0256  AST 23 11*  ALT 13 12  ALKPHOS 83 76  BILITOT 1.7* 1.6*  PROT 7.0 6.0*  ALBUMIN 3.4* 3.0*   Recent Labs  Lab 09/27/23 0905  LIPASE 43   CBG: Recent Labs  Lab 09/28/23 0751  09/28/23 1135 09/28/23 1633 09/28/23 1949 09/29/23 0726  GLUCAP 246* 201* 250* 154* 129*   Sepsis Labs: Recent Labs  Lab 09/27/23 1609 09/28/23 0256  PROCALCITON 0.32 0.35    Recent Results (from the past 240 hours)  MRSA Next Gen by PCR, Nasal     Status: None   Collection Time: 09/27/23  3:00 PM   Specimen: Nasal Mucosa; Nasal Swab  Result Value Ref Range Status   MRSA by PCR Next Gen NOT DETECTED NOT DETECTED Final    Comment: (NOTE) The GeneXpert MRSA Assay (FDA approved for NASAL specimens only), is one component of a comprehensive MRSA colonization surveillance program. It is not intended to diagnose MRSA infection nor to guide or monitor treatment for MRSA infections. Test performance is not FDA approved in patients less than 66 years old. Performed at [facility], [address], [place], [place] [address]   Blood culture (routine x 2)     Status: None (Preliminary result)   Collection Time: 09/27/23  4:09  PM   Specimen: BLOOD LEFT ARM  Result Value Ref Range Status   Specimen Description   Final    BLOOD LEFT ARM Performed at [facility], [address], [place], [place] [address]    Special Requests   Final    BOTTLES DRAWN AEROBIC AND ANAEROBIC Blood Culture results may not be optimal due to an inadequate volume of blood received in culture bottles Performed at [facility], [address], [place], [place] [address]    Culture   Final    NO GROWTH < 24 HOURS Performed at [facility], [address] [place], [place] [address]    Report Status PENDING  Incomplete         Radiology Studies: CT CHEST ABDOMEN PELVIS WO CONTRAST Result Date: 09/27/2023 CLINICAL DATA:  Sepsis. EXAM: CT CHEST, ABDOMEN AND PELVIS WITHOUT CONTRAST TECHNIQUE: Multidetector CT imaging of the chest, abdomen and pelvis was performed following the standard protocol without IV contrast. RADIATION DOSE REDUCTION: This exam was  performed according to the departmental dose-optimization program which includes automated exposure control, adjustment of the mA and/or kV according to patient size and/or use of iterative reconstruction technique. COMPARISON:  CTA chest abdomen and pelvis 11/23/2019 FINDINGS: CT CHEST FINDINGS Cardiovascular: The heart is enlarged. Small pericardial effusion. Coronary artery calcification is evident. Mild atherosclerotic calcification is noted in the wall of the thoracic aorta. Enlargement of the pulmonary outflow tract/main pulmonary arteries suggests pulmonary arterial hypertension. Mediastinum/Nodes: 2 cm left thyroid nodule evident. There is also a 2 cm nodule in the right thyroid lobe. 17 mm short axis precarinal lymph node is progressive in the interval. 15 mm short axis AP window lymph node is progressive. Hilar regions not well evaluated on noncontrast imaging. The esophagus has normal imaging features. There is no axillary lymphadenopathy. Lungs/Pleura: Collapse/consolidation seen in the right middle lobe with mild collapse/consolidation in the lingula. There is dependent collapse/consolidation in both lower lobes, right greater than left. Patchy ground-glass attenuation noted bilaterally. 6 mm left lower lobe nodule identified on 67/6. Small bilateral pleural effusions. Musculoskeletal: No worrisome lytic or sclerotic osseous abnormality. CT ABDOMEN PELVIS FINDINGS Hepatobiliary: No suspicious focal abnormality in the liver on this study without intravenous contrast. Gallbladder is distended with pericholecystic edema/fluid. No definite calcified gallstones. No intrahepatic or extrahepatic biliary dilation. Pancreas: No focal mass lesion. No dilatation of the main duct. No intraparenchymal cyst. No peripancreatic edema. Spleen: No splenomegaly. No suspicious focal mass lesion. Adrenals/Urinary Tract: Adrenal thickening without a discrete nodule or mass. Right kidney unremarkable. 16 mm exophytic water   density lesion lower pole left kidney is compatible with a cyst. No evidence for hydroureter. The urinary bladder appears normal for the degree of distention. Stomach/Bowel: Stomach is unremarkable. No gastric wall thickening. No evidence of outlet obstruction. Duodenum is normally positioned as is the ligament of Treitz. No small bowel wall thickening. No small bowel dilatation. The terminal ileum is normal. The appendix is not well visualized, but there is no edema or inflammation in the region of the cecal tip to suggest appendicitis. No gross colonic mass. No colonic wall thickening. Vascular/Lymphatic: There is advanced atherosclerotic calcification of the abdominal aorta without aneurysm. There is no gastrohepatic or hepatoduodenal ligament lymphadenopathy. No retroperitoneal or mesenteric lymphadenopathy. No pelvic sidewall lymphadenopathy. Reproductive: The uterus is unremarkable.  There is no adnexal mass. Other: Trace free fluid is seen in the pelvis. Small volume free fluid is seen around the liver. Musculoskeletal: No worrisome lytic  or sclerotic osseous abnormality. Diffuse degenerative disc disease noted lumbar spine. IMPRESSION: 1. Collapse/consolidation in the right middle lobe with mild collapse/consolidation in the lingula. Dependent collapse/consolidation in both lower lobes, right greater than left. Imaging features compatible with multifocal pneumonia. 2. Small bilateral pleural effusions. 3. Patchy ground-glass attenuation bilaterally, likely infectious/inflammatory. 4. 6 mm left lower lobe pulmonary nodule. Non-contrast chest CT at 6-12 months is recommended. If the nodule is stable at time of repeat CT, then future CT at 18-24 months (from today's scan) is considered optional for low-risk patients, but is recommended for high-risk patients. This recommendation follows the consensus statement: Guidelines for Management of Incidental Pulmonary Nodules Detected on CT Images: From the Fleischner  Society 2017; Radiology 2017; 284:228-243. 5. Progressive mediastinal lymphadenopathy. This is likely reactive but close follow-up warranted. Repeat CT chest in 3 months recommended. 6. Distended gallbladder with pericholecystic edema/fluid. No definite calcified gallstones. Right upper quadrant ultrasound recommended to further evaluate. 7. Small volume free fluid around the liver and in the pelvis. 8. Bilateral thyroid nodules measuring up to 2 cm. Recommend thyroid US  (ref: J Am Coll Radiol. 2015 Feb;12(2): 143-50). 9. Enlargement of the pulmonary outflow tract/main pulmonary arteries suggests pulmonary arterial hypertension. 10.  Aortic Atherosclerosis (ICD10-I70.0). Electronically Signed   By: Donnal Fusi M.D.   On: 09/27/2023 12:02   DG Chest 2 View Result Date: 09/27/2023 CLINICAL DATA:  Shortness of breath for 2 days.  COPD. EXAM: CHEST - 2 VIEW COMPARISON:  12/03/2019 FINDINGS: Marked cardiac enlargement and diffuse pulmonary vascular congestion are seen. Right middle lobe opacity is new since previous study and suspicious for pneumonia. Tiny right pleural effusion also noted IMPRESSION: New right middle lobe opacity and tiny right pleural effusion, suspicious for pneumonia. Recommend continued chest radiographic follow-up to confirm resolution. Marked cardiac enlargement and diffuse pulmonary vascular congestion. Electronically Signed   By: Marlyce Sine M.D.   On: 09/27/2023 09:31        Scheduled Meds:  allopurinol   100 mg Oral q AM   apixaban   5 mg Oral BID   atorvastatin   80 mg Oral QHS   bisoprolol   5 mg Oral q AM   budesonide-glycopyrrolate-formoterol  2 puff Inhalation BID   Chlorhexidine  Gluconate Cloth  6 each Topical Daily   ferrous sulfate   325 mg Oral Q breakfast   insulin  aspart  0-9 Units Subcutaneous TID WC   insulin  aspart protamine- aspart  15 Units Subcutaneous BID WC   levalbuterol   0.63 mg Nebulization Q6H   loratadine   10 mg Oral q AM   Continuous Infusions:   azithromycin 250 mL/hr at 09/28/23 1522   cefTRIAXone  (ROCEPHIN )  IV 200 mL/hr at 09/28/23 1522     LOS: 2 days   Time spent:  Haydee Lipa, DO [facility]  If 7PM-7AM, please contact night-coverage [email or web address]  09/29/2023, 8:11 AM

## 2023-09-30 DIAGNOSIS — J9601 Acute respiratory failure with hypoxia: Secondary | ICD-10-CM | POA: Diagnosis not present

## 2023-09-30 LAB — BASIC METABOLIC PANEL WITH GFR
Anion gap: 13 (ref 5–15)
BUN: 43 mg/dL — ABNORMAL HIGH (ref 8–23)
CO2: 22 mmol/L (ref 22–32)
Calcium: 8.9 mg/dL (ref 8.9–10.3)
Chloride: 101 mmol/L (ref 98–111)
Creatinine, Ser: 2.09 mg/dL — ABNORMAL HIGH (ref 0.44–1.00)
GFR, Estimated: 25 mL/min — ABNORMAL LOW (ref >60)
Glucose, Bld: 121 mg/dL — ABNORMAL HIGH (ref 70–99)
Potassium: 3.6 mmol/L (ref 3.5–5.1)
Sodium: 136 mmol/L (ref 135–145)

## 2023-09-30 LAB — GLUCOSE, CAPILLARY
Glucose-Capillary: 133 mg/dL — ABNORMAL HIGH (ref 70–99)
Glucose-Capillary: 196 mg/dL — ABNORMAL HIGH (ref 70–99)
Glucose-Capillary: 226 mg/dL — ABNORMAL HIGH (ref 70–99)

## 2023-09-30 MED ORDER — PNEUMOCOCCAL 20-VAL CONJ VACC 0.5 ML IM SUSY
0.5000 mL | PREFILLED_SYRINGE | INTRAMUSCULAR | Status: AC | PRN
  Administered 2023-09-30: 0.5 mL via INTRAMUSCULAR
  Filled 2023-09-30: qty 0.5

## 2023-09-30 MED ORDER — AZITHROMYCIN 250 MG PO TABS
500.0000 mg | ORAL_TABLET | Freq: Every day | ORAL | Status: DC
  Administered 2023-09-30: 500 mg via ORAL
  Filled 2023-09-30: qty 2

## 2023-09-30 MED ORDER — AZITHROMYCIN 250 MG PO TABS: ORAL_TABLET | ORAL | 0 refills | Status: DC

## 2023-09-30 NOTE — Plan of Care (Signed)

## 2023-09-30 NOTE — Progress Notes (Signed)
 Physical Therapy Treatment Patient Details Name: Paula Wheeler MRN: [id number] DOB: 17-Dec-1950 Today's Date: 09/30/2023   History of Present Illness Pt is 73 yo female admitted with acute resp failure due to CAP and COPD on 09/27/23.  Pt with hx including but not limited to anemia, active cigarette smoking, COPD on room air, hyperlipidemia, exogenous obesity, GERD, prior GI bleed, hypertension, iron  deficiency anemia, longstanding persistent atrial fibrillation on Eliquis , microalbuminuria, type 2 diabetes    PT Comments  Pt is progressing well with mobility, she tolerated increased ambulation distance of 120' with RW, no loss of balance, 1/4 dyspnea, SpO2 98% on room air. Instructed pt in seated BUE/LE strengthening exercises.     If plan is discharge home, recommend the following: Assistance with cooking/housework;Help with stairs or ramp for entrance   Can travel by private vehicle        Equipment Recommendations  None recommended by PT    Recommendations for Other Services       Precautions / Restrictions Precautions Precautions: Fall Restrictions Weight Bearing Restrictions Per Provider Order: No     Mobility  Bed Mobility               General bed mobility comments: up in recliner    Transfers Overall transfer level: Needs assistance Equipment used: Rolling walker (2 wheels) Transfers: Sit to/from Stand Sit to Stand: Supervision           General transfer comment: VCs hand placement    Ambulation/Gait Ambulation/Gait assistance: Contact guard assist Gait Distance (Feet): 120 Feet Assistive device: Rolling walker (2 wheels) Gait Pattern/deviations: Step-to pattern, Decreased stride length, Trunk flexed Gait velocity: decreased but functional     General Gait Details: steady, no loss of balance, 1/4 dyspnea, SpO2 98% on RA   Stairs             Wheelchair Mobility     Tilt Bed    Modified Rankin (Stroke Patients Only)        Balance Overall balance assessment: Needs assistance Sitting-balance support: No upper extremity supported Sitting balance-Leahy Scale: Good     Standing balance support: Bilateral upper extremity supported, Reliant on assistive device for balance Standing balance-Leahy Scale: Poor Standing balance comment: increased steadiness with RW                            Communication Communication Communication: No apparent difficulties  Cognition Arousal: Alert Behavior During Therapy: WFL for tasks assessed/performed                             Following commands: Intact      Cueing Cueing Techniques: Verbal cues  Exercises General Exercises - Upper Extremity Shoulder Flexion: AROM, Both, 10 reps, Seated General Exercises - Lower Extremity Ankle Circles/Pumps: AROM, Both, 10 reps, Seated Long Arc Quad: AROM, Both, 10 reps, Seated Hip Flexion/Marching: AROM, Both, 10 reps, Seated    General Comments        Pertinent Vitals/Pain Pain Assessment Pain Score: 6  Pain Location: RLE Pain Descriptors / Indicators: Aching Pain Intervention(s): Limited activity within patient's tolerance, Monitored during session, Repositioned, Patient requesting pain meds-RN notified    Home Living                          Prior Function  PT Goals (current goals can now be found in the care plan section) Acute Rehab PT Goals Patient Stated Goal: return home PT Goal Formulation: With patient Time For Goal Achievement: 10/13/23 Potential to Achieve Goals: Good Progress towards PT goals: Progressing toward goals    Frequency    Min 2X/week      PT Plan      Co-evaluation              AM-PAC PT "6 Clicks" Mobility   Outcome Measure  Help needed turning from your back to your side while in a flat bed without using bedrails?: A Little Help needed moving from lying on your back to sitting on the side of a flat bed without using  bedrails?: A Little Help needed moving to and from a bed to a chair (including a wheelchair)?: A Little Help needed standing up from a chair using your arms (e.g., wheelchair or bedside chair)?: A Little Help needed to walk in hospital room?: A Little Help needed climbing 3-5 steps with a railing? : A Little 6 Click Score: 18    End of Session Equipment Utilized During Treatment: Gait belt Activity Tolerance: Patient tolerated treatment well Patient left: in chair;with call bell/phone within reach;with nursing/sitter in room Nurse Communication: Mobility status PT Visit Diagnosis: Other abnormalities of gait and mobility (R26.89);Pain Pain - Right/Left: Right Pain - part of body: Leg     Time: 1914-7829 PT Time Calculation (min) (ACUTE ONLY): 15 min  Charges:    $Gait Training: 8-22 mins PT General Charges $$ ACUTE PT VISIT: 1 Visit                     Daymon Evans PT 09/30/2023  Acute Rehabilitation Services  Office [phone]

## 2023-09-30 NOTE — TOC Progression Note (Addendum)
 Transition of Care [facility]) - Progression Note    Patient Details  Name: Paula Wheeler MRN: [id number] Date of Birth: 1951/01/13  Transition of Care [facility]) CM/SW Contact  Kathryn Parish, RN Phone Number: 09/30/2023, 2:14 PM  Clinical Narrative:    [facility]) accepted patient for [facility] PT. Added to AVS   Expected Discharge Plan: Home w Home Health Services Barriers to Discharge: Continued Medical Work up  Expected Discharge Plan and Services   Discharge Planning Services: CM Consult   Living arrangements for the past 2 months: Single Family Home                 DME Arranged: N/A DME Agency: NA       HH Arranged: PT HH Agency: [facility] Date HH Agency Contacted: 09/30/23 Time HH Agency Contacted: 1410 Representative spoke with at [facility] Agency: [facility]   Social Determinants of Health (SDOH) Interventions SDOH Screenings   Food Insecurity: No Food Insecurity (09/27/2023)  Housing: Low Risk  (09/27/2023)  Transportation Needs: No Transportation Needs (09/27/2023)  Utilities: Not At Risk (09/27/2023)  Social Connections: Socially Isolated (09/27/2023)  Tobacco Use: High Risk (09/27/2023)    Readmission Risk Interventions     No data to display

## 2023-09-30 NOTE — Discharge Summary (Signed)
 Physician Discharge Summary  Paula Wheeler YQM:[id number] DOB: 03/04/1951 DOA: 09/27/2023  PCP: Jimmey Mould, MD  Admit date: 09/27/2023 Discharge date: 09/30/2023  Admitted From: Home Disposition: Home  Recommendations for Outpatient Follow-up:  Follow up with PCP in 1-2 weeks Follow-up with pulmonology as scheduled in the next few weeks  Home Health: None Equipment/Devices: None  Discharge Condition: Stable CODE STATUS: Full Diet recommendation: Low-salt low-fat low-carb diet  Brief/Interim Summary: Paula Wheeler is a 73 y.o. female with medical history significant of anemia, active cigarette smoking, COPD on room air, hyperlipidemia, exogenous obesity, GERD, prior GI bleed, hypertension, iron  deficiency anemia, longstanding persistent atrial fibrillation on Eliquis , microalbuminuria, tobacco dependence, type 2 diabetes.   Patient presented to the emergency department with complaints of progressively worse dyspnea associated with wheezing and yellowish sputum producing cough for 2 to 3 days.  Hospitalist called for admission given acute hypoxia and concern for infection.  Over the past 48 hours patient has continued to improve, ambulating today without any further episodes of hypoxia or dyspnea.  She otherwise stable and agreeable for discharge home.  Continue azithromycin to complete her antibiotic course, otherwise patient's renal function appears to have resolved back to baseline and her volume status is improved with increased diuretics as below.  Medication changes as below.  Follow-up with PCP and pulmonology in the next 1 to 2 weeks as discussed, follow-up with cardiology and nephrology in the next few months as scheduled.  Discharge Diagnoses:  Principal Problem:   Acute respiratory failure with hypoxia (HCC) Active Problems:   Essential hypertension   HLD (hyperlipidemia)   Persistent atrial fibrillation   PAD (peripheral artery disease) (HCC)   Acute renal  failure superimposed on stage 4 chronic kidney disease (HCC)   Type II diabetes mellitus with renal manifestations (HCC)   GERD (gastroesophageal reflux disease)   Tobacco abuse   Carotid artery disease (HCC)   COPD with acute exacerbation (HCC)   Mild protein malnutrition (HCC)   CAP (community acquired pneumonia)   Acute on chronic diastolic congestive heart failure (HCC)  Acute respiratory failure with acute hypoxia (HCC), resolved CAP (community acquired pneumonia) COPD with acute exacerbation (HCC) - Currently on room air at rest and with exertion, continue azithromycin as above   Acute renal failure superimposed on stage 4 chronic kidney disease (HCC) Acute on chronic diastolic congestive heart failure (HCC)  - Likely secondary to volume overload - Repeat labs with PCP as appropriate   Essential hypertension Resume home medications   HLD (hyperlipidemia) Continue atorvastatin    Persistent atrial fibrillation CHA?DS?-VASc Score of 7  Continue apixaban  5 mg p.o. twice daily. Continue bisoprolol  for rate control.   Carotid artery disease (HCC) PAD (peripheral artery disease) (HCC) On anticoagulation and statin. Follow-up with vascular as an outpatient. Smoking cessation advised.   Type II diabetes mellitus with renal manifestations (HCC), uncontrolled with hyperglycemia Carbohydrate modified diet. CBG monitoring with RI SS -continue 70-30 with close outpatient follow up A1C >15.5 *undetectably high*   GERD (gastroesophageal reflux disease) Antiacid, H2 blocker or PPI as needed.   Tobacco abuse Tobacco cessation advised. Nicotine  replacement therapy ordered.   Mild protein malnutrition (HCC) In the setting of anemia and malignancy. Continue supplementation per nutrition    Discharge Instructions  Discharge Instructions     Call MD for:  difficulty breathing, headache or visual disturbances   Complete by: As directed    Call MD for:  extreme fatigue    Complete by: As directed  Call MD for:  hives   Complete by: As directed    Call MD for:  persistant dizziness or light-headedness   Complete by: As directed    Call MD for:  persistant nausea and vomiting   Complete by: As directed    Call MD for:  severe uncontrolled pain   Complete by: As directed    Call MD for:  temperature >100.4   Complete by: As directed    Diet - low sodium heart healthy   Complete by: As directed    Increase activity slowly   Complete by: As directed    No wound care   Complete by: As directed       Allergies as of 09/30/2023       Reactions   Nsaids Swelling   Hands and legs swell   Penicillins Hives   Has patient had a PCN reaction causing immediate rash, facial/tongue/throat swelling, SOB or lightheadedness with hypotension: Yes Has patient had a PCN reaction causing severe rash involving mucus membranes or skin necrosis: No Has patient had a PCN reaction that required hospitalization: No Has patient had a PCN reaction occurring within the last 10 years: No If all of the above answers are "NO", then may proceed with Cephalosporin use.   Quinapril  Hcl Other (See Comments)   Other reaction(s): elevated potassium and creatinine        Medication List     TAKE these medications    acetaminophen  500 MG tablet Commonly known as: TYLENOL  Take 500 mg by mouth every 6 (six) hours as needed for mild pain (pain score 1-3) or moderate pain (pain score 4-6).   albuterol  108 (90 Base) MCG/ACT inhaler Commonly known as: VENTOLIN  HFA Inhale 2 puffs into the lungs every 4 (four) hours as needed for wheezing or shortness of breath.   allopurinol  100 MG tablet Commonly known as: ZYLOPRIM  Take 100 mg by mouth in the morning.   amLODipine  5 MG tablet Commonly known as: NORVASC  Take 5 mg by mouth every morning.   apixaban  5 MG Tabs tablet Commonly known as: Eliquis  Take 1 tablet (5 mg total) by mouth 2 (two) times daily.   atorvastatin  80 MG  tablet Commonly known as: LIPITOR Take 1 tablet (80 mg total) by mouth at bedtime.   azithromycin 250 MG tablet Commonly known as: ZITHROMAX Take 1 tablet p.o. daily until completed Start taking on: October 01, 2023   bisoprolol  5 MG tablet Commonly known as: ZEBETA  TAKE 1 TABLET(5 MG) BY MOUTH DAILY What changed: See the new instructions.   ferrous sulfate  325 (65 FE) MG tablet Take 1 tablet (325 mg total) by mouth daily with breakfast.   HYDROcodone -acetaminophen  5-325 MG tablet Commonly known as: NORCO/VICODIN Take 1 tablet by mouth every 6 (six) hours as needed for moderate pain.   insulin  NPH-regular Human (70-30) 100 UNIT/ML injection Inject 6 Units into the skin 2 (two) times daily with a meal. Inject 30 units into the skin in the morning and 70 units into the skin in the evening What changed:  how much to take additional instructions   Insulin  Syringe-Needle U-100 31G X 5/16" 1 ML Misc by Does not apply route.   loratadine  10 MG tablet Commonly known as: CLARITIN  Take 10 mg by mouth in the morning.   meclizine 12.5 MG tablet Commonly known as: ANTIVERT Take 1-2 tablets by mouth as needed for dizziness or nausea.   metolazone 2.5 MG tablet Commonly known as: ZAROXOLYN Take 2.5 mg  by mouth daily as needed.   tiZANidine 4 MG tablet Commonly known as: ZANAFLEX Take 4 mg by mouth as needed for muscle spasms.   torsemide  20 MG tablet Commonly known as: DEMADEX  60mg  (3 tablets) in the morning and 20mg  (1 tablet) in the afternoon   Trelegy Ellipta  100-62.5-25 MCG/ACT Aepb Generic drug: Fluticasone -Umeclidin-Vilant INHALE 1 PUFF INTO THE LUNGS DAILY What changed: when to take this        Follow-up Information     [facility].   Specialty: Home Health Services Why: Home Health Physical Theray Will call to arrange visit. Contact information: [address] [place] [address] [phone]                Allergies   Allergen Reactions   Nsaids Swelling    Hands and legs swell   Penicillins Hives    Has patient had a PCN reaction causing immediate rash, facial/tongue/throat swelling, SOB or lightheadedness with hypotension: Yes Has patient had a PCN reaction causing severe rash involving mucus membranes or skin necrosis: No Has patient had a PCN reaction that required hospitalization: No Has patient had a PCN reaction occurring within the last 10 years: No If all of the above answers are "NO", then may proceed with Cephalosporin use.    Quinapril  Hcl Other (See Comments)    Other reaction(s): elevated potassium and creatinine    Consultations: None  Procedures/Studies: CT CHEST ABDOMEN PELVIS WO CONTRAST Result Date: 09/27/2023 CLINICAL DATA:  Sepsis. EXAM: CT CHEST, ABDOMEN AND PELVIS WITHOUT CONTRAST TECHNIQUE: Multidetector CT imaging of the chest, abdomen and pelvis was performed following the standard protocol without IV contrast. RADIATION DOSE REDUCTION: This exam was performed according to the departmental dose-optimization program which includes automated exposure control, adjustment of the mA and/or kV according to patient size and/or use of iterative reconstruction technique. COMPARISON:  CTA chest abdomen and pelvis 11/23/2019 FINDINGS: CT CHEST FINDINGS Cardiovascular: The heart is enlarged. Small pericardial effusion. Coronary artery calcification is evident. Mild atherosclerotic calcification is noted in the wall of the thoracic aorta. Enlargement of the pulmonary outflow tract/main pulmonary arteries suggests pulmonary arterial hypertension. Mediastinum/Nodes: 2 cm left thyroid nodule evident. There is also a 2 cm nodule in the right thyroid lobe. 17 mm short axis precarinal lymph node is progressive in the interval. 15 mm short axis AP window lymph node is progressive. Hilar regions not well evaluated on noncontrast imaging. The esophagus has normal imaging features. There is no axillary  lymphadenopathy. Lungs/Pleura: Collapse/consolidation seen in the right middle lobe with mild collapse/consolidation in the lingula. There is dependent collapse/consolidation in both lower lobes, right greater than left. Patchy ground-glass attenuation noted bilaterally. 6 mm left lower lobe nodule identified on 67/6. Small bilateral pleural effusions. Musculoskeletal: No worrisome lytic or sclerotic osseous abnormality. CT ABDOMEN PELVIS FINDINGS Hepatobiliary: No suspicious focal abnormality in the liver on this study without intravenous contrast. Gallbladder is distended with pericholecystic edema/fluid. No definite calcified gallstones. No intrahepatic or extrahepatic biliary dilation. Pancreas: No focal mass lesion. No dilatation of the main duct. No intraparenchymal cyst. No peripancreatic edema. Spleen: No splenomegaly. No suspicious focal mass lesion. Adrenals/Urinary Tract: Adrenal thickening without a discrete nodule or mass. Right kidney unremarkable. 16 mm exophytic water  density lesion lower pole left kidney is compatible with a cyst. No evidence for hydroureter. The urinary bladder appears normal for the degree of distention. Stomach/Bowel: Stomach is unremarkable. No gastric wall thickening. No evidence of outlet obstruction. Duodenum  is normally positioned as is the ligament of Treitz. No small bowel wall thickening. No small bowel dilatation. The terminal ileum is normal. The appendix is not well visualized, but there is no edema or inflammation in the region of the cecal tip to suggest appendicitis. No gross colonic mass. No colonic wall thickening. Vascular/Lymphatic: There is advanced atherosclerotic calcification of the abdominal aorta without aneurysm. There is no gastrohepatic or hepatoduodenal ligament lymphadenopathy. No retroperitoneal or mesenteric lymphadenopathy. No pelvic sidewall lymphadenopathy. Reproductive: The uterus is unremarkable.  There is no adnexal mass. Other: Trace free  fluid is seen in the pelvis. Small volume free fluid is seen around the liver. Musculoskeletal: No worrisome lytic or sclerotic osseous abnormality. Diffuse degenerative disc disease noted lumbar spine. IMPRESSION: 1. Collapse/consolidation in the right middle lobe with mild collapse/consolidation in the lingula. Dependent collapse/consolidation in both lower lobes, right greater than left. Imaging features compatible with multifocal pneumonia. 2. Small bilateral pleural effusions. 3. Patchy ground-glass attenuation bilaterally, likely infectious/inflammatory. 4. 6 mm left lower lobe pulmonary nodule. Non-contrast chest CT at 6-12 months is recommended. If the nodule is stable at time of repeat CT, then future CT at 18-24 months (from today's scan) is considered optional for low-risk patients, but is recommended for high-risk patients. This recommendation follows the consensus statement: Guidelines for Management of Incidental Pulmonary Nodules Detected on CT Images: From the Fleischner Society 2017; Radiology 2017; 284:228-243. 5. Progressive mediastinal lymphadenopathy. This is likely reactive but close follow-up warranted. Repeat CT chest in 3 months recommended. 6. Distended gallbladder with pericholecystic edema/fluid. No definite calcified gallstones. Right upper quadrant ultrasound recommended to further evaluate. 7. Small volume free fluid around the liver and in the pelvis. 8. Bilateral thyroid nodules measuring up to 2 cm. Recommend thyroid US  (ref: J Am Coll Radiol. 2015 Feb;12(2): 143-50). 9. Enlargement of the pulmonary outflow tract/main pulmonary arteries suggests pulmonary arterial hypertension. 10.  Aortic Atherosclerosis (ICD10-I70.0). Electronically Signed   By: Donnal Fusi M.D.   On: 09/27/2023 12:02   DG Chest 2 View Result Date: 09/27/2023 CLINICAL DATA:  Shortness of breath for 2 days.  COPD. EXAM: CHEST - 2 VIEW COMPARISON:  12/03/2019 FINDINGS: Marked cardiac enlargement and diffuse  pulmonary vascular congestion are seen. Right middle lobe opacity is new since previous study and suspicious for pneumonia. Tiny right pleural effusion also noted IMPRESSION: New right middle lobe opacity and tiny right pleural effusion, suspicious for pneumonia. Recommend continued chest radiographic follow-up to confirm resolution. Marked cardiac enlargement and diffuse pulmonary vascular congestion. Electronically Signed   By: Marlyce Sine M.D.   On: 09/27/2023 09:31     Subjective: No acute issues or events overnight   Discharge Exam: Vitals:   09/30/23 0628 09/30/23 1314  BP:  135/64  Pulse:  89  Resp:  16  Temp:  97.8 F (36.6 C)  SpO2: 99% 98%   Vitals:   09/29/23 1922 09/30/23 0429 09/30/23 0628 09/30/23 1314  BP: 129/61 131/66  135/64  Pulse: 89 89  89  Resp: 17 17  16   Temp: 97.6 F (36.4 C) 98.1 F (36.7 C)  97.8 F (36.6 C)  TempSrc: Oral Oral  Oral  SpO2: 97% 96% 99% 98%  Weight:      Height:        General: Pt is alert, awake, not in acute distress Cardiovascular: RRR, S1/S2 +, no rubs, no gallops Respiratory: CTA bilaterally, no wheezing, no rhonchi Abdominal: Soft, NT, ND, bowel sounds + Extremities: no edema, no cyanosis  The results of significant diagnostics from this hospitalization (including imaging, microbiology, ancillary and laboratory) are listed below for reference.     Microbiology: Recent Results (from the past 240 hours)  Blood culture (routine x 2)     Status: None (Preliminary result)   Collection Time: 09/27/23  1:09 PM   Specimen: BLOOD  Result Value Ref Range Status   Specimen Description   Final    BLOOD RIGHT ANTECUBITAL Performed at [facility], [address] [place], [place] [address]    Special Requests   Final    BOTTLES DRAWN AEROBIC AND ANAEROBIC Blood Culture adequate volume Performed at [facility], [address] [place], [place] [address]    Culture   Final    NO  GROWTH 3 DAYS Performed at [facility], [address] [place], [place] [address]    Report Status PENDING  Incomplete  MRSA Next Gen by PCR, Nasal     Status: None   Collection Time: 09/27/23  3:00 PM   Specimen: Nasal Mucosa; Nasal Swab  Result Value Ref Range Status   MRSA by PCR Next Gen NOT DETECTED NOT DETECTED Final    Comment: (NOTE) The GeneXpert MRSA Assay (FDA approved for NASAL specimens only), is one component of a comprehensive MRSA colonization surveillance program. It is not intended to diagnose MRSA infection nor to guide or monitor treatment for MRSA infections. Test performance is not FDA approved in patients less than 2 years old. Performed at [facility], [address] [place], [place] [address]   Blood culture (routine x 2)     Status: None (Preliminary result)   Collection Time: 09/27/23  4:09 PM   Specimen: BLOOD LEFT ARM  Result Value Ref Range Status   Specimen Description   Final    BLOOD LEFT ARM Performed at [facility], [address], [place], [place] [address]    Special Requests   Final    BOTTLES DRAWN AEROBIC AND ANAEROBIC Blood Culture results may not be optimal due to an inadequate volume of blood received in culture bottles Performed at [facility], [address] [place], [place] [address]    Culture   Final    NO GROWTH 3 DAYS Performed at [facility], [address], [place], [place] [address]    Report Status PENDING  Incomplete     Labs: BNP (last 3 results) Recent Labs    09/27/23 0905  BNP 194.6*   Basic Metabolic Panel: Recent Labs  Lab 09/27/23 0905 09/27/23 1609 09/28/23 0256 09/29/23 0524 09/30/23 0420  NA 136 138 136 133* 136  K 3.9 4.1 3.3* 3.9 3.6  CL 98 99 100 102 101  CO2 24 25 23 26 22   GLUCOSE 336* 260* 254* 134* 121*  BUN 44* 44* 42* 46* 43*  CREATININE 2.18* 1.84* 2.01* 2.16* 2.09*  CALCIUM  9.9 9.9 9.5 8.7* 8.9  MG 1.9 2.0  --   --    --   PHOS  --  3.1  --   --   --    Liver Function Tests: Recent Labs  Lab 09/27/23 0905 09/28/23 0256  AST 23 11*  ALT 13 12  ALKPHOS 83 76  BILITOT 1.7* 1.6*  PROT 7.0 6.0*  ALBUMIN 3.4* 3.0*   Recent Labs  Lab 09/27/23 0905  LIPASE 43   No results for input(s): "AMMONIA" in the last 168 hours. CBC: Recent  Labs  Lab 09/27/23 0905 09/28/23 0256 09/29/23 0524  WBC 15.2* 14.7* 14.0*  HGB 10.2* 9.3* 9.5*  HCT 32.5* 29.4* 31.3*  MCV 101.2* 100.0 102.3*  PLT 240 194 190   Cardiac Enzymes: No results for input(s): "CKTOTAL", "CKMB", "CKMBINDEX", "TROPONINI" in the last 168 hours. BNP: Invalid input(s): "POCBNP" CBG: Recent Labs  Lab 09/29/23 1231 09/29/23 1604 09/29/23 2039 09/30/23 0743 09/30/23 1217  GLUCAP 121* 199* 177* 133* 196*   D-Dimer No results for input(s): "DDIMER" in the last 72 hours. Hgb A1c No results for input(s): "HGBA1C" in the last 72 hours. Lipid Profile No results for input(s): "CHOL", "HDL", "LDLCALC", "TRIG", "CHOLHDL", "LDLDIRECT" in the last 72 hours. Thyroid function studies No results for input(s): "TSH", "T4TOTAL", "T3FREE", "THYROIDAB" in the last 72 hours.  Invalid input(s): "FREET3" Anemia work up No results for input(s): "VITAMINB12", "FOLATE", "FERRITIN", "TIBC", "IRON ", "RETICCTPCT" in the last 72 hours. Urinalysis    Component Value Date/Time   COLORURINE YELLOW 07/16/2023 1500   APPEARANCEUR HAZY (A) 07/16/2023 1500   LABSPEC 1.021 07/16/2023 1500   PHURINE 6.0 07/16/2023 1500   GLUCOSEU >=500 (A) 07/16/2023 1500   HGBUR SMALL (A) 07/16/2023 1500   BILIRUBINUR NEGATIVE 07/16/2023 1500   KETONESUR NEGATIVE 07/16/2023 1500   PROTEINUR NEGATIVE 07/16/2023 1500   NITRITE NEGATIVE 07/16/2023 1500   LEUKOCYTESUR LARGE (A) 07/16/2023 1500   Sepsis Labs Recent Labs  Lab 09/27/23 0905 09/28/23 0256 09/29/23 0524  WBC 15.2* 14.7* 14.0*   Microbiology Recent Results (from the past 240 hours)  Blood culture  (routine x 2)     Status: None (Preliminary result)   Collection Time: 09/27/23  1:09 PM   Specimen: BLOOD  Result Value Ref Range Status   Specimen Description   Final    BLOOD RIGHT ANTECUBITAL Performed at [facility], [address] [place], [place] [address]    Special Requests   Final    BOTTLES DRAWN AEROBIC AND ANAEROBIC Blood Culture adequate volume Performed at [facility], [address] [place], [place] [address]    Culture   Final    NO GROWTH 3 DAYS Performed at [facility], [address], [place], [place] [address]    Report Status PENDING  Incomplete  MRSA Next Gen by PCR, Nasal     Status: None   Collection Time: 09/27/23  3:00 PM   Specimen: Nasal Mucosa; Nasal Swab  Result Value Ref Range Status   MRSA by PCR Next Gen NOT DETECTED NOT DETECTED Final    Comment: (NOTE) The GeneXpert MRSA Assay (FDA approved for NASAL specimens only), is one component of a comprehensive MRSA colonization surveillance program. It is not intended to diagnose MRSA infection nor to guide or monitor treatment for MRSA infections. Test performance is not FDA approved in patients less than 67 years old. Performed at [facility], [address] [place], [place] [address]   Blood culture (routine x 2)     Status: None (Preliminary result)   Collection Time: 09/27/23  4:09 PM   Specimen: BLOOD LEFT ARM  Result Value Ref Range Status   Specimen Description   Final    BLOOD LEFT ARM Performed at [facility], [address], [place], [place] [address]    Special Requests   Final    BOTTLES DRAWN AEROBIC AND ANAEROBIC Blood Culture results may not be optimal due to an inadequate volume of blood received in culture bottles Performed  at [facility], [address] [place], [place] [address]    Culture   Final    NO GROWTH 3 DAYS Performed at [facility], [address],  [place], [place] [address]    Report Status PENDING  Incomplete     Time coordinating discharge: Over 30 minutes  SIGNED:   Haydee Lipa, DO [facility] 09/30/2023, 3:43 PM Pager   If 7PM-7AM, please contact night-coverage [email or web address]

## 2023-09-30 NOTE — Progress Notes (Signed)
 OT Cancellation Note  Patient Details Name: Paula Wheeler MRN: [id number] DOB: [birth year]   Cancelled Treatment:    Reason Eval/Treat Not Completed: Other (comment). PT is in the room working with the pt.    Sheralyn Dies, OTR/L 09/30/2023, 5:21 PM

## 2023-10-02 LAB — CULTURE, BLOOD (ROUTINE X 2)
Culture: NO GROWTH
Culture: NO GROWTH
Special Requests: ADEQUATE

## 2023-11-19 ENCOUNTER — Ambulatory Visit (INDEPENDENT_AMBULATORY_CARE_PROVIDER_SITE_OTHER): Admitting: Podiatry

## 2023-11-19 ENCOUNTER — Encounter: Payer: Self-pay | Admitting: Podiatry

## 2023-11-19 ENCOUNTER — Other Ambulatory Visit: Payer: Self-pay | Admitting: Lab

## 2023-11-19 DIAGNOSIS — M79675 Pain in left toe(s): Secondary | ICD-10-CM

## 2023-11-19 DIAGNOSIS — E118 Type 2 diabetes mellitus with unspecified complications: Secondary | ICD-10-CM

## 2023-11-19 DIAGNOSIS — I96 Gangrene, not elsewhere classified: Secondary | ICD-10-CM

## 2023-11-19 DIAGNOSIS — M79674 Pain in right toe(s): Secondary | ICD-10-CM | POA: Diagnosis not present

## 2023-11-19 DIAGNOSIS — B351 Tinea unguium: Secondary | ICD-10-CM

## 2023-11-19 MED ORDER — DOXYCYCLINE HYCLATE 100 MG PO TABS: 100.0000 mg | ORAL_TABLET | Freq: Two times a day (BID) | ORAL | 0 refills | Status: DC

## 2023-11-19 NOTE — Progress Notes (Signed)
 This patient returns to my office for at risk foot care.  This patient requires this care by a professional since this patient will be at risk due to having diabetes.  This patient is unable to cut nails herself since the patient cannot reach her nails.These nails are painful walking and wearing shoes.  This patient presents for at risk foot care today.  General Appearance  Alert, conversant and in no acute stress.  Vascular  Dorsalis pedis and posterior tibial  pulses are palpable  bilaterally.  Capillary return is within normal limits  bilaterally. Temperature is within normal limits  bilaterally.  Neurologic  Senn-Weinstein monofilament wire test within normal limits  bilaterally. Muscle power within normal limits bilaterally.  Nails Thick disfigured discolored nails with subungual debris  from hallux to fifth toes bilaterally. No evidence of bacterial infection or drainage bilaterally.  Orthopedic  No limitations of motion  feet .  No crepitus or effusions noted.  Hammer toes 2,3 left foot.  Skin  normotropic skin with no porokeratosis noted bilaterally.  No signs of infections or ulcers  Clavi third toe left and second toe left  noted.  Skin necrosis left heel and fourth toe right foot.   Onychomycosis  Pain in right toes  Pain in left toes  Hammer toes left foot.  Consent was obtained for treatment procedures.   Mechanical debridement of nails 1-5  bilaterally performed with a nail nipper.  Filed with dremel without incident.  Prescribe doxycycline    Return office visit     3 months                 Told patient to return for periodic foot care and evaluation due to potential at risk complications.   Cordella Bold DPM

## 2023-12-12 ENCOUNTER — Other Ambulatory Visit: Payer: Self-pay | Admitting: Pulmonary Disease

## 2023-12-12 DIAGNOSIS — R0602 Shortness of breath: Secondary | ICD-10-CM

## 2023-12-21 ENCOUNTER — Other Ambulatory Visit: Payer: Self-pay | Admitting: Pulmonary Disease

## 2023-12-21 DIAGNOSIS — R0602 Shortness of breath: Secondary | ICD-10-CM

## 2024-01-02 ENCOUNTER — Other Ambulatory Visit: Payer: Self-pay | Admitting: Family Medicine

## 2024-01-02 DIAGNOSIS — R9389 Abnormal findings on diagnostic imaging of other specified body structures: Secondary | ICD-10-CM

## 2024-01-24 ENCOUNTER — Encounter (HOSPITAL_COMMUNITY): Payer: Self-pay

## 2024-01-24 ENCOUNTER — Emergency Department (HOSPITAL_COMMUNITY)

## 2024-01-24 ENCOUNTER — Encounter (HOSPITAL_COMMUNITY)

## 2024-01-24 ENCOUNTER — Inpatient Hospital Stay (HOSPITAL_COMMUNITY)
Admission: EM | Admit: 2024-01-24 | Discharge: 2024-02-05 | DRG: 871 | Disposition: A | Discharge status: Skilled Nursing Facility

## 2024-01-24 ENCOUNTER — Other Ambulatory Visit: Payer: Self-pay

## 2024-01-24 DIAGNOSIS — E1122 Type 2 diabetes mellitus with diabetic chronic kidney disease: Secondary | ICD-10-CM | POA: Diagnosis present

## 2024-01-24 DIAGNOSIS — E785 Hyperlipidemia, unspecified: Secondary | ICD-10-CM | POA: Diagnosis present

## 2024-01-24 DIAGNOSIS — E6609 Other obesity due to excess calories: Secondary | ICD-10-CM | POA: Diagnosis present

## 2024-01-24 DIAGNOSIS — Z7901 Long term (current) use of anticoagulants: Secondary | ICD-10-CM

## 2024-01-24 DIAGNOSIS — I5033 Acute on chronic diastolic (congestive) heart failure: Secondary | ICD-10-CM | POA: Diagnosis present

## 2024-01-24 DIAGNOSIS — E118 Type 2 diabetes mellitus with unspecified complications: Secondary | ICD-10-CM | POA: Diagnosis not present

## 2024-01-24 DIAGNOSIS — J449 Chronic obstructive pulmonary disease, unspecified: Secondary | ICD-10-CM | POA: Diagnosis present

## 2024-01-24 DIAGNOSIS — Z83719 Family history of colon polyps, unspecified: Secondary | ICD-10-CM

## 2024-01-24 DIAGNOSIS — Z794 Long term (current) use of insulin: Secondary | ICD-10-CM | POA: Diagnosis not present

## 2024-01-24 DIAGNOSIS — A419 Sepsis, unspecified organism: Secondary | ICD-10-CM | POA: Diagnosis present

## 2024-01-24 DIAGNOSIS — I10 Essential (primary) hypertension: Secondary | ICD-10-CM | POA: Diagnosis not present

## 2024-01-24 DIAGNOSIS — Z6831 Body mass index (BMI) 31.0-31.9, adult: Secondary | ICD-10-CM

## 2024-01-24 DIAGNOSIS — E872 Acidosis, unspecified: Secondary | ICD-10-CM | POA: Diagnosis present

## 2024-01-24 DIAGNOSIS — D649 Anemia, unspecified: Secondary | ICD-10-CM | POA: Diagnosis not present

## 2024-01-24 DIAGNOSIS — L0390 Cellulitis, unspecified: Secondary | ICD-10-CM

## 2024-01-24 DIAGNOSIS — E1151 Type 2 diabetes mellitus with diabetic peripheral angiopathy without gangrene: Secondary | ICD-10-CM | POA: Diagnosis present

## 2024-01-24 DIAGNOSIS — D509 Iron deficiency anemia, unspecified: Secondary | ICD-10-CM | POA: Diagnosis not present

## 2024-01-24 DIAGNOSIS — Z886 Allergy status to analgesic agent status: Secondary | ICD-10-CM

## 2024-01-24 DIAGNOSIS — L03115 Cellulitis of right lower limb: Secondary | ICD-10-CM | POA: Diagnosis present

## 2024-01-24 DIAGNOSIS — L89616 Pressure-induced deep tissue damage of right heel: Secondary | ICD-10-CM | POA: Diagnosis present

## 2024-01-24 DIAGNOSIS — Z792 Long term (current) use of antibiotics: Secondary | ICD-10-CM

## 2024-01-24 DIAGNOSIS — I5032 Chronic diastolic (congestive) heart failure: Secondary | ICD-10-CM | POA: Diagnosis not present

## 2024-01-24 DIAGNOSIS — E11649 Type 2 diabetes mellitus with hypoglycemia without coma: Secondary | ICD-10-CM | POA: Diagnosis not present

## 2024-01-24 DIAGNOSIS — E538 Deficiency of other specified B group vitamins: Secondary | ICD-10-CM | POA: Diagnosis present

## 2024-01-24 DIAGNOSIS — F17210 Nicotine dependence, cigarettes, uncomplicated: Secondary | ICD-10-CM | POA: Diagnosis present

## 2024-01-24 DIAGNOSIS — L89313 Pressure ulcer of right buttock, stage 3: Secondary | ICD-10-CM | POA: Diagnosis present

## 2024-01-24 DIAGNOSIS — F32A Depression, unspecified: Secondary | ICD-10-CM | POA: Diagnosis present

## 2024-01-24 DIAGNOSIS — I739 Peripheral vascular disease, unspecified: Secondary | ICD-10-CM | POA: Diagnosis not present

## 2024-01-24 DIAGNOSIS — L03116 Cellulitis of left lower limb: Secondary | ICD-10-CM | POA: Diagnosis present

## 2024-01-24 DIAGNOSIS — Z7951 Long term (current) use of inhaled steroids: Secondary | ICD-10-CM

## 2024-01-24 DIAGNOSIS — E1129 Type 2 diabetes mellitus with other diabetic kidney complication: Secondary | ICD-10-CM | POA: Diagnosis present

## 2024-01-24 DIAGNOSIS — R6520 Severe sepsis without septic shock: Secondary | ICD-10-CM | POA: Diagnosis present

## 2024-01-24 DIAGNOSIS — D62 Acute posthemorrhagic anemia: Secondary | ICD-10-CM | POA: Diagnosis present

## 2024-01-24 DIAGNOSIS — Z880 Allergy status to penicillin: Secondary | ICD-10-CM

## 2024-01-24 DIAGNOSIS — I4821 Permanent atrial fibrillation: Secondary | ICD-10-CM | POA: Diagnosis present

## 2024-01-24 DIAGNOSIS — K2091 Esophagitis, unspecified with bleeding: Secondary | ICD-10-CM | POA: Diagnosis present

## 2024-01-24 DIAGNOSIS — Z888 Allergy status to other drugs, medicaments and biological substances status: Secondary | ICD-10-CM

## 2024-01-24 DIAGNOSIS — S80829A Blister (nonthermal), unspecified lower leg, initial encounter: Secondary | ICD-10-CM | POA: Diagnosis present

## 2024-01-24 DIAGNOSIS — K2970 Gastritis, unspecified, without bleeding: Secondary | ICD-10-CM | POA: Diagnosis present

## 2024-01-24 DIAGNOSIS — K219 Gastro-esophageal reflux disease without esophagitis: Secondary | ICD-10-CM | POA: Diagnosis not present

## 2024-01-24 DIAGNOSIS — L8990 Pressure ulcer of unspecified site, unspecified stage: Secondary | ICD-10-CM | POA: Diagnosis present

## 2024-01-24 DIAGNOSIS — N184 Chronic kidney disease, stage 4 (severe): Secondary | ICD-10-CM | POA: Diagnosis present

## 2024-01-24 DIAGNOSIS — L89626 Pressure-induced deep tissue damage of left heel: Secondary | ICD-10-CM | POA: Diagnosis present

## 2024-01-24 DIAGNOSIS — Z79899 Other long term (current) drug therapy: Secondary | ICD-10-CM

## 2024-01-24 DIAGNOSIS — Z751 Person awaiting admission to adequate facility elsewhere: Secondary | ICD-10-CM

## 2024-01-24 DIAGNOSIS — K5900 Constipation, unspecified: Secondary | ICD-10-CM | POA: Diagnosis not present

## 2024-01-24 DIAGNOSIS — I130 Hypertensive heart and chronic kidney disease with heart failure and stage 1 through stage 4 chronic kidney disease, or unspecified chronic kidney disease: Secondary | ICD-10-CM | POA: Diagnosis present

## 2024-01-24 DIAGNOSIS — Z8249 Family history of ischemic heart disease and other diseases of the circulatory system: Secondary | ICD-10-CM

## 2024-01-24 DIAGNOSIS — I4819 Other persistent atrial fibrillation: Secondary | ICD-10-CM | POA: Diagnosis present

## 2024-01-24 DIAGNOSIS — Z604 Social exclusion and rejection: Secondary | ICD-10-CM | POA: Diagnosis present

## 2024-01-24 LAB — CBC WITH DIFFERENTIAL/PLATELET
Abs Immature Granulocytes: 0.09 K/uL — ABNORMAL HIGH (ref 0.00–0.07)
Basophils Absolute: 0 K/uL (ref 0.0–0.1)
Basophils Relative: 0 %
Eosinophils Absolute: 0.1 K/uL (ref 0.0–0.5)
Eosinophils Relative: 1 %
HCT: 19.2 % — ABNORMAL LOW (ref 36.0–46.0)
Hemoglobin: 5.2 g/dL — CL (ref 12.0–15.0)
Immature Granulocytes: 1 %
Lymphocytes Relative: 3 %
Lymphs Abs: 0.5 K/uL — ABNORMAL LOW (ref 0.7–4.0)
MCH: 23.2 pg — ABNORMAL LOW (ref 26.0–34.0)
MCHC: 27.1 g/dL — ABNORMAL LOW (ref 30.0–36.0)
MCV: 85.7 fL (ref 80.0–100.0)
Monocytes Absolute: 0.6 K/uL (ref 0.1–1.0)
Monocytes Relative: 4 %
Neutro Abs: 13.7 K/uL — ABNORMAL HIGH (ref 1.7–7.7)
Neutrophils Relative %: 91 %
Platelets: 323 K/uL (ref 150–400)
RBC: 2.24 MIL/uL — ABNORMAL LOW (ref 3.87–5.11)
RDW: 20.2 % — ABNORMAL HIGH (ref 11.5–15.5)
WBC: 14.9 K/uL — ABNORMAL HIGH (ref 4.0–10.5)
nRBC: 0.2 % (ref 0.0–0.2)

## 2024-01-24 LAB — I-STAT CG4 LACTIC ACID, ED
Lactic Acid, Venous: 3.4 mmol/L (ref 0.5–1.9)
Lactic Acid, Venous: 1.7 mmol/L (ref 0.5–1.9)

## 2024-01-24 LAB — COMPREHENSIVE METABOLIC PANEL WITH GFR
ALT: 13 U/L (ref 0–44)
AST: 12 U/L — ABNORMAL LOW (ref 15–41)
Albumin: 3.3 g/dL — ABNORMAL LOW (ref 3.5–5.0)
Alkaline Phosphatase: 108 U/L (ref 38–126)
Anion gap: 15 (ref 5–15)
BUN: 74 mg/dL — ABNORMAL HIGH (ref 8–23)
CO2: 22 mmol/L (ref 22–32)
Calcium: 9.6 mg/dL (ref 8.9–10.3)
Chloride: 98 mmol/L (ref 98–111)
Creatinine, Ser: 2.34 mg/dL — ABNORMAL HIGH (ref 0.44–1.00)
GFR, Estimated: 21 mL/min — ABNORMAL LOW (ref >60)
Glucose, Bld: 309 mg/dL — ABNORMAL HIGH (ref 70–99)
Potassium: 4.1 mmol/L (ref 3.5–5.1)
Sodium: 135 mmol/L (ref 135–145)
Total Bilirubin: 0.7 mg/dL (ref 0.0–1.2)
Total Protein: 5.6 g/dL — ABNORMAL LOW (ref 6.5–8.1)

## 2024-01-24 LAB — GLUCOSE, CAPILLARY
Glucose-Capillary: 280 mg/dL — ABNORMAL HIGH (ref 70–99)
Glucose-Capillary: 349 mg/dL — ABNORMAL HIGH (ref 70–99)

## 2024-01-24 LAB — PREPARE RBC (CROSSMATCH)

## 2024-01-24 MED ORDER — SODIUM CHLORIDE 0.9 % IV SOLN
2.0000 g | Freq: Once | INTRAVENOUS | Status: AC
  Administered 2024-01-24: 2 g via INTRAVENOUS
  Filled 2024-01-24: qty 12.5

## 2024-01-24 MED ORDER — ACETAMINOPHEN 325 MG PO TABS
650.0000 mg | ORAL_TABLET | Freq: Four times a day (QID) | ORAL | Status: DC | PRN
  Administered 2024-01-24 – 2024-02-01 (×2): 650 mg via ORAL
  Filled 2024-01-24 (×2): qty 2

## 2024-01-24 MED ORDER — ONDANSETRON HCL 4 MG PO TABS: 4.0000 mg | ORAL_TABLET | Freq: Four times a day (QID) | ORAL | Status: DC | PRN

## 2024-01-24 MED ORDER — HYDROCODONE-ACETAMINOPHEN 5-325 MG PO TABS
1.0000 | ORAL_TABLET | Freq: Four times a day (QID) | ORAL | Status: DC | PRN
  Administered 2024-01-24 – 2024-01-25 (×2): 1 via ORAL
  Filled 2024-01-24 (×2): qty 1

## 2024-01-24 MED ORDER — TRAZODONE HCL 50 MG PO TABS: 25.0000 mg | ORAL_TABLET | Freq: Every evening | ORAL | Status: DC | PRN

## 2024-01-24 MED ORDER — VANCOMYCIN HCL IN DEXTROSE 1-5 GM/200ML-% IV SOLN
1000.0000 mg | INTRAVENOUS | Status: DC
  Administered 2024-01-26: 1000 mg via INTRAVENOUS
  Filled 2024-01-24: qty 200

## 2024-01-24 MED ORDER — SODIUM CHLORIDE 0.9% IV SOLUTION: Freq: Once | INTRAVENOUS | Status: DC

## 2024-01-24 MED ORDER — PANTOPRAZOLE SODIUM 40 MG IV SOLR
40.0000 mg | INTRAVENOUS | Status: DC
  Administered 2024-01-24 – 2024-01-25 (×2): 40 mg via INTRAVENOUS
  Filled 2024-01-24 (×2): qty 10

## 2024-01-24 MED ORDER — VANCOMYCIN HCL 1750 MG/350ML IV SOLN
1750.0000 mg | Freq: Once | INTRAVENOUS | Status: AC
  Administered 2024-01-24: 1750 mg via INTRAVENOUS
  Filled 2024-01-24: qty 350

## 2024-01-24 MED ORDER — INSULIN ASPART 100 UNIT/ML IJ SOLN
0.0000 [IU] | Freq: Every day | INTRAMUSCULAR | Status: DC
  Administered 2024-01-24: 4 [IU] via SUBCUTANEOUS
  Administered 2024-01-28: 2 [IU] via SUBCUTANEOUS

## 2024-01-24 MED ORDER — ALBUTEROL SULFATE (2.5 MG/3ML) 0.083% IN NEBU
2.5000 mg | INHALATION_SOLUTION | RESPIRATORY_TRACT | Status: DC | PRN
  Administered 2024-01-25: 2.5 mg via RESPIRATORY_TRACT
  Filled 2024-01-24: qty 3

## 2024-01-24 MED ORDER — SODIUM CHLORIDE 0.9 % IV BOLUS
1000.0000 mL | Freq: Once | INTRAVENOUS | Status: DC
Start: 2024-01-24 — End: 2024-01-24

## 2024-01-24 MED ORDER — ACETAMINOPHEN 650 MG RE SUPP: 650.0000 mg | Freq: Four times a day (QID) | RECTAL | Status: DC | PRN

## 2024-01-24 MED ORDER — SODIUM CHLORIDE 0.9 % IV BOLUS
500.0000 mL | Freq: Once | INTRAVENOUS | Status: AC
  Administered 2024-01-24: 500 mL via INTRAVENOUS

## 2024-01-24 MED ORDER — ONDANSETRON HCL 4 MG/2ML IJ SOLN
4.0000 mg | Freq: Four times a day (QID) | INTRAMUSCULAR | Status: DC | PRN
  Administered 2024-01-28 – 2024-02-03 (×3): 4 mg via INTRAVENOUS
  Filled 2024-01-24 (×3): qty 2

## 2024-01-24 MED ORDER — INSULIN ASPART 100 UNIT/ML IJ SOLN
0.0000 [IU] | Freq: Three times a day (TID) | INTRAMUSCULAR | Status: DC
  Administered 2024-01-25 (×2): 5 [IU] via SUBCUTANEOUS
  Administered 2024-01-25: 3 [IU] via SUBCUTANEOUS
  Administered 2024-01-26 – 2024-01-27 (×3): 2 [IU] via SUBCUTANEOUS
  Administered 2024-01-27: 5 [IU] via SUBCUTANEOUS
  Administered 2024-01-28: 3 [IU] via SUBCUTANEOUS
  Administered 2024-01-28: 5 [IU] via SUBCUTANEOUS
  Administered 2024-01-29 – 2024-02-02 (×13): 3 [IU] via SUBCUTANEOUS
  Administered 2024-02-02: 5 [IU] via SUBCUTANEOUS
  Administered 2024-02-02 – 2024-02-03 (×4): 3 [IU] via SUBCUTANEOUS
  Administered 2024-02-04: 2 [IU] via SUBCUTANEOUS
  Administered 2024-02-04 – 2024-02-05 (×3): 3 [IU] via SUBCUTANEOUS
  Administered 2024-02-05: 2 [IU] via SUBCUTANEOUS

## 2024-01-24 NOTE — ED Triage Notes (Addendum)
 Patient said she began getting blisters on both of her legs and feet after taking doxycycline . Has worsened after taking it over the last week. Legs feel swollen and painful.

## 2024-01-24 NOTE — Progress Notes (Signed)
 Pharmacy Antibiotic Note  Paula Wheeler is a 73 y.o. female admitted on 01/24/2024 with cellulitis.  Pharmacy has been consulted for Vancomycin  dosing.  Active Problem(s): bilateral lower extremity pain, redness, blistering. Difficulty ambulating, edema - has been on doxycycline  in the recent 1 to 2 weeks for treatment of infection. She is not currently on antibiotics. (Patient said she began getting blisters on both of her legs and feet after taking doxycycline .  )  ID: Cellulitis Afebrile, WBC 14.9, LA 3.4 elevated, Scr 2.34  Vanco 9/27>> Cefepime  9/27> 1  Plan: Vanco 1750mg  IV x 1 then Vancomycin  1000 mg IV Q 48 hrs. Goal AUC 400-550. Expected AUC: 529 SCr used: 2.34   Height: 5' 6 (167.6 cm) Weight: 89.8 kg (198 lb) IBW/kg (Calculated) : 59.3  Temp (24hrs), Avg:97.8 F (36.6 C), Min:97.8 F (36.6 C), Max:97.8 F (36.6 C)  Recent Labs  Lab 01/24/24 1032 01/24/24 1124 01/24/24 1319  WBC 14.9*  --   --   CREATININE 2.34*  --   --   LATICACIDVEN  --  3.4* 1.7    Estimated Creatinine Clearance: 24.2 mL/min (A) (by C-G formula based on SCr of 2.34 mg/dL (H)).    Allergies  Allergen Reactions   Nsaids Swelling    Hands and legs swell   Penicillins Hives    Has patient had a PCN reaction causing immediate rash, facial/tongue/throat swelling, SOB or lightheadedness with hypotension: Yes Has patient had a PCN reaction causing severe rash involving mucus membranes or skin necrosis: No Has patient had a PCN reaction that required hospitalization: No Has patient had a PCN reaction occurring within the last 10 years: No If all of the above answers are NO, then may proceed with Cephalosporin use.    Quinapril  Hcl Other (See Comments)    Other reaction(s): elevated potassium and creatinine    Paula Wheeler Paula Wheeler, PharmD, BCPS Clinical Staff Pharmacist  [facility] 01/24/2024 2:47 PM

## 2024-01-24 NOTE — ED Provider Notes (Signed)
 [place] EMERGENCY DEPARTMENT AT [facility] Provider Note   CSN: [id number] Arrival date & time: 01/24/24  1005     Patient presents with: Skin Problem   Paula Wheeler is a 73 y.o. female.   73 year old female with prior medical history as detailed below presents for evaluation.  Patient complains of bilateral lower extremity pain, redness, blistering.  Right leg with more blistering than the right.  Left leg with more pain than the right.  Patient reports difficulty ambulating because of the pain.  She reports increased edema to both lower extremities -left greater than right.  She has been on doxycycline  in the recent 1 to 2 weeks for treatment of infection.  She is not currently on antibiotics.  PMH includes anemia, active cigarette smoking, COPD on room air, hyperlipidemia, exogenous obesity, GERD, prior GI bleed, hypertension, iron  deficiency anemia, longstanding persistent atrial fibrillation on Eliquis , microalbuminuria, tobacco dependence, type 2 diabetes.  The history is provided by the patient and medical records.       Prior to Admission medications   Medication Sig Start Date End Date Taking? Authorizing Provider  acetaminophen  (TYLENOL ) 500 MG tablet Take 500 mg by mouth every 6 (six) hours as needed for mild pain (pain score 1-3) or moderate pain (pain score 4-6).    [provider]  albuterol  (PROVENTIL  HFA;VENTOLIN  HFA) 108 (90 Base) MCG/ACT inhaler Inhale 2 puffs into the lungs every 4 (four) hours as needed for wheezing or shortness of breath.    [provider]  allopurinol  (ZYLOPRIM ) 100 MG tablet Take 100 mg by mouth in the morning.    [provider]  amLODipine  (NORVASC ) 5 MG tablet Take 5 mg by mouth every morning. 04/26/20   [provider]  apixaban  (ELIQUIS ) 5 MG TABS tablet Take 1 tablet (5 mg total) by mouth 2 (two) times daily. 08/20/23   Nahser, Aleene PARAS, MD  atorvastatin  (LIPITOR) 80 MG tablet Take 1  tablet (80 mg total) by mouth at bedtime. 08/20/23 11/18/23  Nahser, Aleene PARAS, MD  azithromycin  (ZITHROMAX ) 250 MG tablet Take 1 tablet p.o. daily until completed 10/01/23   Lue Elsie BROCKS, MD  bisoprolol  (ZEBETA ) 5 MG tablet TAKE 1 TABLET(5 MG) BY MOUTH DAILY Patient taking differently: Take 5 mg by mouth in the morning. 07/30/22   Nahser, Aleene PARAS, MD  doxycycline  (VIBRA -TABS) 100 MG tablet Take 1 tablet (100 mg total) by mouth 2 (two) times daily. 11/19/23   Loreda Hacker, DPM  ferrous sulfate  325 (65 FE) MG tablet Take 1 tablet (325 mg total) by mouth daily with breakfast. 11/30/19   Regalado, Owen A, MD  Fluticasone -Umeclidin-Vilant (TRELEGY ELLIPTA ) 100-62.5-25 MCG/ACT AEPB INHALE 1 PUFF INTO THE LUNGS DAILY 12/12/23   Kara Dorn NOVAK, MD  HYDROcodone -acetaminophen  (NORCO/VICODIN) 5-325 MG tablet Take 1 tablet by mouth every 6 (six) hours as needed for moderate pain.    [provider]  insulin  NPH-regular Human (70-30) 100 UNIT/ML injection Inject 6 Units into the skin 2 (two) times daily with a meal. Inject 30 units into the skin in the morning and 70 units into the skin in the evening Patient taking differently: Inject 30-60 Units into the skin 2 (two) times daily with a meal. Inject 30 units into the skin in the morning and 60 units into the skin in the evening 11/29/19   Regalado, Belkys A, MD  Insulin  Syringe-Needle U-100 31G X 5/16 1 ML MISC by Does not apply route.    [provider]  loratadine  (CLARITIN ) 10 MG tablet Take 10 mg by mouth in the morning.    [provider]  meclizine (ANTIVERT) 12.5 MG tablet Take 1-2 tablets by mouth as needed for dizziness or nausea. 07/15/23   [provider]  metolazone (ZAROXOLYN) 2.5 MG tablet Take 2.5 mg by mouth daily as needed. 07/05/22   [provider]  tiZANidine (ZANAFLEX) 4 MG tablet Take 4 mg by mouth as needed for muscle spasms. 07/12/22   [provider]  torsemide  (DEMADEX ) 20 MG tablet  60mg  (3 tablets) in the morning and 20mg  (1 tablet) in the afternoon    [provider]    Allergies: Nsaids, Penicillins, and Quinapril  hcl    Review of Systems  All other systems reviewed and are negative.   Updated Vital Signs BP (!) 106/55   Pulse 71   Temp 97.8 F (36.6 C) (Oral)   Resp 19   Ht 5' 6 (1.676 m)   Wt 89.8 kg   SpO2 99%   BMI 31.96 kg/m   Physical Exam Vitals and nursing note reviewed.  Constitutional:      General: She is not in acute distress.    Appearance: Normal appearance. She is well-developed.  HENT:     Head: Normocephalic and atraumatic.  Eyes:     Conjunctiva/sclera: Conjunctivae normal.     Pupils: Pupils are equal, round, and reactive to light.  Cardiovascular:     Rate and Rhythm: Normal rate and regular rhythm.     Heart sounds: Normal heart sounds.  Pulmonary:     Effort: Pulmonary effort is normal. No respiratory distress.     Breath sounds: Normal breath sounds.  Abdominal:     General: There is no distension.     Palpations: Abdomen is soft.     Tenderness: There is no abdominal tenderness.  Musculoskeletal:        General: No deformity. Normal range of motion.     Cervical back: Normal range of motion and neck supple.     Right lower leg: Edema present.     Left lower leg: Edema present.  Skin:    General: Skin is warm and dry.     Comments: Erythema and blistering to both lower extremities.  See images below.  Neurological:     General: No focal deficit present.     Mental Status: She is alert and oriented to person, place, and time.          (all labs ordered are listed, but only abnormal results are displayed) Labs Reviewed  CBC WITH DIFFERENTIAL/PLATELET  COMPREHENSIVE METABOLIC PANEL WITH GFR    EKG: None  Radiology: No results found.   Procedures   Medications Ordered in the ED - No data to display                                  Medical Decision Making Patient presents with  complaint of bilateral lower extremity erythema and blistering.  Exam is concerning for cellulitis.  On workup patient is noted to be anemic with hemoglobin of 5.  She would benefit from transfusion.  She is agreeable with plan to transfuse.  Antibiotics initiated for treatment of possible cellulitis.  Patient would benefit from admission.  Hospitalist service is aware of case.   Amount and/or Complexity of Data Reviewed Labs: ordered. Radiology: ordered.  Risk Prescription drug management. Decision regarding hospitalization.  Final diagnoses:  Anemia, unspecified type  Cellulitis, unspecified cellulitis site    ED Discharge Orders     None          Laurice Maude BROCKS, MD 01/24/24 1342

## 2024-01-24 NOTE — ED Notes (Signed)
 Patient accepted changing into a gown but refused to stand up or lay in the bed to take pants off. Patient opting to sit on side of bed at this time.

## 2024-01-24 NOTE — Progress Notes (Signed)
 ED Pharmacy Antibiotic Sign Off An antibiotic consult was received from an ED provider for vanc/cefepime  per pharmacy dosing for sepsis. A chart review was completed to assess appropriateness.   The following one time order(s) were placed:  Vancomycin  1750mg   Cefepime  2g  Further antibiotic and/or antibiotic pharmacy consults should be ordered by the admitting provider if indicated.   Thank you for allowing pharmacy to be a part of this patient's care.   Britta Eva Na, [facility]  Clinical Pharmacist 01/24/24 1:04 PM

## 2024-01-24 NOTE — ED Notes (Signed)
 Patient denies pain and is resting comfortably.

## 2024-01-24 NOTE — H&P (Signed)
 History and Physical  Paula Wheeler FMW:[id number] DOB: April 17, 1951 DOA: 01/24/2024  PCP: Okey Carlin Redbird, MD   Chief Complaint: Leg pain and blisters  HPI: Paula Wheeler is a 73 y.o. female with medical history significant for CKD stage IV, prior upper GI bleed, longstanding persistent atrial fibrillation on Eliquis , type 2 diabetes being admitted to the hospital with sepsis due to bilateral lower extremity cellulitis found to have acute on chronic anemia.  Patient states that for the last couple of weeks she has been feeling fatigued, she also has chronic lower extremity mild edema and erythema which has worsened.  She recently took a course of p.o. doxycycline  to treat presumed cellulitis, however she developed worsening discomfort and blisters draining clear fluid.  She denies any fevers or chills, denies any abdominal pain, nausea or vomiting.  Denies any hematochezia, or melena states that her bowel movements have been normal.  She has continued to take her home Eliquis , the last dose was this morning about 3 AM.  Came to the emergency department for evaluation of her lower extremities, she has been started on empiric IV antibiotics to cover for cellulitis was also incidentally found to have a hemoglobin of 5.2.  2 unit blood transfusion has been ordered, and hospitalist admission was requested.  Review of Systems: Please see HPI for pertinent positives and negatives. A complete 10 system review of systems are otherwise negative.  Past Medical History:  Diagnosis Date   Anemia    COPD (chronic obstructive pulmonary disease) (HCC)    Dyslipidemia    Exogenous obesity    GERD (gastroesophageal reflux disease)    GI bleed 05/14/2016   History of abnormal cells from cervix    Hyperlipidemia    Hypertension    Iron  deficiency anemia    Longstanding persistent atrial fibrillation (HCC)    Microalbuminuria    Tobacco dependence    Type II diabetes mellitus (HCC)    Past Surgical  History:  Procedure Laterality Date   CARDIOVERSION N/A 08/07/2016   Procedure: CARDIOVERSION;  Surgeon: Aleene JINNY Passe, MD;  Location: [facility] ENDOSCOPY;  Service: Cardiovascular;  Laterality: N/A;   DIAGNOSTIC MAMMOGRAM  05/2015   ESOPHAGOGASTRODUODENOSCOPY N/A 05/15/2016   Procedure: ESOPHAGOGASTRODUODENOSCOPY (EGD);  Surgeon: Lesta JULIANNA Fitz, MD;  Location: [facility] ENDOSCOPY;  Service: Endoscopy;  Laterality: N/A;   EXPLORATORY LAPAROTOMY  1968   I think they left my appendix   PAP SMEAR  2012 AND 2017   Social History:  reports that she has been smoking cigarettes. She has a 20 pack-year smoking history. She has never been exposed to tobacco smoke. She has never used smokeless tobacco. She reports current alcohol use. She reports that she does not use drugs.  Allergies  Allergen Reactions   Nsaids Swelling    Hands and legs swell   Penicillins Hives    Has patient had a PCN reaction causing immediate rash, facial/tongue/throat swelling, SOB or lightheadedness with hypotension: Yes Has patient had a PCN reaction causing severe rash involving mucus membranes or skin necrosis: No Has patient had a PCN reaction that required hospitalization: No Has patient had a PCN reaction occurring within the last 10 years: No If all of the above answers are NO, then may proceed with Cephalosporin use.    Quinapril  Hcl Other (See Comments)    Other reaction(s): elevated potassium and creatinine    Family History  Problem Relation Age of Onset   Cancer Mother        BREAST  Hypertension Mother    Macular degeneration Mother    Heart attack Father    Diabetes Father    Colon polyps Brother    Colon polyps Brother    Colon polyps Brother    Colon polyps Brother    Colon polyps Brother    Colon polyps Brother      Prior to Admission medications   Medication Sig Start Date End Date Taking? Authorizing Provider  acetaminophen  (TYLENOL ) 500 MG tablet Take 500 mg by mouth every 6 (six) hours as  needed for mild pain (pain score 1-3) or moderate pain (pain score 4-6).    [provider]  albuterol  (PROVENTIL  HFA;VENTOLIN  HFA) 108 (90 Base) MCG/ACT inhaler Inhale 2 puffs into the lungs every 4 (four) hours as needed for wheezing or shortness of breath.    [provider]  allopurinol  (ZYLOPRIM ) 100 MG tablet Take 100 mg by mouth in the morning.    [provider]  amLODipine  (NORVASC ) 5 MG tablet Take 5 mg by mouth every morning. 04/26/20   [provider]  apixaban  (ELIQUIS ) 5 MG TABS tablet Take 1 tablet (5 mg total) by mouth 2 (two) times daily. 08/20/23   Nahser, Aleene PARAS, MD  atorvastatin  (LIPITOR) 80 MG tablet Take 1 tablet (80 mg total) by mouth at bedtime. 08/20/23 11/18/23  Nahser, Aleene PARAS, MD  azithromycin  (ZITHROMAX ) 250 MG tablet Take 1 tablet p.o. daily until completed 10/01/23   Lue Elsie BROCKS, MD  bisoprolol  (ZEBETA ) 5 MG tablet TAKE 1 TABLET(5 MG) BY MOUTH DAILY Patient taking differently: Take 5 mg by mouth in the morning. 07/30/22   Nahser, Aleene PARAS, MD  doxycycline  (VIBRA -TABS) 100 MG tablet Take 1 tablet (100 mg total) by mouth 2 (two) times daily. 11/19/23   Loreda Hacker, DPM  ferrous sulfate  325 (65 FE) MG tablet Take 1 tablet (325 mg total) by mouth daily with breakfast. 11/30/19   Regalado, Belkys A, MD  Fluticasone -Umeclidin-Vilant (TRELEGY ELLIPTA ) 100-62.5-25 MCG/ACT AEPB INHALE 1 PUFF INTO THE LUNGS DAILY 12/12/23   Kara Dorn NOVAK, MD  HYDROcodone -acetaminophen  (NORCO/VICODIN) 5-325 MG tablet Take 1 tablet by mouth every 6 (six) hours as needed for moderate pain.    [provider]  insulin  NPH-regular Human (70-30) 100 UNIT/ML injection Inject 6 Units into the skin 2 (two) times daily with a meal. Inject 30 units into the skin in the morning and 70 units into the skin in the evening Patient taking differently: Inject 30-60 Units into the skin 2 (two) times daily with a meal. Inject 30 units into the skin in the morning  and 60 units into the skin in the evening 11/29/19   Regalado, Belkys A, MD  Insulin  Syringe-Needle U-100 31G X 5/16 1 ML MISC by Does not apply route.    [provider]  loratadine  (CLARITIN ) 10 MG tablet Take 10 mg by mouth in the morning.    [provider]  meclizine (ANTIVERT) 12.5 MG tablet Take 1-2 tablets by mouth as needed for dizziness or nausea. 07/15/23   [provider]  metolazone (ZAROXOLYN) 2.5 MG tablet Take 2.5 mg by mouth daily as needed. 07/05/22   [provider]  tiZANidine (ZANAFLEX) 4 MG tablet Take 4 mg by mouth as needed for muscle spasms. 07/12/22   [provider]  torsemide  (DEMADEX ) 20 MG tablet 60mg  (3 tablets) in the morning and 20mg  (1 tablet) in the afternoon    [provider]    Physical Exam: BP ROLLEN)  121/45 (BP Location: Left Arm)   Pulse (!) 58   Temp 97.8 F (36.6 C) (Oral)   Resp 19   Ht 5' 6 (1.676 m)   Wt 89.8 kg   SpO2 100%   BMI 31.96 kg/m  General:  Alert, oriented, calm, in no acute distress, looks nontoxic, looks slightly pale Cardiovascular: RRR, no murmurs or rubs, no peripheral edema  Respiratory: clear to auscultation bilaterally, no wheezes, no crackles  Abdomen: soft, nontender, nondistended, normal bowel tones heard  Skin: dry, she has bilateral lower extremity edema as pictured below, without areas of fluctuance or any obvious drainage Musculoskeletal: no joint effusions, normal range of motion  Psychiatric: appropriate affect, normal speech  Neurologic: extraocular muscles intact, clear speech, moving all extremities with intact sensorium              Labs on Admission:  Basic Metabolic Panel: Recent Labs  Lab 01/24/24 1032  NA 135  K 4.1  CL 98  CO2 22  GLUCOSE 309*  BUN 74*  CREATININE 2.34*  CALCIUM  9.6   Liver Function Tests: Recent Labs  Lab 01/24/24 1032  AST 12*  ALT 13  ALKPHOS 108  BILITOT 0.7  PROT 5.6*  ALBUMIN 3.3*   No results for input(s):  LIPASE, AMYLASE in the last 168 hours. No results for input(s): AMMONIA in the last 168 hours. CBC: Recent Labs  Lab 01/24/24 1032  WBC 14.9*  NEUTROABS 13.7*  HGB 5.2*  HCT 19.2*  MCV 85.7  PLT 323   Cardiac Enzymes: No results for input(s): CKTOTAL, CKMB, CKMBINDEX, TROPONINI in the last 168 hours. BNP (last 3 results) Recent Labs    09/27/23 0905  BNP 194.6*    ProBNP (last 3 results) No results for input(s): PROBNP in the last 8760 hours.  CBG: No results for input(s): GLUCAP in the last 168 hours.  Radiological Exams on Admission: DG Chest Port 1 View Result Date: 01/24/2024 EXAM: 1 VIEW(S) XRAY OF THE CHEST 01/24/2024 01:08:00 PM COMPARISON: 09/27/2023 CLINICAL HISTORY: weakness. Weakness in legs FINDINGS: LUNGS AND PLEURA: No focal pulmonary opacity. No pulmonary edema. There is a small to moderate right pleural effusion and small left effusion. No pneumothorax. Bibasilar atelectasis. HEART AND MEDIASTINUM: Cardiomegaly, unchanged. Aortic calcification. BONES AND SOFT TISSUES: No acute osseous abnormality. IMPRESSION: 1. Small to moderate right pleural effusion and small left pleural effusion. 2. Bibasilar atelectasis. 3. Cardiomegaly, unchanged. 4. Aortic atherosclerosis. Electronically signed by: Waddell Calk MD 01/24/2024 01:29 PM EDT RP Workstation: [id number]   DG Foot Complete Left Result Date: 01/24/2024 CLINICAL DATA:  pain Patient reports bilateral foot blisters after taking doxycycline . Leg swelling and pain. EXAM: LEFT FOOT - COMPLETE 3+ VIEW; RIGHT FOOT COMPLETE - 3+ VIEW; LEFT TIBIA AND FIBULA - 2 VIEW; RIGHT TIBIA AND FIBULA - 2 VIEW COMPARISON:  None Available. FINDINGS: Right lower leg: Mild osseous demineralization. No evidence of acute fracture, dislocation or bone destruction. There are mild degenerative changes at the right knee and ankle. Mild generalized subcutaneous edema without evidence of foreign body or soft tissue emphysema.  Scattered vascular calcifications. Left lower leg: Mild osseous demineralization. No evidence of acute fracture, dislocation or bone destruction. There are mild degenerative changes at the knee and ankle. Generalized subcutaneous edema without evidence of foreign body or soft tissue emphysema. Scattered vascular calcifications. Bilateral feet: The bones are demineralized. There are transverse sclerotic bands traversing the 2nd through 5th metatarsal heads bilaterally, suspicious for chronic stress fractures. No displaced fracture, dislocation or bone  destruction identified. There are mild degenerative changes at the 1st metatarsophalangeal joint bilaterally. There are midfoot degenerative changes with prominent dorsal navicular spurring on the right. Small bidirectional calcaneal spurs, larger on the left. Dorsal forefoot soft tissue swelling bilaterally. No evidence of foreign body or soft tissue emphysema. IMPRESSION: 1. No evidence of acute fracture, dislocation or bone destruction in the lower legs or feet. 2. Transverse sclerotic bands traversing the 2nd through 5th metatarsal heads bilaterally, suspicious for chronic stress fractures. Correlate clinically. 3. Nonspecific soft tissue swelling in both lower legs and feet. No evidence of foreign body or soft tissue emphysema. Electronically Signed   By: Elsie Perone M.D.   On: 01/24/2024 11:41   DG Foot Complete Right Result Date: 01/24/2024 CLINICAL DATA:  pain Patient reports bilateral foot blisters after taking doxycycline . Leg swelling and pain. EXAM: LEFT FOOT - COMPLETE 3+ VIEW; RIGHT FOOT COMPLETE - 3+ VIEW; LEFT TIBIA AND FIBULA - 2 VIEW; RIGHT TIBIA AND FIBULA - 2 VIEW COMPARISON:  None Available. FINDINGS: Right lower leg: Mild osseous demineralization. No evidence of acute fracture, dislocation or bone destruction. There are mild degenerative changes at the right knee and ankle. Mild generalized subcutaneous edema without evidence of foreign body  or soft tissue emphysema. Scattered vascular calcifications. Left lower leg: Mild osseous demineralization. No evidence of acute fracture, dislocation or bone destruction. There are mild degenerative changes at the knee and ankle. Generalized subcutaneous edema without evidence of foreign body or soft tissue emphysema. Scattered vascular calcifications. Bilateral feet: The bones are demineralized. There are transverse sclerotic bands traversing the 2nd through 5th metatarsal heads bilaterally, suspicious for chronic stress fractures. No displaced fracture, dislocation or bone destruction identified. There are mild degenerative changes at the 1st metatarsophalangeal joint bilaterally. There are midfoot degenerative changes with prominent dorsal navicular spurring on the right. Small bidirectional calcaneal spurs, larger on the left. Dorsal forefoot soft tissue swelling bilaterally. No evidence of foreign body or soft tissue emphysema. IMPRESSION: 1. No evidence of acute fracture, dislocation or bone destruction in the lower legs or feet. 2. Transverse sclerotic bands traversing the 2nd through 5th metatarsal heads bilaterally, suspicious for chronic stress fractures. Correlate clinically. 3. Nonspecific soft tissue swelling in both lower legs and feet. No evidence of foreign body or soft tissue emphysema. Electronically Signed   By: Elsie Perone M.D.   On: 01/24/2024 11:41   DG Tibia/Fibula Left Result Date: 01/24/2024 CLINICAL DATA:  pain Patient reports bilateral foot blisters after taking doxycycline . Leg swelling and pain. EXAM: LEFT FOOT - COMPLETE 3+ VIEW; RIGHT FOOT COMPLETE - 3+ VIEW; LEFT TIBIA AND FIBULA - 2 VIEW; RIGHT TIBIA AND FIBULA - 2 VIEW COMPARISON:  None Available. FINDINGS: Right lower leg: Mild osseous demineralization. No evidence of acute fracture, dislocation or bone destruction. There are mild degenerative changes at the right knee and ankle. Mild generalized subcutaneous edema without  evidence of foreign body or soft tissue emphysema. Scattered vascular calcifications. Left lower leg: Mild osseous demineralization. No evidence of acute fracture, dislocation or bone destruction. There are mild degenerative changes at the knee and ankle. Generalized subcutaneous edema without evidence of foreign body or soft tissue emphysema. Scattered vascular calcifications. Bilateral feet: The bones are demineralized. There are transverse sclerotic bands traversing the 2nd through 5th metatarsal heads bilaterally, suspicious for chronic stress fractures. No displaced fracture, dislocation or bone destruction identified. There are mild degenerative changes at the 1st metatarsophalangeal joint bilaterally. There are midfoot degenerative changes with prominent dorsal navicular spurring  on the right. Small bidirectional calcaneal spurs, larger on the left. Dorsal forefoot soft tissue swelling bilaterally. No evidence of foreign body or soft tissue emphysema. IMPRESSION: 1. No evidence of acute fracture, dislocation or bone destruction in the lower legs or feet. 2. Transverse sclerotic bands traversing the 2nd through 5th metatarsal heads bilaterally, suspicious for chronic stress fractures. Correlate clinically. 3. Nonspecific soft tissue swelling in both lower legs and feet. No evidence of foreign body or soft tissue emphysema. Electronically Signed   By: Elsie Perone M.D.   On: 01/24/2024 11:41   DG Tibia/Fibula Right Result Date: 01/24/2024 CLINICAL DATA:  pain Patient reports bilateral foot blisters after taking doxycycline . Leg swelling and pain. EXAM: LEFT FOOT - COMPLETE 3+ VIEW; RIGHT FOOT COMPLETE - 3+ VIEW; LEFT TIBIA AND FIBULA - 2 VIEW; RIGHT TIBIA AND FIBULA - 2 VIEW COMPARISON:  None Available. FINDINGS: Right lower leg: Mild osseous demineralization. No evidence of acute fracture, dislocation or bone destruction. There are mild degenerative changes at the right knee and ankle. Mild generalized  subcutaneous edema without evidence of foreign body or soft tissue emphysema. Scattered vascular calcifications. Left lower leg: Mild osseous demineralization. No evidence of acute fracture, dislocation or bone destruction. There are mild degenerative changes at the knee and ankle. Generalized subcutaneous edema without evidence of foreign body or soft tissue emphysema. Scattered vascular calcifications. Bilateral feet: The bones are demineralized. There are transverse sclerotic bands traversing the 2nd through 5th metatarsal heads bilaterally, suspicious for chronic stress fractures. No displaced fracture, dislocation or bone destruction identified. There are mild degenerative changes at the 1st metatarsophalangeal joint bilaterally. There are midfoot degenerative changes with prominent dorsal navicular spurring on the right. Small bidirectional calcaneal spurs, larger on the left. Dorsal forefoot soft tissue swelling bilaterally. No evidence of foreign body or soft tissue emphysema. IMPRESSION: 1. No evidence of acute fracture, dislocation or bone destruction in the lower legs or feet. 2. Transverse sclerotic bands traversing the 2nd through 5th metatarsal heads bilaterally, suspicious for chronic stress fractures. Correlate clinically. 3. Nonspecific soft tissue swelling in both lower legs and feet. No evidence of foreign body or soft tissue emphysema. Electronically Signed   By: Elsie Perone M.D.   On: 01/24/2024 11:41   Assessment/Plan Paula Wheeler is a 73 y.o. female with medical history significant for CKD stage IV, prior upper GI bleed, longstanding persistent atrial fibrillation on Eliquis , type 2 diabetes being admitted to the hospital with sepsis due to bilateral lower extremity cellulitis found to have acute on chronic anemia.   Severe sepsis due to cellulitis-meeting criteria with tachycardia, leukocytosis, initial lactate 3.4.  Source is bilateral lower extremity cellulitis which did not  improve with outpatient doxycycline . -Inpatient admission -Patient given 500 cc NS bolus, lactic acid has improved and patient is hemodynamically stable -Empiric IV vancomycin  -Follow-up blood cultures  Acute on chronic anemia-baseline hemoglobin in the 9-10 range, for the last several weeks patient has been feeling fatigued intermittently dizzy, found to have hemoglobin of 5.2.  No history or evidence of blood loss, though she does have documented history of upper GI bleed several years ago.  Was previously seen by [facility], has no current gastroenterologist. -Will plan to transfuse 2 unit PRBC -Check anemia panel -Monitor posttransfusion hemoglobin, with goal greater than 7 -Patient refused rectal exam in the ER, will check stool guaiac -Will keep on clear liquid diet for the time being and hold her Eliquis  in case she has further bleeding and requires endoscopy  this hospitalization -Discussed inpatient GI consultation with the patient, she strongly prefers outpatient GI follow-up as she does not want to wait in the hospital for Eliquis  to wash out of her system -Will plan to monitor as above, but explained to patient that she will require inpatient workup and GI consultation if evidence of active bleeding or persistent anemia  CKD stage IV-renal function appears to be at baseline  History of chronic iron  deficiency anemia  Type 2 diabetes-carb modified diet once advanced, with moderate dose sliding scale  Permanent atrial fibrillation-monitor on telemetry, hold Eliquis  due to concern for GI bleeding as above  DVT prophylaxis: SCDs only    Code Status: Full Code  Consults called: None  Admission status: The appropriate patient status for this patient is INPATIENT. Inpatient status is judged to be reasonable and necessary in order to provide the required intensity of service to ensure the patient's safety. The patient's presenting symptoms, physical exam findings, and initial  radiographic and laboratory data in the context of their chronic comorbidities is felt to place them at high risk for further clinical deterioration. Furthermore, it is not anticipated that the patient will be medically stable for discharge from the hospital within 2 midnights of admission.    I certify that at the point of admission it is my clinical judgment that the patient will require inpatient hospital care spanning beyond 2 midnights from the point of admission due to high intensity of service, high risk for further deterioration and high frequency of surveillance required  Time spent: 53 minutes  Geremiah Fussell CHRISTELLA Gail MD [facility] Pager [phone]  If 7PM-7AM, please contact night-coverage [email or web address] Password [email or web address]  01/24/2024, 2:52 PM

## 2024-01-25 ENCOUNTER — Inpatient Hospital Stay (HOSPITAL_COMMUNITY)

## 2024-01-25 DIAGNOSIS — E785 Hyperlipidemia, unspecified: Secondary | ICD-10-CM

## 2024-01-25 DIAGNOSIS — K219 Gastro-esophageal reflux disease without esophagitis: Secondary | ICD-10-CM

## 2024-01-25 DIAGNOSIS — D649 Anemia, unspecified: Secondary | ICD-10-CM

## 2024-01-25 DIAGNOSIS — N184 Chronic kidney disease, stage 4 (severe): Secondary | ICD-10-CM | POA: Diagnosis not present

## 2024-01-25 DIAGNOSIS — F32A Depression, unspecified: Secondary | ICD-10-CM

## 2024-01-25 DIAGNOSIS — I5033 Acute on chronic diastolic (congestive) heart failure: Secondary | ICD-10-CM

## 2024-01-25 DIAGNOSIS — E118 Type 2 diabetes mellitus with unspecified complications: Secondary | ICD-10-CM | POA: Diagnosis not present

## 2024-01-25 DIAGNOSIS — E1122 Type 2 diabetes mellitus with diabetic chronic kidney disease: Secondary | ICD-10-CM

## 2024-01-25 DIAGNOSIS — I4819 Other persistent atrial fibrillation: Secondary | ICD-10-CM

## 2024-01-25 DIAGNOSIS — I5032 Chronic diastolic (congestive) heart failure: Secondary | ICD-10-CM

## 2024-01-25 DIAGNOSIS — I739 Peripheral vascular disease, unspecified: Secondary | ICD-10-CM

## 2024-01-25 DIAGNOSIS — D509 Iron deficiency anemia, unspecified: Secondary | ICD-10-CM

## 2024-01-25 DIAGNOSIS — L0390 Cellulitis, unspecified: Secondary | ICD-10-CM

## 2024-01-25 DIAGNOSIS — I10 Essential (primary) hypertension: Secondary | ICD-10-CM

## 2024-01-25 LAB — CBC
HCT: 24.4 % — ABNORMAL LOW (ref 36.0–46.0)
Hemoglobin: 7.3 g/dL — ABNORMAL LOW (ref 12.0–15.0)
MCH: 24.5 pg — ABNORMAL LOW (ref 26.0–34.0)
MCHC: 29.9 g/dL — ABNORMAL LOW (ref 30.0–36.0)
MCV: 81.9 fL (ref 80.0–100.0)
Platelets: 297 K/uL (ref 150–400)
RBC: 2.98 MIL/uL — ABNORMAL LOW (ref 3.87–5.11)
RDW: 20 % — ABNORMAL HIGH (ref 11.5–15.5)
WBC: 14.9 K/uL — ABNORMAL HIGH (ref 4.0–10.5)
nRBC: 0.6 % — ABNORMAL HIGH (ref 0.0–0.2)

## 2024-01-25 LAB — BASIC METABOLIC PANEL WITH GFR
Anion gap: 14 (ref 5–15)
BUN: 68 mg/dL — ABNORMAL HIGH (ref 8–23)
CO2: 20 mmol/L — ABNORMAL LOW (ref 22–32)
Calcium: 9.1 mg/dL (ref 8.9–10.3)
Chloride: 101 mmol/L (ref 98–111)
Creatinine, Ser: 2.11 mg/dL — ABNORMAL HIGH (ref 0.44–1.00)
GFR, Estimated: 24 mL/min — ABNORMAL LOW (ref >60)
Glucose, Bld: 219 mg/dL — ABNORMAL HIGH (ref 70–99)
Potassium: 4 mmol/L (ref 3.5–5.1)
Sodium: 135 mmol/L (ref 135–145)

## 2024-01-25 LAB — IRON AND TIBC
Iron: 271 ug/dL — ABNORMAL HIGH (ref 28–170)
Saturation Ratios: 86 % — ABNORMAL HIGH (ref 10.4–31.8)
TIBC: 316 ug/dL (ref 250–450)
UIBC: 45 ug/dL

## 2024-01-25 LAB — RETICULOCYTES
Immature Retic Fract: 29.7 % — ABNORMAL HIGH (ref 2.3–15.9)
RBC.: 2.99 MIL/uL — ABNORMAL LOW (ref 3.87–5.11)
Retic Count, Absolute: 68.8 K/uL (ref 19.0–186.0)
Retic Ct Pct: 2.3 % (ref 0.4–3.1)

## 2024-01-25 LAB — PREPARE RBC (CROSSMATCH)

## 2024-01-25 LAB — GLUCOSE, CAPILLARY
Glucose-Capillary: 221 mg/dL — ABNORMAL HIGH (ref 70–99)
Glucose-Capillary: 215 mg/dL — ABNORMAL HIGH (ref 70–99)
Glucose-Capillary: 156 mg/dL — ABNORMAL HIGH (ref 70–99)
Glucose-Capillary: 140 mg/dL — ABNORMAL HIGH (ref 70–99)

## 2024-01-25 LAB — VITAMIN B12: Vitamin B-12: 966 pg/mL — ABNORMAL HIGH (ref 180–914)

## 2024-01-25 LAB — HEMOGLOBIN A1C
Hgb A1c MFr Bld: 7 % — ABNORMAL HIGH (ref 4.8–5.6)
Mean Plasma Glucose: 154.2 mg/dL

## 2024-01-25 LAB — PRO BRAIN NATRIURETIC PEPTIDE: Pro Brain Natriuretic Peptide: 3354 pg/mL — ABNORMAL HIGH (ref <300.0)

## 2024-01-25 LAB — FERRITIN: Ferritin: 16 ng/mL (ref 11–307)

## 2024-01-25 LAB — HEMOGLOBIN AND HEMATOCRIT, BLOOD
HCT: 31.6 % — ABNORMAL LOW (ref 36.0–46.0)
Hemoglobin: 9.8 g/dL — ABNORMAL LOW (ref 12.0–15.0)

## 2024-01-25 LAB — FOLATE: Folate: 5.1 ng/mL — ABNORMAL LOW (ref >5.9)

## 2024-01-25 MED ORDER — TORSEMIDE 20 MG PO TABS: 60.0000 mg | ORAL_TABLET | Freq: Every day | ORAL | Status: DC

## 2024-01-25 MED ORDER — FOLIC ACID 1 MG PO TABS
1.0000 mg | ORAL_TABLET | Freq: Every day | ORAL | Status: DC
  Administered 2024-01-25 – 2024-02-05 (×12): 1 mg via ORAL
  Filled 2024-01-25 (×12): qty 1

## 2024-01-25 MED ORDER — HYDROCODONE-ACETAMINOPHEN 5-325 MG PO TABS
1.0000 | ORAL_TABLET | ORAL | Status: DC | PRN
  Administered 2024-01-25 – 2024-01-27 (×11): 1 via ORAL
  Filled 2024-01-25 (×11): qty 1

## 2024-01-25 MED ORDER — ALLOPURINOL 100 MG PO TABS
100.0000 mg | ORAL_TABLET | Freq: Every day | ORAL | Status: DC
  Administered 2024-01-25 – 2024-02-05 (×12): 100 mg via ORAL
  Filled 2024-01-25 (×12): qty 1

## 2024-01-25 MED ORDER — BISOPROLOL FUMARATE 5 MG PO TABS
5.0000 mg | ORAL_TABLET | Freq: Every morning | ORAL | Status: DC
Start: 2024-01-26 — End: 2024-01-25

## 2024-01-25 MED ORDER — FUROSEMIDE 10 MG/ML IJ SOLN
40.0000 mg | Freq: Once | INTRAMUSCULAR | Status: AC
  Administered 2024-01-25: 40 mg via INTRAVENOUS
  Filled 2024-01-25: qty 4

## 2024-01-25 MED ORDER — FUROSEMIDE 10 MG/ML IJ SOLN
20.0000 mg | Freq: Once | INTRAMUSCULAR | Status: AC
  Administered 2024-01-25: 20 mg via INTRAVENOUS
  Filled 2024-01-25: qty 2

## 2024-01-25 MED ORDER — MECLIZINE HCL 25 MG PO TABS: 12.5000 mg | ORAL_TABLET | ORAL | Status: DC | PRN

## 2024-01-25 MED ORDER — BUDESON-GLYCOPYRROL-FORMOTEROL 160-9-4.8 MCG/ACT IN AERO
2.0000 | INHALATION_SPRAY | Freq: Two times a day (BID) | RESPIRATORY_TRACT | Status: DC
  Administered 2024-01-25 – 2024-02-05 (×23): 2 via RESPIRATORY_TRACT
  Filled 2024-01-25 (×3): qty 5.9

## 2024-01-25 MED ORDER — LORATADINE 10 MG PO TABS
10.0000 mg | ORAL_TABLET | Freq: Every day | ORAL | Status: DC
  Administered 2024-01-25 – 2024-02-05 (×12): 10 mg via ORAL
  Filled 2024-01-25 (×12): qty 1

## 2024-01-25 MED ORDER — AMLODIPINE BESYLATE 5 MG PO TABS
5.0000 mg | ORAL_TABLET | Freq: Every morning | ORAL | Status: DC
Start: 2024-01-25 — End: 2024-02-05
  Administered 2024-01-25 – 2024-02-05 (×12): 5 mg via ORAL
  Filled 2024-01-25 (×12): qty 1

## 2024-01-25 MED ORDER — ATORVASTATIN CALCIUM 40 MG PO TABS
80.0000 mg | ORAL_TABLET | Freq: Every day | ORAL | Status: DC
  Administered 2024-01-25 – 2024-02-04 (×11): 80 mg via ORAL
  Filled 2024-01-25 (×11): qty 2

## 2024-01-25 MED ORDER — FERROUS SULFATE 325 (65 FE) MG PO TABS
325.0000 mg | ORAL_TABLET | Freq: Every day | ORAL | Status: DC
Start: 2024-01-26 — End: 2024-01-25

## 2024-01-25 MED ORDER — ACETAMINOPHEN 325 MG PO TABS
650.0000 mg | ORAL_TABLET | Freq: Once | ORAL | Status: AC
  Administered 2024-01-25: 650 mg via ORAL
  Filled 2024-01-25: qty 2

## 2024-01-25 MED ORDER — INSULIN GLARGINE 100 UNIT/ML ~~LOC~~ SOLN
20.0000 [IU] | Freq: Every day | SUBCUTANEOUS | Status: DC
  Administered 2024-01-25 – 2024-01-27 (×3): 20 [IU] via SUBCUTANEOUS
  Filled 2024-01-25 (×4): qty 0.2

## 2024-01-25 MED ORDER — FUROSEMIDE 10 MG/ML IJ SOLN
40.0000 mg | Freq: Two times a day (BID) | INTRAMUSCULAR | Status: DC
  Administered 2024-01-26 – 2024-01-27 (×3): 40 mg via INTRAVENOUS
  Filled 2024-01-25 (×3): qty 4

## 2024-01-25 MED ORDER — TORSEMIDE 20 MG PO TABS
20.0000 mg | ORAL_TABLET | Freq: Every day | ORAL | Status: DC
Start: 2024-01-26 — End: 2024-01-25

## 2024-01-25 MED ORDER — TIZANIDINE HCL 4 MG PO TABS
4.0000 mg | ORAL_TABLET | Freq: Three times a day (TID) | ORAL | Status: DC | PRN
  Administered 2024-01-28 – 2024-02-05 (×4): 4 mg via ORAL
  Filled 2024-01-25 (×4): qty 1

## 2024-01-25 MED ORDER — IPRATROPIUM-ALBUTEROL 0.5-2.5 (3) MG/3ML IN SOLN: 3.0000 mL | RESPIRATORY_TRACT | Status: DC | PRN

## 2024-01-25 MED ORDER — SODIUM CHLORIDE 0.9% IV SOLUTION: Freq: Once | INTRAVENOUS | Status: AC

## 2024-01-25 NOTE — Progress Notes (Addendum)
 PROGRESS NOTE    Paula Wheeler  FMW:[id number] DOB: 1951/04/22 DOA: 01/24/2024 PCP: Okey Carlin Redbird, MD    Chief Complaint  Patient presents with   Skin Problem    Brief Narrative:  Patient 73 year old female history of CKD stage IV, history of prior upper GI bleed, persistent A-fib on Eliquis , type 2 diabetes presented to the hospital with sepsis secondary to bilateral lower extremity cellulitis and also noted to have acute on chronic anemia with hemoglobin as low as 5.2.  PRBC transfusion was ordered.  GI consulted.   Assessment & Plan:   Principal Problem:   Sepsis due to cellulitis [facility]) Active Problems:   Symptomatic anemia   Diabetes mellitus with complication (HCC)   Essential hypertension   HLD (hyperlipidemia)   Persistent atrial fibrillation   PAD (peripheral artery disease)   Type II diabetes mellitus with renal manifestations (HCC)   GERD (gastroesophageal reflux disease)   Depression   CKD (chronic kidney disease) stage 4, GFR 15-29 ml/min (HCC)   Chronic diastolic CHF (congestive heart failure) (HCC)   Pressure injury of skin   Anemia, iron  deficiency   Acute on chronic diastolic congestive heart failure (HCC)  #1 severe sepsis secondary to bilateral lower extremity cellulitis - Patient met criteria for sepsis on presentation due to tachycardia, leukocytosis, initial lactic level of 3.4. - Patient noted to have been on doxycycline  outpatient send in the past however no significant improvement. - Patient given a bolus of IV fluids on admission. -Plain films of the lower extremities negative for any acute fracture however consistent with a cellulitis. -Lower extremity Dopplers negative for DVT. -Blood cultures ordered and pending. - Continue IV vancomycin .   - Supportive care.  2.  Acute on chronic anemia/iron  deficiency anemia/symptomatic anemia/folate deficiency -Patient with a history of persistent A-fib on chronic anticoagulation with Eliquis . -  Patient states was initially on oral iron  replacement however that got discontinued per MD while she was on antibiotics in July and never resumed. - Patient denies any overt GI bleed, no melena, no hematemesis, no hematochezia. - Patient does endorse fatigue for several weeks - Hemoglobin on admission noted at 5.2. - Anemia panel obtained with a iron  level of 271, TIBC of 316, folate of 5.1, ferritin of 16. - Start folic acid  1 mg p.o. daily. - Patient currently on clear liquid diet. - Patient transfused 2 units PRBCs on admission with hemoglobin currently at 7.3. - Transfuse another 2 units PRBCs. - Follow H&H. - Transfusion threshold hemoglobin < 8. - Place on IV PPI. -Continue to hold anticoagulation with Eliquis  - Consult with GI for further evaluation and management.  3.  Persistent A-fib -Currently rate controlled. - Continue to hold Eliquis  for anticoagulation. - GI to advise when Eliquis  may be resumed after workup is completed.  4.  Acute on chronic diastolic CHF -Patient with some bibasilar crackles noted on examination. - Patient with lower extremity edema likely in part secondary to an acute cellulitis. -2D echo (07/18/2023) with a EF of 60 to 65%,NWMA, moderately dilated left atrial size. - Check a proBNP. - Patient noted to be on Demadex  prior to admission currently on hold. - Will place on Lasix  40 mg IV every 12 hours. - Strict I's and O's, daily weights.  5.  Diabetes mellitus type 2 -Hemoglobin A1c 7.0. - Patient noted to be on 70/3030 units in the morning 70 units at bedtime per med rec. - Patient currently on clear liquid diet pending GI evaluation. - CBG  noted at 221 this morning. - Start Lantus  20 units daily. - Continue SSI.  6.  CKD stage IV -Stable. - Currently at baseline.  7.  Hypertension - Resume home regimen Norvasc . - Place patient on IV Lasix  secondary to CHF and hold Demadex  for now.  8.  Hyperlipidemia -Resume home regimen statin.  9.   GERD -IV PPI.  10.  Pressure injury, POA -Wound care consult. Wound 01/24/24 1600 Pressure Injury Heel Right Deep Tissue Pressure Injury - Purple or maroon localized area of discolored intact skin or blood-filled blister due to damage of underlying soft tissue from pressure and/or shear. (Active)     Wound 01/24/24 1700 Pressure Injury Ischial tuberosity Right Stage 2 -  Partial thickness loss of dermis presenting as a shallow open injury with a red, pink wound bed without slough. (Active)       DVT prophylaxis: SCDs Code Status: Full Family Communication: Updated patient.  No family at bedside. Disposition: TBD  Status is: Inpatient Remains inpatient appropriate because: Severity of illness   Consultants:  Gastroenterology pending  Procedures:  Transfusion 2 units PRBCs : 01/24/2024 Chest x-ray 01/24/2024 Plain film of bilateral feet 01/24/2024 Plain films of bilateral tib-fib 01/24/2024 Lower extremity Dopplers 01/25/2024  Antimicrobials:  Anti-infectives (From admission, onward)    Start     Dose/Rate Route Frequency Ordered Stop   01/26/24 1400  vancomycin  (VANCOCIN ) IVPB 1000 mg/200 mL premix        1,000 mg 200 mL/hr over 60 Minutes Intravenous Every 48 hours 01/24/24 1450     01/24/24 1315  vancomycin  (VANCOREADY) IVPB 1750 mg/350 mL        1,750 mg 175 mL/hr over 120 Minutes Intravenous  Once 01/24/24 1305 01/24/24 1548   01/24/24 1315  ceFEPIme  (MAXIPIME ) 2 g in sodium chloride  0.9 % 100 mL IVPB        2 g 200 mL/hr over 30 Minutes Intravenous  Once 01/24/24 1305 01/24/24 1407         Subjective: Patient sitting up in bed.  Patient asking for her breathing treatments.  Patient complaining of lower extremity swelling and difficulty moving left lower extremities.  Fatigue slowly improving.  Denies any chest pain.  Currently receiving transfusion of PRBCs.  Patient asking for her bronchodilators and diuretics to be resumed.   Objective: Vitals:   01/25/24 1216  01/25/24 1219 01/25/24 1336 01/25/24 1431  BP: (!) 143/73  (!) 123/107 130/60  Pulse: 92  78   Resp: 20  18 18   Temp: 98.9 F (37.2 C)  97.8 F (36.6 C) 98.5 F (36.9 C)  TempSrc: Oral  Oral Oral  SpO2: 97% 98% 96% 97%  Weight:      Height:        Intake/Output Summary (Last 24 hours) at 01/25/2024 1601 Last data filed at 01/25/2024 1500 Gross per 24 hour  Intake 1571 ml  Output 1200 ml  Net 371 ml   Filed Weights   01/24/24 1013  Weight: 89.8 kg    Examination:  General exam: Appears calm and comfortable  Respiratory system: Bibasilar crackles.  No wheezing.  Fair air movement.  Speaking in full sentences. Cardiovascular system: Irregularly irregular.  No JVD.  2+ bilateral lower extremity edema L > R.  Gastrointestinal system: Abdomen is nondistended, soft and nontender. No organomegaly or masses felt. Normal bowel sounds heard. Central nervous system: Alert and oriented. No focal neurological deficits. Extremities: Bilateral lower extremities with erythema, some warmth, tenderness to palpation and 2+  edema.  Skin: No rashes, lesions or ulcers Psychiatry: Judgement and insight appear normal. Mood & affect appropriate.     Data Reviewed: I have personally reviewed following labs and imaging studies  CBC: Recent Labs  Lab 01/24/24 1032 01/25/24 0446  WBC 14.9* 14.9*  NEUTROABS 13.7*  --   HGB 5.2* 7.3*  HCT 19.2* 24.4*  MCV 85.7 81.9  PLT 323 297    Basic Metabolic Panel: Recent Labs  Lab 01/24/24 1032 01/25/24 0446  NA 135 135  K 4.1 4.0  CL 98 101  CO2 22 20*  GLUCOSE 309* 219*  BUN 74* 68*  CREATININE 2.34* 2.11*  CALCIUM  9.6 9.1    GFR: Estimated Creatinine Clearance: 26.8 mL/min (A) (by C-G formula based on SCr of 2.11 mg/dL (H)).  Liver Function Tests: Recent Labs  Lab 01/24/24 1032  AST 12*  ALT 13  ALKPHOS 108  BILITOT 0.7  PROT 5.6*  ALBUMIN 3.3*    CBG: Recent Labs  Lab 01/24/24 1616 01/24/24 2128 01/25/24 0747  01/25/24 1126  GLUCAP 280* 349* 221* 215*     Recent Results (from the past 240 hours)  Culture, blood (routine x 2)     Status: None (Preliminary result)   Collection Time: 01/24/24 11:14 AM   Specimen: BLOOD LEFT FOREARM  Result Value Ref Range Status   Specimen Description   Final    BLOOD LEFT FOREARM Performed at [facility], [address] [place], [place] [address]    Special Requests   Final    BOTTLES DRAWN AEROBIC AND ANAEROBIC Blood Culture adequate volume Performed at [facility], [address], [place], [place] [address]    Culture PENDING  Incomplete   Report Status PENDING  Incomplete         Radiology Studies: VAS US  LOWER EXTREMITY VENOUS (DVT) (ONLY [facility] & [facility]) Result Date: 01/25/2024  Lower Venous DVT Study Patient Name:  SHARLETTE JANSMA  Date of Exam:   01/25/2024 Medical Rec #: [id number]          Accession #:    [id number] Date of Birth: 1950/12/18          Patient Gender: F Patient Age:   49 years Exam Location:  [facility] Procedure:      VAS US  LOWER EXTREMITY VENOUS (DVT) Referring Phys: MAUDE MESSICK --------------------------------------------------------------------------------  Indications: Pain, Swelling, Edema, and redness and blisters on both lower extremities. Other Indications: Bilateral lower cellulitis. Comparison Study: 08/03/21 Negative LEV Performing Technologist: Ricka Sturdivant-Jones RDMS, RVT  Examination Guidelines: A complete evaluation includes B-mode imaging, spectral Doppler, color Doppler, and power Doppler as needed of all accessible portions of each vessel. Bilateral testing is considered an integral part of a complete examination. Limited examinations for reoccurring indications may be performed as noted. The reflux portion of the exam is performed with the patient in reverse Trendelenburg.  +---------+---------------+---------+-----------+----------+--------------+ RIGHT     CompressibilityPhasicitySpontaneityPropertiesThrombus Aging +---------+---------------+---------+-----------+----------+--------------+ CFV      Full           Yes      Yes                                 +---------+---------------+---------+-----------+----------+--------------+ SFJ      Full                                                        +---------+---------------+---------+-----------+----------+--------------+  FV Prox  Full                                                        +---------+---------------+---------+-----------+----------+--------------+ FV Mid   Full           Yes      Yes                                 +---------+---------------+---------+-----------+----------+--------------+ FV DistalFull                                                        +---------+---------------+---------+-----------+----------+--------------+ PFV      Full                                                        +---------+---------------+---------+-----------+----------+--------------+ POP      Full           Yes      Yes                                 +---------+---------------+---------+-----------+----------+--------------+ PTV      Full                                                        +---------+---------------+---------+-----------+----------+--------------+ PERO     Full                                                        +---------+---------------+---------+-----------+----------+--------------+   +---------+---------------+---------+-----------+----------+---------------+ LEFT     CompressibilityPhasicitySpontaneityPropertiesThrombus Aging  +---------+---------------+---------+-----------+----------+---------------+ CFV      Full           Yes      Yes                                  +---------+---------------+---------+-----------+----------+---------------+ SFJ      Full                                                          +---------+---------------+---------+-----------+----------+---------------+ FV Prox  Full                                                         +---------+---------------+---------+-----------+----------+---------------+  FV Mid                  Yes      Yes                  Patent by color +---------+---------------+---------+-----------+----------+---------------+ FV Distal               Yes      Yes                  Patent by color +---------+---------------+---------+-----------+----------+---------------+ PFV      Full                                                         +---------+---------------+---------+-----------+----------+---------------+ POP                     Yes      Yes                  Patent by color +---------+---------------+---------+-----------+----------+---------------+ PTV                                                   Patent by color +---------+---------------+---------+-----------+----------+---------------+ PERO                                                  Patent by color +---------+---------------+---------+-----------+----------+---------------+ Left leg was too painful to touch and compress with US  probe. No evidence of DVT with color imaging.  Summary: RIGHT: - There is no evidence of deep vein thrombosis in the lower extremity.  - No cystic structure found in the popliteal fossa. - Ultrasound characteristics of enlarged lymph nodes are noted in the groin.  LEFT: - There is no evidence of deep vein thrombosis in the lower extremity.  - No cystic structure found in the popliteal fossa. - Ultrasound characteristics of enlarged lymph nodes noted in the groin.  *See table(s) above for measurements and observations. Electronically signed by Lonni Gaskins MD on 01/25/2024 at 12:08:49 PM.    Final    DG Chest Port 1 View Result Date: 01/24/2024 EXAM: 1 VIEW(S) XRAY OF THE CHEST 01/24/2024 01:08:00 PM  COMPARISON: 09/27/2023 CLINICAL HISTORY: weakness. Weakness in legs FINDINGS: LUNGS AND PLEURA: No focal pulmonary opacity. No pulmonary edema. There is a small to moderate right pleural effusion and small left effusion. No pneumothorax. Bibasilar atelectasis. HEART AND MEDIASTINUM: Cardiomegaly, unchanged. Aortic calcification. BONES AND SOFT TISSUES: No acute osseous abnormality. IMPRESSION: 1. Small to moderate right pleural effusion and small left pleural effusion. 2. Bibasilar atelectasis. 3. Cardiomegaly, unchanged. 4. Aortic atherosclerosis. Electronically signed by: Waddell Calk MD 01/24/2024 01:29 PM EDT RP Workstation: [id number]   DG Foot Complete Left Result Date: 01/24/2024 CLINICAL DATA:  pain Patient reports bilateral foot blisters after taking doxycycline . Leg swelling and pain. EXAM: LEFT FOOT - COMPLETE 3+ VIEW; RIGHT FOOT COMPLETE - 3+ VIEW; LEFT TIBIA AND FIBULA - 2 VIEW; RIGHT TIBIA AND FIBULA - 2 VIEW COMPARISON:  None Available. FINDINGS: Right lower leg: Mild osseous demineralization.  No evidence of acute fracture, dislocation or bone destruction. There are mild degenerative changes at the right knee and ankle. Mild generalized subcutaneous edema without evidence of foreign body or soft tissue emphysema. Scattered vascular calcifications. Left lower leg: Mild osseous demineralization. No evidence of acute fracture, dislocation or bone destruction. There are mild degenerative changes at the knee and ankle. Generalized subcutaneous edema without evidence of foreign body or soft tissue emphysema. Scattered vascular calcifications. Bilateral feet: The bones are demineralized. There are transverse sclerotic bands traversing the 2nd through 5th metatarsal heads bilaterally, suspicious for chronic stress fractures. No displaced fracture, dislocation or bone destruction identified. There are mild degenerative changes at the 1st metatarsophalangeal joint bilaterally. There are midfoot degenerative  changes with prominent dorsal navicular spurring on the right. Small bidirectional calcaneal spurs, larger on the left. Dorsal forefoot soft tissue swelling bilaterally. No evidence of foreign body or soft tissue emphysema. IMPRESSION: 1. No evidence of acute fracture, dislocation or bone destruction in the lower legs or feet. 2. Transverse sclerotic bands traversing the 2nd through 5th metatarsal heads bilaterally, suspicious for chronic stress fractures. Correlate clinically. 3. Nonspecific soft tissue swelling in both lower legs and feet. No evidence of foreign body or soft tissue emphysema. Electronically Signed   By: Elsie Perone M.D.   On: 01/24/2024 11:41   DG Foot Complete Right Result Date: 01/24/2024 CLINICAL DATA:  pain Patient reports bilateral foot blisters after taking doxycycline . Leg swelling and pain. EXAM: LEFT FOOT - COMPLETE 3+ VIEW; RIGHT FOOT COMPLETE - 3+ VIEW; LEFT TIBIA AND FIBULA - 2 VIEW; RIGHT TIBIA AND FIBULA - 2 VIEW COMPARISON:  None Available. FINDINGS: Right lower leg: Mild osseous demineralization. No evidence of acute fracture, dislocation or bone destruction. There are mild degenerative changes at the right knee and ankle. Mild generalized subcutaneous edema without evidence of foreign body or soft tissue emphysema. Scattered vascular calcifications. Left lower leg: Mild osseous demineralization. No evidence of acute fracture, dislocation or bone destruction. There are mild degenerative changes at the knee and ankle. Generalized subcutaneous edema without evidence of foreign body or soft tissue emphysema. Scattered vascular calcifications. Bilateral feet: The bones are demineralized. There are transverse sclerotic bands traversing the 2nd through 5th metatarsal heads bilaterally, suspicious for chronic stress fractures. No displaced fracture, dislocation or bone destruction identified. There are mild degenerative changes at the 1st metatarsophalangeal joint bilaterally.  There are midfoot degenerative changes with prominent dorsal navicular spurring on the right. Small bidirectional calcaneal spurs, larger on the left. Dorsal forefoot soft tissue swelling bilaterally. No evidence of foreign body or soft tissue emphysema. IMPRESSION: 1. No evidence of acute fracture, dislocation or bone destruction in the lower legs or feet. 2. Transverse sclerotic bands traversing the 2nd through 5th metatarsal heads bilaterally, suspicious for chronic stress fractures. Correlate clinically. 3. Nonspecific soft tissue swelling in both lower legs and feet. No evidence of foreign body or soft tissue emphysema. Electronically Signed   By: Elsie Perone M.D.   On: 01/24/2024 11:41   DG Tibia/Fibula Left Result Date: 01/24/2024 CLINICAL DATA:  pain Patient reports bilateral foot blisters after taking doxycycline . Leg swelling and pain. EXAM: LEFT FOOT - COMPLETE 3+ VIEW; RIGHT FOOT COMPLETE - 3+ VIEW; LEFT TIBIA AND FIBULA - 2 VIEW; RIGHT TIBIA AND FIBULA - 2 VIEW COMPARISON:  None Available. FINDINGS: Right lower leg: Mild osseous demineralization. No evidence of acute fracture, dislocation or bone destruction. There are mild degenerative changes at the right knee and ankle. Mild generalized subcutaneous  edema without evidence of foreign body or soft tissue emphysema. Scattered vascular calcifications. Left lower leg: Mild osseous demineralization. No evidence of acute fracture, dislocation or bone destruction. There are mild degenerative changes at the knee and ankle. Generalized subcutaneous edema without evidence of foreign body or soft tissue emphysema. Scattered vascular calcifications. Bilateral feet: The bones are demineralized. There are transverse sclerotic bands traversing the 2nd through 5th metatarsal heads bilaterally, suspicious for chronic stress fractures. No displaced fracture, dislocation or bone destruction identified. There are mild degenerative changes at the 1st  metatarsophalangeal joint bilaterally. There are midfoot degenerative changes with prominent dorsal navicular spurring on the right. Small bidirectional calcaneal spurs, larger on the left. Dorsal forefoot soft tissue swelling bilaterally. No evidence of foreign body or soft tissue emphysema. IMPRESSION: 1. No evidence of acute fracture, dislocation or bone destruction in the lower legs or feet. 2. Transverse sclerotic bands traversing the 2nd through 5th metatarsal heads bilaterally, suspicious for chronic stress fractures. Correlate clinically. 3. Nonspecific soft tissue swelling in both lower legs and feet. No evidence of foreign body or soft tissue emphysema. Electronically Signed   By: Elsie Perone M.D.   On: 01/24/2024 11:41   DG Tibia/Fibula Right Result Date: 01/24/2024 CLINICAL DATA:  pain Patient reports bilateral foot blisters after taking doxycycline . Leg swelling and pain. EXAM: LEFT FOOT - COMPLETE 3+ VIEW; RIGHT FOOT COMPLETE - 3+ VIEW; LEFT TIBIA AND FIBULA - 2 VIEW; RIGHT TIBIA AND FIBULA - 2 VIEW COMPARISON:  None Available. FINDINGS: Right lower leg: Mild osseous demineralization. No evidence of acute fracture, dislocation or bone destruction. There are mild degenerative changes at the right knee and ankle. Mild generalized subcutaneous edema without evidence of foreign body or soft tissue emphysema. Scattered vascular calcifications. Left lower leg: Mild osseous demineralization. No evidence of acute fracture, dislocation or bone destruction. There are mild degenerative changes at the knee and ankle. Generalized subcutaneous edema without evidence of foreign body or soft tissue emphysema. Scattered vascular calcifications. Bilateral feet: The bones are demineralized. There are transverse sclerotic bands traversing the 2nd through 5th metatarsal heads bilaterally, suspicious for chronic stress fractures. No displaced fracture, dislocation or bone destruction identified. There are mild  degenerative changes at the 1st metatarsophalangeal joint bilaterally. There are midfoot degenerative changes with prominent dorsal navicular spurring on the right. Small bidirectional calcaneal spurs, larger on the left. Dorsal forefoot soft tissue swelling bilaterally. No evidence of foreign body or soft tissue emphysema. IMPRESSION: 1. No evidence of acute fracture, dislocation or bone destruction in the lower legs or feet. 2. Transverse sclerotic bands traversing the 2nd through 5th metatarsal heads bilaterally, suspicious for chronic stress fractures. Correlate clinically. 3. Nonspecific soft tissue swelling in both lower legs and feet. No evidence of foreign body or soft tissue emphysema. Electronically Signed   By: Elsie Perone M.D.   On: 01/24/2024 11:41        Scheduled Meds:  sodium chloride    Intravenous Once   allopurinol   100 mg Oral Daily   amLODipine   5 mg Oral q morning   atorvastatin   80 mg Oral QHS   budesonide -glycopyrrolate -formoterol   2 puff Inhalation BID   folic acid   1 mg Oral Daily   insulin  aspart  0-15 Units Subcutaneous TID WC   insulin  aspart  0-5 Units Subcutaneous QHS   insulin  glargine  20 Units Subcutaneous Daily   loratadine   10 mg Oral Daily   pantoprazole  (PROTONIX ) IV  40 mg Intravenous Q24H   [START ON 01/26/2024]  torsemide   20 mg Oral q1600   [START ON 01/26/2024] torsemide   60 mg Oral Q breakfast   Continuous Infusions:  [START ON 01/26/2024] vancomycin        LOS: 1 day    Time spent: 40 minutes    Toribio Hummer, MD [facility]   To contact the attending provider between 7A-7P or the covering provider during after hours 7P-7A, please log into the web site [email or web address] and access using universal [place] password for that web site. If you do not have the password, please call the hospital operator.  01/25/2024, 4:01 PM

## 2024-01-25 NOTE — Consult Note (Signed)
 Referring Provider: Dr. Zella Primary Care Physician:  Okey Carlin Redbird, MD Primary Gastroenterologist:  Dr. Celestia  Reason for Consultation:  Anemia  HPI: Paula Wheeler is a 73 y.o. female on Eliquis  for Afib with weakness and bilateral lower extremity cellulitis who has a history of melena and s/p EGD in 2018 that showed gastric erosions and colonoscopy in 02/2018 where 2 small adenomas were removed and showed internal hemorrhoids. Denies abdominal pain, N/V, melena, hematochezia, or dizziness. Hgb 5.2 on admit and 7.3 this morning following 2 U PRBCs.  Past Medical History:  Diagnosis Date   Anemia    COPD (chronic obstructive pulmonary disease) (HCC)    Dyslipidemia    Exogenous obesity    GERD (gastroesophageal reflux disease)    GI bleed 05/14/2016   History of abnormal cells from cervix    Hyperlipidemia    Hypertension    Iron  deficiency anemia    Longstanding persistent atrial fibrillation (HCC)    Microalbuminuria    Tobacco dependence    Type II diabetes mellitus (HCC)     Past Surgical History:  Procedure Laterality Date   CARDIOVERSION N/A 08/07/2016   Procedure: CARDIOVERSION;  Surgeon: Aleene JINNY Passe, MD;  Location: [facility] ENDOSCOPY;  Service: Cardiovascular;  Laterality: N/A;   DIAGNOSTIC MAMMOGRAM  05/2015   ESOPHAGOGASTRODUODENOSCOPY N/A 05/15/2016   Procedure: ESOPHAGOGASTRODUODENOSCOPY (EGD);  Surgeon: Lesta JULIANNA Fitz, MD;  Location: [facility] ENDOSCOPY;  Service: Endoscopy;  Laterality: N/A;   EXPLORATORY LAPAROTOMY  1968   I think they left my appendix   PAP SMEAR  2012 AND 2017    Prior to Admission medications   Medication Sig Start Date End Date Taking? Authorizing Provider  acetaminophen  (TYLENOL ) 500 MG tablet Take 500 mg by mouth every 6 (six) hours as needed for mild pain (pain score 1-3) or moderate pain (pain score 4-6).   Yes [provider]  albuterol  (PROVENTIL  HFA;VENTOLIN  HFA) 108 (90 Base) MCG/ACT inhaler Inhale 2 puffs into the  lungs every 4 (four) hours as needed for wheezing or shortness of breath.   Yes [provider]  allopurinol  (ZYLOPRIM ) 100 MG tablet Take 100 mg by mouth in the morning.   Yes [provider]  amLODipine  (NORVASC ) 5 MG tablet Take 5 mg by mouth every morning. 04/26/20  Yes [provider]  apixaban  (ELIQUIS ) 5 MG TABS tablet Take 1 tablet (5 mg total) by mouth 2 (two) times daily. 08/20/23  Yes Nahser, Aleene JINNY, MD  atorvastatin  (LIPITOR) 80 MG tablet Take 1 tablet (80 mg total) by mouth at bedtime. 08/20/23 01/25/24 Yes Nahser, Aleene JINNY, MD  Fluticasone -Umeclidin-Vilant (TRELEGY ELLIPTA ) 100-62.5-25 MCG/ACT AEPB INHALE 1 PUFF INTO THE LUNGS DAILY 12/12/23  Yes Kara Dorn NOVAK, MD  HYDROcodone -acetaminophen  (NORCO/VICODIN) 5-325 MG tablet Take 1 tablet by mouth every 6 (six) hours as needed for moderate pain.   Yes [provider]  insulin  NPH-regular Human (70-30) 100 UNIT/ML injection Inject 6 Units into the skin 2 (two) times daily with a meal. Inject 30 units into the skin in the morning and 70 units into the skin in the evening Patient taking differently: Inject 30-60 Units into the skin 2 (two) times daily with a meal. Inject 30 units into the skin in the morning and 60 units into the skin in the evening 11/29/19  Yes Regalado, Belkys A, MD  ipratropium-albuterol  (DUONEB) 0.5-2.5 (3) MG/3ML SOLN Take 3 mLs by nebulization every 6 (six) hours as needed (SOB). 12/30/23  Yes [provider]  loratadine  (CLARITIN ) 10 MG tablet Take 10 mg by mouth in the morning.   Yes [provider]  metolazone (ZAROXOLYN) 2.5 MG tablet Take 2.5 mg by mouth daily as needed. 07/05/22  Yes [provider]  predniSONE (DELTASONE) 10 MG tablet Take 10 mg by mouth daily as needed (legs).   Yes [provider]  tiZANidine (ZANAFLEX) 4 MG tablet Take 4 mg by mouth as needed for muscle spasms. 07/12/22  Yes [provider]  torsemide  (DEMADEX ) 20 MG  tablet Take 20-60 mg by mouth See admin instructions. 60mg  (3 tablets) in the morning and 20mg  (1 tablet) in the afternoon   Yes [provider]  azithromycin  (ZITHROMAX ) 250 MG tablet Take 1 tablet p.o. daily until completed Patient not taking: Reported on 01/25/2024 10/01/23   Lue Elsie BROCKS, MD  bisoprolol  (ZEBETA ) 5 MG tablet TAKE 1 TABLET(5 MG) BY MOUTH DAILY Patient not taking: Reported on 01/25/2024 07/30/22   Nahser, Aleene PARAS, MD  doxycycline  (VIBRA -TABS) 100 MG tablet Take 1 tablet (100 mg total) by mouth 2 (two) times daily. Patient not taking: Reported on 01/25/2024 11/19/23   Loreda Hacker, DPM  ferrous sulfate  325 (65 FE) MG tablet Take 1 tablet (325 mg total) by mouth daily with breakfast. Patient not taking: Reported on 01/25/2024 11/30/19   Regalado, Belkys A, MD  Insulin  Syringe-Needle U-100 31G X 5/16 1 ML MISC by Does not apply route.    [provider]  meclizine (ANTIVERT) 12.5 MG tablet Take 1-2 tablets by mouth as needed for dizziness or nausea. Patient not taking: Reported on 01/25/2024 07/15/23   [provider]    Scheduled Meds:  sodium chloride    Intravenous Once   allopurinol   100 mg Oral Daily   amLODipine   5 mg Oral q morning   atorvastatin   80 mg Oral QHS   budesonide -glycopyrrolate -formoterol   2 puff Inhalation BID   folic acid   1 mg Oral Daily   furosemide   40 mg Intravenous Once   insulin  aspart  0-15 Units Subcutaneous TID WC   insulin  aspart  0-5 Units Subcutaneous QHS   insulin  glargine  20 Units Subcutaneous Daily   loratadine   10 mg Oral Daily   pantoprazole  (PROTONIX ) IV  40 mg Intravenous Q24H   [START ON 01/26/2024] torsemide   20 mg Oral q1600   [START ON 01/26/2024] torsemide   60 mg Oral Q breakfast   Continuous Infusions:  [START ON 01/26/2024] vancomycin      PRN Meds:.acetaminophen  **OR** acetaminophen , HYDROcodone -acetaminophen , ipratropium-albuterol , ondansetron  **OR** ondansetron  (ZOFRAN ) IV, tiZANidine,  traZODone  Allergies as of 01/24/2024 - Review Complete 01/24/2024  Allergen Reaction Noted   Nsaids Swelling 02/27/2016   Penicillins Hives 02/27/2016   Quinapril  hcl Other (See Comments) 06/20/2021    Family History  Problem Relation Age of Onset   Cancer Mother        BREAST   Hypertension Mother    Macular degeneration Mother    Heart attack Father    Diabetes Father    Colon polyps Brother    Colon polyps Brother    Colon polyps Brother    Colon polyps Brother    Colon polyps Brother    Colon polyps Brother     Social History   Socioeconomic History   Marital status: Single    Spouse name: Not on file   Number of children: Not on file   Years of education: Not on file   Highest education level: Not on file  Occupational History  Not on file  Tobacco Use   Smoking status: Every Day    Current packs/day: 0.50    Average packs/day: 0.5 packs/day for 40.0 years (20.0 ttl pk-yrs)    Types: Cigarettes    Passive exposure: Never   Smokeless tobacco: Never   Tobacco comments:    Still smoking 8-10 cigarettes a day. 10/25/2022 Tay  Vaping Use   Vaping status: Never Used  Substance and Sexual Activity   Alcohol use: Yes    Comment: 05/14/2016 I'll have a drink once/year; New Year's   Drug use: No   Sexual activity: Never  Other Topics Concern   Not on file  Social History Narrative   Not on file   Social Drivers of Health   Financial Resource Strain: Not on file  Food Insecurity: No Food Insecurity (01/24/2024)   Hunger Vital Sign    Worried About Running Out of Food in the Last Year: Never true    Ran Out of Food in the Last Year: Never true  Transportation Needs: No Transportation Needs (01/24/2024)   PRAPARE - Administrator, Civil Service (Medical): No    Lack of Transportation (Non-Medical): No  Physical Activity: Not on file  Stress: Not on file  Social Connections: Socially Isolated (01/24/2024)   Social Connection and Isolation Panel     Frequency of Communication with Friends and Family: More than three times a week    Frequency of Social Gatherings with Friends and Family: More than three times a week    Attends Religious Services: Never    Database administrator or Organizations: No    Attends Banker Meetings: Never    Marital Status: Never married  Intimate Partner Violence: Not At Risk (01/24/2024)   Humiliation, Afraid, Rape, and Kick questionnaire    Fear of Current or Ex-Partner: No    Emotionally Abused: No    Physically Abused: No    Sexually Abused: No    Review of Systems: All negative except as stated above in HPI.  Physical Exam: Vital signs: Vitals:   01/25/24 1219 01/25/24 1336  BP:  (!) 123/107  Pulse:  78  Resp:  18  Temp:  97.8 F (36.6 C)  SpO2: 98% 96%   Last BM Date : 01/23/24 General:  chronically ill-appearing, elderly, obese, no acute distress   Head: normocephalic, atraumatic Eyes: anicteric sclera ENT: oropharynx clear Neck: supple, nontender Lungs:  Clear throughout to auscultation.   No wheezes, crackles, or rhonchi. No acute distress. Heart:  Regular rate and rhythm; no murmurs, clicks, rubs,  or gallops. Abdomen: soft, nontender, nondistended, +BS  Rectal:  Deferred Ext: no edema  GI:  Lab Results: Recent Labs    01/24/24 1032 01/25/24 0446  WBC 14.9* 14.9*  HGB 5.2* 7.3*  HCT 19.2* 24.4*  PLT 323 297   BMET Recent Labs    01/24/24 1032 01/25/24 0446  NA 135 135  K 4.1 4.0  CL 98 101  CO2 22 20*  GLUCOSE 309* 219*  BUN 74* 68*  CREATININE 2.34* 2.11*  CALCIUM  9.6 9.1   LFT Recent Labs    01/24/24 1032  PROT 5.6*  ALBUMIN 3.3*  AST 12*  ALT 13  ALKPHOS 108  BILITOT 0.7   PT/INR No results for input(s): LABPROT, INR in the last 72 hours.   Impression/Plan: Symptomatic anemia without overt bleeding on Eliquis . Doubt GI bleeding source but due to need for restarting Eliquis  will do EGD tomorrow to  further evaluate. If EGD  unrevealing, then do surveillance colonoscopy as outpt. Likely needs hematology referral as well. Clear liquid diet. NPO p MN. Protonix  40 mg IV Q 24. Supportive care. Will follow.    LOS: 1 day   Jerrell JAYSON Sol  01/25/2024, 1:54 PM  Questions please call [phone]

## 2024-01-25 NOTE — Plan of Care (Signed)

## 2024-01-25 NOTE — Progress Notes (Signed)
 Venous duplex lower ext  has been completed. Refer to [facility] under chart review to view preliminary results.   01/25/2024  11:42 AM Reona Zendejas, Ricka BIRCH

## 2024-01-25 NOTE — H&P (View-Only) (Signed)
 Referring Provider: Dr. Zella Primary Care Physician:  Okey Carlin Redbird, MD Primary Gastroenterologist:  Dr. Celestia  Reason for Consultation:  Anemia  HPI: Paula Wheeler is a 73 y.o. female on Eliquis  for Afib with weakness and bilateral lower extremity cellulitis who has a history of melena and s/p EGD in 2018 that showed gastric erosions and colonoscopy in 02/2018 where 2 small adenomas were removed and showed internal hemorrhoids. Denies abdominal pain, N/V, melena, hematochezia, or dizziness. Hgb 5.2 on admit and 7.3 this morning following 2 U PRBCs.  Past Medical History:  Diagnosis Date   Anemia    COPD (chronic obstructive pulmonary disease) (HCC)    Dyslipidemia    Exogenous obesity    GERD (gastroesophageal reflux disease)    GI bleed 05/14/2016   History of abnormal cells from cervix    Hyperlipidemia    Hypertension    Iron  deficiency anemia    Longstanding persistent atrial fibrillation (HCC)    Microalbuminuria    Tobacco dependence    Type II diabetes mellitus (HCC)     Past Surgical History:  Procedure Laterality Date   CARDIOVERSION N/A 08/07/2016   Procedure: CARDIOVERSION;  Surgeon: Aleene JINNY Passe, MD;  Location: [facility] ENDOSCOPY;  Service: Cardiovascular;  Laterality: N/A;   DIAGNOSTIC MAMMOGRAM  05/2015   ESOPHAGOGASTRODUODENOSCOPY N/A 05/15/2016   Procedure: ESOPHAGOGASTRODUODENOSCOPY (EGD);  Surgeon: Lesta JULIANNA Fitz, MD;  Location: [facility] ENDOSCOPY;  Service: Endoscopy;  Laterality: N/A;   EXPLORATORY LAPAROTOMY  1968   I think they left my appendix   PAP SMEAR  2012 AND 2017    Prior to Admission medications   Medication Sig Start Date End Date Taking? Authorizing Provider  acetaminophen  (TYLENOL ) 500 MG tablet Take 500 mg by mouth every 6 (six) hours as needed for mild pain (pain score 1-3) or moderate pain (pain score 4-6).   Yes [provider]  albuterol  (PROVENTIL  HFA;VENTOLIN  HFA) 108 (90 Base) MCG/ACT inhaler Inhale 2 puffs into the  lungs every 4 (four) hours as needed for wheezing or shortness of breath.   Yes [provider]  allopurinol  (ZYLOPRIM ) 100 MG tablet Take 100 mg by mouth in the morning.   Yes [provider]  amLODipine  (NORVASC ) 5 MG tablet Take 5 mg by mouth every morning. 04/26/20  Yes [provider]  apixaban  (ELIQUIS ) 5 MG TABS tablet Take 1 tablet (5 mg total) by mouth 2 (two) times daily. 08/20/23  Yes Nahser, Aleene JINNY, MD  atorvastatin  (LIPITOR) 80 MG tablet Take 1 tablet (80 mg total) by mouth at bedtime. 08/20/23 01/25/24 Yes Nahser, Aleene JINNY, MD  Fluticasone -Umeclidin-Vilant (TRELEGY ELLIPTA ) 100-62.5-25 MCG/ACT AEPB INHALE 1 PUFF INTO THE LUNGS DAILY 12/12/23  Yes Kara Dorn NOVAK, MD  HYDROcodone -acetaminophen  (NORCO/VICODIN) 5-325 MG tablet Take 1 tablet by mouth every 6 (six) hours as needed for moderate pain.   Yes [provider]  insulin  NPH-regular Human (70-30) 100 UNIT/ML injection Inject 6 Units into the skin 2 (two) times daily with a meal. Inject 30 units into the skin in the morning and 70 units into the skin in the evening Patient taking differently: Inject 30-60 Units into the skin 2 (two) times daily with a meal. Inject 30 units into the skin in the morning and 60 units into the skin in the evening 11/29/19  Yes Regalado, Belkys A, MD  ipratropium-albuterol  (DUONEB) 0.5-2.5 (3) MG/3ML SOLN Take 3 mLs by nebulization every 6 (six) hours as needed (SOB). 12/30/23  Yes [provider]  loratadine  (CLARITIN ) 10 MG tablet Take 10 mg by mouth in the morning.   Yes [provider]  metolazone (ZAROXOLYN) 2.5 MG tablet Take 2.5 mg by mouth daily as needed. 07/05/22  Yes [provider]  predniSONE (DELTASONE) 10 MG tablet Take 10 mg by mouth daily as needed (legs).   Yes [provider]  tiZANidine (ZANAFLEX) 4 MG tablet Take 4 mg by mouth as needed for muscle spasms. 07/12/22  Yes [provider]  torsemide  (DEMADEX ) 20 MG  tablet Take 20-60 mg by mouth See admin instructions. 60mg  (3 tablets) in the morning and 20mg  (1 tablet) in the afternoon   Yes [provider]  azithromycin  (ZITHROMAX ) 250 MG tablet Take 1 tablet p.o. daily until completed Patient not taking: Reported on 01/25/2024 10/01/23   Lue Elsie BROCKS, MD  bisoprolol  (ZEBETA ) 5 MG tablet TAKE 1 TABLET(5 MG) BY MOUTH DAILY Patient not taking: Reported on 01/25/2024 07/30/22   Nahser, Aleene PARAS, MD  doxycycline  (VIBRA -TABS) 100 MG tablet Take 1 tablet (100 mg total) by mouth 2 (two) times daily. Patient not taking: Reported on 01/25/2024 11/19/23   Loreda Hacker, DPM  ferrous sulfate  325 (65 FE) MG tablet Take 1 tablet (325 mg total) by mouth daily with breakfast. Patient not taking: Reported on 01/25/2024 11/30/19   Regalado, Belkys A, MD  Insulin  Syringe-Needle U-100 31G X 5/16 1 ML MISC by Does not apply route.    [provider]  meclizine (ANTIVERT) 12.5 MG tablet Take 1-2 tablets by mouth as needed for dizziness or nausea. Patient not taking: Reported on 01/25/2024 07/15/23   [provider]    Scheduled Meds:  sodium chloride    Intravenous Once   allopurinol   100 mg Oral Daily   amLODipine   5 mg Oral q morning   atorvastatin   80 mg Oral QHS   budesonide -glycopyrrolate -formoterol   2 puff Inhalation BID   folic acid   1 mg Oral Daily   furosemide   40 mg Intravenous Once   insulin  aspart  0-15 Units Subcutaneous TID WC   insulin  aspart  0-5 Units Subcutaneous QHS   insulin  glargine  20 Units Subcutaneous Daily   loratadine   10 mg Oral Daily   pantoprazole  (PROTONIX ) IV  40 mg Intravenous Q24H   [START ON 01/26/2024] torsemide   20 mg Oral q1600   [START ON 01/26/2024] torsemide   60 mg Oral Q breakfast   Continuous Infusions:  [START ON 01/26/2024] vancomycin      PRN Meds:.acetaminophen  **OR** acetaminophen , HYDROcodone -acetaminophen , ipratropium-albuterol , ondansetron  **OR** ondansetron  (ZOFRAN ) IV, tiZANidine,  traZODone  Allergies as of 01/24/2024 - Review Complete 01/24/2024  Allergen Reaction Noted   Nsaids Swelling 02/27/2016   Penicillins Hives 02/27/2016   Quinapril  hcl Other (See Comments) 06/20/2021    Family History  Problem Relation Age of Onset   Cancer Mother        BREAST   Hypertension Mother    Macular degeneration Mother    Heart attack Father    Diabetes Father    Colon polyps Brother    Colon polyps Brother    Colon polyps Brother    Colon polyps Brother    Colon polyps Brother    Colon polyps Brother     Social History   Socioeconomic History   Marital status: Single    Spouse name: Not on file   Number of children: Not on file   Years of education: Not on file   Highest education level: Not on file  Occupational History  Not on file  Tobacco Use   Smoking status: Every Day    Current packs/day: 0.50    Average packs/day: 0.5 packs/day for 40.0 years (20.0 ttl pk-yrs)    Types: Cigarettes    Passive exposure: Never   Smokeless tobacco: Never   Tobacco comments:    Still smoking 8-10 cigarettes a day. 10/25/2022 Tay  Vaping Use   Vaping status: Never Used  Substance and Sexual Activity   Alcohol use: Yes    Comment: 05/14/2016 I'll have a drink once/year; New Year's   Drug use: No   Sexual activity: Never  Other Topics Concern   Not on file  Social History Narrative   Not on file   Social Drivers of Health   Financial Resource Strain: Not on file  Food Insecurity: No Food Insecurity (01/24/2024)   Hunger Vital Sign    Worried About Running Out of Food in the Last Year: Never true    Ran Out of Food in the Last Year: Never true  Transportation Needs: No Transportation Needs (01/24/2024)   PRAPARE - Administrator, Civil Service (Medical): No    Lack of Transportation (Non-Medical): No  Physical Activity: Not on file  Stress: Not on file  Social Connections: Socially Isolated (01/24/2024)   Social Connection and Isolation Panel     Frequency of Communication with Friends and Family: More than three times a week    Frequency of Social Gatherings with Friends and Family: More than three times a week    Attends Religious Services: Never    Database administrator or Organizations: No    Attends Banker Meetings: Never    Marital Status: Never married  Intimate Partner Violence: Not At Risk (01/24/2024)   Humiliation, Afraid, Rape, and Kick questionnaire    Fear of Current or Ex-Partner: No    Emotionally Abused: No    Physically Abused: No    Sexually Abused: No    Review of Systems: All negative except as stated above in HPI.  Physical Exam: Vital signs: Vitals:   01/25/24 1219 01/25/24 1336  BP:  (!) 123/107  Pulse:  78  Resp:  18  Temp:  97.8 F (36.6 C)  SpO2: 98% 96%   Last BM Date : 01/23/24 General:  chronically ill-appearing, elderly, obese, no acute distress   Head: normocephalic, atraumatic Eyes: anicteric sclera ENT: oropharynx clear Neck: supple, nontender Lungs:  Clear throughout to auscultation.   No wheezes, crackles, or rhonchi. No acute distress. Heart:  Regular rate and rhythm; no murmurs, clicks, rubs,  or gallops. Abdomen: soft, nontender, nondistended, +BS  Rectal:  Deferred Ext: no edema  GI:  Lab Results: Recent Labs    01/24/24 1032 01/25/24 0446  WBC 14.9* 14.9*  HGB 5.2* 7.3*  HCT 19.2* 24.4*  PLT 323 297   BMET Recent Labs    01/24/24 1032 01/25/24 0446  NA 135 135  K 4.1 4.0  CL 98 101  CO2 22 20*  GLUCOSE 309* 219*  BUN 74* 68*  CREATININE 2.34* 2.11*  CALCIUM  9.6 9.1   LFT Recent Labs    01/24/24 1032  PROT 5.6*  ALBUMIN 3.3*  AST 12*  ALT 13  ALKPHOS 108  BILITOT 0.7   PT/INR No results for input(s): LABPROT, INR in the last 72 hours.   Impression/Plan: Symptomatic anemia without overt bleeding on Eliquis . Doubt GI bleeding source but due to need for restarting Eliquis  will do EGD tomorrow to  further evaluate. If EGD  unrevealing, then do surveillance colonoscopy as outpt. Likely needs hematology referral as well. Clear liquid diet. NPO p MN. Protonix  40 mg IV Q 24. Supportive care. Will follow.    LOS: 1 day   Jerrell JAYSON Sol  01/25/2024, 1:54 PM  Questions please call [phone]

## 2024-01-25 NOTE — Plan of Care (Signed)

## 2024-01-26 ENCOUNTER — Inpatient Hospital Stay (HOSPITAL_COMMUNITY): Admitting: Anesthesiology

## 2024-01-26 ENCOUNTER — Encounter (HOSPITAL_COMMUNITY): Payer: Self-pay | Admitting: Internal Medicine

## 2024-01-26 ENCOUNTER — Encounter (HOSPITAL_COMMUNITY)
Admission: EM | Disposition: A | Discharge status: Skilled Nursing Facility | Payer: Self-pay | Source: Home / Self Care | Attending: Internal Medicine

## 2024-01-26 DIAGNOSIS — L0390 Cellulitis, unspecified: Secondary | ICD-10-CM | POA: Diagnosis not present

## 2024-01-26 DIAGNOSIS — N184 Chronic kidney disease, stage 4 (severe): Secondary | ICD-10-CM | POA: Diagnosis not present

## 2024-01-26 DIAGNOSIS — D649 Anemia, unspecified: Secondary | ICD-10-CM | POA: Diagnosis not present

## 2024-01-26 DIAGNOSIS — E118 Type 2 diabetes mellitus with unspecified complications: Secondary | ICD-10-CM | POA: Diagnosis not present

## 2024-01-26 HISTORY — PX: ESOPHAGOGASTRODUODENOSCOPY: SHX5428

## 2024-01-26 LAB — BPAM RBC
Blood Product Expiration Date: 202510252359
Blood Product Expiration Date: 202509282359
Blood Product Unit Number: 202510252359
Blood Product Unit Number: 202510252359
ISSUE DATE / TIME: 202509282359
ISSUE DATE / TIME: 202509271621
ISSUE DATE / TIME: 202509272239
ISSUING PHYSICIAN: 202509271621
PRODUCT CODE: 202509281154
PRODUCT CODE: 202510252359
Unit Type and Rh: 5100
Unit Type and Rh: 202509280848
Unit Type and Rh: 202509280848
Unit Type and Rh: 5100
Unit Type and Rh: 202509282359
Unit Type and Rh: 5100
Unit Type and Rh: 5100

## 2024-01-26 LAB — BASIC METABOLIC PANEL WITH GFR
Anion gap: 14 (ref 5–15)
BUN: 51 mg/dL — ABNORMAL HIGH (ref 8–23)
CO2: 21 mmol/L — ABNORMAL LOW (ref 22–32)
Calcium: 9.4 mg/dL (ref 8.9–10.3)
Chloride: 100 mmol/L (ref 98–111)
Creatinine, Ser: 2.13 mg/dL — ABNORMAL HIGH (ref 0.44–1.00)
GFR, Estimated: 24 mL/min — ABNORMAL LOW (ref >60)
Glucose, Bld: 168 mg/dL — ABNORMAL HIGH (ref 70–99)
Potassium: 3.8 mmol/L (ref 3.5–5.1)
Sodium: 135 mmol/L (ref 135–145)

## 2024-01-26 LAB — TYPE AND SCREEN
ABO/RH(D): O POS
Antibody Screen: NEGATIVE
Unit division: 0
Unit division: 0
Unit division: 0
Unit division: 0

## 2024-01-26 LAB — GLUCOSE, CAPILLARY
Glucose-Capillary: 142 mg/dL — ABNORMAL HIGH (ref 70–99)
Glucose-Capillary: 111 mg/dL — ABNORMAL HIGH (ref 70–99)
Glucose-Capillary: 126 mg/dL — ABNORMAL HIGH (ref 70–99)
Glucose-Capillary: 108 mg/dL — ABNORMAL HIGH (ref 70–99)

## 2024-01-26 LAB — CBC WITH DIFFERENTIAL/PLATELET
Abs Immature Granulocytes: 0.13 K/uL — ABNORMAL HIGH (ref 0.00–0.07)
Basophils Absolute: 0 K/uL (ref 0.0–0.1)
Basophils Relative: 0 %
Eosinophils Absolute: 0.2 K/uL (ref 0.0–0.5)
Eosinophils Relative: 1 %
HCT: 34.3 % — ABNORMAL LOW (ref 36.0–46.0)
Hemoglobin: 10.5 g/dL — ABNORMAL LOW (ref 12.0–15.0)
Immature Granulocytes: 1 %
Lymphocytes Relative: 6 %
Lymphs Abs: 0.9 K/uL (ref 0.7–4.0)
MCH: 25.5 pg — ABNORMAL LOW (ref 26.0–34.0)
MCHC: 30.6 g/dL (ref 30.0–36.0)
MCV: 83.3 fL (ref 80.0–100.0)
Monocytes Absolute: 1 K/uL (ref 0.1–1.0)
Monocytes Relative: 6 %
Neutro Abs: 12.7 K/uL — ABNORMAL HIGH (ref 1.7–7.7)
Neutrophils Relative %: 86 %
Platelets: 335 K/uL (ref 150–400)
RBC: 4.12 MIL/uL (ref 3.87–5.11)
RDW: 19.9 % — ABNORMAL HIGH (ref 11.5–15.5)
WBC: 14.9 K/uL — ABNORMAL HIGH (ref 4.0–10.5)
nRBC: 0.2 % (ref 0.0–0.2)

## 2024-01-26 LAB — MAGNESIUM: Magnesium: 2.1 mg/dL (ref 1.7–2.4)

## 2024-01-26 SURGERY — EGD (ESOPHAGOGASTRODUODENOSCOPY)
Anesthesia: Monitor Anesthesia Care

## 2024-01-26 MED ORDER — PHENYLEPHRINE 80 MCG/ML (10ML) SYRINGE FOR IV PUSH (FOR BLOOD PRESSURE SUPPORT)
PREFILLED_SYRINGE | INTRAVENOUS | Status: DC | PRN
  Administered 2024-01-26: 120 ug via INTRAVENOUS
  Administered 2024-01-26: 80 ug via INTRAVENOUS
  Administered 2024-01-26: 160 ug via INTRAVENOUS

## 2024-01-26 MED ORDER — SODIUM CHLORIDE 0.9 % IV SOLN: INTRAVENOUS | Status: DC

## 2024-01-26 MED ORDER — PROPOFOL 10 MG/ML IV BOLUS
INTRAVENOUS | Status: DC | PRN
  Administered 2024-01-26 (×2): 50 mg via INTRAVENOUS

## 2024-01-26 MED ORDER — LIDOCAINE 2% (20 MG/ML) 5 ML SYRINGE
INTRAMUSCULAR | Status: DC | PRN
Start: 2024-01-26 — End: 2024-01-26
  Administered 2024-01-26 (×2): 50 mg via INTRAVENOUS

## 2024-01-26 MED ORDER — PANTOPRAZOLE SODIUM 40 MG IV SOLR
40.0000 mg | Freq: Two times a day (BID) | INTRAVENOUS | Status: DC
Start: 2024-01-26 — End: 2024-01-27
  Administered 2024-01-26 – 2024-01-27 (×3): 40 mg via INTRAVENOUS
  Filled 2024-01-26 (×3): qty 10

## 2024-01-26 MED ORDER — SUCRALFATE 1 GM/10ML PO SUSP
1.0000 g | Freq: Three times a day (TID) | ORAL | Status: DC
  Administered 2024-01-26 – 2024-02-05 (×40): 1 g via ORAL
  Filled 2024-01-26 (×39): qty 10

## 2024-01-26 MED ORDER — PROPOFOL 500 MG/50ML IV EMUL
INTRAVENOUS | Status: DC | PRN
  Administered 2024-01-26: 150 ug/kg/min via INTRAVENOUS

## 2024-01-26 NOTE — Plan of Care (Signed)
   Problem: Coping: Goal: Level of anxiety will decrease Outcome: Progressing   Problem: Elimination: Goal: Will not experience complications related to bowel motility Outcome: Progressing   Problem: Safety: Goal: Ability to remain free from injury will improve Outcome: Progressing

## 2024-01-26 NOTE — Interval H&P Note (Signed)
 History and Physical Interval Note:  01/26/2024 12:56 PM  Paula Wheeler  has presented today for surgery, with the diagnosis of Anemia.  The various methods of treatment have been discussed with the patient and family. After consideration of risks, benefits and other options for treatment, the patient has consented to  Procedure(s): EGD (ESOPHAGOGASTRODUODENOSCOPY) (N/A) as a surgical intervention.  The patient's history has been reviewed, patient examined, no change in status, stable for surgery.  I have reviewed the patient's chart and labs.  Questions were answered to the patient's satisfaction.     Jerrell JAYSON Sol

## 2024-01-26 NOTE — Anesthesia Postprocedure Evaluation (Signed)
 Anesthesia Post Note  Patient: Paula Wheeler  Procedure(s) Performed: EGD (ESOPHAGOGASTRODUODENOSCOPY)     Patient location during evaluation: Endoscopy Anesthesia Type: MAC Level of consciousness: awake and alert Pain management: pain level controlled Vital Signs Assessment: post-procedure vital signs reviewed and stable Respiratory status: spontaneous breathing, nonlabored ventilation, respiratory function stable and patient connected to nasal cannula oxygen  Cardiovascular status: blood pressure returned to baseline and stable Postop Assessment: no apparent nausea or vomiting Anesthetic complications: no   No notable events documented.  Last Vitals:  Vitals:   01/26/24 1335 01/26/24 1350  BP: (!) 136/101 132/62  Pulse: 91 73  Resp: (!) 28 18  Temp:    SpO2: 92% 94%    Last Pain:  Vitals:   01/26/24 1350  TempSrc:   PainSc: 0-No pain                 Garnette DELENA Gab

## 2024-01-26 NOTE — Anesthesia Preprocedure Evaluation (Addendum)
 Anesthesia Evaluation  Patient identified by MRN, date of birth, ID band Patient awake    Reviewed: Allergy & Precautions, NPO status , Patient's Chart, lab work & pertinent test results  History of Anesthesia Complications Negative for: history of anesthetic complications  Airway Mallampati: I  TM Distance: >3 FB Neck ROM: Full    Dental no notable dental hx. (+) Edentulous Upper, Dental Advisory Given   Pulmonary neg sleep apnea, COPD,  COPD inhaler   Pulmonary exam normal breath sounds clear to auscultation       Cardiovascular hypertension, (-) angina (-) Past MI Normal cardiovascular exam+ dysrhythmias Atrial Fibrillation  Rhythm:Regular Rate:Normal     Neuro/Psych  PSYCHIATRIC DISORDERS  Depression       GI/Hepatic   Endo/Other  diabetes, Type 2    Renal/GU Renal InsufficiencyRenal diseaseStage IV     Musculoskeletal negative musculoskeletal ROS (+)    Abdominal   Peds  Hematology  (+) Blood dyscrasia, anemia Lab Results      Component                Value               Date                      WBC                      14.9 (H)            01/26/2024                HGB                      10.5 (L)            01/26/2024                HCT                      34.3 (L)            01/26/2024                MCV                      83.3                01/26/2024                PLT                      335                 01/26/2024              Anesthesia Other Findings All NSAIDS pcn Quinapril   Reproductive/Obstetrics negative OB ROS                              Anesthesia Physical Anesthesia Plan  ASA: 2  Anesthesia Plan: MAC   Post-op Pain Management:    Induction:   PONV Risk Score and Plan: Propofol  infusion and Treatment may vary due to age or medical condition  Airway Management Planned: Natural Airway and Nasal Cannula  Additional Equipment: None  Intra-op Plan:    Post-operative Plan:   Informed Consent: I have reviewed the patients History and Physical, chart,  labs and discussed the procedure including the risks, benefits and alternatives for the proposed anesthesia with the patient or authorized representative who has indicated his/her understanding and acceptance.     Dental advisory given  Plan Discussed with: CRNA and Surgeon  Anesthesia Plan Comments: (EGD for Anemia)         Anesthesia Quick Evaluation

## 2024-01-26 NOTE — Evaluation (Signed)
 Physical Therapy Evaluation Patient Details Name: Paula Wheeler MRN: [id number] DOB: 1950/06/17 Today's Date: 01/26/2024  History of Present Illness  73 yo female admitted bil LE pain/blisters. Dx: LE cellulitis, sepsis, acute on chronic HF, anemia s/p transfusion. Hx of CKD, Afib, DM, COPD, anemia, obesity, falls.  Clinical Impression  Limited bed level eval on today. Min A for bed mobility. Pain 10/10 with sitting EOB. Deferred further activity and assisted pt back to bed. Discussed d/c plan-pt is hopeful to return home. She lives alone-needs to be able to ambulate household distances. Will plan to follow and progress activity as tolerated. Will recommend HHPT at this time-will update recommendations as necessary, depending on patient progress.         If plan is discharge home, recommend the following: A lot of help with walking and/or transfers;A lot of help with bathing/dressing/bathroom;Assistance with cooking/housework;Assist for transportation;Help with stairs or ramp for entrance   Can travel by private vehicle        Equipment Recommendations None recommended by PT  Recommendations for Other Services  OT consult    Functional Status Assessment Patient has had a recent decline in their functional status and demonstrates the ability to make significant improvements in function in a reasonable and predictable amount of time.     Precautions / Restrictions Precautions Precautions: Fall Precaution/Restrictions Comments: blisters, wounds LEs Restrictions Weight Bearing Restrictions Per Provider Order: No      Mobility  Bed Mobility Overal bed mobility: Needs Assistance Bed Mobility: Supine to Sit, Sit to Supine     Supine to sit: Min assist, HOB elevated, Used rails Sit to supine: Min assist, HOB elevated, Used rails   General bed mobility comments: Assist for L LE. Increased time. Increased pain with dependent position of LEs.    Transfers                    General transfer comment: Deferred-too painful    Ambulation/Gait                  Stairs            Wheelchair Mobility     Tilt Bed    Modified Rankin (Stroke Patients Only)       Balance Overall balance assessment: Needs assistance Sitting-balance support: Feet supported Sitting balance-Leahy Scale: Good                                       Pertinent Vitals/Pain Pain Assessment Pain Assessment: 0-10 Pain Score: 10-Worst pain ever Pain Location: bil LEs  (7/10 agt rest; 10/10 with activity) Pain Descriptors / Indicators: Grimacing, Guarding, Tender Pain Intervention(s): Limited activity within patient's tolerance, Monitored during session, Repositioned    Home Living Family/patient expects to be discharged to:: Private residence Living Arrangements: Alone Available Help at Discharge: Family Type of Home: House Home Access: Stairs to enter   [employer] of Steps: 1 four inch step   Home Layout: One level Home Equipment: Agricultural consultant (2 wheels);Cane - single point;Tub bench;Toilet riser;Hand held shower head;Adaptive equipment Additional Comments: Sleeps in a recliner    Prior Function Prior Level of Function : Independent/Modified Independent;Working/employed             Mobility Comments: Uses a RW ; can ambulate short community; does not drive; does reports episode of 5 falls but resports was on prednisone and glucose was  not stable - no other falls ADLs Comments: 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 ; able to do ADLs; has been doing [employer] ; brother assist with errands and IADLS, groceries delivered     Extremity/Trunk Assessment   Upper Extremity Assessment Upper Extremity Assessment: Defer to OT evaluation    Lower Extremity Assessment Lower Extremity Assessment: Generalized weakness (redness, warmth, painful bil LEs; also with tenderness, swelling L inner thigh)    Cervical / Trunk  Assessment Cervical / Trunk Assessment: Normal  Communication   Communication Communication: No apparent difficulties    Cognition Arousal: Alert Behavior During Therapy: WFL for tasks assessed/performed   PT - Cognitive impairments: No apparent impairments                         Following commands: Intact       Cueing Cueing Techniques: Verbal cues     General Comments      Exercises General Exercises - Lower Extremity Ankle Circles/Pumps: AROM, Both Quad Sets: AROM, Both   Assessment/Plan    PT Assessment Patient needs continued PT services  PT Problem List Decreased strength;Decreased range of motion;Decreased activity tolerance;Decreased balance;Decreased mobility;Decreased knowledge of use of DME;Pain       PT Treatment Interventions DME instruction;Gait training;Functional mobility training;Therapeutic activities;Therapeutic exercise;Patient/family education    PT Goals (Current goals can be found in the Care Plan section)  Acute Rehab PT Goals Patient Stated Goal: less pain. be able to walk around home. PT Goal Formulation: With patient Time For Goal Achievement: 02/09/24 Potential to Achieve Goals: Good    Frequency Min 3X/week     Co-evaluation               AM-PAC PT 6 Clicks Mobility  Outcome Measure Help needed turning from your back to your side while in a flat bed without using bedrails?: A Little Help needed moving from lying on your back to sitting on the side of a flat bed without using bedrails?: A Little Help needed moving to and from a bed to a chair (including a wheelchair)?: Total Help needed standing up from a chair using your arms (e.g., wheelchair or bedside chair)?: Total Help needed to walk in hospital room?: Total Help needed climbing 3-5 steps with a railing? : Total 6 Click Score: 10    End of Session   Activity Tolerance: Patient limited by pain Patient left: in bed;with call bell/phone within reach;with  bed alarm set   PT Visit Diagnosis: Muscle weakness (generalized) (M62.81);Pain;Other abnormalities of gait and mobility (R26.89);Difficulty in walking, not elsewhere classified (R26.2) Pain - part of body: Leg    Time: 9057-8996 PT Time Calculation (min) (ACUTE ONLY): 21 min   Charges:   PT Evaluation $PT Eval Low Complexity: 1 Low   PT General Charges $$ ACUTE PT VISIT: 1 Visit           Dannial SQUIBB, PT Acute Rehabilitation  Office: [phone]

## 2024-01-26 NOTE — Op Note (Signed)
 [facility] Patient Name: Paula Wheeler Procedure Date: 01/26/2024 MRN: [id number] Attending MD: Jerrell JAYSON Sol , MD, [id number] Date of Birth: [birth year] CSN: [id number] Age: 73 Admit Type: Inpatient Procedure:                Upper GI endoscopy Indications:              Iron  deficiency anemia secondary to chronic blood                            loss Providers:                Jerrell KYM Sol, MD, Darleene Bare, RN, Curtistine Bishop, Technician Referring MD:             hospital team Medicines:                Propofol  per Anesthesia, Monitored Anesthesia Care Complications:            No immediate complications. Estimated Blood Loss:     Estimated blood loss was minimal. Procedure:                Pre-Anesthesia Assessment:                           - Prior to the procedure, a History and Physical                            was performed, and patient medications and                            allergies were reviewed. The patient's tolerance of                            previous anesthesia was also reviewed. The risks                            and benefits of the procedure and the sedation                            options and risks were discussed with the patient.                            All questions were answered, and informed consent                            was obtained. Prior Anticoagulants: The patient has                            taken Eliquis  (apixaban ), last dose was 2 days                            prior to procedure. ASA Grade Assessment: II - A  patient with mild systemic disease. After reviewing                            the risks and benefits, the patient was deemed in                            satisfactory condition to undergo the procedure.                           After obtaining informed consent, the endoscope was                            passed under direct vision. Throughout  the                            procedure, the patient's blood pressure, pulse, and                            oxygen  saturations were monitored continuously. The                            [id number]-[id number] ([id number] Olympus endoscope was introduced                            through the mouth, and advanced to the second part                            of duodenum. The upper GI endoscopy was                            accomplished without difficulty. The patient                            tolerated the procedure well. Scope In: Scope Out: Findings:      LA Grade C (one or more mucosal breaks continuous between tops of 2 or       more mucosal folds, less than 75% circumference) esophagitis with no       bleeding was found in the distal esophagus.      The Z-line was regular and was found 42 cm from the incisors.      Segmental moderate inflammation characterized by congestion (edema) and       erythema was found in the gastric antrum and in the prepyloric region of       the stomach. Biopsies were taken with a cold forceps for histology.       Estimated blood loss was minimal.      The cardia and gastric fundus were normal on retroflexion.      The examined duodenum was normal. Impression:               - LA Grade C reflux esophagitis with no bleeding.                           - Z-line regular, 42 cm from the incisors.                           -  Gastritis. Biopsied.                           - Normal examined duodenum. Moderate Sedation:      N/A - MAC procedure Recommendation:           - Advance diet as tolerated and clear liquid diet.                           - Observe patient's clinical course.                           - Await pathology results.                           - Use sucralfate suspension 1 gram PO QID.                           - Use Protonix  (pantoprazole ) 40 mg IV BID. Procedure Code(s):        --- Professional ---                           [phone],  Esophagogastroduodenoscopy, flexible,                            transoral; with biopsy, single or multiple Diagnosis Code(s):        --- Professional ---                           D50.0, Iron  deficiency anemia secondary to blood                            loss (chronic)                           K29.70, Gastritis, unspecified, without bleeding                           K21.00, Gastro-esophageal reflux disease with                            esophagitis, without bleeding CPT copyright 2022 American Medical Association. All rights reserved. The codes documented in this report are preliminary and upon coder review may  be revised to meet current compliance requirements. Jerrell JAYSON Sol, MD 01/26/2024 1:26:52 PM This report has been signed electronically. Number of Addenda: 0

## 2024-01-26 NOTE — TOC Initial Note (Signed)
 Transition of Care [facility]) - Initial/Assessment Note   Patient Details  Name: Paula Wheeler MRN: [id number] Date of Birth: [birth year]  [facility] CM/SW Contact:    Duwaine GORMAN [name], LCSW Phone Number: 01/26/2024, 12:42 PM  Clinical Narrative: Patient is from home and is currently on IV antibiotics. PT and OT consulted. PT recommended HH; care management awaiting OT recommendation.   Expected Discharge Plan: Home w Home Health Services Barriers to Discharge: Continued Medical Work up  Expected Discharge Plan and Services In-house Referral: Clinical Social Work Post Acute Care Choice: Home Health Living arrangements for the past 2 months: Single Family Home            DME Arranged: N/A DME Agency: NA  Prior Living Arrangements/Services Living arrangements for the past 2 months: Single Family Home Lives with:: Self Patient language and need for interpreter reviewed:: Yes Do you feel safe going back to the place where you live?: Yes      Need for Family Participation in Patient Care: No (Comment) Care giver support system in place?: Yes (comment) Current home services: DME Frieda, [employer], shower chair) Criminal Activity/Legal Involvement Pertinent to Current Situation/Hospitalization: No - Comment as needed  Activities of Daily Living ADL Screening (condition at time of admission) Independently performs ADLs?: Yes (appropriate for developmental age) Is the patient deaf or have difficulty hearing?: No Does the patient have difficulty seeing, even when wearing glasses/contacts?: No Does the patient have difficulty concentrating, remembering, or making decisions?: No  Emotional Assessment Orientation: : Oriented to Self, Oriented to Place, Oriented to  Time, Oriented to Situation Alcohol / Substance Use: Not Applicable Psych Involvement: No (comment)  Admission diagnosis:  Sepsis due to cellulitis (HCC) [L03.90, A41.9] Anemia, unspecified type [D64.9] Cellulitis,  unspecified cellulitis site [L03.90] Patient Active Problem List   Diagnosis Date Noted   Sepsis due to cellulitis (HCC) 01/24/2024   Acute respiratory failure with hypoxia (HCC) 09/27/2023   Anemia, iron  deficiency 09/27/2023   Cardiomegaly 09/27/2023   COPD with acute exacerbation (HCC) 09/27/2023   Mild protein malnutrition 09/27/2023   CAP (community acquired pneumonia) 09/27/2023   Acute on chronic diastolic congestive heart failure (HCC) 09/27/2023   Skin ulcer of toe, unspecified laterality, limited to breakdown of skin (HCC) 08/20/2023   Brain lesion 07/16/2023   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 04/07/2023   Carotid artery disease 08/06/2022   Pain due to onychomycosis of toenails of both feet 05/27/2022   Pressure injury of skin 11/28/2019   Acute renal failure (ARF) 11/23/2019   Lactic acidosis    Hyperkalemia    Acute encephalopathy    Sepsis (HCC)    Shock circulatory (HCC)    Acute renal failure superimposed on stage 4 chronic kidney disease (HCC) 01/29/2018   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   GERD (gastroesophageal reflux disease) 01/29/2018   Depression 01/29/2018   Tobacco abuse 01/29/2018   Elevated troponin 01/29/2018   Leukocytosis 01/29/2018   Chronic diastolic CHF (congestive heart failure) (HCC) 01/29/2018   CKD (chronic kidney disease) stage 4, GFR 15-29 ml/min (HCC)    SOB (shortness of breath)    PAD (peripheral artery disease) 12/23/2017   Persistent atrial fibrillation 06/10/2016   GI bleed 05/14/2016   Symptomatic anemia 05/14/2016   Diabetes mellitus with complication (HCC) 05/14/2016   Essential hypertension 05/14/2016   HLD (hyperlipidemia) 05/14/2016   Anemia    Renal insufficiency    PCP:  Okey Carlin Redbird,  MD Pharmacy:   [facility] [phone] [place], [place] - [address] AT [facility] [address] [facility] [address] & [facility] [address] [place] [address] Phone: [phone] Fax:  [phone]  Social Drivers of Health (SDOH) Social History: SDOH Screenings   Food Insecurity: No Food Insecurity (01/24/2024)  Housing: Low Risk  (01/24/2024)  Transportation Needs: No Transportation Needs (01/24/2024)  Utilities: Not At Risk (01/24/2024)  Social Connections: Socially Isolated (01/24/2024)  Tobacco Use: High Risk (01/26/2024)   SDOH Interventions:    Readmission Risk Interventions    01/26/2024    8:31 AM  Readmission Risk Prevention Plan  Transportation Screening Complete  HRI or Home Care Consult Complete  Social Work Consult for Recovery Care Planning/Counseling Complete  Palliative Care Screening Not Applicable  Medication Review Oceanographer) Complete

## 2024-01-26 NOTE — Progress Notes (Signed)
 PROGRESS NOTE    Paula Wheeler  FMW:[id number] DOB: Apr 22, 1951 DOA: 01/24/2024 PCP: Okey Carlin Redbird, MD    Chief Complaint  Patient presents with   Skin Problem    Brief Narrative:  Patient 73 year old female history of CKD stage IV, history of prior upper GI bleed, persistent A-fib on Eliquis , type 2 diabetes presented to the hospital with sepsis secondary to bilateral lower extremity cellulitis and also noted to have acute on chronic anemia with hemoglobin as low as 5.2.  Status posttransfusion 4 units PRBCs. GI consulted.  Patient scheduled for upper endoscopy 01/26/2024.   Assessment & Plan:   Principal Problem:   Sepsis due to cellulitis [facility]) Active Problems:   Symptomatic anemia   Diabetes mellitus with complication (HCC)   Essential hypertension   HLD (hyperlipidemia)   Persistent atrial fibrillation   PAD (peripheral artery disease)   Type II diabetes mellitus with renal manifestations (HCC)   GERD (gastroesophageal reflux disease)   Depression   CKD (chronic kidney disease) stage 4, GFR 15-29 ml/min (HCC)   Chronic diastolic CHF (congestive heart failure) (HCC)   Pressure injury of skin   Anemia, iron  deficiency   Acute on chronic diastolic congestive heart failure (HCC)  #1 severe sepsis secondary to bilateral lower extremity cellulitis - Patient met criteria for sepsis on presentation due to tachycardia, leukocytosis, initial lactic level of 3.4. - Patient noted to have been on doxycycline  outpatient send in the past however no significant improvement. - Patient given a bolus of IV fluids on admission. -Plain films of the lower extremities negative for any acute fracture however consistent with a cellulitis. -Lower extremity Dopplers negative for DVT. -Blood cultures ordered and pending. - Continue IV vancomycin .   - Supportive care.  2.  Acute on chronic anemia/iron  deficiency anemia/symptomatic anemia/folate deficiency -Patient with a history of  persistent A-fib on chronic anticoagulation with Eliquis . - Patient states was initially on oral iron  replacement however that got discontinued per MD while she was on antibiotics in July and never resumed. - Patient denies any overt GI bleed, no melena, no hematemesis, no hematochezia. - Patient does endorse fatigue for several weeks - Hemoglobin on admission noted at 5.2. - Anemia panel obtained with a iron  level of 271, TIBC of 316, folate of 5.1, ferritin of 16. -Status posttransfusion 4 units PRBCs. -Hemoglobin currently at 10.5 this morning. -Continue folic acid  1 mg daily. - Follow H&H. - Transfusion threshold hemoglobin < 8. - Continue IV PPI. -Continue to hold anticoagulation with Eliquis  - GI consulted and patient for upper endoscopy today for further evaluation.   - Per GI.   3.  Persistent A-fib -Currently rate controlled. - Continue to hold Eliquis  for anticoagulation. - GI to advise when Eliquis  may be resumed after workup is completed.  4.  Acute on chronic diastolic CHF -Patient with some bibasilar crackles noted on examination. - Patient with lower extremity edema likely in part secondary to an acute cellulitis. -2D echo (07/18/2023) with a EF of 60 to 65%,NWMA, moderately dilated left atrial size. - proBNP elevated at 3354.  - Patient noted to be on Demadex  prior to admission currently on hold. - Patient currently on Lasix  40 mg IV every 12 hours with urine output of 2.4 L over the past 24 hours.   - Continue current dose of Lasix  40 mg IV every 12 hours.  - Strict I's and O's, daily weights.  5.  Diabetes mellitus type 2 -Hemoglobin A1c 7.0. - Patient noted to  be on 70/30  30 units in the morning 70 units at bedtime per med rec. -CBG 142 this morning - Patient currently n.p.o. pending endoscopy.  -Continue Lantus  20 units daily. -SSI.  6.  CKD stage IV -Stable. - Currently at baseline.  7.  Hypertension -.  Continue home regimen Norvasc .   - Continue IV  Lasix  secondary to CHF.   8.  Hyperlipidemia - Continue home regimen statin.  9.  GERD - Continue IV PPI.  10.  Pressure injury, POA -Wound care consult. Wound 01/24/24 1600 Pressure Injury Heel Right Deep Tissue Pressure Injury - Purple or maroon localized area of discolored intact skin or blood-filled blister due to damage of underlying soft tissue from pressure and/or shear. (Active)     Wound 01/24/24 1700 Pressure Injury Ischial tuberosity Right Stage 2 -  Partial thickness loss of dermis presenting as a shallow open injury with a red, pink wound bed without slough. (Active)       DVT prophylaxis: SCDs Code Status: Full Family Communication: Updated patient.  No family at bedside. Disposition: TBD  Status is: Inpatient Remains inpatient appropriate because: Severity of illness   Consultants:  Gastroenterology: Dr. Dianna 01/25/2024  Procedures:  Transfusion 4 units PRBCs : Chest x-ray 01/24/2024 Plain film of bilateral feet 01/24/2024 Plain films of bilateral tib-fib 01/24/2024 Lower extremity Dopplers 01/25/2024  Antimicrobials:  Anti-infectives (From admission, onward)    Start     Dose/Rate Route Frequency Ordered Stop   01/26/24 1400  vancomycin  (VANCOCIN ) IVPB 1000 mg/200 mL premix        1,000 mg 200 mL/hr over 60 Minutes Intravenous Every 48 hours 01/24/24 1450     01/24/24 1315  vancomycin  (VANCOREADY) IVPB 1750 mg/350 mL        1,750 mg 175 mL/hr over 120 Minutes Intravenous  Once 01/24/24 1305 01/24/24 1548   01/24/24 1315  ceFEPIme  (MAXIPIME ) 2 g in sodium chloride  0.9 % 100 mL IVPB        2 g 200 mL/hr over 30 Minutes Intravenous  Once 01/24/24 1305 01/24/24 1407         Subjective: Sitting up in bed.  Overall feeling better.  Denies any significant shortness of breath.  No chest pain.  Still with complaints of left lower extremity pain.  Feels swelling has improved.  States she has finished working with therapy.    Objective: Vitals:   01/25/24  1431 01/25/24 2028 01/25/24 2145 01/26/24 0441  BP: 130/60  (!) 155/63 (!) 142/66  Pulse:   79 78  Resp: 18     Temp: 98.5 F (36.9 C)  98 F (36.7 C) 98.7 F (37.1 C)  TempSrc: Oral  Oral Oral  SpO2: 97% 98% 95% 96%  Weight:    91.4 kg  Height:        Intake/Output Summary (Last 24 hours) at 01/26/2024 1058 Last data filed at 01/26/2024 0800 Gross per 24 hour  Intake 938 ml  Output 2050 ml  Net -1112 ml   Filed Weights   01/24/24 1013 01/26/24 0441  Weight: 89.8 kg 91.4 kg    Examination:  General exam: Appears calm and comfortable  Respiratory system: Decreasing bibasilar crackles.  No wheezing.  Fair air movement.  Speaking in full sentences.   Cardiovascular system: Irregularly irregular.  No JVD.  1+ bilateral lower extremity edema.   Gastrointestinal system: Abdomen is soft, nontender, nondistended, positive bowel sounds.  No rebound.  No guarding. Central nervous system: Alert and oriented. No  focal neurological deficits. Extremities: Bilateral lower extremities with decreasing erythema, decreased warmth, less tender to palpation, 1+ edema. Skin: No rashes, lesions or ulcers Psychiatry: Judgement and insight appear normal. Mood & affect appropriate.     Data Reviewed: I have personally reviewed following labs and imaging studies  CBC: Recent Labs  Lab 01/24/24 1032 01/25/24 0446 01/25/24 1649 01/26/24 0501  WBC 14.9* 14.9*  --  14.9*  NEUTROABS 13.7*  --   --  12.7*  HGB 5.2* 7.3* 9.8* 10.5*  HCT 19.2* 24.4* 31.6* 34.3*  MCV 85.7 81.9  --  83.3  PLT 323 297  --  335    Basic Metabolic Panel: Recent Labs  Lab 01/24/24 1032 01/25/24 0446 01/26/24 0501  NA 135 135 135  K 4.1 4.0 3.8  CL 98 101 100  CO2 22 20* 21*  GLUCOSE 309* 219* 168*  BUN 74* 68* 51*  CREATININE 2.34* 2.11* 2.13*  CALCIUM  9.6 9.1 9.4  MG  --   --  2.1    GFR: Estimated Creatinine Clearance: 26.8 mL/min (A) (by C-G formula based on SCr of 2.13 mg/dL (H)).  Liver Function  Tests: Recent Labs  Lab 01/24/24 1032  AST 12*  ALT 13  ALKPHOS 108  BILITOT 0.7  PROT 5.6*  ALBUMIN 3.3*    CBG: Recent Labs  Lab 01/25/24 0747 01/25/24 1126 01/25/24 1719 01/25/24 2146 01/26/24 0730  GLUCAP 221* 215* 156* 140* 142*     Recent Results (from the past 240 hours)  Culture, blood (routine x 2)     Status: None (Preliminary result)   Collection Time: 01/24/24 11:14 AM   Specimen: BLOOD LEFT FOREARM  Result Value Ref Range Status   Specimen Description   Final    BLOOD LEFT FOREARM Performed at [facility], [address], [place], [place] [address]    Special Requests   Final    BOTTLES DRAWN AEROBIC AND ANAEROBIC Blood Culture adequate volume Performed at [facility], [address] [place], [place] [address]    Culture   Final    NO GROWTH 1 DAY Performed at [facility], [address], [place], [place] [address]    Report Status PENDING  Incomplete         Radiology Studies: VAS US  LOWER EXTREMITY VENOUS (DVT) (ONLY [facility] & [facility]) Result Date: 01/25/2024  Lower Venous DVT Study Patient Name:  SHALICIA CRAGHEAD  Date of Exam:   01/25/2024 Medical Rec #: [id number]          Accession #:    [id number] Date of Birth: 10-Sep-1950          Patient Gender: F Patient Age:   30 years Exam Location:  [facility] Procedure:      VAS US  LOWER EXTREMITY VENOUS (DVT) Referring Phys: MAUDE MESSICK --------------------------------------------------------------------------------  Indications: Pain, Swelling, Edema, and redness and blisters on both lower extremities. Other Indications: Bilateral lower cellulitis. Comparison Study: 08/03/21 Negative LEV Performing Technologist: Ricka Sturdivant-Jones RDMS, RVT  Examination Guidelines: A complete evaluation includes B-mode imaging, spectral Doppler, color Doppler, and power Doppler as needed of all accessible portions of each vessel. Bilateral testing is considered an integral part of a  complete examination. Limited examinations for reoccurring indications may be performed as noted. The reflux portion of the exam is performed with the patient in reverse Trendelenburg.  +---------+---------------+---------+-----------+----------+--------------+ RIGHT    CompressibilityPhasicitySpontaneityPropertiesThrombus Aging +---------+---------------+---------+-----------+----------+--------------+ CFV      Full  Yes      Yes                                 +---------+---------------+---------+-----------+----------+--------------+ SFJ      Full                                                        +---------+---------------+---------+-----------+----------+--------------+ FV Prox  Full                                                        +---------+---------------+---------+-----------+----------+--------------+ FV Mid   Full           Yes      Yes                                 +---------+---------------+---------+-----------+----------+--------------+ FV DistalFull                                                        +---------+---------------+---------+-----------+----------+--------------+ PFV      Full                                                        +---------+---------------+---------+-----------+----------+--------------+ POP      Full           Yes      Yes                                 +---------+---------------+---------+-----------+----------+--------------+ PTV      Full                                                        +---------+---------------+---------+-----------+----------+--------------+ PERO     Full                                                        +---------+---------------+---------+-----------+----------+--------------+   +---------+---------------+---------+-----------+----------+---------------+ LEFT     CompressibilityPhasicitySpontaneityPropertiesThrombus Aging   +---------+---------------+---------+-----------+----------+---------------+ CFV      Full           Yes      Yes                                  +---------+---------------+---------+-----------+----------+---------------+ SFJ      Full                                                         +---------+---------------+---------+-----------+----------+---------------+  FV Prox  Full                                                         +---------+---------------+---------+-----------+----------+---------------+ FV Mid                  Yes      Yes                  Patent by color +---------+---------------+---------+-----------+----------+---------------+ FV Distal               Yes      Yes                  Patent by color +---------+---------------+---------+-----------+----------+---------------+ PFV      Full                                                         +---------+---------------+---------+-----------+----------+---------------+ POP                     Yes      Yes                  Patent by color +---------+---------------+---------+-----------+----------+---------------+ PTV                                                   Patent by color +---------+---------------+---------+-----------+----------+---------------+ PERO                                                  Patent by color +---------+---------------+---------+-----------+----------+---------------+ Left leg was too painful to touch and compress with US  probe. No evidence of DVT with color imaging.  Summary: RIGHT: - There is no evidence of deep vein thrombosis in the lower extremity.  - No cystic structure found in the popliteal fossa. - Ultrasound characteristics of enlarged lymph nodes are noted in the groin.  LEFT: - There is no evidence of deep vein thrombosis in the lower extremity.  - No cystic structure found in the popliteal fossa. - Ultrasound characteristics of  enlarged lymph nodes noted in the groin.  *See table(s) above for measurements and observations. Electronically signed by Lonni Gaskins MD on 01/25/2024 at 12:08:49 PM.    Final    DG Chest Port 1 View Result Date: 01/24/2024 EXAM: 1 VIEW(S) XRAY OF THE CHEST 01/24/2024 01:08:00 PM COMPARISON: 09/27/2023 CLINICAL HISTORY: weakness. Weakness in legs FINDINGS: LUNGS AND PLEURA: No focal pulmonary opacity. No pulmonary edema. There is a small to moderate right pleural effusion and small left effusion. No pneumothorax. Bibasilar atelectasis. HEART AND MEDIASTINUM: Cardiomegaly, unchanged. Aortic calcification. BONES AND SOFT TISSUES: No acute osseous abnormality. IMPRESSION: 1. Small to moderate right pleural effusion and small left pleural effusion. 2. Bibasilar atelectasis. 3. Cardiomegaly, unchanged. 4. Aortic atherosclerosis. Electronically signed by: Waddell Calk MD 01/24/2024 01:29 PM EDT RP Workstation: [id number]  DG Foot Complete Left Result Date: 01/24/2024 CLINICAL DATA:  pain Patient reports bilateral foot blisters after taking doxycycline . Leg swelling and pain. EXAM: LEFT FOOT - COMPLETE 3+ VIEW; RIGHT FOOT COMPLETE - 3+ VIEW; LEFT TIBIA AND FIBULA - 2 VIEW; RIGHT TIBIA AND FIBULA - 2 VIEW COMPARISON:  None Available. FINDINGS: Right lower leg: Mild osseous demineralization. No evidence of acute fracture, dislocation or bone destruction. There are mild degenerative changes at the right knee and ankle. Mild generalized subcutaneous edema without evidence of foreign body or soft tissue emphysema. Scattered vascular calcifications. Left lower leg: Mild osseous demineralization. No evidence of acute fracture, dislocation or bone destruction. There are mild degenerative changes at the knee and ankle. Generalized subcutaneous edema without evidence of foreign body or soft tissue emphysema. Scattered vascular calcifications. Bilateral feet: The bones are demineralized. There are transverse sclerotic  bands traversing the 2nd through 5th metatarsal heads bilaterally, suspicious for chronic stress fractures. No displaced fracture, dislocation or bone destruction identified. There are mild degenerative changes at the 1st metatarsophalangeal joint bilaterally. There are midfoot degenerative changes with prominent dorsal navicular spurring on the right. Small bidirectional calcaneal spurs, larger on the left. Dorsal forefoot soft tissue swelling bilaterally. No evidence of foreign body or soft tissue emphysema. IMPRESSION: 1. No evidence of acute fracture, dislocation or bone destruction in the lower legs or feet. 2. Transverse sclerotic bands traversing the 2nd through 5th metatarsal heads bilaterally, suspicious for chronic stress fractures. Correlate clinically. 3. Nonspecific soft tissue swelling in both lower legs and feet. No evidence of foreign body or soft tissue emphysema. Electronically Signed   By: Elsie Perone M.D.   On: 01/24/2024 11:41   DG Foot Complete Right Result Date: 01/24/2024 CLINICAL DATA:  pain Patient reports bilateral foot blisters after taking doxycycline . Leg swelling and pain. EXAM: LEFT FOOT - COMPLETE 3+ VIEW; RIGHT FOOT COMPLETE - 3+ VIEW; LEFT TIBIA AND FIBULA - 2 VIEW; RIGHT TIBIA AND FIBULA - 2 VIEW COMPARISON:  None Available. FINDINGS: Right lower leg: Mild osseous demineralization. No evidence of acute fracture, dislocation or bone destruction. There are mild degenerative changes at the right knee and ankle. Mild generalized subcutaneous edema without evidence of foreign body or soft tissue emphysema. Scattered vascular calcifications. Left lower leg: Mild osseous demineralization. No evidence of acute fracture, dislocation or bone destruction. There are mild degenerative changes at the knee and ankle. Generalized subcutaneous edema without evidence of foreign body or soft tissue emphysema. Scattered vascular calcifications. Bilateral feet: The bones are demineralized.  There are transverse sclerotic bands traversing the 2nd through 5th metatarsal heads bilaterally, suspicious for chronic stress fractures. No displaced fracture, dislocation or bone destruction identified. There are mild degenerative changes at the 1st metatarsophalangeal joint bilaterally. There are midfoot degenerative changes with prominent dorsal navicular spurring on the right. Small bidirectional calcaneal spurs, larger on the left. Dorsal forefoot soft tissue swelling bilaterally. No evidence of foreign body or soft tissue emphysema. IMPRESSION: 1. No evidence of acute fracture, dislocation or bone destruction in the lower legs or feet. 2. Transverse sclerotic bands traversing the 2nd through 5th metatarsal heads bilaterally, suspicious for chronic stress fractures. Correlate clinically. 3. Nonspecific soft tissue swelling in both lower legs and feet. No evidence of foreign body or soft tissue emphysema. Electronically Signed   By: Elsie Perone M.D.   On: 01/24/2024 11:41   DG Tibia/Fibula Left Result Date: 01/24/2024 CLINICAL DATA:  pain Patient reports bilateral foot blisters after taking doxycycline . Leg swelling and pain.  EXAM: LEFT FOOT - COMPLETE 3+ VIEW; RIGHT FOOT COMPLETE - 3+ VIEW; LEFT TIBIA AND FIBULA - 2 VIEW; RIGHT TIBIA AND FIBULA - 2 VIEW COMPARISON:  None Available. FINDINGS: Right lower leg: Mild osseous demineralization. No evidence of acute fracture, dislocation or bone destruction. There are mild degenerative changes at the right knee and ankle. Mild generalized subcutaneous edema without evidence of foreign body or soft tissue emphysema. Scattered vascular calcifications. Left lower leg: Mild osseous demineralization. No evidence of acute fracture, dislocation or bone destruction. There are mild degenerative changes at the knee and ankle. Generalized subcutaneous edema without evidence of foreign body or soft tissue emphysema. Scattered vascular calcifications. Bilateral feet: The  bones are demineralized. There are transverse sclerotic bands traversing the 2nd through 5th metatarsal heads bilaterally, suspicious for chronic stress fractures. No displaced fracture, dislocation or bone destruction identified. There are mild degenerative changes at the 1st metatarsophalangeal joint bilaterally. There are midfoot degenerative changes with prominent dorsal navicular spurring on the right. Small bidirectional calcaneal spurs, larger on the left. Dorsal forefoot soft tissue swelling bilaterally. No evidence of foreign body or soft tissue emphysema. IMPRESSION: 1. No evidence of acute fracture, dislocation or bone destruction in the lower legs or feet. 2. Transverse sclerotic bands traversing the 2nd through 5th metatarsal heads bilaterally, suspicious for chronic stress fractures. Correlate clinically. 3. Nonspecific soft tissue swelling in both lower legs and feet. No evidence of foreign body or soft tissue emphysema. Electronically Signed   By: Elsie Perone M.D.   On: 01/24/2024 11:41   DG Tibia/Fibula Right Result Date: 01/24/2024 CLINICAL DATA:  pain Patient reports bilateral foot blisters after taking doxycycline . Leg swelling and pain. EXAM: LEFT FOOT - COMPLETE 3+ VIEW; RIGHT FOOT COMPLETE - 3+ VIEW; LEFT TIBIA AND FIBULA - 2 VIEW; RIGHT TIBIA AND FIBULA - 2 VIEW COMPARISON:  None Available. FINDINGS: Right lower leg: Mild osseous demineralization. No evidence of acute fracture, dislocation or bone destruction. There are mild degenerative changes at the right knee and ankle. Mild generalized subcutaneous edema without evidence of foreign body or soft tissue emphysema. Scattered vascular calcifications. Left lower leg: Mild osseous demineralization. No evidence of acute fracture, dislocation or bone destruction. There are mild degenerative changes at the knee and ankle. Generalized subcutaneous edema without evidence of foreign body or soft tissue emphysema. Scattered vascular  calcifications. Bilateral feet: The bones are demineralized. There are transverse sclerotic bands traversing the 2nd through 5th metatarsal heads bilaterally, suspicious for chronic stress fractures. No displaced fracture, dislocation or bone destruction identified. There are mild degenerative changes at the 1st metatarsophalangeal joint bilaterally. There are midfoot degenerative changes with prominent dorsal navicular spurring on the right. Small bidirectional calcaneal spurs, larger on the left. Dorsal forefoot soft tissue swelling bilaterally. No evidence of foreign body or soft tissue emphysema. IMPRESSION: 1. No evidence of acute fracture, dislocation or bone destruction in the lower legs or feet. 2. Transverse sclerotic bands traversing the 2nd through 5th metatarsal heads bilaterally, suspicious for chronic stress fractures. Correlate clinically. 3. Nonspecific soft tissue swelling in both lower legs and feet. No evidence of foreign body or soft tissue emphysema. Electronically Signed   By: Elsie Perone M.D.   On: 01/24/2024 11:41        Scheduled Meds:  sodium chloride    Intravenous Once   allopurinol   100 mg Oral Daily   amLODipine   5 mg Oral q morning   atorvastatin   80 mg Oral QHS   budesonide -glycopyrrolate -formoterol   2 puff Inhalation  BID   folic acid   1 mg Oral Daily   furosemide   40 mg Intravenous Q12H   insulin  aspart  0-15 Units Subcutaneous TID WC   insulin  aspart  0-5 Units Subcutaneous QHS   insulin  glargine  20 Units Subcutaneous Daily   loratadine   10 mg Oral Daily   pantoprazole  (PROTONIX ) IV  40 mg Intravenous Q24H   Continuous Infusions:  sodium chloride      vancomycin        LOS: 2 days    Time spent: 40 minutes    Toribio Hummer, MD [facility]   To contact the attending provider between 7A-7P or the covering provider during after hours 7P-7A, please log into the web site [email or web address] and access using universal [place] password for that  web site. If you do not have the password, please call the hospital operator.  01/26/2024, 10:58 AM

## 2024-01-26 NOTE — Plan of Care (Signed)

## 2024-01-26 NOTE — Progress Notes (Signed)
 OT Cancellation Note  Patient Details Name: Paula Wheeler MRN: [id number] DOB: 1951/01/26   Cancelled Treatment:    Reason Eval/Treat Not Completed: Pain limiting ability to participate;Patient at procedure or test/ unavailable Patient recently working with PT, and with significant pain from cellulitis. Patient also with pending EGD procedure this AM. OT will follow back when more medically appropriate.   Ronal Gift E. Nabeeha Badertscher, OTR/L Acute Rehabilitation Services [phone]   Ronal Gift Salt 01/26/2024, 10:12 AM

## 2024-01-26 NOTE — Consult Note (Addendum)
 WOC Nurse Consult Note: Reason for Consult: Requested to assess cellulitis bilateral legs. Wound type: Partial thickness due to cellulitis. DTPI on L heel. Pressure Injury POA: Yes L heel Measurement: 7 cm x 5 cm.  Wound bed: Skin intact, purple dark skin. Drainage (amount, consistency, odor) None Periwound: Intact. Area shows a loose skin that can be open soon due to deep injury. Dressing procedure/placement/frequency: Cleanse with Vashe [id number], not rinse. Pat dry. Apply a single layer of Xeroform to the wound bed daily and cover with foam dressing. Ok to lift and reapply a new Xeroform dressing. The foam can be change every 3 days or PRN.    Anterior side of right leg. Measurement: 5 cm x 3 cm.  Wound bed: 100% red. Drainage (amount, consistency, odor) Scant amount, no odor, serous. Periwound: Intact Dressing procedure/placement/frequency: Cleanse with saline, pat dry. Apply a single layer of Xeroform to the wound bed daily and cover with foam dressing.  Ok to lift and reapply a new Xeroform dressing. The foam can be change every 3 days or PRN.  Dry scabs on toes and 5th metatarsal - Right foot. Leave open air.  Top of right foot - blister 3 x 3 cm, full of purulent exudate. The blister was debrided. Drainage (amount, consistency, odor) Moderate amount, purulent, odor present. Periwound: Intact Dressing procedure/placement/frequency: Cleanse with Vashe [id number], not rinse. Pat dry. Apply a single layer of Xeroform to the wound bed daily and cover with foam dressing. Ok to lift and reapply a new Xeroform dressing. The foam can be change every 3 days or PRN.  Bilateral legs - Dry skin and edema 1+/4+ on both legs. Moist with barrier cream once a day (available in clean utility). Wrap the legs with Kerlix and ACE wrap. Change daily. It starts from the base of toes to the distal patellar notch of the knee.  WOC team will not plan to follow further. Please reconsult if further  assistance is needed. Thank-you,  Lela Holm RN, CNS, [employer], MSN.  (Phone [phone])

## 2024-01-26 NOTE — Transfer of Care (Signed)
 Immediate Anesthesia Transfer of Care Note  Patient: Paula Wheeler  Procedure(s) Performed: EGD (ESOPHAGOGASTRODUODENOSCOPY)  Patient Location: PACU  Anesthesia Type:MAC  Level of Consciousness: awake, oriented, and patient cooperative  Airway & Oxygen  Therapy: Patient Spontanous Breathing  Post-op Assessment: Report given to RN and Post -op Vital signs reviewed and stable  Post vital signs: Reviewed and stable  Last Vitals:  Vitals Value Taken Time  BP 134/71 01/26/24 13:24  Temp    Pulse 91 01/26/24 13:27  Resp 31 01/26/24 13:27  SpO2 95 % on RA 01/26/24 13:27  Vitals shown include unfiled device data.  Last Pain:  Vitals:   01/26/24 1222  TempSrc: (P) Temporal  PainSc: (P) 0-No pain      Patients Stated Pain Goal: (P) 0 (01/26/24 1222)  Complications: No notable events documented.

## 2024-01-27 ENCOUNTER — Inpatient Hospital Stay (HOSPITAL_COMMUNITY)

## 2024-01-27 ENCOUNTER — Encounter (HOSPITAL_COMMUNITY): Payer: Self-pay | Admitting: Gastroenterology

## 2024-01-27 DIAGNOSIS — L0390 Cellulitis, unspecified: Secondary | ICD-10-CM | POA: Diagnosis not present

## 2024-01-27 DIAGNOSIS — D649 Anemia, unspecified: Secondary | ICD-10-CM | POA: Diagnosis not present

## 2024-01-27 DIAGNOSIS — E118 Type 2 diabetes mellitus with unspecified complications: Secondary | ICD-10-CM | POA: Diagnosis not present

## 2024-01-27 DIAGNOSIS — N184 Chronic kidney disease, stage 4 (severe): Secondary | ICD-10-CM | POA: Diagnosis not present

## 2024-01-27 DIAGNOSIS — M79605 Pain in left leg: Secondary | ICD-10-CM

## 2024-01-27 LAB — CBC WITH DIFFERENTIAL/PLATELET
Abs Immature Granulocytes: 0.08 K/uL — ABNORMAL HIGH (ref 0.00–0.07)
Basophils Absolute: 0 K/uL (ref 0.0–0.1)
Basophils Relative: 0 %
Eosinophils Absolute: 0.2 K/uL (ref 0.0–0.5)
Eosinophils Relative: 1 %
HCT: 36.6 % (ref 36.0–46.0)
Hemoglobin: 11.3 g/dL — ABNORMAL LOW (ref 12.0–15.0)
Immature Granulocytes: 1 %
Lymphocytes Relative: 6 %
Lymphs Abs: 0.7 K/uL (ref 0.7–4.0)
MCH: 26 pg (ref 26.0–34.0)
MCHC: 30.9 g/dL (ref 30.0–36.0)
MCV: 84.3 fL (ref 80.0–100.0)
Monocytes Absolute: 1 K/uL (ref 0.1–1.0)
Monocytes Relative: 8 %
Neutro Abs: 10 K/uL — ABNORMAL HIGH (ref 1.7–7.7)
Neutrophils Relative %: 84 %
Platelets: 326 K/uL (ref 150–400)
RBC: 4.34 MIL/uL (ref 3.87–5.11)
RDW: 20.4 % — ABNORMAL HIGH (ref 11.5–15.5)
WBC: 12 K/uL — ABNORMAL HIGH (ref 4.0–10.5)
nRBC: 0 % (ref 0.0–0.2)

## 2024-01-27 LAB — BASIC METABOLIC PANEL WITH GFR
Anion gap: 15 (ref 5–15)
BUN: 49 mg/dL — ABNORMAL HIGH (ref 8–23)
CO2: 22 mmol/L (ref 22–32)
Calcium: 9.5 mg/dL (ref 8.9–10.3)
Chloride: 101 mmol/L (ref 98–111)
Creatinine, Ser: 1.99 mg/dL — ABNORMAL HIGH (ref 0.44–1.00)
GFR, Estimated: 26 mL/min — ABNORMAL LOW (ref >60)
Glucose, Bld: 116 mg/dL — ABNORMAL HIGH (ref 70–99)
Potassium: 3.6 mmol/L (ref 3.5–5.1)
Sodium: 138 mmol/L (ref 135–145)

## 2024-01-27 LAB — GLUCOSE, CAPILLARY
Glucose-Capillary: 114 mg/dL — ABNORMAL HIGH (ref 70–99)
Glucose-Capillary: 247 mg/dL — ABNORMAL HIGH (ref 70–99)
Glucose-Capillary: 140 mg/dL — ABNORMAL HIGH (ref 70–99)
Glucose-Capillary: 138 mg/dL — ABNORMAL HIGH (ref 70–99)

## 2024-01-27 LAB — MAGNESIUM: Magnesium: 2 mg/dL (ref 1.7–2.4)

## 2024-01-27 MED ORDER — POTASSIUM CHLORIDE CRYS ER 10 MEQ PO TBCR
40.0000 meq | EXTENDED_RELEASE_TABLET | Freq: Once | ORAL | Status: AC
  Administered 2024-01-27: 40 meq via ORAL
  Filled 2024-01-27: qty 4

## 2024-01-27 MED ORDER — FUROSEMIDE 10 MG/ML IJ SOLN
40.0000 mg | Freq: Two times a day (BID) | INTRAMUSCULAR | Status: DC
  Administered 2024-01-27 – 2024-01-30 (×6): 40 mg via INTRAVENOUS
  Filled 2024-01-27 (×6): qty 4

## 2024-01-27 MED ORDER — LINEZOLID 600 MG PO TABS
600.0000 mg | ORAL_TABLET | Freq: Two times a day (BID) | ORAL | Status: AC
  Administered 2024-01-28 – 2024-01-30 (×6): 600 mg via ORAL
  Filled 2024-01-27 (×6): qty 1

## 2024-01-27 MED ORDER — PANTOPRAZOLE SODIUM 40 MG PO TBEC
40.0000 mg | DELAYED_RELEASE_TABLET | Freq: Two times a day (BID) | ORAL | Status: DC
  Administered 2024-01-28 – 2024-02-05 (×17): 40 mg via ORAL
  Filled 2024-01-27 (×17): qty 1

## 2024-01-27 MED ORDER — HYDROCODONE-ACETAMINOPHEN 5-325 MG PO TABS
1.0000 | ORAL_TABLET | ORAL | Status: DC | PRN
Start: 2024-01-27 — End: 2024-02-04
  Administered 2024-01-27 – 2024-01-29 (×11): 2 via ORAL
  Administered 2024-01-29: 1 via ORAL
  Administered 2024-01-30 – 2024-01-31 (×6): 2 via ORAL
  Administered 2024-01-31: 1 via ORAL
  Administered 2024-01-31: 2 via ORAL
  Administered 2024-01-31: 1 via ORAL
  Administered 2024-02-01: 2 via ORAL
  Administered 2024-02-01: 1 via ORAL
  Administered 2024-02-01 (×2): 2 via ORAL
  Administered 2024-02-02: 1 via ORAL
  Administered 2024-02-02 – 2024-02-03 (×8): 2 via ORAL
  Administered 2024-02-04: 1 via ORAL
  Administered 2024-02-04: 2 via ORAL
  Filled 2024-01-27 (×3): qty 1
  Filled 2024-01-27 (×7): qty 2
  Filled 2024-01-27: qty 1
  Filled 2024-01-27 (×8): qty 2
  Filled 2024-01-27: qty 1
  Filled 2024-01-27 (×16): qty 2
  Filled 2024-01-27: qty 1
  Filled 2024-01-27: qty 2

## 2024-01-27 MED ORDER — PANTOPRAZOLE SODIUM 40 MG IV SOLR
40.0000 mg | Freq: Two times a day (BID) | INTRAVENOUS | Status: AC
  Administered 2024-01-27: 40 mg via INTRAVENOUS
  Filled 2024-01-27: qty 10

## 2024-01-27 NOTE — Plan of Care (Signed)

## 2024-01-27 NOTE — Progress Notes (Signed)
 PROGRESS NOTE    Paula Wheeler  FMW:[id number] DOB: 11-Feb-1951 DOA: 01/24/2024 PCP: Paula Carlin Redbird, MD    Chief Complaint  Patient presents with   Skin Problem    Brief Narrative:  Patient 73 year old female history of CKD stage IV, history of prior upper GI bleed, persistent A-fib on Eliquis , type 2 diabetes presented to the hospital with sepsis secondary to bilateral lower extremity cellulitis and also noted to have acute on chronic anemia with hemoglobin as low as 5.2.  Status posttransfusion 4 units PRBCs. GI consulted.  Patient status post EGD 01/26/2024 which was consistent with erosive esophagitis and gastritis.  Patient being diuresed for an acute CHF exacerbation and also on antibiotics for cellulitis.    Assessment & Plan:   Principal Problem:   Sepsis due to cellulitis [facility]) Active Problems:   Symptomatic anemia   Diabetes mellitus with complication (HCC)   Essential hypertension   HLD (hyperlipidemia)   Persistent atrial fibrillation   PAD (peripheral artery disease)   Type II diabetes mellitus with renal manifestations (HCC)   GERD (gastroesophageal reflux disease)   Depression   CKD (chronic kidney disease) stage 4, GFR 15-29 ml/min (HCC)   Chronic diastolic CHF (congestive heart failure) (HCC)   Pressure injury of skin   Anemia, iron  deficiency   Acute on chronic diastolic congestive heart failure (HCC)  #1 severe sepsis secondary to bilateral lower extremity cellulitis - Patient met criteria for sepsis on presentation due to tachycardia, leukocytosis, initial lactic level of 3.4. - Patient noted to have been on doxycycline  outpatient send in the past however no significant improvement. - Patient given a bolus of IV fluids on admission. -Plain films of the lower extremities negative for any acute fracture however consistent with a cellulitis. -Lower extremity Dopplers negative for DVT. -Blood cultures ordered and pending. - Patient on IV vancomycin  and  will transition to Zyvox to complete a 7-day course of antibiotic treatment.  - Supportive care.  2.  Acute on chronic anemia/iron  deficiency anemia/symptomatic anemia/folate deficiency/erosive esophagitis and gastritis.  EGD of 01/26/2024 -Patient with a history of persistent A-fib on chronic anticoagulation with Eliquis . - Patient states was initially on oral iron  replacement however that got discontinued per MD while she was on antibiotics in July and never resumed. - Patient denies any overt GI bleed, no melena, no hematemesis, no hematochezia. - Patient does endorse fatigue for several weeks - Hemoglobin on admission noted at 5.2. - Anemia panel obtained with a iron  level of 271, TIBC of 316, folate of 5.1, ferritin of 16. -Status posttransfusion 4 units PRBCs. -Hemoglobin currently at 11.3 this morning. -Continue folic acid  1 mg daily. -Patient seen in consultation by GI and patient underwent upper endoscopy 01/26/2024 which showed LA grade C reflux esophagitis with no bleeding, gastritis which was biopsied, normal examined duodenum. -Continue Carafate slurry 4 times daily, IV PPI every 12 hours and transition to oral PPI tomorrow per GI. -Diet advanced to a soft diet. - Follow H&H. - Transfusion threshold hemoglobin < 8. -Continue to hold anticoagulation with Eliquis  and resume on 01/28/2024 per GI recommendations. -Outpatient follow-up with GI, Dr. Dianna, in 6 to 8 weeks.  3.  Persistent A-fib -Currently rate controlled. - Continue to hold Eliquis  until 01/28/2024 per GI recommendations.   4.  Acute on chronic diastolic CHF -Patient with some bibasilar crackles noted on examination. - Patient with lower extremity edema likely in part secondary to an acute cellulitis. -2D echo (07/18/2023) with a EF of 60  to 65%,NWMA, moderately dilated left atrial size. - proBNP elevated at 3354.  - Patient noted to be on Demadex  prior to admission currently on hold. - Patient currently on Lasix   40 mg IV every 12 hours with urine output of 2 L over the past 24 hours.   - Continue current dose of Lasix  40 mg IV every 12 hours.  -If continued improvement could likely transition back to home regimen Demadex  in the next 24 to 48 hours. - Strict I's and O's, daily weights.  5.  Diabetes mellitus type 2 -Hemoglobin A1c 7.0. - Patient noted to be on 70/30  30 units in the morning 70 units at bedtime per med rec. -CBG 114 this morning - Patient noted to have been on clear liquids and diet advanced to a soft diet today per GI.  -Continue Lantus  20 units daily. -SSI.  6.  Left lower extremity pain -Patient with complaints of left lower extremity pain with difficulty ambulating. - Check plain films of the left femur. - Pain management, supportive care.  7.  CKD stage IV -Stable. - Currently at baseline.  8.  Hypertension -.  Continue home regimen Norvasc .   - Continue IV Lasix  secondary to CHF.   9.  Hyperlipidemia - Statin.   10.  GERD - Continue IV PPI.  11.  Pressure injury, POA -Wound care consult. Wound 01/24/24 1600 Pressure Injury Heel Right Deep Tissue Pressure Injury - Purple or maroon localized area of discolored intact skin or blood-filled blister due to damage of underlying soft tissue from pressure and/or shear. (Active)     Wound 01/24/24 1700 Pressure Injury Ischial tuberosity Right Stage 2 -  Partial thickness loss of dermis presenting as a shallow open injury with a red, pink wound bed without slough. (Active)       DVT prophylaxis: SCDs Code Status: Full Family Communication: Updated patient.  No family at bedside. Disposition: TBD  Status is: Inpatient Remains inpatient appropriate because: Severity of illness   Consultants:  Gastroenterology: Dr. Dianna 01/25/2024  Procedures:  Transfusion 4 units PRBCs : Chest x-ray 01/24/2024 Plain film of bilateral feet 01/24/2024 Plain films of bilateral tib-fib 01/24/2024 Lower extremity Dopplers  01/25/2024  Antimicrobials:  Anti-infectives (From admission, onward)    Start     Dose/Rate Route Frequency Ordered Stop   01/28/24 1000  linezolid (ZYVOX) tablet 600 mg        600 mg Oral Every 12 hours 01/27/24 1040 01/31/24 0959   01/26/24 1400  vancomycin  (VANCOCIN ) IVPB 1000 mg/200 mL premix  Status:  Discontinued        1,000 mg 200 mL/hr over 60 Minutes Intravenous Every 48 hours 01/24/24 1450 01/27/24 1040   01/24/24 1315  vancomycin  (VANCOREADY) IVPB 1750 mg/350 mL        1,750 mg 175 mL/hr over 120 Minutes Intravenous  Once 01/24/24 1305 01/24/24 1548   01/24/24 1315  ceFEPIme  (MAXIPIME ) 2 g in sodium chloride  0.9 % 100 mL IVPB        2 g 200 mL/hr over 30 Minutes Intravenous  Once 01/24/24 1305 01/24/24 1407         Subjective: Patient sitting up in chair.  Denies any chest pain.  Feels shortness of breath has improved.  Denies any abdominal pain.  Feels lower extremity swelling and cellulitis is slowly improving.  Complains of left thigh pain with difficulty ambulating.  States increased dose of pain medication helping with leg pain.  Objective: Vitals:   01/27/24 0518  01/27/24 0728 01/27/24 0825 01/27/24 1403  BP: (!) 146/62 132/62  (!) 142/65  Pulse: 75 86  77  Resp: 16 19  18   Temp: 98 F (36.7 C) 98.1 F (36.7 C)  98 F (36.7 C)  TempSrc:  Oral  Oral  SpO2: 99% 98% 97% 98%  Weight:      Height:        Intake/Output Summary (Last 24 hours) at 01/27/2024 1912 Last data filed at 01/27/2024 1800 Gross per 24 hour  Intake 1080 ml  Output 3050 ml  Net -1970 ml   Filed Weights   01/26/24 0441 01/26/24 1222 01/27/24 0500  Weight: 91.4 kg 89.8 kg 86.9 kg    Examination:  General exam: NAD. Respiratory system: Decreased bibasilar crackles.  No wheezing.  Fair air movement.  Speaking in full sentences.  Cardiovascular system: Irregularly irregular.  No JVD.  1+ bilateral lower extremity edema. Gastrointestinal system: Abdomen is soft, nontender,  nondistended, positive bowel sounds.  No rebound.  No guarding.  Central nervous system: Alert and oriented. No focal neurological deficits. Extremities: Bilateral lower extremities with decreasing erythema, decreased warmth, less tender to palpation, 1+ edema.  Left thigh with some tenderness to palpation. Skin: No rashes, lesions or ulcers Psychiatry: Judgement and insight appear normal. Mood & affect appropriate.     Data Reviewed: I have personally reviewed following labs and imaging studies  CBC: Recent Labs  Lab 01/24/24 1032 01/25/24 0446 01/25/24 1649 01/26/24 0501 01/27/24 0556  WBC 14.9* 14.9*  --  14.9* 12.0*  NEUTROABS 13.7*  --   --  12.7* 10.0*  HGB 5.2* 7.3* 9.8* 10.5* 11.3*  HCT 19.2* 24.4* 31.6* 34.3* 36.6  MCV 85.7 81.9  --  83.3 84.3  PLT 323 297  --  335 326    Basic Metabolic Panel: Recent Labs  Lab 01/24/24 1032 01/25/24 0446 01/26/24 0501 01/27/24 0556  NA 135 135 135 138  K 4.1 4.0 3.8 3.6  CL 98 101 100 101  CO2 22 20* 21* 22  GLUCOSE 309* 219* 168* 116*  BUN 74* 68* 51* 49*  CREATININE 2.34* 2.11* 2.13* 1.99*  CALCIUM  9.6 9.1 9.4 9.5  MG  --   --  2.1 2.0    GFR: Estimated Creatinine Clearance: 27.9 mL/min (A) (by C-G formula based on SCr of 1.99 mg/dL (H)).  Liver Function Tests: Recent Labs  Lab 01/24/24 1032  AST 12*  ALT 13  ALKPHOS 108  BILITOT 0.7  PROT 5.6*  ALBUMIN 3.3*    CBG: Recent Labs  Lab 01/26/24 1617 01/26/24 2114 01/27/24 0747 01/27/24 1143 01/27/24 1607  GLUCAP 126* 108* 114* 247* 140*     Recent Results (from the past 240 hours)  Culture, blood (routine x 2)     Status: None (Preliminary result)   Collection Time: 01/24/24 11:14 AM   Specimen: BLOOD LEFT FOREARM  Result Value Ref Range Status   Specimen Description   Final    BLOOD LEFT FOREARM Performed at [facility], [address], [place], [place] [address]    Special Requests   Final    BOTTLES DRAWN AEROBIC AND ANAEROBIC Blood  Culture adequate volume Performed at [facility], [address], [place], [place] [address]    Culture   Final    NO GROWTH 2 DAYS Performed at [facility], [address] [place], [place] [address]    Report Status PENDING  Incomplete  Radiology Studies: DG FEMUR 1V LEFT Result Date: 01/27/2024 CLINICAL DATA:  Pain. EXAM: LEFT FEMUR 1 VIEW COMPARISON:  None Available. FINDINGS: Divided frontal views of the left femur. No fracture. No evidence of focal bone abnormality or erosion. No hip or knee dislocation. Vascular calcifications are seen. IMPRESSION: No acute findings. Vascular calcifications. Electronically Signed   By: Andrea Gasman M.D.   On: 01/27/2024 17:16        Scheduled Meds:  sodium chloride    Intravenous Once   allopurinol   100 mg Oral Daily   amLODipine   5 mg Oral q morning   atorvastatin   80 mg Oral QHS   budesonide -glycopyrrolate -formoterol   2 puff Inhalation BID   folic acid   1 mg Oral Daily   furosemide   40 mg Intravenous Q12H   insulin  aspart  0-15 Units Subcutaneous TID WC   insulin  aspart  0-5 Units Subcutaneous QHS   insulin  glargine  20 Units Subcutaneous Daily   [START ON 01/28/2024] linezolid  600 mg Oral Q12H   loratadine   10 mg Oral Daily   pantoprazole  (PROTONIX ) IV  40 mg Intravenous Q12H   Followed by   NOREEN ON 01/28/2024] pantoprazole   40 mg Oral BID   sucralfate  1 g Oral TID WC & HS   Continuous Infusions:     LOS: 3 days    Time spent: 40 minutes    Toribio Hummer, MD [facility]   To contact the attending provider between 7A-7P or the covering provider during after hours 7P-7A, please log into the web site [email or web address] and access using universal [place] password for that web site. If you do not have the password, please call the hospital operator.  01/27/2024, 7:12 PM

## 2024-01-27 NOTE — Evaluation (Signed)
 Occupational Therapy Evaluation Patient Details Name: Paula Wheeler MRN: [id number] DOB: 07/20/1950 Today's Date: 01/27/2024   History of Present Illness   73 yo female admitted bil LE pain/blisters. Dx: LE cellulitis, sepsis, acute on chronic HF, anemia s/p transfusion. Hx of CKD, Afib, DM, COPD, anemia, obesity, falls.     Clinical Impressions This 73 yo female admitted with above presents to acute OT with PLOF of being able to manage her own basic ADLs and IADLs but not driving. Currently she is setup-Mod A for basic ADLs and is only able to tolerate bed to recliner transfers stand pivot with RW due to pain in Bil LEs. She will continue to benefit from acute OT with follow up from continued inpatient follow up therapy, <3 hours/day      If plan is discharge home, recommend the following:   A little help with walking and/or transfers;A lot of help with bathing/dressing/bathroom;Assistance with cooking/housework;Assist for transportation;Help with stairs or ramp for entrance     Functional Status Assessment   Patient has had a recent decline in their functional status and demonstrates the ability to make significant improvements in function in a reasonable and predictable amount of time.     Equipment Recommendations   Other (comment) (TBD next venue)      Precautions/Restrictions   Precautions Precautions: Fall Precaution/Restrictions Comments: blisters, wounds LEs Restrictions Weight Bearing Restrictions Per Provider Order: No     Mobility Bed Mobility Overal bed mobility: Needs Assistance Bed Mobility: Supine to Sit     Supine to sit: Min assist, HOB elevated, Used rails     General bed mobility comments: A to scoot to EOB once up sitting, VCs for use of rails with which hands    Transfers Overall transfer level: Needs assistance Equipment used: Rolling walker (2 wheels) Transfers: Sit to/from Stand Sit to Stand: Min assist                   Balance Overall balance assessment: Needs assistance Sitting-balance support: No upper extremity supported, Feet supported Sitting balance-Leahy Scale: Good     Standing balance support: Bilateral upper extremity supported, Reliant on assistive device for balance Standing balance-Leahy Scale: Poor                             ADL either performed or assessed with clinical judgement   ADL Overall ADL's : Needs assistance/impaired Eating/Feeding: Independent;Sitting   Grooming: Set up;Sitting   Upper Body Bathing: Set up;Sitting   Lower Body Bathing: Moderate assistance Lower Body Bathing Details (indicate cue type and reason): min A sit<>stand Upper Body Dressing : Set up;Sitting   Lower Body Dressing: Moderate assistance Lower Body Dressing Details (indicate cue type and reason): min A sit<>stand Toilet Transfer: Minimal assistance;Stand-pivot;Rolling walker (2 wheels) Toilet Transfer Details (indicate cue type and reason): simulated bed>recliner with RW Toileting- Clothing Manipulation and Hygiene: Minimal assistance Toileting - Clothing Manipulation Details (indicate cue type and reason): min A sit<>stand             Vision Baseline Vision/History: 1 Wears glasses Ability to See in Adequate Light: 0 Adequate Patient Visual Report: No change from baseline              Pertinent Vitals/Pain Pain Assessment Pain Location: started at 7/10 in feet at rest and up to 10/10 with bed to recliner transfer Pain Descriptors / Indicators: Grimacing, Guarding, Tender, Throbbing, Aching Pain Intervention(s): Limited activity within  patient's tolerance, Monitored during session, Premedicated before session, Repositioned     Extremity/Trunk Assessment Upper Extremity Assessment Upper Extremity Assessment: Overall WFL for tasks assessed           Communication Communication Communication: No apparent difficulties   Cognition Arousal: Alert Behavior During  Therapy: WFL for tasks assessed/performed Cognition: No apparent impairments                               Following commands: Intact       Cueing   Cueing Techniques: Verbal cues              Home Living Family/patient expects to be discharged to:: Private residence Living Arrangements: Alone Available Help at Discharge: Family;Available PRN/intermittently Type of Home: House Home Access: Stairs to enter Entrance Stairs-Number of Steps: 1 four inch step   Home Layout: One level     Bathroom Shower/Tub: Tub/shower unit;Curtain   Bathroom Toilet: Handicapped height     Home Equipment: Agricultural consultant (2 wheels);Cane - single point;Tub bench;Toilet riser;Hand held shower head;Adaptive equipment Adaptive Equipment: Reacher Additional Comments: Sleeps in a recliner      Prior Functioning/Environment Prior Level of Function : Independent/Modified Independent;Working/employed             Mobility Comments: Uses a RW ; can ambulate short community; does not drive; does reports episode of 5 falls but resports was on prednisone and glucose was not stable - no other falls ADLs Comments: Works from home in [employer] ; able to do ADLs; has been doing [employer] ; brother assist with errands and IADLS, groceries delivered    OT Problem List: Decreased strength;Decreased activity tolerance;Impaired balance (sitting and/or standing);Pain   OT Treatment/Interventions: Self-care/ADL training;DME and/or AE instruction;Balance training;Patient/family education      OT Goals(Current goals can be found in the care plan section)   Acute Rehab OT Goals Patient Stated Goal: for pain to better in my legs so I can walk OT Goal Formulation: With patient Time For Goal Achievement: 02/10/24 Potential to Achieve Goals: Good   OT Frequency:  Min 2X/week       AM-PAC OT 6 Clicks Daily Activity     Outcome Measure Help from another person eating meals?:  None Help from another person taking care of personal grooming?: A Little Help from another person toileting, which includes using toliet, bedpan, or urinal?: A Lot Help from another person bathing (including washing, rinsing, drying)?: A Lot Help from another person to put on and taking off regular upper body clothing?: A Little Help from another person to put on and taking off regular lower body clothing?: A Lot 6 Click Score: 16   End of Session Equipment Utilized During Treatment: Gait belt;Rolling walker (2 wheels) Nurse Communication: Mobility status (via secure chat)  Activity Tolerance: Patient limited by pain Patient left: in chair;with call bell/phone within reach;with chair alarm set  OT Visit Diagnosis: Unsteadiness on feet (R26.81);Other abnormalities of gait and mobility (R26.89);Pain;Muscle weakness (generalized) (M62.81) Pain - Right/Left:  (Bil legs (R>L)) Pain - part of body: Leg                Time: 9093-9065 OT Time Calculation (min): 28 min Charges:  OT General Charges $OT Visit: 1 Visit OT Evaluation $OT Eval Moderate Complexity: 1 Mod OT Treatments $Self Care/Home Management : 8-22 mins  Donny BECKER OT Acute Rehabilitation Services Office [phone]    Wheeler Paula Distel  01/27/2024, 10:00 AM

## 2024-01-27 NOTE — Progress Notes (Signed)
 PT Cancellation Note  Patient Details Name: Paula Wheeler MRN: [id number] DOB: 10/23/1950   Cancelled Treatment:    Reason Eval/Treat Not Completed:  Attempted PT tx session to update recommendation-pt declined participation at this time. Will check back on tomorrow.    Dannial SQUIBB, PT Acute Rehabilitation  Office: [phone]

## 2024-01-27 NOTE — Progress Notes (Signed)
 [facility] Gastroenterology Progress Note  KRISHAWNA STIEFEL 73 y.o. 1950-05-13   Subjective: Denies abdominal pain. Hungry.  Objective: Vital signs: Vitals:   01/27/24 0518 01/27/24 0728  BP: (!) 146/62 132/62  Pulse: 75 86  Resp: 16 19  Temp: 98 F (36.7 C) 98.1 F (36.7 C)  SpO2: 99% 98%    Physical Exam: Gen: alert, no acute distress, well-nourished, pleasant  HEENT: anicteric sclera CV: RRR Chest: CTA B Abd: soft, nontender, nondistended, +BS  Lab Results: Recent Labs    01/26/24 0501 01/27/24 0556  NA 135 138  K 3.8 3.6  CL 100 101  CO2 21* 22  GLUCOSE 168* 116*  BUN 51* 49*  CREATININE 2.13* 1.99*  CALCIUM  9.4 9.5  MG 2.1 2.0   Recent Labs    01/24/24 1032  AST 12*  ALT 13  ALKPHOS 108  BILITOT 0.7  PROT 5.6*  ALBUMIN 3.3*   Recent Labs    01/26/24 0501 01/27/24 0556  WBC 14.9* 12.0*  NEUTROABS 12.7* 10.0*  HGB 10.5* 11.3*  HCT 34.3* 36.6  MCV 83.3 84.3  PLT 335 326      Assessment/Plan: Symptomatic anemia without overt bleeding - S/P EGD with erosive esophagitis and gastritis. Continue Carafate. Change to PO PPI tomorrow. Advance diet. Path pending. Will sign off. Call if questions. F/U with me in 6-8 weeks.   Jerrell JAYSON Sol 01/27/2024, 7:37 AM  Questions please call [phone] ID: Almarie DELENA Ada, female   DOB: [birth year], 73 y.o.   MRN: [id number]

## 2024-01-28 ENCOUNTER — Inpatient Hospital Stay (HOSPITAL_COMMUNITY)

## 2024-01-28 DIAGNOSIS — A419 Sepsis, unspecified organism: Secondary | ICD-10-CM | POA: Diagnosis not present

## 2024-01-28 DIAGNOSIS — L0390 Cellulitis, unspecified: Secondary | ICD-10-CM | POA: Diagnosis not present

## 2024-01-28 LAB — BASIC METABOLIC PANEL WITH GFR
Anion gap: 13 (ref 5–15)
BUN: 47 mg/dL — ABNORMAL HIGH (ref 8–23)
CO2: 26 mmol/L (ref 22–32)
Calcium: 9.2 mg/dL (ref 8.9–10.3)
Chloride: 101 mmol/L (ref 98–111)
Creatinine, Ser: 2.19 mg/dL — ABNORMAL HIGH (ref 0.44–1.00)
GFR, Estimated: 23 mL/min — ABNORMAL LOW (ref >60)
Glucose, Bld: 126 mg/dL — ABNORMAL HIGH (ref 70–99)
Potassium: 3.8 mmol/L (ref 3.5–5.1)
Sodium: 139 mmol/L (ref 135–145)

## 2024-01-28 LAB — GLUCOSE, CAPILLARY
Glucose-Capillary: 155 mg/dL — ABNORMAL HIGH (ref 70–99)
Glucose-Capillary: 68 mg/dL — ABNORMAL LOW (ref 70–99)
Glucose-Capillary: 196 mg/dL — ABNORMAL HIGH (ref 70–99)
Glucose-Capillary: 226 mg/dL — ABNORMAL HIGH (ref 70–99)
Glucose-Capillary: 228 mg/dL — ABNORMAL HIGH (ref 70–99)

## 2024-01-28 LAB — CBC WITH DIFFERENTIAL/PLATELET
Abs Immature Granulocytes: 0.05 K/uL (ref 0.00–0.07)
Basophils Absolute: 0 K/uL (ref 0.0–0.1)
Basophils Relative: 0 %
Eosinophils Absolute: 0.2 K/uL (ref 0.0–0.5)
Eosinophils Relative: 2 %
HCT: 33.7 % — ABNORMAL LOW (ref 36.0–46.0)
Hemoglobin: 10.4 g/dL — ABNORMAL LOW (ref 12.0–15.0)
Immature Granulocytes: 1 %
Lymphocytes Relative: 8 %
Lymphs Abs: 0.7 K/uL (ref 0.7–4.0)
MCH: 26.3 pg (ref 26.0–34.0)
MCHC: 30.9 g/dL (ref 30.0–36.0)
MCV: 85.3 fL (ref 80.0–100.0)
Monocytes Absolute: 1.1 K/uL — ABNORMAL HIGH (ref 0.1–1.0)
Monocytes Relative: 12 %
Neutro Abs: 7.3 K/uL (ref 1.7–7.7)
Neutrophils Relative %: 77 %
Platelets: 296 K/uL (ref 150–400)
RBC: 3.95 MIL/uL (ref 3.87–5.11)
RDW: 20.3 % — ABNORMAL HIGH (ref 11.5–15.5)
WBC: 9.3 K/uL (ref 4.0–10.5)
nRBC: 0 % (ref 0.0–0.2)

## 2024-01-28 LAB — MAGNESIUM: Magnesium: 1.8 mg/dL (ref 1.7–2.4)

## 2024-01-28 LAB — SURGICAL PATHOLOGY

## 2024-01-28 MED ORDER — APIXABAN 5 MG PO TABS
5.0000 mg | ORAL_TABLET | Freq: Two times a day (BID) | ORAL | Status: DC
  Administered 2024-01-28 – 2024-02-05 (×17): 5 mg via ORAL
  Filled 2024-01-28 (×17): qty 1

## 2024-01-28 MED ORDER — DICLOFENAC SODIUM 1 % EX GEL
2.0000 g | Freq: Four times a day (QID) | CUTANEOUS | Status: DC
  Administered 2024-01-28 – 2024-02-05 (×27): 2 g via TOPICAL
  Filled 2024-01-28: qty 100

## 2024-01-28 MED ORDER — GERHARDT'S BUTT CREAM
TOPICAL_CREAM | Freq: Two times a day (BID) | CUTANEOUS | Status: DC
  Administered 2024-02-02: 1 via TOPICAL
  Filled 2024-01-28: qty 60

## 2024-01-28 NOTE — Progress Notes (Signed)
 Hypoglycemic Event  CBG: 68  Treatment: 4 oz juice/soda  Symptoms: None  Follow-up CBG: Time:0750 CBG Result:155  Possible Reasons for Event: Medication regimen: lantus   Comments/MD notified:MD notified    Paula Wheeler

## 2024-01-28 NOTE — Progress Notes (Signed)
 PROGRESS NOTE    Paula Wheeler  FMW:[id number] DOB: 1950-08-07 DOA: 01/24/2024 PCP: Okey Carlin Redbird, MD    Chief Complaint  Patient presents with   Skin Problem    Brief Narrative:  Patient 73 year old female history of CKD stage IV, history of prior upper GI bleed, persistent A-fib on Eliquis , type 2 diabetes presented to the hospital with sepsis secondary to bilateral lower extremity cellulitis and also noted to have acute on chronic anemia with hemoglobin as low as 5.2.  Status posttransfusion 4 units PRBCs. GI consulted.  Patient status post EGD 01/26/2024 which was consistent with erosive esophagitis and gastritis.  Patient being diuresed for an acute CHF exacerbation and also on antibiotics for cellulitis.    Assessment & Plan:   Principal Problem:   Sepsis due to cellulitis [facility]) Active Problems:   Symptomatic anemia   Diabetes mellitus with complication (HCC)   Essential hypertension   HLD (hyperlipidemia)   Persistent atrial fibrillation   PAD (peripheral artery disease)   Type II diabetes mellitus with renal manifestations (HCC)   GERD (gastroesophageal reflux disease)   Depression   CKD (chronic kidney disease) stage 4, GFR 15-29 ml/min (HCC)   Chronic diastolic CHF (congestive heart failure) (HCC)   Pressure injury of skin   Anemia, iron  deficiency   Acute on chronic diastolic congestive heart failure (HCC)   severe sepsis secondary to bilateral lower extremity cellulitis - Patient met criteria for sepsis on presentation due to tachycardia, leukocytosis, initial lactic level of 3.4. - Patient noted to have been on doxycycline  outpatient send in the past however no significant improvement. - Patient given a bolus of IV fluids on admission. -Plain films of the lower extremities negative for any acute fracture however consistent with a cellulitis. -Lower extremity Dopplers negative for DVT. -Blood cultures ordered and pending. - Patient on IV vancomycin  and  will transition to Zyvox to complete a 7-day course of antibiotic treatment.  - Supportive care.   Acute on chronic anemia/iron  deficiency anemia/symptomatic anemia/folate deficiency/erosive esophagitis and gastritis.  EGD of 01/26/2024 -Patient with a history of persistent A-fib on chronic anticoagulation with Eliquis . - Patient states was initially on oral iron  replacement however that got discontinued per MD while she was on antibiotics in July and never resumed. - Patient denies any overt GI bleed, no melena, no hematemesis, no hematochezia. - Patient does endorse fatigue for several weeks - Hemoglobin on admission noted at 5.2. - Anemia panel obtained with a iron  level of 271, TIBC of 316, folate of 5.1, ferritin of 16. -Status posttransfusion 4 units PRBCs. -Hemoglobin currently at 11.3 this morning. -Continue folic acid  1 mg daily. -Patient seen in consultation by GI and patient underwent upper endoscopy 01/26/2024 which showed LA grade C reflux esophagitis with no bleeding, gastritis which was biopsied, normal examined duodenum. -Continue Carafate slurry 4 times daily, IV PPI every 12 hours and transition to oral PPI tomorrow per GI. -Diet advanced to a soft diet. - Follow H&H. - Transfusion threshold hemoglobin < 8. - resume Eliquis  on 01/28/2024 per GI recommendations. -Outpatient follow-up with GI, Dr. Dianna, in 6 to 8 weeks.  Persistent A-fib -Currently rate controlled. - Per Dr. Sebastian: Continue to hold Eliquis  until 01/28/2024 as these were GI recommendations.   Acute on chronic diastolic CHF -Patient with some bibasilar crackles noted on examination. - Patient with lower extremity edema likely in part secondary to an acute cellulitis. -2D echo (07/18/2023) with a EF of 60 to 65%,NWMA, moderately dilated left  atrial size. - proBNP elevated at 3354.  - Patient noted to be on Demadex  prior to admission currently on hold. - Patient currently on Lasix  40 mg IV every 12 hours  with urine output of 2 L over the past 24 hours.   - Continue current dose of Lasix  40 mg IV every 12 hours.  -If continued improvement could likely transition back to home regimen Demadex  in the next 24 to 48 hours. - Strict I's and O's, daily weights.   Diabetes mellitus type 2 -Hemoglobin A1c 7.0. -SSI only for now due to hypoglycemia   Left lower extremity pain -Patient with complaints of left lower extremity pain with difficulty ambulating. - X-ray unrevealing - Check CT scan - Has already had duplex of lower extremities without any clot seen   CKD stage IV -Stable. - Currently at baseline.   Hypertension -.  Continue home regimen Norvasc .   - Continue IV Lasix  secondary to CHF.   Hyperlipidemia - Statin.     GERD - Continue IV PPI.   Pressure injury, POA -Wound care: Wound 01/24/24 1600 Pressure Injury Heel Right Deep Tissue Pressure Injury - Purple or maroon localized area of discolored intact skin or blood-filled blister due to damage of underlying soft tissue from pressure and/or shear. (Active)     Wound 01/24/24 1700 Pressure Injury Ischial tuberosity Right Stage 2 -  Partial thickness loss of dermis presenting as a shallow open injury with a red, pink wound bed without slough. (Active)       DVT prophylaxis: SCDs Code Status: Full Family Communication: Updated patient.  No family at bedside. Disposition: TBD  Status is: Inpatient Remains inpatient appropriate because: Severity of illness   Consultants:  Gastroenterology: Dr. Dianna 01/25/2024  Procedures:  Transfusion 4 units PRBCs : Chest x-ray 01/24/2024 Plain film of bilateral feet 01/24/2024 Plain films of bilateral tib-fib 01/24/2024 Lower extremity Dopplers 01/25/2024    Subjective: Continues to complain of left hip/thigh pain  Objective: Vitals:   01/27/24 1403 01/27/24 1936 01/28/24 0525 01/28/24 0751  BP: (!) 142/65 129/70 (!) 149/65   Pulse: 77 76 86   Resp: 18 18 18    Temp: 98 F  (36.7 C) 98.9 F (37.2 C) 98.6 F (37 C)   TempSrc: Oral Oral Oral   SpO2: 98% 94% 95% 95%  Weight:      Height:        Intake/Output Summary (Last 24 hours) at 01/28/2024 1159 Last data filed at 01/28/2024 1100 Gross per 24 hour  Intake 840 ml  Output 2450 ml  Net -1610 ml   Filed Weights   01/26/24 0441 01/26/24 1222 01/27/24 0500  Weight: 91.4 kg 89.8 kg 86.9 kg    Examination:   General: Appearance:    Obese female in no acute distress     Lungs:     Clear to auscultation bilaterally, respirations unlabored  Heart:    Normal heart rate. irr   MS:   All extremities are intact.   Neurologic:   Awake, alert, oriented x 3. No apparent focal neurological           defect.        Data Reviewed: I have personally reviewed following labs and imaging studies  CBC: Recent Labs  Lab 01/24/24 1032 01/25/24 0446 01/25/24 1649 01/26/24 0501 01/27/24 0556 01/28/24 0502  WBC 14.9* 14.9*  --  14.9* 12.0* 9.3  NEUTROABS 13.7*  --   --  12.7* 10.0* 7.3  HGB 5.2* 7.3*  9.8* 10.5* 11.3* 10.4*  HCT 19.2* 24.4* 31.6* 34.3* 36.6 33.7*  MCV 85.7 81.9  --  83.3 84.3 85.3  PLT 323 297  --  335 326 296    Basic Metabolic Panel: Recent Labs  Lab 01/24/24 1032 01/25/24 0446 01/26/24 0501 01/27/24 0556 01/28/24 0502  NA 135 135 135 138 139  K 4.1 4.0 3.8 3.6 3.8  CL 98 101 100 101 101  CO2 22 20* 21* 22 26  GLUCOSE 309* 219* 168* 116* 126*  BUN 74* 68* 51* 49* 47*  CREATININE 2.34* 2.11* 2.13* 1.99* 2.19*  CALCIUM  9.6 9.1 9.4 9.5 9.2  MG  --   --  2.1 2.0 1.8    GFR: Estimated Creatinine Clearance: 25.4 mL/min (A) (by C-G formula based on SCr of 2.19 mg/dL (H)).  Liver Function Tests: Recent Labs  Lab 01/24/24 1032  AST 12*  ALT 13  ALKPHOS 108  BILITOT 0.7  PROT 5.6*  ALBUMIN 3.3*    CBG: Recent Labs  Lab 01/27/24 1607 01/27/24 2005 01/28/24 0730 01/28/24 0759 01/28/24 1147  GLUCAP 140* 138* 68* 155* 196*     Recent Results (from the past 240  hours)  Culture, blood (routine x 2)     Status: None (Preliminary result)   Collection Time: 01/24/24 11:14 AM   Specimen: BLOOD LEFT FOREARM  Result Value Ref Range Status   Specimen Description   Final    BLOOD LEFT FOREARM Performed at [facility], [address] [place], [place] [address]    Special Requests   Final    BOTTLES DRAWN AEROBIC AND ANAEROBIC Blood Culture adequate volume Performed at [facility], [address] [place], [place] [address]    Culture   Final    NO GROWTH 3 DAYS Performed at [facility], [address], [place], [place] [address]    Report Status PENDING  Incomplete  Culture, blood (routine x 2)     Status: None (Preliminary result)   Collection Time: 01/24/24 11:35 AM   Specimen: BLOOD  Result Value Ref Range Status   Specimen Description   Final    BLOOD RIGHT ANTECUBITAL Performed at [facility], [address] [place], [place] [address]    Special Requests   Final    BOTTLES DRAWN AEROBIC AND ANAEROBIC Blood Culture results may not be optimal due to an inadequate volume of blood received in culture bottles Performed at [facility], [address], [place], [place] [address]    Culture   Final    NO GROWTH 4 DAYS Performed at [facility], [address] [place], [place] [address]    Report Status PENDING  Incomplete         Radiology Studies: DG FEMUR 1V LEFT Result Date: 01/27/2024 CLINICAL DATA:  Pain. EXAM: LEFT FEMUR 1 VIEW COMPARISON:  None Available. FINDINGS: Divided frontal views of the left femur. No fracture. No evidence of focal bone abnormality or erosion. No hip or knee dislocation. Vascular calcifications are seen. IMPRESSION: No acute findings. Vascular calcifications. Electronically Signed   By: Andrea Gasman M.D.   On: 01/27/2024 17:16        Scheduled Meds:  allopurinol   100 mg Oral Daily   amLODipine   5 mg Oral q morning    atorvastatin   80 mg Oral QHS   budesonide -glycopyrrolate -formoterol   2 puff Inhalation BID   diclofenac  Sodium  2 g Topical QID  folic acid   1 mg Oral Daily   furosemide   40 mg Intravenous Q12H   Gerhardt's butt cream   Topical BID   insulin  aspart  0-15 Units Subcutaneous TID WC   insulin  aspart  0-5 Units Subcutaneous QHS   linezolid  600 mg Oral Q12H   loratadine   10 mg Oral Daily   pantoprazole   40 mg Oral BID   sucralfate  1 g Oral TID WC & HS   Continuous Infusions:     LOS: 4 days    Time spent: 40 minutes    Harlene RAYMOND Bowl, DO [facility]   To contact the attending provider between 7A-7P or the covering provider during after hours 7P-7A, please log into the web site [email or web address] and access using universal [place] password for that web site. If you do not have the password, please call the hospital operator.  01/28/2024, 11:59 AM

## 2024-01-28 NOTE — Progress Notes (Signed)
 Daily dressing to BLE not changed. Pt refused and states dressings were already changed by AM nurse about 1500.

## 2024-01-28 NOTE — NC FL2 (Signed)
 [place]  MEDICAID FL2 LEVEL OF CARE FORM     IDENTIFICATION  Patient Name: Paula Wheeler Birthdate: 09-24-1950 Sex: female Admission Date (Current Location): 01/24/2024  [facility] and [place] Number:  Producer, television/film/video and Address:  [facility],  [address], [place] [address]      Provider Number: [id number]  Attending Physician Name and Address:  Juvenal Harlene PENNER, DO  Relative Name and Phone Number:  [name] Gorczyca([phone]    Current Level of Care: Hospital Recommended Level of Care:   Prior Approval Number:    Date Approved/Denied:   PASRR Number: [id number]  Discharge Plan: SNF    Current Diagnoses: Patient Active Problem List   Diagnosis Date Noted   Sepsis due to cellulitis (HCC) 01/24/2024   Acute respiratory failure with hypoxia (HCC) 09/27/2023   Anemia, iron  deficiency 09/27/2023   Cardiomegaly 09/27/2023   COPD with acute exacerbation (HCC) 09/27/2023   Mild protein malnutrition 09/27/2023   CAP (community acquired pneumonia) 09/27/2023   Acute on chronic diastolic congestive heart failure (HCC) 09/27/2023   Skin ulcer of toe, unspecified laterality, limited to breakdown of skin (HCC) 08/20/2023   Brain lesion 07/16/2023   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 04/07/2023   Carotid artery disease 08/06/2022   Pain due to onychomycosis of toenails of both feet 05/27/2022   Pressure injury of skin 11/28/2019   Acute renal failure (ARF) 11/23/2019   Lactic acidosis    Hyperkalemia    Acute encephalopathy    Sepsis (HCC)    Shock circulatory (HCC)    Acute renal failure superimposed on stage 4 chronic kidney disease (HCC) 01/29/2018   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   GERD (gastroesophageal reflux disease) 01/29/2018   Depression 01/29/2018   Tobacco abuse 01/29/2018   Elevated troponin 01/29/2018   Leukocytosis 01/29/2018   Chronic diastolic CHF (congestive heart failure) (HCC)  01/29/2018   CKD (chronic kidney disease) stage 4, GFR 15-29 ml/min (HCC)    SOB (shortness of breath)    PAD (peripheral artery disease) 12/23/2017   Persistent atrial fibrillation 06/10/2016   GI bleed 05/14/2016   Symptomatic anemia 05/14/2016   Diabetes mellitus with complication (HCC) 05/14/2016   Essential hypertension 05/14/2016   HLD (hyperlipidemia) 05/14/2016   Anemia    Renal insufficiency     Orientation RESPIRATION BLADDER Height & Weight     Self, Time, Situation, Place  Normal Continent Weight: 86.9 kg Height:  5' 6 (167.6 cm)  BEHAVIORAL SYMPTOMS/MOOD NEUROLOGICAL BOWEL NUTRITION STATUS      Continent Diet (Soft)  AMBULATORY STATUS COMMUNICATION OF NEEDS Skin   Limited Assist Verbally Other (Comment) (bilateral legs,feet, buttock/ischial wounds-see d/c summary)                       Personal Care Assistance Level of Assistance  Bathing, Feeding, Dressing Bathing Assistance: Limited assistance Feeding assistance: Limited assistance Dressing Assistance: Limited assistance     Functional Limitations Info  Sight, Hearing, Speech Sight Info: Impaired (eyeglasses) Hearing Info: Adequate Speech Info: Impaired (Dentures-top)    SPECIAL CARE FACTORS FREQUENCY  PT (By licensed PT), OT (By licensed OT)     PT Frequency: 5x week OT Frequency: 5x week            Contractures Contractures Info: Not present    Additional Factors Info  Code Status, Allergies, Insulin  Sliding Scale Code Status Info: Full Allergies Info: Nsaids, Penicillins, Quinapril  Hcl  Insulin  Sliding Scale Info: SSI       Current Medications (01/28/2024):  This is the current hospital active medication list Current Facility-Administered Medications  Medication Dose Route Frequency Provider Last Rate Last Admin   acetaminophen  (TYLENOL ) tablet 650 mg  650 mg Oral Q6H PRN Dianna Specking, MD   650 mg at 01/24/24 2018   Or   acetaminophen  (TYLENOL ) suppository 650 mg  650 mg  Rectal Q6H PRN Dianna Specking, MD       allopurinol  (ZYLOPRIM ) tablet 100 mg  100 mg Oral Daily Dianna Specking, MD   100 mg at 01/28/24 9170   amLODipine  (NORVASC ) tablet 5 mg  5 mg Oral q morning Dianna Specking, MD   5 mg at 01/28/24 9170   apixaban  (ELIQUIS ) tablet 5 mg  5 mg Oral BID Vann, Jessica U, DO   5 mg at 01/28/24 1245   atorvastatin  (LIPITOR) tablet 80 mg  80 mg Oral QHS Schooler, Vincent, MD   80 mg at 01/27/24 2150   budesonide -glycopyrrolate -formoterol  (BREZTRI ) 160-9-4.8 MCG/ACT inhaler 2 puff  2 puff Inhalation BID Dianna Specking, MD   2 puff at 01/28/24 0751   diclofenac  Sodium (VOLTAREN ) 1 % topical gel 2 g  2 g Topical QID Vann, Jessica U, DO   2 g at 01/28/24 1245   folic acid  (FOLVITE ) tablet 1 mg  1 mg Oral Daily Dianna Specking, MD   1 mg at 01/28/24 0830   furosemide  (LASIX ) injection 40 mg  40 mg Intravenous Q12H Sebastian Toribio GAILS, MD   40 mg at 01/28/24 9373   Gerhardt's butt cream   Topical BID Vann, Jessica U, DO   Given at 01/28/24 1244   HYDROcodone -acetaminophen  (NORCO/VICODIN) 5-325 MG per tablet 1-2 tablet  1-2 tablet Oral Q4H PRN Thompson, Daniel V, MD   2 tablet at 01/28/24 1518   insulin  aspart (novoLOG ) injection 0-15 Units  0-15 Units Subcutaneous TID WC Dianna Specking, MD   3 Units at 01/28/24 1254   insulin  aspart (novoLOG ) injection 0-5 Units  0-5 Units Subcutaneous QHS Dianna Specking, MD   4 Units at 01/24/24 2148   ipratropium-albuterol  (DUONEB) 0.5-2.5 (3) MG/3ML nebulizer solution 3 mL  3 mL Nebulization Q2H PRN Dianna Specking, MD       linezolid (ZYVOX) tablet 600 mg  600 mg Oral Q12H Sebastian Toribio GAILS, MD   600 mg at 01/28/24 9170   loratadine  (CLARITIN ) tablet 10 mg  10 mg Oral Daily Dianna Specking, MD   10 mg at 01/28/24 9170   ondansetron  (ZOFRAN ) tablet 4 mg  4 mg Oral Q6H PRN Dianna Specking, MD       Or   ondansetron  (ZOFRAN ) injection 4 mg  4 mg Intravenous Q6H PRN Dianna Specking, MD   4 mg at 01/28/24 1457    pantoprazole  (PROTONIX ) EC tablet 40 mg  40 mg Oral BID Dianna Specking, MD   40 mg at 01/28/24 0830   sucralfate (CARAFATE) 1 GM/10ML suspension 1 g  1 g Oral TID WC & HS Dianna Specking, MD   1 g at 01/28/24 1245   tiZANidine (ZANAFLEX) tablet 4 mg  4 mg Oral Q8H PRN Dianna Specking, MD   4 mg at 01/28/24 1245   traZODone (DESYREL) tablet 25 mg  25 mg Oral QHS PRN Dianna Specking, MD         Discharge Medications: Please see discharge summary for a list of discharge medications.  Relevant Imaging Results:  Relevant Lab Results:   Additional Information  ss#[id number]  Landry Kamath, Nathanel, RN

## 2024-01-28 NOTE — Progress Notes (Signed)
 Physical Therapy Treatment Patient Details Name: Paula Wheeler MRN: [id number] DOB: Mar 28, 1951 Today's Date: 01/28/2024   History of Present Illness 73 yo female admitted bil LE pain/blisters. Dx: LE cellulitis, sepsis, acute on chronic HF, anemia s/p transfusion. Hx of CKD, Afib, DM, COPD, anemia, obesity, falls.    PT Comments  Pt agreeable to working with therapy. Mobility remains limited 2* pain in bil LEs, L worse than R per pt. Discussed d/c plan again-pt lives alone and will likely require a short term rehab stay before returning home. Patient will benefit from continued inpatient follow up therapy, <3 hours/day.     If plan is discharge home, recommend the following: A lot of help with walking and/or transfers;A lot of help with bathing/dressing/bathroom;Assistance with cooking/housework;Assist for transportation;Help with stairs or ramp for entrance   Can travel by private vehicle     No  Equipment Recommendations  None recommended by PT    Recommendations for Other Services       Precautions / Restrictions Precautions Precautions: Fall Precaution/Restrictions Comments: blisters, wounds LEs Restrictions Weight Bearing Restrictions Per Provider Order: No     Mobility  Bed Mobility Overal bed mobility: Needs Assistance Bed Mobility: Supine to Sit     Supine to sit: Min assist, HOB elevated, Used rails     General bed mobility comments: A to scoot to EOB once up sitting. Cues for safety, technique. Increased time.    Transfers Overall transfer level: Needs assistance Equipment used: Rolling walker (2 wheels) Transfers: Sit to/from Stand, Bed to chair/wheelchair/BSC Sit to Stand: Min assist, From elevated surface           General transfer comment: Cues for safety, technique, hand placement. Assist to power up, steady, control descent. Increased time    Ambulation/Gait Ambulation/Gait assistance: Min assist Gait Distance (Feet): 2 Feet Assistive  device: Rolling walker (2 wheels) Gait Pattern/deviations: Step-to pattern, Antalgic, Shuffle       General Gait Details: Pt took a few shuffle steps using RW-very short distance from bed to recliner. Effortful and quite painful for pt.   Stairs             Wheelchair Mobility     Tilt Bed    Modified Rankin (Stroke Patients Only)       Balance Overall balance assessment: Needs assistance         Standing balance support: Bilateral upper extremity supported, Reliant on assistive device for balance, During functional activity Standing balance-Leahy Scale: Poor                              Communication Communication Communication: No apparent difficulties  Cognition Arousal: Alert Behavior During Therapy: WFL for tasks assessed/performed   PT - Cognitive impairments: No apparent impairments                         Following commands: Intact      Cueing Cueing Techniques: Verbal cues  Exercises      General Comments        Pertinent Vitals/Pain Pain Assessment Pain Assessment: Faces Faces Pain Scale: Hurts worst Pain Location: started at 7/10 in feet at rest and up to 10/10 with bed to recliner transfer Pain Descriptors / Indicators: Grimacing, Guarding, Tender, Throbbing, Aching, Crying Pain Intervention(s): Limited activity within patient's tolerance, Monitored during session, Repositioned, Premedicated before session    Home Living  Prior Function            PT Goals (current goals can now be found in the care plan section) Progress towards PT goals: Progressing toward goals    Frequency    Min 2X/week      PT Plan      Co-evaluation              AM-PAC PT 6 Clicks Mobility   Outcome Measure  Help needed turning from your back to your side while in a flat bed without using bedrails?: A Little Help needed moving from lying on your back to sitting on the side of a  flat bed without using bedrails?: A Little Help needed moving to and from a bed to a chair (including a wheelchair)?: A Little Help needed standing up from a chair using your arms (e.g., wheelchair or bedside chair)?: A Little Help needed to walk in hospital room?: A Lot Help needed climbing 3-5 steps with a railing? : Total 6 Click Score: 15    End of Session Equipment Utilized During Treatment: Gait belt Activity Tolerance: Patient limited by pain Patient left: in chair;with call bell/phone within reach   PT Visit Diagnosis: Muscle weakness (generalized) (M62.81);Pain;Other abnormalities of gait and mobility (R26.89);Difficulty in walking, not elsewhere classified (R26.2) Pain - part of body: Leg     Time: 1204-1221 PT Time Calculation (min) (ACUTE ONLY): 17 min  Charges:    $Gait Training: 8-22 mins PT General Charges $$ ACUTE PT VISIT: 1 Visit                        Dannial SQUIBB, PT Acute Rehabilitation  Office: [phone]

## 2024-01-28 NOTE — Progress Notes (Signed)
 PT Cancellation Note  Patient Details Name: Paula Wheeler MRN: [id number] DOB: 1951/03/29   Cancelled Treatment:    Reason Eval/Treat Not Completed:  Attempted tx session-pt requested PT check back another time-after next dose of pain meds. Will check back as schedule allows.    Dannial SQUIBB, PT Acute Rehabilitation  Office: [phone]

## 2024-01-28 NOTE — Consult Note (Addendum)
 WOC Nurse Consult Note: Reason for Consult:Asked to consult again on wounds on patient.  Specifically left heel.  Current orders in place for nonintact lesions to right and left anterior lower leg.  Right dorsal foot.  Bilateral heel wounds. Has moisture associated skin damage to upper buttocks in gluteal fold.  Acute on chronic CHF on admission with fluid volume overload, improving.  Has [employer] urinary manager in place.  Wound type: Pressure (DTI) to bilateral heels, present on admission.  MASD buttocks Right ischial stage 3 pressure injury Infectious wounds to bilateral anterior lower legs- cellulitis remains evident.  Line of demarcation has not retreated.  Photo placed in chart. WBC count trending down to 9.3 today.  Pressure Injury POA: Yes Measurement: Left heel:  6 cm x 3 cm intact blood filled blister consistent with deep tissue injury.  Wound bed:Red and moist to buttocks/ischial wounds Red and moist to anterior legs.  Right heel 0.2 cm intact maroon discoloration.  Drainage (amount, consistency, odor) minimal weeping from MASD to buttocks and ischial wounds. Minimal serosanguinous to right anterior lower leg wounds.  Periwound: Erythema and warmth to bilateral lower legs.  Edema has improved.  WIll stop ace wraps for now to allow for ongoing visualization of cellulitis.  Dressing procedure/placement/frequency:continue Xeroform.  Foam dressings to heels and add Prevalon boots to offload pressure Gerhardts to buttocks wound.  Will not follow at this time.  Please re-consult if needed.  Darice Cooley MSN, RN, FNP-BC CWON Wound, Ostomy, Continence Nurse Outpatient [facility] [phone] Work cell phone:  [phone]

## 2024-01-28 NOTE — Plan of Care (Signed)
  Problem: Clinical Measurements: Goal: Ability to maintain clinical measurements within normal limits will improve Outcome: Progressing Goal: Diagnostic test results will improve Outcome: Progressing Goal: Respiratory complications will improve Outcome: Progressing Goal: Cardiovascular complication will be avoided Outcome: Progressing   Problem: Coping: Goal: Level of anxiety will decrease Outcome: Progressing   Problem: Pain Managment: Goal: General experience of comfort will improve and/or be controlled Outcome: Progressing   Problem: Safety: Goal: Ability to remain free from injury will improve Outcome: Progressing

## 2024-01-28 NOTE — TOC Progression Note (Signed)
 Transition of Care [facility]) - Progression Note    Patient Details  Name: MERCADEZ HEITMAN MRN: [id number] Date of Birth: [birth year]  Transition of Care [facility]) CM/SW Contact  Rayford Williamsen, Nathanel, RN Phone Number: 01/28/2024, 3:53 PM  Clinical Narrative:PT recc ST SNF. Patient agrees to fax out for ST SNF-states she does not drink etoh.Await bed offers,choice,then auth.      Expected Discharge Plan: Skilled Nursing Facility Barriers to Discharge: Continued Medical Work up               Expected Discharge Plan and Services In-house Referral: Clinical Social Work   Post Acute Care Choice: Skilled Nursing Facility Living arrangements for the past 2 months: Single Family Home                 DME Arranged: N/A DME Agency: NA                   Social Drivers of Health (SDOH) Interventions SDOH Screenings   Food Insecurity: No Food Insecurity (01/24/2024)  Housing: Low Risk  (01/24/2024)  Transportation Needs: No Transportation Needs (01/24/2024)  Utilities: Not At Risk (01/24/2024)  Social Connections: Socially Isolated (01/24/2024)  Tobacco Use: High Risk (01/26/2024)    Readmission Risk Interventions    01/26/2024    8:31 AM  Readmission Risk Prevention Plan  Transportation Screening Complete  HRI or Home Care Consult Complete  Social Work Consult for Recovery Care Planning/Counseling Complete  Palliative Care Screening Not Applicable  Medication Review Oceanographer) Complete

## 2024-01-29 DIAGNOSIS — A419 Sepsis, unspecified organism: Secondary | ICD-10-CM | POA: Diagnosis not present

## 2024-01-29 DIAGNOSIS — L0390 Cellulitis, unspecified: Secondary | ICD-10-CM | POA: Diagnosis not present

## 2024-01-29 LAB — BASIC METABOLIC PANEL WITH GFR
Anion gap: 13 (ref 5–15)
BUN: 45 mg/dL — ABNORMAL HIGH (ref 8–23)
CO2: 26 mmol/L (ref 22–32)
Calcium: 9.1 mg/dL (ref 8.9–10.3)
Chloride: 101 mmol/L (ref 98–111)
Creatinine, Ser: 2.26 mg/dL — ABNORMAL HIGH (ref 0.44–1.00)
GFR, Estimated: 22 mL/min — ABNORMAL LOW (ref >60)
Glucose, Bld: 150 mg/dL — ABNORMAL HIGH (ref 70–99)
Potassium: 4.1 mmol/L (ref 3.5–5.1)
Sodium: 139 mmol/L (ref 135–145)

## 2024-01-29 LAB — CBC
HCT: 32.5 % — ABNORMAL LOW (ref 36.0–46.0)
Hemoglobin: 9.8 g/dL — ABNORMAL LOW (ref 12.0–15.0)
MCH: 26.2 pg (ref 26.0–34.0)
MCHC: 30.2 g/dL (ref 30.0–36.0)
MCV: 86.9 fL (ref 80.0–100.0)
Platelets: 287 K/uL (ref 150–400)
RBC: 3.74 MIL/uL — ABNORMAL LOW (ref 3.87–5.11)
RDW: 20.4 % — ABNORMAL HIGH (ref 11.5–15.5)
WBC: 9.6 K/uL (ref 4.0–10.5)
nRBC: 0 % (ref 0.0–0.2)

## 2024-01-29 LAB — CULTURE, BLOOD (ROUTINE X 2): Culture: NO GROWTH

## 2024-01-29 LAB — GLUCOSE, CAPILLARY
Glucose-Capillary: 166 mg/dL — ABNORMAL HIGH (ref 70–99)
Glucose-Capillary: 166 mg/dL — ABNORMAL HIGH (ref 70–99)
Glucose-Capillary: 166 mg/dL — ABNORMAL HIGH (ref 70–99)
Glucose-Capillary: 131 mg/dL — ABNORMAL HIGH (ref 70–99)

## 2024-01-29 MED ORDER — POLYETHYLENE GLYCOL 3350 17 G PO PACK
17.0000 g | PACK | Freq: Every day | ORAL | Status: DC | PRN
  Administered 2024-01-29 – 2024-02-01 (×3): 17 g via ORAL
  Filled 2024-01-29 (×4): qty 1

## 2024-01-29 NOTE — TOC Progression Note (Signed)
 Transition of Care [facility]) - Progression Note   Patient Details  Name: Paula Wheeler MRN: [id number] Date of Birth: [birth year]  Transition of Care [facility]) CM/SW Contact  Duwaine GORMAN [name], LCSW Phone Number: 01/29/2024, 11:14 AM  Clinical Narrative: CSW provided patient and brother with list of bed offers with Medicare star ratings. Patient received the following bed offers:  [facility] [address] [place], [place] [address] [phone] Overall rating ??   [facility] [address] [place], [place] [address] [phone] Overall rating ?  [facility] [address] [place], [place] [address] [phone] Overall rating ?  [facility] [address] [place], [place] [address] [phone] Overall rating ???  [facility] [address] [place], [place] [address] [phone] Overall rating ?  [facility] [address] [place], [place] [address] [phone] Overall rating ??  [facility] [address] [place], [place] [address] [phone] Overall rating ?  [facility] and [facility] [address] [place], [place] [address] [phone] Overall rating ?  Patient expressed frustration with the low ratings of the facilities. CSW explained that several higher rated facilities did not extend bed offers as patient will be going under her commercial insurance, which is out of network with many facilities. Patient requested SNFs further out in [facility]. Many will be outside the 50 mile radius from the hospital. CSW explained to patient she would need to find the facility and if family cannot transport her to SNF, she will need to private pay for PTAR up front. Patient requested time to consider her options.  Expected Discharge Plan: Skilled Nursing  Facility Barriers to Discharge: Continued Medical Work up  Expected Discharge Plan and Services In-house Referral: Clinical Social Work Post Acute Care Choice: Skilled Nursing Facility Living arrangements for the past 2 months: Single Family Home          DME Arranged: N/A DME Agency: NA  Social Drivers of Health (SDOH) Interventions SDOH Screenings   Food Insecurity: No Food Insecurity (01/24/2024)  Housing: Low Risk  (01/24/2024)  Transportation Needs: No Transportation Needs (01/24/2024)  Utilities: Not At Risk (01/24/2024)  Social Connections: Socially Isolated (01/24/2024)  Tobacco Use: High Risk (01/26/2024)   Readmission Risk Interventions    01/26/2024    8:31 AM  Readmission Risk Prevention Plan  Transportation Screening Complete  HRI or Home Care Consult Complete  Social Work Consult for Recovery Care Planning/Counseling Complete  Palliative Care Screening Not Applicable  Medication Review Oceanographer) Complete

## 2024-01-29 NOTE — Progress Notes (Signed)
 PROGRESS NOTE    Paula Wheeler  FMW:[id number] DOB: 1950-11-17 DOA: 01/24/2024 PCP: Okey Carlin Redbird, MD    Chief Complaint  Patient presents with   Skin Problem    Brief Narrative:  Patient 73 year old female history of CKD stage IV, history of prior upper GI bleed, persistent A-fib on Eliquis , type 2 diabetes presented to the hospital with sepsis secondary to bilateral lower extremity cellulitis and also noted to have acute on chronic anemia with hemoglobin as low as 5.2.  Status posttransfusion 4 units PRBCs. GI consulted.  Patient status post EGD 01/26/2024 which was consistent with erosive esophagitis and gastritis.  Patient being diuresed for an acute CHF exacerbation and also on antibiotics for cellulitis.    Assessment & Plan:   Principal Problem:   Sepsis due to cellulitis [facility]) Active Problems:   Symptomatic anemia   Diabetes mellitus with complication (HCC)   Essential hypertension   HLD (hyperlipidemia)   Persistent atrial fibrillation   PAD (peripheral artery disease)   Type II diabetes mellitus with renal manifestations (HCC)   GERD (gastroesophageal reflux disease)   Depression   CKD (chronic kidney disease) stage 4, GFR 15-29 ml/min (HCC)   Chronic diastolic CHF (congestive heart failure) (HCC)   Pressure injury of skin   Anemia, iron  deficiency   Acute on chronic diastolic congestive heart failure (HCC)   severe sepsis secondary to bilateral lower extremity cellulitis - Patient met criteria for sepsis on presentation due to tachycardia, leukocytosis, initial lactic level of 3.4. - Patient noted to have been on doxycycline  outpatient send in the past however no significant improvement. - Patient given a bolus of IV fluids on admission. -Plain films of the lower extremities negative for any acute fracture however consistent with a cellulitis. -Lower extremity Dopplers negative for DVT. -Blood cultures ordered and pending. - Patient on IV vancomycin  and  will transition to Zyvox to complete a 7-day course of antibiotic treatment.  - Supportive care.   Acute on chronic anemia/iron  deficiency anemia/symptomatic anemia/folate deficiency/erosive esophagitis and gastritis.  EGD of 01/26/2024 -Patient with a history of persistent A-fib on chronic anticoagulation with Eliquis . - Patient states was initially on oral iron  replacement however that got discontinued per MD while she was on antibiotics in July and never resumed. - Patient denies any overt GI bleed, no melena, no hematemesis, no hematochezia. - Patient does endorse fatigue for several weeks - Hemoglobin on admission noted at 5.2. - Anemia panel obtained with a iron  level of 271, TIBC of 316, folate of 5.1, ferritin of 16. -Status posttransfusion 4 units PRBCs. -Hemoglobin currently at 11.3 this morning. -Continue folic acid  1 mg daily. -Patient seen in consultation by GI and patient underwent upper endoscopy 01/26/2024 which showed LA grade C reflux esophagitis with no bleeding, gastritis which was biopsied, normal examined duodenum. -Continue Carafate slurry 4 times daily, IV PPI every 12 hours and transition to oral PPI tomorrow per GI. -Diet advanced to a soft diet. - Follow H&H. - Transfusion threshold hemoglobin < 8. - resume Eliquis  on 01/28/2024 per GI recommendations. -Outpatient follow-up with GI, Dr. Dianna, in 6 to 8 weeks.  Persistent A-fib -Currently rate controlled. - Per Dr. Sebastian: Continue to hold Eliquis  until 01/28/2024 as these were GI recommendations.   Acute on chronic diastolic CHF -Patient with some bibasilar crackles noted on examination. - Patient with lower extremity edema likely in part secondary to an acute cellulitis. -2D echo (07/18/2023) with a EF of 60 to 65%,NWMA, moderately dilated left  atrial size. - proBNP elevated at 3354.  - Patient noted to be on Demadex  prior to admission currently on hold. - Continue current dose of Lasix  40 mg IV every 12  hours.  -If continued improvement could likely transition back to home regimen Demadex  in the next 24 to 48 hours. - Strict I's and O's, daily weights.   Diabetes mellitus type 2 -Hemoglobin A1c 7.0. -SSI only for now due to hypoglycemia   Left lower extremity pain -Patient with complaints of left lower extremity pain with difficulty ambulating. - X-ray unrevealing - CT scan shows some subcutaneous edema, suspect related to heart failure so we will continue IV Lasix -doubt any infection as white blood cell count has normalized and patient is afebrile - Has already had duplex of lower extremities without any clot seen   CKD stage IV -Stable. - Currently at baseline.   Hypertension -.  Continue home regimen Norvasc .   - Continue IV Lasix  secondary to CHF.-Changed to oral when discharge  Hyperlipidemia - Statin.     GERD - Continue IV PPI.   Pressure injury, POA -Wound care: Wound 01/24/24 1600 Pressure Injury Heel Right Deep Tissue Pressure Injury - Purple or maroon localized area of discolored intact skin or blood-filled blister due to damage of underlying soft tissue from pressure and/or shear. (Active)     Wound 01/24/24 1700 Pressure Injury Ischial tuberosity Right Stage 2 -  Partial thickness loss of dermis presenting as a shallow open injury with a red, pink wound bed without slough. (Active)       DVT prophylaxis: SCDs Code Status: Full Family Communication: Updated patient.  No family at bedside. Disposition: SNF    Consultants:  Gastroenterology: Dr. Dianna 01/25/2024  Procedures:  Transfusion 4 units PRBCs : Chest x-ray 01/24/2024 Plain film of bilateral feet 01/24/2024 Plain films of bilateral tib-fib 01/24/2024 Lower extremity Dopplers 01/25/2024    Subjective: Leg pain is not worse, pain medicines help  Objective: Vitals:   01/29/24 0500 01/29/24 0531 01/29/24 0825 01/29/24 1331  BP:  (!) 132/52  (!) 131/58  Pulse:  81  63  Resp:  17  16  Temp:   98.1 F (36.7 C)  98.8 F (37.1 C)  TempSrc:    Oral  SpO2:  90% 91% 94%  Weight: 82 kg     Height:        Intake/Output Summary (Last 24 hours) at 01/29/2024 1428 Last data filed at 01/29/2024 9057 Gross per 24 hour  Intake 240 ml  Output 600 ml  Net -360 ml   Filed Weights   01/26/24 1222 01/27/24 0500 01/29/24 0500  Weight: 89.8 kg 86.9 kg 82 kg    Examination:   General: Appearance:    Obese female in no acute distress     Lungs:      respirations unlabored  Heart:    Normal heart rate. irr   MS:   All extremities are intact.   Neurologic:   Awake, alert, oriented x 3. No apparent focal neurological           defect.        Data Reviewed: I have personally reviewed following labs and imaging studies  CBC: Recent Labs  Lab 01/24/24 1032 01/25/24 0446 01/25/24 1649 01/26/24 0501 01/27/24 0556 01/28/24 0502 01/29/24 0502  WBC 14.9* 14.9*  --  14.9* 12.0* 9.3 9.6  NEUTROABS 13.7*  --   --  12.7* 10.0* 7.3  --   HGB 5.2* 7.3* 9.8* 10.5*  11.3* 10.4* 9.8*  HCT 19.2* 24.4* 31.6* 34.3* 36.6 33.7* 32.5*  MCV 85.7 81.9  --  83.3 84.3 85.3 86.9  PLT 323 297  --  335 326 296 287    Basic Metabolic Panel: Recent Labs  Lab 01/25/24 0446 01/26/24 0501 01/27/24 0556 01/28/24 0502 01/29/24 0502  NA 135 135 138 139 139  K 4.0 3.8 3.6 3.8 4.1  CL 101 100 101 101 101  CO2 20* 21* 22 26 26   GLUCOSE 219* 168* 116* 126* 150*  BUN 68* 51* 49* 47* 45*  CREATININE 2.11* 2.13* 1.99* 2.19* 2.26*  CALCIUM  9.1 9.4 9.5 9.2 9.1  MG  --  2.1 2.0 1.8  --     GFR: Estimated Creatinine Clearance: 23.9 mL/min (A) (by C-G formula based on SCr of 2.26 mg/dL (H)).  Liver Function Tests: Recent Labs  Lab 01/24/24 1032  AST 12*  ALT 13  ALKPHOS 108  BILITOT 0.7  PROT 5.6*  ALBUMIN 3.3*    CBG: Recent Labs  Lab 01/28/24 1147 01/28/24 1639 01/28/24 2011 01/29/24 0726 01/29/24 1122  GLUCAP 196* 226* 228* 166* 166*     Recent Results (from the past 240 hours)   Culture, blood (routine x 2)     Status: None (Preliminary result)   Collection Time: 01/24/24 11:14 AM   Specimen: BLOOD LEFT FOREARM  Result Value Ref Range Status   Specimen Description   Final    BLOOD LEFT FOREARM Performed at [facility], [address] [place], [place] [address]    Special Requests   Final    BOTTLES DRAWN AEROBIC AND ANAEROBIC Blood Culture adequate volume Performed at [facility], [address], [place], [place] [address]    Culture   Final    NO GROWTH 4 DAYS Performed at [facility], [address] [place], [place] [address]    Report Status PENDING  Incomplete  Culture, blood (routine x 2)     Status: None   Collection Time: 01/24/24 11:35 AM   Specimen: BLOOD  Result Value Ref Range Status   Specimen Description   Final    BLOOD RIGHT ANTECUBITAL Performed at [facility], [address], [place], [place] [address]    Special Requests   Final    BOTTLES DRAWN AEROBIC AND ANAEROBIC Blood Culture results may not be optimal due to an inadequate volume of blood received in culture bottles Performed at [facility], [address] [place], [place] [address]    Culture   Final    NO GROWTH 5 DAYS Performed at [facility], [address], [place], [place] [address]    Report Status 01/29/2024 FINAL  Final         Radiology Studies: CT HIP LEFT WO CONTRAST Result Date: 01/28/2024 EXAM: CT OF THE LEFT HIP WITHOUT IV CONTRAST 01/28/2024 05:58:00 PM TECHNIQUE: CT of the left hip was performed without the administration of intravenous contrast. Multiplanar reformatted images are provided for review. Automated exposure control, iterative reconstruction, and/or weight based adjustment of the mA/kV was utilized to reduce the radiation dose to as low as reasonably achievable. COMPARISON: Femur radiographs 01/27/2024 and CT pelvis 09/27/2023. CLINICAL HISTORY: Chronic hip pain,  no prior imaging. FINDINGS: BONES: No acute fracture or dislocation. No left hemipelvic fracture. No aggressive appearing osseous abnormality or periostitis. Mild sclerosis along the sacral side of the SI joint with mild degenerative SI joint arthropathy on the  left. Occlusion of the lower lumbar spine demonstrates moderate bilateral foraminal impingement at L4-L5 and L5-S1 due to degenerative disc disease and spondylosis. There is also borderline central stenosis at L5-S1. SOFT TISSUE: Regional muscular atrophy likely from sarcopenia. This is more pronounced in the gluteus minimus muscle. Subcutaneous edema lateral to the left hip and tracking down the lateral left thigh, and also along the medial left thigh. Mild nonspecific presacral edema. No soft tissue mass. No compelling findings of regional bursitis. JOINT: Mild degenerative spurring of the left femoral head and acetabulum with mild craniocaudad and axial degenerative chondral thinning. No hip joint effusion. No osseous erosions. INTRAPELVIC CONTENTS: Systemic atherosclerosis is present, including the aorta and iliac arteries. Limited images of the intrapelvic contents are otherwise unremarkable. IMPRESSION: 1. Moderate bilateral foraminal impingement at L4-5 and L5-S1 due to degenerative disc disease and spondylosis, with borderline central stenosis at L5-S1. 2. Mild degenerative changes in the left hip joint. 3. Mild degenerative left sacroiliac joint arthropathy. 4. Subcutaneous edema along the lateral and medial left thigh. 5. Regional muscular atrophy, more pronounced in the gluteus minimus. 6. Systemic atherosclerosis involving the aorta and iliac arteries. 7. Mild nonspecific presacral edema. 8. No acute osseous abnormality. Electronically signed by: Ryan Salvage MD 01/28/2024 06:12 PM EDT RP Workstation: [id number]   DG FEMUR 1V LEFT Result Date: 01/27/2024 CLINICAL DATA:  Pain. EXAM: LEFT FEMUR 1 VIEW COMPARISON:  None Available. FINDINGS:  Divided frontal views of the left femur. No fracture. No evidence of focal bone abnormality or erosion. No hip or knee dislocation. Vascular calcifications are seen. IMPRESSION: No acute findings. Vascular calcifications. Electronically Signed   By: Andrea Gasman M.D.   On: 01/27/2024 17:16        Scheduled Meds:  allopurinol   100 mg Oral Daily   amLODipine   5 mg Oral q morning   apixaban   5 mg Oral BID   atorvastatin   80 mg Oral QHS   budesonide -glycopyrrolate -formoterol   2 puff Inhalation BID   diclofenac  Sodium  2 g Topical QID   folic acid   1 mg Oral Daily   furosemide   40 mg Intravenous Q12H   Gerhardt's butt cream   Topical BID   insulin  aspart  0-15 Units Subcutaneous TID WC   insulin  aspart  0-5 Units Subcutaneous QHS   linezolid  600 mg Oral Q12H   loratadine   10 mg Oral Daily   pantoprazole   40 mg Oral BID   sucralfate  1 g Oral TID WC & HS   Continuous Infusions:     LOS: 5 days    Time spent: 40 minutes    Harlene RAYMOND Bowl, DO [facility]   To contact the attending provider between 7A-7P or the covering provider during after hours 7P-7A, please log into the web site [email or web address] and access using universal [place] password for that web site. If you do not have the password, please call the hospital operator.  01/29/2024, 2:28 PM

## 2024-01-29 NOTE — Plan of Care (Signed)
  Problem: Clinical Measurements: Goal: Ability to maintain clinical measurements within normal limits will improve Outcome: Progressing Goal: Diagnostic test results will improve Outcome: Progressing Goal: Respiratory complications will improve Outcome: Progressing Goal: Cardiovascular complication will be avoided Outcome: Progressing   Problem: Activity: Goal: Risk for activity intolerance will decrease Outcome: Progressing   Problem: Pain Managment: Goal: General experience of comfort will improve and/or be controlled Outcome: Progressing   Problem: Skin Integrity: Goal: Risk for impaired skin integrity will decrease Outcome: Progressing

## 2024-01-30 DIAGNOSIS — L0390 Cellulitis, unspecified: Secondary | ICD-10-CM | POA: Diagnosis not present

## 2024-01-30 DIAGNOSIS — A419 Sepsis, unspecified organism: Secondary | ICD-10-CM | POA: Diagnosis not present

## 2024-01-30 LAB — BASIC METABOLIC PANEL WITH GFR
Anion gap: 14 (ref 5–15)
BUN: 47 mg/dL — ABNORMAL HIGH (ref 8–23)
CO2: 26 mmol/L (ref 22–32)
Calcium: 9.2 mg/dL (ref 8.9–10.3)
Chloride: 99 mmol/L (ref 98–111)
Creatinine, Ser: 2.5 mg/dL — ABNORMAL HIGH (ref 0.44–1.00)
GFR, Estimated: 20 mL/min — ABNORMAL LOW (ref >60)
Glucose, Bld: 149 mg/dL — ABNORMAL HIGH (ref 70–99)
Potassium: 4 mmol/L (ref 3.5–5.1)
Sodium: 139 mmol/L (ref 135–145)

## 2024-01-30 LAB — GLUCOSE, CAPILLARY
Glucose-Capillary: 163 mg/dL — ABNORMAL HIGH (ref 70–99)
Glucose-Capillary: 166 mg/dL — ABNORMAL HIGH (ref 70–99)
Glucose-Capillary: 183 mg/dL — ABNORMAL HIGH (ref 70–99)
Glucose-Capillary: 167 mg/dL — ABNORMAL HIGH (ref 70–99)

## 2024-01-30 LAB — CBC
HCT: 33.5 % — ABNORMAL LOW (ref 36.0–46.0)
Hemoglobin: 9.7 g/dL — ABNORMAL LOW (ref 12.0–15.0)
MCH: 25.5 pg — ABNORMAL LOW (ref 26.0–34.0)
MCHC: 29 g/dL — ABNORMAL LOW (ref 30.0–36.0)
MCV: 88.2 fL (ref 80.0–100.0)
Platelets: 270 K/uL (ref 150–400)
RBC: 3.8 MIL/uL — ABNORMAL LOW (ref 3.87–5.11)
RDW: 20.3 % — ABNORMAL HIGH (ref 11.5–15.5)
WBC: 8.4 K/uL (ref 4.0–10.5)
nRBC: 0 % (ref 0.0–0.2)

## 2024-01-30 LAB — CULTURE, BLOOD (ROUTINE X 2)
Culture: NO GROWTH
Special Requests: ADEQUATE

## 2024-01-30 MED ORDER — TORSEMIDE 20 MG PO TABS
60.0000 mg | ORAL_TABLET | Freq: Every day | ORAL | Status: DC
Start: 2024-01-30 — End: 2024-02-05
  Administered 2024-01-30 – 2024-02-05 (×6): 60 mg via ORAL
  Filled 2024-01-30 (×7): qty 3

## 2024-01-30 NOTE — Progress Notes (Signed)
 Physical Therapy Treatment Patient Details Name: Paula Wheeler MRN: [id number] DOB: 1950-09-28 Today's Date: 01/30/2024   History of Present Illness 73 yo female admitted bil LE pain/blisters. Dx: LE cellulitis, sepsis, acute on chronic HF, anemia s/p transfusion. Hx of CKD, Afib, DM, COPD, anemia, obesity, falls.    PT Comments  Cognition Comments: AxO x 3 pleasant and willing.  Prior home alone and IND (no driving) with Family coming 1-2 times per week.  Lots of ANXIETY.  Low pain threshold. Pt was premedicated with 2 tablets of HYDROcodone .  Assisted OOB to amb was difficult.  General bed mobility comments: Pt able to self guide B LE but Required increased assist for upper body and increased time and effort esp to scoot self to EOB and place B LE on floor.  Pt very nervous.  I can't walk. General transfer comment: Pt required Min Assist to rise form eelvated bed onto walker with decreased self WBing L LE due to L LE pain all over then presented with increased anxiety/fear.  Required positive reinforcement and reassurance. General Gait Details: Pt was only able to take a few steps due to reports 10/10 L LE pain during WBing which elevated her anxiety.  Pt reports pain as sharpe and intense as well as burning at L shin area below the knee.  Recliner was pulled up to Pt from behind. Positioned to comfort. Performed a MMT L LE to pin point her pain.  Able to tolerated pressure through her heel as well as forefoot no pain.  Did have a positive pain response to ankle DorsiFlexion anterior shin Tibialis Anterior on fire. Educated Pt that Cellutilis can be very painful and will take time to dissipate. Pt did admit better than when I came In.  Educated on pain control use of meds as well as time to heal.  Pt admits a lot of her anxiety comes from the unknown as well as fear of causing more pain.   LPT has rec Pt will need ST Rehab at SNF to address mobility and functional decline  prior to safely returning home.    If plan is discharge home, recommend the following: A lot of help with walking and/or transfers;A lot of help with bathing/dressing/bathroom;Assistance with cooking/housework;Assist for transportation;Help with stairs or ramp for entrance   Can travel by private vehicle     No  Equipment Recommendations  None recommended by PT    Recommendations for Other Services       Precautions / Restrictions Precautions Precautions: Fall Precaution/Restrictions Comments: blisters, wounds LEs     Mobility  Bed Mobility   Bed Mobility: Supine to Sit     Supine to sit: Min assist, Mod assist     General bed mobility comments: Pt able to self guide B LE but Required increased assist for upper body and increased time and effort esp to scoot self to EOB and place B LE on floor.  Pt very nervous.  I can't walk.    Transfers Overall transfer level: Needs assistance Equipment used: Rolling walker (2 wheels) Transfers: Sit to/from Stand Sit to Stand: Min assist, From elevated surface           General transfer comment: Pt required Min Assist to rise form eelvated bed onto walker with decreased self WBing L LE due to L LE pain all over then presented with increased anxiety/fear.  Required positive reinforcement and reassurance.    Ambulation/Gait Ambulation/Gait assistance: Mod assist, Max assist Gait Distance (Feet): 2  Feet Assistive device: Rolling walker (2 wheels) Gait Pattern/deviations: Step-to pattern, Antalgic, Shuffle Gait velocity: decreased     General Gait Details: Pt was only able to take a few steps due to reports 10/10 L LE pain during WBing which elevated her anxiety.  Pt reports pain as sharpe and intense as well as burning at L shin area below the knee.  Recliner was pulled up to Pt from behind.   Stairs             Wheelchair Mobility     Tilt Bed    Modified Rankin (Stroke Patients Only)       Balance                                             Communication Communication Communication: No apparent difficulties  Cognition Arousal: Alert Behavior During Therapy: WFL for tasks assessed/performed   PT - Cognitive impairments: No apparent impairments                       PT - Cognition Comments: AxO x 3 pleasant and willing.  Prior home alone and IND (no driving) with Family coming 1-2 times per week.  Lots of ANXIETY.  Low pain threshold. Following commands: Intact      Cueing Cueing Techniques: Verbal cues  Exercises      General Comments        Pertinent Vitals/Pain Pain Assessment Pain Assessment: 0-10 Pain Score: 10-Worst pain ever Faces Pain Scale: Hurts worst Pain Location: Pre Medicated prior to session.  Reports 10/10 pain L LE shin area (Tibialis Anterior)  with WBing and esp ankle Dorsi Flexion.  Sharpe.  on fire reported Pt. Pain Descriptors / Indicators: Grimacing, Guarding, Tender, Throbbing, Aching, Crying Pain Intervention(s): Premedicated before session, Repositioned    Home Living                          Prior Function            PT Goals (current goals can now be found in the care plan section) Progress towards PT goals: Progressing toward goals    Frequency    Min 2X/week      PT Plan      Co-evaluation              AM-PAC PT 6 Clicks Mobility   Outcome Measure  Help needed turning from your back to your side while in a flat bed without using bedrails?: A Lot Help needed moving from lying on your back to sitting on the side of a flat bed without using bedrails?: A Lot Help needed moving to and from a bed to a chair (including a wheelchair)?: A Lot Help needed standing up from a chair using your arms (e.g., wheelchair or bedside chair)?: A Lot Help needed to walk in hospital room?: A Lot Help needed climbing 3-5 steps with a railing? : Total 6 Click Score: 11    End of Session  Equipment Utilized During Treatment: Gait belt Activity Tolerance: Patient limited by pain Patient left: in chair;with call bell/phone within reach;with chair alarm set Nurse Communication: Mobility status PT Visit Diagnosis: Muscle weakness (generalized) (M62.81);Pain;Other abnormalities of gait and mobility (R26.89);Difficulty in walking, not elsewhere classified (R26.2) Pain - part of body: Leg     Time: 8867-8842  PT Time Calculation (min) (ACUTE ONLY): 25 min  Charges:    $Gait Training: 8-22 mins $Therapeutic Activity: 8-22 mins PT General Charges $$ ACUTE PT VISIT: 1 Visit                    Katheryn Leap  PTA Acute  Rehabilitation Services Office M-F          [phone]

## 2024-01-30 NOTE — Plan of Care (Signed)
  Problem: Clinical Measurements: Goal: Ability to maintain clinical measurements within normal limits will improve Outcome: Progressing Goal: Diagnostic test results will improve Outcome: Progressing Goal: Respiratory complications will improve Outcome: Progressing Goal: Cardiovascular complication will be avoided Outcome: Progressing   Problem: Elimination: Goal: Will not experience complications related to bowel motility Outcome: Progressing   Problem: Pain Managment: Goal: General experience of comfort will improve and/or be controlled Outcome: Progressing   Problem: Safety: Goal: Ability to remain free from injury will improve Outcome: Progressing

## 2024-01-30 NOTE — TOC Progression Note (Signed)
 Transition of Care [facility]) - Progression Note   Patient Details  Name: Paula Wheeler MRN: [id number] Date of Birth: 04-Feb-1951  [facility]) CM/SW Contact  Duwaine GORMAN [name], LCSW Phone Number: 01/30/2024, 1:58 PM  Clinical Narrative: Patient chose [facility]. CSW confirmed bed with Tammy in admissions. Facility started English as a second language teacher. Patient is medically ready for discharge when SNF is approved. Christine with [employer] [phone]) will be on call this weekend if the patient is approved for SNF. Care management to follow.  Expected Discharge Plan: Skilled Nursing Facility Barriers to Discharge: Insurance Authorization  Expected Discharge Plan and Services In-house Referral: Clinical Social Work Post Acute Care Choice: Skilled Nursing Facility Living arrangements for the past 2 months: Single Family Home            DME Arranged: N/A DME Agency: NA  Social Drivers of Health (SDOH) Interventions SDOH Screenings   Food Insecurity: No Food Insecurity (01/24/2024)  Housing: Low Risk  (01/24/2024)  Transportation Needs: No Transportation Needs (01/24/2024)  Utilities: Not At Risk (01/24/2024)  Social Connections: Socially Isolated (01/24/2024)  Tobacco Use: High Risk (01/26/2024)   Readmission Risk Interventions    01/26/2024    8:31 AM  Readmission Risk Prevention Plan  Transportation Screening Complete  HRI or Home Care Consult Complete  Social Work Consult for Recovery Care Planning/Counseling Complete  Palliative Care Screening Not Applicable  Medication Review Oceanographer) Complete

## 2024-01-30 NOTE — Progress Notes (Addendum)
 PROGRESS NOTE    Paula Wheeler  FMW:[id number] DOB: [birth year] DOA: 01/24/2024 PCP: Okey Carlin Redbird, MD    Chief Complaint  Patient presents with   Skin Problem    Brief Narrative:  Patient 73 year old female history of CKD stage IV, history of prior upper GI bleed, persistent A-fib on Eliquis , type 2 diabetes presented to the hospital with sepsis secondary to bilateral lower extremity cellulitis and also noted to have acute on chronic anemia with hemoglobin as low as 5.2.  Status posttransfusion 4 units PRBCs. GI consulted.  Patient status post EGD 01/26/2024 which was consistent with erosive esophagitis and gastritis.  Patient being diuresed for an acute CHF exacerbation and also on antibiotics for cellulitis.  Await SNF placement.   Assessment & Plan:   Principal Problem:   Sepsis due to cellulitis [facility]) Active Problems:   Symptomatic anemia   Diabetes mellitus with complication (HCC)   Essential hypertension   HLD (hyperlipidemia)   Persistent atrial fibrillation   PAD (peripheral artery disease)   Type II diabetes mellitus with renal manifestations (HCC)   GERD (gastroesophageal reflux disease)   Depression   CKD (chronic kidney disease) stage 4, GFR 15-29 ml/min (HCC)   Chronic diastolic CHF (congestive heart failure) (HCC)   Pressure injury of skin   Anemia, iron  deficiency   Acute on chronic diastolic congestive heart failure (HCC)   severe sepsis secondary to bilateral lower extremity cellulitis - Patient met criteria for sepsis on presentation due to tachycardia, leukocytosis, initial lactic level of 3.4. - Patient noted to have been on doxycycline  outpatient send in the past however no significant improvement. - Patient given a bolus of IV fluids on admission. -Plain films of the lower extremities negative for any acute fracture however consistent with a cellulitis. -Lower extremity Dopplers negative for DVT. -Blood cultures NGTS - Patient on IV vancomycin   and will transition to Zyvox to complete a 7-day course of antibiotic treatment.  - Supportive care.   Acute on chronic anemia/iron  deficiency anemia/symptomatic anemia/folate deficiency/erosive esophagitis and gastritis.  EGD of 01/26/2024 -Patient with a history of persistent A-fib on chronic anticoagulation with Eliquis . - Patient states was initially on oral iron  replacement however that got discontinued per MD while she was on antibiotics in July and never resumed. - Patient denies any overt GI bleed, no melena, no hematemesis, no hematochezia. - Patient does endorse fatigue for several weeks - Hemoglobin on admission noted at 5.2. - Anemia panel obtained with a iron  level of 271, TIBC of 316, folate of 5.1, ferritin of 16. -Status posttransfusion 4 units PRBCs. -Hemoglobin currently at 11.3 this morning. -Continue folic acid  1 mg daily. -Patient seen in consultation by GI and patient underwent upper endoscopy 01/26/2024 which showed LA grade C reflux esophagitis with no bleeding, gastritis which was biopsied, normal examined duodenum. -Continue Carafate slurry 4 times daily, IV PPI every 12 hours and transition to oral PPI tomorrow per GI. -Diet advanced to a soft diet. - Follow H&H. - Transfusion threshold hemoglobin < 8. - resume Eliquis  on 01/28/2024 per GI recommendations. -Outpatient follow-up with GI, Dr. Dianna, in 6 to 8 weeks.  Persistent A-fib -Currently rate controlled. - Per Dr. Sebastian: Continue to hold Eliquis  until 01/28/2024 as these were GI recommendations.   Acute on chronic diastolic CHF -Patient with some bibasilar crackles noted on examination. - Patient with lower extremity edema likely in part secondary to an acute cellulitis. -2D echo (07/18/2023) with a EF of 60 to 65%,NWMA, moderately dilated  left atrial size. - proBNP elevated at 3354.  - Patient noted to be on Demadex  prior to admission currently on hold. - Changed back to home Demadex  from IV Lasix  -  Strict I's and O's, daily weights.   Diabetes mellitus type 2 -Hemoglobin A1c 7.0. -SSI only for now due to hypoglycemia   Left lower extremity pain -Patient with complaints of left lower extremity pain with difficulty ambulating. - X-ray unrevealing - CT scan shows some subcutaneous edema, suspect related to heart failure so we will continue IV Lasix -doubt any infection as white blood cell count has normalized and patient is afebrile - Has already had duplex of lower extremities without any clots seen   CKD stage IV -Stable. - Currently at baseline.   Hypertension -.  Continue home regimen Norvasc .   - Continue IV Lasix  secondary to CHF.-Changed to oral  Hyperlipidemia - Statin.     GERD - Continue IV PPI.   Pressure injury, POA -Wound care: Wound 01/24/24 1600 Pressure Injury Heel Right Deep Tissue Pressure Injury - Purple or maroon localized area of discolored intact skin or blood-filled blister due to damage of underlying soft tissue from pressure and/or shear. (Active)     Wound 01/24/24 1700 Pressure Injury Ischial tuberosity Right Stage 2 -  Partial thickness loss of dermis presenting as a shallow open injury with a red, pink wound bed without slough. (Active)       DVT prophylaxis: SCDs Code Status: Full Family Communication: Updated patient.  No family at bedside. Disposition: SNF    Consultants:  Gastroenterology: Dr. Dianna 01/25/2024  Procedures:  Transfusion 4 units PRBCs : Chest x-ray 01/24/2024 Plain film of bilateral feet 01/24/2024 Plain films of bilateral tib-fib 01/24/2024 Lower extremity Dopplers 01/25/2024    Subjective: Leg pain is better but not resolved  Objective: Vitals:   01/29/24 1331 01/29/24 1942 01/29/24 2108 01/30/24 0538  BP: (!) 131/58  120/64 110/64  Pulse: 63  76 81  Resp: 16  16 18   Temp: 98.8 F (37.1 C)  98.9 F (37.2 C) 98.2 F (36.8 C)  TempSrc: Oral  Oral Oral  SpO2: 94% 92% 91% 92%  Weight:      Height:         Intake/Output Summary (Last 24 hours) at 01/30/2024 1033 Last data filed at 01/29/2024 1900 Gross per 24 hour  Intake --  Output 400 ml  Net -400 ml   Filed Weights   01/26/24 1222 01/27/24 0500 01/29/24 0500  Weight: 89.8 kg 86.9 kg 82 kg    Examination:   General: Appearance:    Obese female in no acute distress     Lungs:      respirations unlabored  Heart:    Normal heart rate. irr   MS:   All extremities are intact.   Neurologic:   Awake, alert, oriented x 3. No apparent focal neurological           defect.        Data Reviewed: I have personally reviewed following labs and imaging studies  CBC: Recent Labs  Lab 01/24/24 1032 01/25/24 0446 01/26/24 0501 01/27/24 0556 01/28/24 0502 01/29/24 0502 01/30/24 0451  WBC 14.9*   < > 14.9* 12.0* 9.3 9.6 8.4  NEUTROABS 13.7*  --  12.7* 10.0* 7.3  --   --   HGB 5.2*   < > 10.5* 11.3* 10.4* 9.8* 9.7*  HCT 19.2*   < > 34.3* 36.6 33.7* 32.5* 33.5*  MCV 85.7   < >  83.3 84.3 85.3 86.9 88.2  PLT 323   < > 335 326 296 287 270   < > = values in this interval not displayed.    Basic Metabolic Panel: Recent Labs  Lab 01/26/24 0501 01/27/24 0556 01/28/24 0502 01/29/24 0502 01/30/24 0451  NA 135 138 139 139 139  K 3.8 3.6 3.8 4.1 4.0  CL 100 101 101 101 99  CO2 21* 22 26 26 26   GLUCOSE 168* 116* 126* 150* 149*  BUN 51* 49* 47* 45* 47*  CREATININE 2.13* 1.99* 2.19* 2.26* 2.50*  CALCIUM  9.4 9.5 9.2 9.1 9.2  MG 2.1 2.0 1.8  --   --     GFR: Estimated Creatinine Clearance: 21.6 mL/min (A) (by C-G formula based on SCr of 2.5 mg/dL (H)).  Liver Function Tests: Recent Labs  Lab 01/24/24 1032  AST 12*  ALT 13  ALKPHOS 108  BILITOT 0.7  PROT 5.6*  ALBUMIN 3.3*    CBG: Recent Labs  Lab 01/29/24 0726 01/29/24 1122 01/29/24 1652 01/29/24 2104 01/30/24 0753  GLUCAP 166* 166* 166* 131* 163*     Recent Results (from the past 240 hours)  Culture, blood (routine x 2)     Status: None   Collection Time:  01/24/24 11:14 AM   Specimen: BLOOD LEFT FOREARM  Result Value Ref Range Status   Specimen Description   Final    BLOOD LEFT FOREARM Performed at [facility], [address], [place], [place] [address]    Special Requests   Final    BOTTLES DRAWN AEROBIC AND ANAEROBIC Blood Culture adequate volume Performed at [facility], [address], [place], [place] [address]    Culture   Final    NO GROWTH 5 DAYS Performed at [facility], [address], [place], [place] [address]    Report Status 01/30/2024 FINAL  Final  Culture, blood (routine x 2)     Status: None   Collection Time: 01/24/24 11:35 AM   Specimen: BLOOD  Result Value Ref Range Status   Specimen Description   Final    BLOOD RIGHT ANTECUBITAL Performed at [facility], [address] [place], [place] [address]    Special Requests   Final    BOTTLES DRAWN AEROBIC AND ANAEROBIC Blood Culture results may not be optimal due to an inadequate volume of blood received in culture bottles Performed at [facility], [address], [place], [place] [address]    Culture   Final    NO GROWTH 5 DAYS Performed at [facility], [address] [place], [place] [address]    Report Status 01/29/2024 FINAL  Final         Radiology Studies: CT HIP LEFT WO CONTRAST Result Date: 01/28/2024 EXAM: CT OF THE LEFT HIP WITHOUT IV CONTRAST 01/28/2024 05:58:00 PM TECHNIQUE: CT of the left hip was performed without the administration of intravenous contrast. Multiplanar reformatted images are provided for review. Automated exposure control, iterative reconstruction, and/or weight based adjustment of the mA/kV was utilized to reduce the radiation dose to as low as reasonably achievable. COMPARISON: Femur radiographs 01/27/2024 and CT pelvis 09/27/2023. CLINICAL HISTORY: Chronic hip pain, no prior imaging. FINDINGS: BONES: No acute fracture or dislocation. No left  hemipelvic fracture. No aggressive appearing osseous abnormality or periostitis. Mild sclerosis along the sacral side of the SI joint with mild degenerative SI joint arthropathy on the left. Occlusion of the lower lumbar spine  demonstrates moderate bilateral foraminal impingement at L4-L5 and L5-S1 due to degenerative disc disease and spondylosis. There is also borderline central stenosis at L5-S1. SOFT TISSUE: Regional muscular atrophy likely from sarcopenia. This is more pronounced in the gluteus minimus muscle. Subcutaneous edema lateral to the left hip and tracking down the lateral left thigh, and also along the medial left thigh. Mild nonspecific presacral edema. No soft tissue mass. No compelling findings of regional bursitis. JOINT: Mild degenerative spurring of the left femoral head and acetabulum with mild craniocaudad and axial degenerative chondral thinning. No hip joint effusion. No osseous erosions. INTRAPELVIC CONTENTS: Systemic atherosclerosis is present, including the aorta and iliac arteries. Limited images of the intrapelvic contents are otherwise unremarkable. IMPRESSION: 1. Moderate bilateral foraminal impingement at L4-5 and L5-S1 due to degenerative disc disease and spondylosis, with borderline central stenosis at L5-S1. 2. Mild degenerative changes in the left hip joint. 3. Mild degenerative left sacroiliac joint arthropathy. 4. Subcutaneous edema along the lateral and medial left thigh. 5. Regional muscular atrophy, more pronounced in the gluteus minimus. 6. Systemic atherosclerosis involving the aorta and iliac arteries. 7. Mild nonspecific presacral edema. 8. No acute osseous abnormality. Electronically signed by: Ryan Salvage MD 01/28/2024 06:12 PM EDT RP Workstation: [id number]        Scheduled Meds:  allopurinol   100 mg Oral Daily   amLODipine   5 mg Oral q morning   apixaban   5 mg Oral BID   atorvastatin   80 mg Oral QHS   budesonide -glycopyrrolate -formoterol   2 puff  Inhalation BID   diclofenac  Sodium  2 g Topical QID   folic acid   1 mg Oral Daily   Gerhardt's butt cream   Topical BID   insulin  aspart  0-15 Units Subcutaneous TID WC   insulin  aspart  0-5 Units Subcutaneous QHS   linezolid  600 mg Oral Q12H   loratadine   10 mg Oral Daily   pantoprazole   40 mg Oral BID   sucralfate  1 g Oral TID WC & HS   torsemide   60 mg Oral Daily   Continuous Infusions:     LOS: 6 days    Time spent: 40 minutes    Jamarius Saha U Wendall Isabell, DO [facility]   To contact the attending provider between 7A-7P or the covering provider during after hours 7P-7A, please log into the web site [email or web address] and access using universal Hilda password for that web site. If you do not have the password, please call the hospital operator.  01/30/2024, 10:33 AM

## 2024-01-31 DIAGNOSIS — A419 Sepsis, unspecified organism: Secondary | ICD-10-CM | POA: Diagnosis not present

## 2024-01-31 DIAGNOSIS — L0390 Cellulitis, unspecified: Secondary | ICD-10-CM | POA: Diagnosis not present

## 2024-01-31 LAB — BASIC METABOLIC PANEL WITH GFR
Anion gap: 11 (ref 5–15)
BUN: 53 mg/dL — ABNORMAL HIGH (ref 8–23)
CO2: 28 mmol/L (ref 22–32)
Calcium: 9.2 mg/dL (ref 8.9–10.3)
Chloride: 97 mmol/L — ABNORMAL LOW (ref 98–111)
Creatinine, Ser: 2.65 mg/dL — ABNORMAL HIGH (ref 0.44–1.00)
GFR, Estimated: 18 mL/min — ABNORMAL LOW (ref >60)
Glucose, Bld: 196 mg/dL — ABNORMAL HIGH (ref 70–99)
Potassium: 4.7 mmol/L (ref 3.5–5.1)
Sodium: 137 mmol/L (ref 135–145)

## 2024-01-31 LAB — GLUCOSE, CAPILLARY
Glucose-Capillary: 154 mg/dL — ABNORMAL HIGH (ref 70–99)
Glucose-Capillary: 177 mg/dL — ABNORMAL HIGH (ref 70–99)
Glucose-Capillary: 194 mg/dL — ABNORMAL HIGH (ref 70–99)
Glucose-Capillary: 176 mg/dL — ABNORMAL HIGH (ref 70–99)

## 2024-01-31 NOTE — Plan of Care (Signed)

## 2024-01-31 NOTE — Plan of Care (Signed)
  Problem: Clinical Measurements: Goal: Cardiovascular complication will be avoided Outcome: Progressing   Problem: Activity: Goal: Risk for activity intolerance will decrease Outcome: Progressing   Problem: Coping: Goal: Level of anxiety will decrease Outcome: Progressing   Problem: Elimination: Goal: Will not experience complications related to urinary retention Outcome: Progressing   Problem: Pain Managment: Goal: General experience of comfort will improve and/or be controlled Outcome: Progressing   Problem: Skin Integrity: Goal: Risk for impaired skin integrity will decrease Outcome: Progressing

## 2024-01-31 NOTE — TOC Progression Note (Signed)
 Transition of Care [facility]) - Progression Note    Patient Details  Name: Paula Wheeler MRN: [id number] Date of Birth: 02-04-1951  Transition [facility] Care [facility]) CM/SW Contact  Sonda Manuella Quill, RN Phone Number: 01/31/2024, 9:55 AM  Clinical Narrative:    [name] w/ Christine at [facility]; she said ins auth not yet received; she will notify IP CM if ins shara is received this weekend; Dr Juvenal notified via secure chat.   Expected Discharge Plan: Skilled Nursing Facility Barriers to Discharge: Insurance Authorization               Expected Discharge Plan and Services In-house Referral: Clinical Social Work   Post Acute Care Choice: Skilled Nursing Facility Living arrangements for the past 2 months: Single Family Home                 DME Arranged: N/A DME Agency: NA                   Social Drivers of Health (SDOH) Interventions SDOH Screenings   Food Insecurity: No Food Insecurity (01/24/2024)  Housing: Low Risk  (01/24/2024)  Transportation Needs: No Transportation Needs (01/24/2024)  Utilities: Not At Risk (01/24/2024)  Social Connections: Socially Isolated (01/24/2024)  Tobacco Use: High Risk (01/26/2024)    Readmission Risk Interventions    01/26/2024    8:31 AM  Readmission Risk Prevention Plan  Transportation Screening Complete  HRI or Home Care Consult Complete  Social Work Consult for Recovery Care Planning/Counseling Complete  Palliative Care Screening Not Applicable  Medication Review Oceanographer) Complete

## 2024-01-31 NOTE — Progress Notes (Signed)
 PROGRESS NOTE    Paula Wheeler  FMW:[id number] DOB: Jun 14, 1950 DOA: 01/24/2024 PCP: Okey Carlin Redbird, MD    Chief Complaint  Patient presents with   Skin Problem    Brief Narrative:  Patient 73 year old female history of CKD stage IV, history of prior upper GI bleed, persistent A-fib on Eliquis , type 2 diabetes presented to the hospital with sepsis secondary to bilateral lower extremity cellulitis and also noted to have acute on chronic anemia with hemoglobin as low as 5.2.  Status posttransfusion 4 units PRBCs. GI consulted.  Patient status post EGD 01/26/2024 which was consistent with erosive esophagitis and gastritis.  Patient being diuresed for an acute CHF exacerbation and also on antibiotics for cellulitis.  Await SNF placement.   Assessment & Plan:   Principal Problem:   Sepsis due to cellulitis [facility]) Active Problems:   Symptomatic anemia   Diabetes mellitus with complication (HCC)   Essential hypertension   HLD (hyperlipidemia)   Persistent atrial fibrillation   PAD (peripheral artery disease)   Type II diabetes mellitus with renal manifestations (HCC)   GERD (gastroesophageal reflux disease)   Depression   CKD (chronic kidney disease) stage 4, GFR 15-29 ml/min (HCC)   Chronic diastolic CHF (congestive heart failure) (HCC)   Pressure injury of skin   Anemia, iron  deficiency   Acute on chronic diastolic congestive heart failure (HCC)   severe sepsis secondary to bilateral lower extremity cellulitis - Patient met criteria for sepsis on presentation due to tachycardia, leukocytosis, initial lactic level of 3.4. - Patient noted to have been on doxycycline  outpatient send in the past however no significant improvement. - Patient given a bolus of IV fluids on admission. -Plain films of the lower extremities negative for any acute fracture however consistent with a cellulitis. -Lower extremity Dopplers negative for DVT. -Blood cultures NGTS - Patient on IV vancomycin   and will transition to Zyvox to complete a 7-day course of antibiotic treatment.  - Supportive care.   Acute on chronic anemia/iron  deficiency anemia/symptomatic anemia/folate deficiency/erosive esophagitis and gastritis.  EGD of 01/26/2024 -Patient with a history of persistent A-fib on chronic anticoagulation with Eliquis . - Patient states was initially on oral iron  replacement however that got discontinued per MD while she was on antibiotics in July and never resumed. - Patient denies any overt GI bleed, no melena, no hematemesis, no hematochezia. - Patient does endorse fatigue for several weeks - Hemoglobin on admission noted at 5.2. - Anemia panel obtained with a iron  level of 271, TIBC of 316, folate of 5.1, ferritin of 16. -Status posttransfusion 4 units PRBCs. -Hemoglobin currently at 11.3 this morning. -Continue folic acid  1 mg daily. -Patient seen in consultation by GI and patient underwent upper endoscopy 01/26/2024 which showed LA grade C reflux esophagitis with no bleeding, gastritis which was biopsied, normal examined duodenum. -Continue Carafate slurry 4 times daily, IV PPI every 12 hours and transition to oral PPI tomorrow per GI. -Diet advanced to a soft diet. - Follow H&H. - Transfusion threshold hemoglobin < 8. - resume Eliquis  on 01/28/2024 per GI recommendations. -Outpatient follow-up with GI, Dr. Dianna, in 6 to 8 weeks.  Persistent A-fib -Currently rate controlled. - Per Dr. Sebastian: Continue to hold Eliquis  until 01/28/2024 as these were GI recommendations.   Acute on chronic diastolic CHF -Patient with some bibasilar crackles noted on examination. - Patient with lower extremity edema likely in part secondary to an acute cellulitis. -2D echo (07/18/2023) with a EF of 60 to 65%,NWMA, moderately dilated  left atrial size. - proBNP elevated at 3354.  - Patient noted to be on Demadex  prior to admission currently on hold. - Changed back to home Demadex  from IV Lasix  -  Strict I's and O's, daily weights.   Diabetes mellitus type 2 -Hemoglobin A1c 7.0. -SSI only for now due to hypoglycemia   Left lower extremity pain -Patient with complaints of left lower extremity pain with difficulty ambulating. - X-ray unrevealing - CT scan shows some subcutaneous edema, suspect related to heart failure so we will continue IV Lasix -doubt any infection as white blood cell count has normalized and patient is afebrile - Has already had duplex of lower extremities without any clots seen   CKD stage IV -Stable. - Currently at baseline.   Hypertension -.  Continue home regimen Norvasc .   - Continue IV Lasix  secondary to CHF.-Changed to oral  Hyperlipidemia - Statin.     GERD - Continue IV PPI.   Pressure injury, POA -Wound care: Wound 01/24/24 1600 Pressure Injury Heel Right Deep Tissue Pressure Injury - Purple or maroon localized area of discolored intact skin or blood-filled blister due to damage of underlying soft tissue from pressure and/or shear. (Active)     Wound 01/24/24 1700 Pressure Injury Ischial tuberosity Right Stage 2 -  Partial thickness loss of dermis presenting as a shallow open injury with a red, pink wound bed without slough. (Active)       DVT prophylaxis: SCDs Code Status: Full Family Communication: Updated patient.  No family at bedside. Disposition: SNF    Consultants:  Gastroenterology: Dr. Dianna 01/25/2024  Procedures:  Transfusion 4 units PRBCs : Chest x-ray 01/24/2024 Plain film of bilateral feet 01/24/2024 Plain films of bilateral tib-fib 01/24/2024 Lower extremity Dopplers 01/25/2024    Subjective: Leg pain continues to get better  Objective: Vitals:   01/31/24 0530 01/31/24 0600 01/31/24 0846 01/31/24 1228  BP:    (!) 118/56  Pulse:    63  Resp:    18  Temp:    98.8 F (37.1 C)  TempSrc:    Oral  SpO2:  96% 97%   Weight: 80.7 kg     Height:        Intake/Output Summary (Last 24 hours) at 01/31/2024  1433 Last data filed at 01/31/2024 0858 Gross per 24 hour  Intake 100 ml  Output 400 ml  Net -300 ml   Filed Weights   01/27/24 0500 01/29/24 0500 01/31/24 0530  Weight: 86.9 kg 82 kg 80.7 kg    Examination:   General: Appearance:    Obese female in no acute distress     Lungs:      respirations unlabored  Heart:    Normal heart rate. irr   MS:   All extremities are intact.   Neurologic:   Awake, alert, oriented x 3. No apparent focal neurological           defect.        Data Reviewed: I have personally reviewed following labs and imaging studies  CBC: Recent Labs  Lab 01/26/24 0501 01/27/24 0556 01/28/24 0502 01/29/24 0502 01/30/24 0451  WBC 14.9* 12.0* 9.3 9.6 8.4  NEUTROABS 12.7* 10.0* 7.3  --   --   HGB 10.5* 11.3* 10.4* 9.8* 9.7*  HCT 34.3* 36.6 33.7* 32.5* 33.5*  MCV 83.3 84.3 85.3 86.9 88.2  PLT 335 326 296 287 270    Basic Metabolic Panel: Recent Labs  Lab 01/26/24 0501 01/27/24 0556 01/28/24 0502 01/29/24 0502 01/30/24  0451 01/31/24 1217  NA 135 138 139 139 139 137  K 3.8 3.6 3.8 4.1 4.0 4.7  CL 100 101 101 101 99 97*  CO2 21* 22 26 26 26 28   GLUCOSE 168* 116* 126* 150* 149* 196*  BUN 51* 49* 47* 45* 47* 53*  CREATININE 2.13* 1.99* 2.19* 2.26* 2.50* 2.65*  CALCIUM  9.4 9.5 9.2 9.1 9.2 9.2  MG 2.1 2.0 1.8  --   --   --     GFR: Estimated Creatinine Clearance: 20.3 mL/min (A) (by C-G formula based on SCr of 2.65 mg/dL (H)).  Liver Function Tests: No results for input(s): AST, ALT, ALKPHOS, BILITOT, PROT, ALBUMIN in the last 168 hours.   CBG: Recent Labs  Lab 01/30/24 1154 01/30/24 1628 01/30/24 2137 01/31/24 0726 01/31/24 1208  GLUCAP 166* 183* 167* 154* 177*     Recent Results (from the past 240 hours)  Culture, blood (routine x 2)     Status: None   Collection Time: 01/24/24 11:14 AM   Specimen: BLOOD LEFT FOREARM  Result Value Ref Range Status   Specimen Description   Final    BLOOD LEFT FOREARM Performed at  [facility], [address], [place], [place] [address]    Special Requests   Final    BOTTLES DRAWN AEROBIC AND ANAEROBIC Blood Culture adequate volume Performed at [facility], [address] [place], [place] [address]    Culture   Final    NO GROWTH 5 DAYS Performed at [facility], [address] [place], [place] [address]    Report Status 01/30/2024 FINAL  Final  Culture, blood (routine x 2)     Status: None   Collection Time: 01/24/24 11:35 AM   Specimen: BLOOD  Result Value Ref Range Status   Specimen Description   Final    BLOOD RIGHT ANTECUBITAL Performed at [facility], [address] [place], [place] [address]    Special Requests   Final    BOTTLES DRAWN AEROBIC AND ANAEROBIC Blood Culture results may not be optimal due to an inadequate volume of blood received in culture bottles Performed at [facility], [address] [place], [place] [address]    Culture   Final    NO GROWTH 5 DAYS Performed at [facility], [address] [place], [place] [address]    Report Status 01/29/2024 FINAL  Final         Radiology Studies: No results found.       Scheduled Meds:  allopurinol   100 mg Oral Daily   amLODipine   5 mg Oral q morning   apixaban   5 mg Oral BID   atorvastatin   80 mg Oral QHS   budesonide -glycopyrrolate -formoterol   2 puff Inhalation BID   diclofenac  Sodium  2 g Topical QID   folic acid   1 mg Oral Daily   Gerhardt's butt cream   Topical BID   insulin  aspart  0-15 Units Subcutaneous TID WC   insulin  aspart  0-5 Units Subcutaneous QHS   loratadine   10 mg Oral Daily   pantoprazole   40 mg Oral BID   sucralfate  1 g Oral TID WC & HS   torsemide   60 mg Oral Daily   Continuous Infusions:     LOS: 7 days    Time spent: 40 minutes    Harlene RAYMOND Bowl, DO [facility]   To contact the attending provider between 7A-7P or the  covering provider during after  hours 7P-7A, please log into the web site [email or web address] and access using universal [place] password for that web site. If you do not have the password, please call the hospital operator.  01/31/2024, 2:33 PM

## 2024-01-31 NOTE — Progress Notes (Signed)
   01/31/24 0506 01/31/24 0528 01/31/24 0600  Assess: MEWS Score  SpO2 (!) 89 % (NP notified) 93 % 96 %  O2 Device Room Air Room Air (while awake and alert) Room Air    O2 sat 89% RA upon waking up for vital signs. While fully awake patient's O2 sat increased to 93-96% on RA. As soon as the patient would drift back to sleep, O2 sat decreased to 89% on RA.   RN set up O2 in patient's room. Patient now fully awake and O2 sat 96% while RN changing BLE dressings. No O2 placed on patient as the patient is now fully awake and alert.  RN notified Lavanda Horns, NP of the above information

## 2024-02-01 DIAGNOSIS — A419 Sepsis, unspecified organism: Secondary | ICD-10-CM | POA: Diagnosis not present

## 2024-02-01 DIAGNOSIS — L0390 Cellulitis, unspecified: Secondary | ICD-10-CM | POA: Diagnosis not present

## 2024-02-01 LAB — BASIC METABOLIC PANEL WITH GFR
Anion gap: 12 (ref 5–15)
BUN: 53 mg/dL — ABNORMAL HIGH (ref 8–23)
CO2: 27 mmol/L (ref 22–32)
Calcium: 9.2 mg/dL (ref 8.9–10.3)
Chloride: 98 mmol/L (ref 98–111)
Creatinine, Ser: 2.45 mg/dL — ABNORMAL HIGH (ref 0.44–1.00)
GFR, Estimated: 20 mL/min — ABNORMAL LOW (ref >60)
Glucose, Bld: 178 mg/dL — ABNORMAL HIGH (ref 70–99)
Potassium: 4 mmol/L (ref 3.5–5.1)
Sodium: 136 mmol/L (ref 135–145)

## 2024-02-01 LAB — GLUCOSE, CAPILLARY
Glucose-Capillary: 182 mg/dL — ABNORMAL HIGH (ref 70–99)
Glucose-Capillary: 166 mg/dL — ABNORMAL HIGH (ref 70–99)
Glucose-Capillary: 165 mg/dL — ABNORMAL HIGH (ref 70–99)
Glucose-Capillary: 148 mg/dL — ABNORMAL HIGH (ref 70–99)

## 2024-02-01 MED ORDER — LINEZOLID 600 MG PO TABS
600.0000 mg | ORAL_TABLET | Freq: Two times a day (BID) | ORAL | Status: AC
  Administered 2024-02-01 – 2024-02-03 (×6): 600 mg via ORAL
  Filled 2024-02-01 (×6): qty 1

## 2024-02-01 NOTE — Progress Notes (Signed)
 PROGRESS NOTE    Paula Wheeler  FMW:[id number] DOB: June 01, 1950 DOA: 01/24/2024 PCP: Okey Carlin Redbird, MD    Chief Complaint  Patient presents with   Skin Problem    Brief Narrative:  Patient 73 year old female history of CKD stage IV, history of prior upper GI bleed, persistent A-fib on Eliquis , type 2 diabetes presented to the hospital with sepsis secondary to bilateral lower extremity cellulitis and also noted to have acute on chronic anemia with hemoglobin as low as 5.2.  Status posttransfusion 4 units PRBCs. GI consulted.  Patient status post EGD 01/26/2024 which was consistent with erosive esophagitis and gastritis.  Patient being diuresed for an acute CHF exacerbation and also on antibiotics for cellulitis.  Await SNF placement.   Assessment & Plan:   Principal Problem:   Sepsis due to cellulitis [facility]) Active Problems:   Symptomatic anemia   Diabetes mellitus with complication (HCC)   Essential hypertension   HLD (hyperlipidemia)   Persistent atrial fibrillation   PAD (peripheral artery disease)   Type II diabetes mellitus with renal manifestations (HCC)   GERD (gastroesophageal reflux disease)   Depression   CKD (chronic kidney disease) stage 4, GFR 15-29 ml/min (HCC)   Chronic diastolic CHF (congestive heart failure) (HCC)   Pressure injury of skin   Anemia, iron  deficiency   Acute on chronic diastolic congestive heart failure (HCC)   severe sepsis secondary to bilateral lower extremity cellulitis - Patient met criteria for sepsis on presentation due to tachycardia, leukocytosis, initial lactic level of 3.4. - Patient noted to have been on doxycycline  outpatient send in the past however no significant improvement. - Patient given a bolus of IV fluids on admission. -Plain films of the lower extremities negative for any acute fracture however consistent with a cellulitis. -Lower extremity Dopplers negative for DVT. -Blood cultures NGTS - Patient on IV vancomycin   and will transition to Zyvox to complete a 7-day course  - Supportive care.   Acute on chronic anemia/iron  deficiency anemia/symptomatic anemia/folate deficiency/erosive esophagitis and gastritis.  EGD of 01/26/2024 -Patient with a history of persistent A-fib on chronic anticoagulation with Eliquis . - Patient states was initially on oral iron  replacement however that got discontinued per MD while she was on antibiotics in July and never resumed. - Patient denies any overt GI bleed, no melena, no hematemesis, no hematochezia. - Patient does endorse fatigue for several weeks - Hemoglobin on admission noted at 5.2. - Anemia panel obtained with a iron  level of 271, TIBC of 316, folate of 5.1, ferritin of 16. -Status posttransfusion 4 units PRBCs. -Hemoglobin currently at 11.3 this morning. -Continue folic acid  1 mg daily. -Patient seen in consultation by GI and patient underwent upper endoscopy 01/26/2024 which showed LA grade C reflux esophagitis with no bleeding, gastritis which was biopsied, normal examined duodenum. -Continue Carafate slurry 4 times daily, IV PPI every 12 hours and transition to oral PPI  -Diet advanced to a soft diet. - Follow H&H. - Transfusion threshold hemoglobin < 8. - resume Eliquis  on 01/28/2024 per GI recommendations. -Outpatient follow-up with GI, Dr. Dianna, in 6 to 8 weeks.  Persistent A-fib -Currently rate controlled. - Per Dr. Sebastian: Continue to hold Eliquis  until 01/28/2024 as these were GI recommendations.   Acute on chronic diastolic CHF -Patient with some bibasilar crackles noted on examination. - Patient with lower extremity edema likely in part secondary to an acute cellulitis. -2D echo (07/18/2023) with a EF of 60 to 65%,NWMA, moderately dilated left atrial size. - proBNP  elevated at 3354.  - Patient noted to be on Demadex  prior to admission currently on hold. - Changed back to home Demadex  from IV Lasix  - Strict I's and O's, daily weights.    Diabetes mellitus type 2 -Hemoglobin A1c 7.0. -SSI only for now due to hypoglycemia   Left lower extremity pain -Patient with complaints of left lower extremity pain with difficulty ambulating. - X-ray unrevealing - CT scan shows some subcutaneous edema, suspect related to heart failure so we will continue IV Lasix -doubt any infection as white blood cell count has normalized and patient is afebrile - Has already had duplex of lower extremities without any clots seen   CKD stage IV -Stable. - Currently at baseline.   Hypertension -.  Continue home regimen Norvasc .   - Continue IV Lasix  secondary to CHF.-Changed to oral  Hyperlipidemia - Statin.     GERD - Continue IV PPI.   Pressure injury, POA -Wound care: Wound 01/24/24 1600 Pressure Injury Heel Right Deep Tissue Pressure Injury - Purple or maroon localized area of discolored intact skin or blood-filled blister due to damage of underlying soft tissue from pressure and/or shear. (Active)     Wound 01/24/24 1700 Pressure Injury Ischial tuberosity Right Stage 2 -  Partial thickness loss of dermis presenting as a shallow open injury with a red, pink wound bed without slough. (Active)       DVT prophylaxis: SCDs Code Status: Full Family Communication: Updated patient.  No family at bedside. Disposition: SNF    Consultants:  Gastroenterology: Dr. Dianna 01/25/2024  Procedures:  Transfusion 4 units PRBCs : Chest x-ray 01/24/2024 Plain film of bilateral feet 01/24/2024 Plain films of bilateral tib-fib 01/24/2024 Lower extremity Dopplers 01/25/2024    Subjective: No noticeable swelling per patient but continues with some leg pain relieved by pain medicine  Objective: Vitals:   01/31/24 2056 02/01/24 0500 02/01/24 0537 02/01/24 0922  BP: 114/63  (!) 123/58   Pulse: 70  86   Resp: 17  18   Temp: 98.6 F (37 C)  99 F (37.2 C)   TempSrc:   Oral   SpO2: 94%   95%  Weight:  81.6 kg    Height:        Intake/Output  Summary (Last 24 hours) at 02/01/2024 1127 Last data filed at 01/31/2024 2045 Gross per 24 hour  Intake 540 ml  Output 375 ml  Net 165 ml   Filed Weights   01/29/24 0500 01/31/24 0530 02/01/24 0500  Weight: 82 kg 80.7 kg 81.6 kg    Examination:   General: Appearance:    Obese female in no acute distress     Lungs:      respirations unlabored  Heart:    Normal heart rate. irr   MS:   All extremities are intact.   Neurologic:   Awake, alert       Data Reviewed: I have personally reviewed following labs and imaging studies  CBC: Recent Labs  Lab 01/26/24 0501 01/27/24 0556 01/28/24 0502 01/29/24 0502 01/30/24 0451  WBC 14.9* 12.0* 9.3 9.6 8.4  NEUTROABS 12.7* 10.0* 7.3  --   --   HGB 10.5* 11.3* 10.4* 9.8* 9.7*  HCT 34.3* 36.6 33.7* 32.5* 33.5*  MCV 83.3 84.3 85.3 86.9 88.2  PLT 335 326 296 287 270    Basic Metabolic Panel: Recent Labs  Lab 01/26/24 0501 01/27/24 0556 01/28/24 0502 01/29/24 0502 01/30/24 0451 01/31/24 1217 02/01/24 0528  NA 135 138 139 139 139  137 136  K 3.8 3.6 3.8 4.1 4.0 4.7 4.0  CL 100 101 101 101 99 97* 98  CO2 21* 22 26 26 26 28 27   GLUCOSE 168* 116* 126* 150* 149* 196* 178*  BUN 51* 49* 47* 45* 47* 53* 53*  CREATININE 2.13* 1.99* 2.19* 2.26* 2.50* 2.65* 2.45*  CALCIUM  9.4 9.5 9.2 9.1 9.2 9.2 9.2  MG 2.1 2.0 1.8  --   --   --   --     GFR: Estimated Creatinine Clearance: 22 mL/min (A) (by C-G formula based on SCr of 2.45 mg/dL (H)).  Liver Function Tests: No results for input(s): AST, ALT, ALKPHOS, BILITOT, PROT, ALBUMIN in the last 168 hours.   CBG: Recent Labs  Lab 01/31/24 0726 01/31/24 1208 01/31/24 1715 01/31/24 2212 02/01/24 0743  GLUCAP 154* 177* 194* 176* 182*     Recent Results (from the past 240 hours)  Culture, blood (routine x 2)     Status: None   Collection Time: 01/24/24 11:14 AM   Specimen: BLOOD LEFT FOREARM  Result Value Ref Range Status   Specimen Description   Final    BLOOD LEFT  FOREARM Performed at [facility], [address] [place], [place] [address]    Special Requests   Final    BOTTLES DRAWN AEROBIC AND ANAEROBIC Blood Culture adequate volume Performed at [facility], [address], [place], [place] [address]    Culture   Final    NO GROWTH 5 DAYS Performed at [facility], [address], [place], [place] [address]    Report Status 01/30/2024 FINAL  Final  Culture, blood (routine x 2)     Status: None   Collection Time: 01/24/24 11:35 AM   Specimen: BLOOD  Result Value Ref Range Status   Specimen Description   Final    BLOOD RIGHT ANTECUBITAL Performed at [facility], [address], [place], [place] [address]    Special Requests   Final    BOTTLES DRAWN AEROBIC AND ANAEROBIC Blood Culture results may not be optimal due to an inadequate volume of blood received in culture bottles Performed at [facility], [address], [place], [place] [address]    Culture   Final    NO GROWTH 5 DAYS Performed at [facility], [address] [place], [place] [address]    Report Status 01/29/2024 FINAL  Final         Radiology Studies: No results found.       Scheduled Meds:  allopurinol   100 mg Oral Daily   amLODipine   5 mg Oral q morning   apixaban   5 mg Oral BID   atorvastatin   80 mg Oral QHS   budesonide -glycopyrrolate -formoterol   2 puff Inhalation BID   diclofenac  Sodium  2 g Topical QID   folic acid   1 mg Oral Daily   Gerhardt's butt cream   Topical BID   insulin  aspart  0-15 Units Subcutaneous TID WC   insulin  aspart  0-5 Units Subcutaneous QHS   linezolid  600 mg Oral Q12H   loratadine   10 mg Oral Daily   pantoprazole   40 mg Oral BID   sucralfate  1 g Oral TID WC & HS   torsemide   60 mg Oral Daily   Continuous Infusions:     LOS: 8 days    Time spent: 40 minutes    Christiona Siddique U Denicia Pagliarulo, DO [facility]  To contact the attending provider  between 7A-7P or the covering provider during after hours 7P-7A, please log into the web site [email or web address] and access using universal [place] password for that web site. If you do not have the password, please call the hospital operator.  02/01/2024, 11:27 AM

## 2024-02-01 NOTE — Progress Notes (Addendum)
 Physical Therapy Treatment Patient Details Name: Paula Wheeler MRN: [id number] DOB: [birth year] Today's Date: 02/01/2024   History of Present Illness 73 yo female admitted bil LE pain/blisters. Dx: LE cellulitis, sepsis, acute on chronic HF, anemia s/p transfusion. Hx of CKD, Afib, DM, COPD, anemia, obesity, falls.    PT Comments  Pt agreeable to working with therapy. Pre-medicated prior to session. Continues to report 10/10 pain in bilateral LEs with activity. Pt in bed with gown and linens soaked with urine-purewick malfunctioning. Mobility slowly progressing but remains limited by pain. Patient will benefit from continued inpatient follow up therapy, <3 hours/day     If plan is discharge home, recommend the following: A lot of help with walking and/or transfers;A lot of help with bathing/dressing/bathroom;Assistance with cooking/housework;Assist for transportation;Help with stairs or ramp for entrance   Can travel by private vehicle     No  Equipment Recommendations  None recommended by PT    Recommendations for Other Services       Precautions / Restrictions Precautions Precautions: Fall Precaution/Restrictions Comments: blisters, wounds LEs Restrictions Weight Bearing Restrictions Per Provider Order: No     Mobility  Bed Mobility Overal bed mobility: Needs Assistance Bed Mobility: Supine to Sit     Supine to sit: Min assist     General bed mobility comments: Increased time and effort. Cues provided.    Transfers Overall transfer level: Needs assistance Equipment used: Rolling walker (2 wheels) Transfers: Sit to/from Stand Sit to Stand: Min assist, From elevated surface           General transfer comment: Cues for safety, technique, hand placement. Assist to rise, steady, control descent. Increased time and effort.    Ambulation/Gait Ambulation/Gait assistance: Min assist Gait Distance (Feet): 4 Feet Assistive device: Rolling walker (2 wheels) Gait  Pattern/deviations: Step-to pattern       General Gait Details: Cues for safety, technique, sequencing. Followed closely with recliner. Assist to stabilize throughout short distance. Distance limited by pain.   Stairs             Wheelchair Mobility     Tilt Bed    Modified Rankin (Stroke Patients Only)       Balance Overall balance assessment: Needs assistance         Standing balance support: Bilateral upper extremity supported, Reliant on assistive device for balance, During functional activity Standing balance-Leahy Scale: Poor                              Communication Communication Communication: No apparent difficulties  Cognition Arousal: Alert Behavior During Therapy: WFL for tasks assessed/performed   PT - Cognitive impairments: No apparent impairments                         Following commands: Intact      Cueing Cueing Techniques: Verbal cues  Exercises      General Comments        Pertinent Vitals/Pain Pain Assessment Pain Assessment: 0-10 Pain Score: 10-Worst pain ever Pain Location: bil LEs, L>R Pain Descriptors / Indicators: Grimacing, Guarding, Tender, Throbbing, Aching, Crying Pain Intervention(s): Limited activity within patient's tolerance, Monitored during session, Repositioned, Premedicated before session    Home Living                          Prior Function  PT Goals (current goals can now be found in the care plan section) Progress towards PT goals: Progressing toward goals    Frequency    Min 2X/week      PT Plan      Co-evaluation              AM-PAC PT 6 Clicks Mobility   Outcome Measure  Help needed turning from your back to your side while in a flat bed without using bedrails?: A Little Help needed moving from lying on your back to sitting on the side of a flat bed without using bedrails?: A Little Help needed moving to and from a bed to a chair  (including a wheelchair)?: A Lot Help needed standing up from a chair using your arms (e.g., wheelchair or bedside chair)?: A Little Help needed to walk in hospital room?: A Lot Help needed climbing 3-5 steps with a railing? : Total 6 Click Score: 14    End of Session Equipment Utilized During Treatment: Gait belt Activity Tolerance: Patient limited by pain Patient left: in chair;with call bell/phone within reach RN Communication: pt requesting cream for buttocks, will need new linens on bed-sent secure chat and called RN PT Visit Diagnosis: Muscle weakness (generalized) (M62.81);Pain;Other abnormalities of gait and mobility (R26.89);Difficulty in walking, not elsewhere classified (R26.2) Pain - Right/Left:  (bil) Pain - part of body: Leg     Time: 8382-8359 PT Time Calculation (min) (ACUTE ONLY): 23 min  Charges:    $Gait Training: 8-22 mins $Therapeutic Activity: 8-22 mins PT General Charges $$ ACUTE PT VISIT: 1 Visit                        Dannial SQUIBB, PT Acute Rehabilitation  Office: [phone]

## 2024-02-01 NOTE — Plan of Care (Signed)
  Problem: Education: Goal: Knowledge of General Education information will improve Description: Including pain rating scale, medication(s)/side effects and non-pharmacologic comfort measures Outcome: Progressing   Problem: Health Behavior/Discharge Planning: Goal: Ability to manage health-related needs will improve Outcome: Progressing   Problem: Clinical Measurements: Goal: Ability to maintain clinical measurements within normal limits will improve Outcome: Progressing Goal: Will remain free from infection Outcome: Progressing Goal: Diagnostic test results will improve Outcome: Progressing Goal: Respiratory complications will improve Outcome: Progressing Goal: Cardiovascular complication will be avoided Outcome: Progressing   Problem: Activity: Goal: Risk for activity intolerance will decrease Outcome: Progressing   Problem: Nutrition: Goal: Adequate nutrition will be maintained Outcome: Progressing   Problem: Coping: Goal: Level of anxiety will decrease Outcome: Progressing   Problem: Elimination: Goal: Will not experience complications related to bowel motility Outcome: Progressing Goal: Will not experience complications related to urinary retention Outcome: Progressing   Problem: Pain Managment: Goal: General experience of comfort will improve and/or be controlled Outcome: Progressing   Problem: Safety: Goal: Ability to remain free from injury will improve Outcome: Progressing   Problem: Skin Integrity: Goal: Risk for impaired skin integrity will decrease Outcome: Progressing   Problem: Education: Goal: Ability to describe self-care measures that may prevent or decrease complications (Diabetes Survival Skills Education) will improve Outcome: Progressing Goal: Individualized Educational Video(s) Outcome: Progressing   Problem: Coping: Goal: Ability to adjust to condition or change in health will improve Outcome: Progressing   Problem: Fluid  Volume: Goal: Ability to maintain a balanced intake and output will improve Outcome: Progressing   Problem: Health Behavior/Discharge Planning: Goal: Ability to identify and utilize available resources and services will improve Outcome: Progressing Goal: Ability to manage health-related needs will improve Outcome: Progressing   Problem: Metabolic: Goal: Ability to maintain appropriate glucose levels will improve Outcome: Progressing   Problem: Nutritional: Goal: Maintenance of adequate nutrition will improve Outcome: Progressing Goal: Progress toward achieving an optimal weight will improve Outcome: Progressing   Problem: Skin Integrity: Goal: Risk for impaired skin integrity will decrease Outcome: Progressing   Problem: Tissue Perfusion: Goal: Adequacy of tissue perfusion will improve Outcome: Progressing   Problem: Clinical Measurements: Goal: Ability to avoid or minimize complications of infection will improve Outcome: Progressing   Problem: Skin Integrity: Goal: Skin integrity will improve Outcome: Progressing

## 2024-02-02 DIAGNOSIS — L0390 Cellulitis, unspecified: Secondary | ICD-10-CM | POA: Diagnosis not present

## 2024-02-02 DIAGNOSIS — A419 Sepsis, unspecified organism: Secondary | ICD-10-CM | POA: Diagnosis not present

## 2024-02-02 LAB — GLUCOSE, CAPILLARY
Glucose-Capillary: 207 mg/dL — ABNORMAL HIGH (ref 70–99)
Glucose-Capillary: 156 mg/dL — ABNORMAL HIGH (ref 70–99)
Glucose-Capillary: 167 mg/dL — ABNORMAL HIGH (ref 70–99)
Glucose-Capillary: 171 mg/dL — ABNORMAL HIGH (ref 70–99)

## 2024-02-02 MED ORDER — INSULIN NPH ISOPHANE & REGULAR (70-30) 100 UNIT/ML ~~LOC~~ SUSP: 30.0000 [IU] | Freq: Two times a day (BID) | SUBCUTANEOUS | Status: DC

## 2024-02-02 MED ORDER — SUCRALFATE 1 GM/10ML PO SUSP: 1.0000 g | Freq: Three times a day (TID) | ORAL | Status: DC

## 2024-02-02 MED ORDER — DICLOFENAC SODIUM 1 % EX GEL: 2.0000 g | Freq: Four times a day (QID) | CUTANEOUS | Status: DC

## 2024-02-02 MED ORDER — TORSEMIDE 60 MG PO TABS: 60.0000 mg | ORAL_TABLET | Freq: Every day | ORAL | Status: DC

## 2024-02-02 MED ORDER — PANTOPRAZOLE SODIUM 40 MG PO TBEC: 40.0000 mg | DELAYED_RELEASE_TABLET | Freq: Two times a day (BID) | ORAL | Status: DC

## 2024-02-02 MED ORDER — LINEZOLID 600 MG PO TABS: 600.0000 mg | ORAL_TABLET | Freq: Two times a day (BID) | ORAL | Status: DC

## 2024-02-02 MED ORDER — HYDROCODONE-ACETAMINOPHEN 5-325 MG PO TABS: 1.0000 | ORAL_TABLET | ORAL | 0 refills | Status: DC | PRN

## 2024-02-02 MED ORDER — TIZANIDINE HCL 4 MG PO TABS: 4.0000 mg | ORAL_TABLET | ORAL | 0 refills | Status: DC | PRN

## 2024-02-02 MED ORDER — METOLAZONE 2.5 MG PO TABS: 2.5000 mg | ORAL_TABLET | Freq: Every day | ORAL | Status: DC | PRN

## 2024-02-02 MED ORDER — GERHARDT'S BUTT CREAM: 1.0000 | TOPICAL_CREAM | Freq: Two times a day (BID) | CUTANEOUS | Status: DC

## 2024-02-02 MED ORDER — GABAPENTIN 100 MG PO CAPS: 100.0000 mg | ORAL_CAPSULE | Freq: Two times a day (BID) | ORAL | Status: DC

## 2024-02-02 MED ORDER — GABAPENTIN 100 MG PO CAPS
100.0000 mg | ORAL_CAPSULE | Freq: Two times a day (BID) | ORAL | Status: DC
  Administered 2024-02-02 – 2024-02-05 (×7): 100 mg via ORAL
  Filled 2024-02-02 (×7): qty 1

## 2024-02-02 NOTE — Progress Notes (Signed)
Patient is refusing wound care at this time.

## 2024-02-02 NOTE — Progress Notes (Signed)
 Patient has been in chair at bedside for the entirety of the shift.  States that she wants to stay in chair because it will cause her pain to get up and does not want to feel pain.  Also, urinary output has been very minimal.  Patient denies being wet.  When evaluated, patient spontaneously urinated into Purewick.

## 2024-02-02 NOTE — TOC Progression Note (Addendum)
 Transition of Care [facility]) - Progression Note    Patient Details  Name: Paula Wheeler MRN: [id number] Date of Birth: [birth year]  Transition of Care [facility]) CM/SW Contact  Doneta Glenys DASEN, RN Phone Number: 02/02/2024, 3:50 PM  Clinical Narrative:     CM still waiting on the insurance auth per [facility]. BC/BS is experiencing longer than usual hold/wait times. MD aware.  Expected Discharge Plan: Skilled Nursing Facility Barriers to Discharge: Insurance Authorization               Expected Discharge Plan and Services In-house Referral: Clinical Social Work   Post Acute Care Choice: Skilled Nursing Facility Living arrangements for the past 2 months: Single Family Home Expected Discharge Date: 02/02/24               DME Arranged: N/A DME Agency: NA                   Social Drivers of Health (SDOH) Interventions SDOH Screenings   Food Insecurity: No Food Insecurity (01/24/2024)  Housing: Low Risk  (01/24/2024)  Transportation Needs: No Transportation Needs (01/24/2024)  Utilities: Not At Risk (01/24/2024)  Social Connections: Socially Isolated (01/24/2024)  Tobacco Use: High Risk (01/26/2024)    Readmission Risk Interventions    01/26/2024    8:31 AM  Readmission Risk Prevention Plan  Transportation Screening Complete  HRI or Home Care Consult Complete  Social Work Consult for Recovery Care Planning/Counseling Complete  Palliative Care Screening Not Applicable  Medication Review Oceanographer) Complete

## 2024-02-02 NOTE — Plan of Care (Signed)
  Problem: Education: Goal: Knowledge of General Education information will improve Description: Including pain rating scale, medication(s)/side effects and non-pharmacologic comfort measures Outcome: Progressing   Problem: Health Behavior/Discharge Planning: Goal: Ability to manage health-related needs will improve Outcome: Progressing   Problem: Clinical Measurements: Goal: Ability to maintain clinical measurements within normal limits will improve Outcome: Progressing Goal: Will remain free from infection Outcome: Progressing Goal: Diagnostic test results will improve Outcome: Progressing Goal: Respiratory complications will improve Outcome: Progressing Goal: Cardiovascular complication will be avoided Outcome: Progressing   Problem: Activity: Goal: Risk for activity intolerance will decrease Outcome: Progressing   Problem: Nutrition: Goal: Adequate nutrition will be maintained Outcome: Progressing   Problem: Coping: Goal: Level of anxiety will decrease Outcome: Progressing   Problem: Elimination: Goal: Will not experience complications related to bowel motility Outcome: Progressing Goal: Will not experience complications related to urinary retention Outcome: Progressing   Problem: Pain Managment: Goal: General experience of comfort will improve and/or be controlled Outcome: Progressing   Problem: Safety: Goal: Ability to remain free from injury will improve Outcome: Progressing   Problem: Skin Integrity: Goal: Risk for impaired skin integrity will decrease Outcome: Progressing   Problem: Education: Goal: Ability to describe self-care measures that may prevent or decrease complications (Diabetes Survival Skills Education) will improve Outcome: Progressing Goal: Individualized Educational Video(s) Outcome: Progressing   Problem: Coping: Goal: Ability to adjust to condition or change in health will improve Outcome: Progressing   Problem: Fluid  Volume: Goal: Ability to maintain a balanced intake and output will improve Outcome: Progressing   Problem: Health Behavior/Discharge Planning: Goal: Ability to identify and utilize available resources and services will improve Outcome: Progressing Goal: Ability to manage health-related needs will improve Outcome: Progressing   Problem: Metabolic: Goal: Ability to maintain appropriate glucose levels will improve Outcome: Progressing   Problem: Nutritional: Goal: Maintenance of adequate nutrition will improve Outcome: Progressing Goal: Progress toward achieving an optimal weight will improve Outcome: Progressing   Problem: Skin Integrity: Goal: Risk for impaired skin integrity will decrease Outcome: Progressing   Problem: Tissue Perfusion: Goal: Adequacy of tissue perfusion will improve Outcome: Progressing   Problem: Clinical Measurements: Goal: Ability to avoid or minimize complications of infection will improve Outcome: Progressing   Problem: Skin Integrity: Goal: Skin integrity will improve Outcome: Progressing

## 2024-02-02 NOTE — Discharge Summary (Addendum)
 Physician Discharge Summary  Paula Wheeler FMW:[id number] DOB: 14-Mar-1951 DOA: 01/24/2024  PCP: Okey Carlin Redbird, MD  Admit date: 01/24/2024 Discharge date: 02/02/2024  Admitted From:  Discharge disposition: SNF when bed available   Recommendations for Outpatient Follow-Up:   Antibiotics through 10/8 Elevate extremities Close outpatient follow-up with nephrology for changes in diuretic management to avoid lower extremity edema Monitor blood sugars and adjust insulin  as needed -Outpatient follow-up with GI, Dr. Dianna, in 6 to 8 weeks.   Discharge Diagnosis:      Discharge Condition: Improved.  Diet recommendation: Low sodium, heart healthy/carb mod  Wound care: None.  Code status: Full.   History of Present Illness:   Paula Wheeler is a 73 y.o. female with medical history significant for CKD stage IV, prior upper GI bleed, longstanding persistent atrial fibrillation on Eliquis , type 2 diabetes being admitted to the hospital with sepsis due to bilateral lower extremity cellulitis found to have acute on chronic anemia.  Patient states that for the last couple of weeks she has been feeling fatigued, she also has chronic lower extremity mild edema and erythema which has worsened.  She recently took a course of p.o. doxycycline  to treat presumed cellulitis, however she developed worsening discomfort and blisters draining clear fluid.  She denies any fevers or chills, denies any abdominal pain, nausea or vomiting.  Denies any hematochezia, or melena states that her bowel movements have been normal.  She has continued to take her home Eliquis , the last dose was this morning about 3 AM.  Came to the emergency department for evaluation of her lower extremities, she has been started on empiric IV antibiotics to cover for cellulitis was also incidentally found to have a hemoglobin of 5.2.  2 unit blood transfusion has been ordered, and hospitalist admission was requested.     Hospital Course by Problem:   severe sepsis secondary to bilateral lower extremity cellulitis - Patient met criteria for sepsis on presentation due to tachycardia, leukocytosis, initial lactic level of 3.4. - Patient noted to have been on doxycycline  outpatient send in the past however no significant improvement. - Patient given a bolus of IV fluids on admission. -Plain films of the lower extremities negative for any acute fracture however consistent with a cellulitis. -Lower extremity Dopplers negative for DVT. -Blood cultures NGTS -  IV vancomycin  and will transition to Zyvox to complete a 7-day course  - Supportive care.    Acute on chronic anemia/iron  deficiency anemia/symptomatic anemia/folate deficiency/erosive esophagitis and gastritis.  EGD of 01/26/2024 -Status posttransfusion 4 units PRBCs. -Patient seen in consultation by GI and patient underwent upper endoscopy 01/26/2024 which showed LA grade C reflux esophagitis with no bleeding, gastritis which was biopsied, normal examined duodenum. -Continue Carafate slurry 4 times daily, oral PPI - resume Eliquis  on 01/28/2024 per GI recommendations. -Outpatient follow-up with GI, Dr. Dianna, in 6 to 8 weeks.   Persistent A-fib -Currently rate controlled. - Eliquis  resumed   Acute on chronic diastolic CHF -Patient with some bibasilar crackles noted on examination. - Patient with lower extremity edema likely in part secondary to an acute cellulitis. -2D echo (07/18/2023) with a EF of 60 to 65%,NWMA, moderately dilated left atrial size. - proBNP elevated at 3354.  - Changed back to home Demadex  from IV Lasix  - Strict I's and O's, daily weights.    Diabetes mellitus type 2 -Hemoglobin A1c 7.0. - Resume lower dose of insulin  at discharge    Left lower extremity pain -  Patient with complaints of left lower extremity pain with difficulty ambulating. - X-ray unrevealing - CT scan shows some subcutaneous edema, suspect related to  heart failure so we will continue IV Lasix -doubt any infection as white blood cell count has normalized and patient is afebrile - Has already had duplex of lower extremities without any clots seen    CKD stage IV -Stable. - Currently at baseline. -Follows with Dr. Charlanne and as an outpatient, will need close follow-up    Hypertension -.  Continue home regimen Norvasc .   - Continue IV Lasix  secondary to CHF.-Changed to oral and will need close trending and adjustments of medications   Hyperlipidemia - Statin.      GERD - PPI as above    Pressure injury, POA -Wound care: Wound 01/24/24 1600 Pressure Injury Heel Right Deep Tissue Pressure Injury - Purple or maroon localized area of discolored intact skin or blood-filled blister due to damage of underlying soft tissue from pressure and/or shear. (Active)     Wound 01/24/24 1700 Pressure Injury Ischial tuberosity Right Stage 2 -  Partial thickness loss of dermis presenting as a shallow open injury with a red, pink wound bed without slough. (Active)           Medical Consultants:   GI   Discharge Exam:   Vitals:   02/02/24 0443 02/02/24 0818  BP: (!) 138/51   Pulse: 64   Resp:    Temp: (!) 97.5 F (36.4 C)   SpO2: 98% 98%   Vitals:   02/01/24 1703 02/01/24 1954 02/02/24 0443 02/02/24 0818  BP: 139/67 (!) 119/53 (!) 138/51   Pulse: 86 (!) 57 64   Resp: 18     Temp: 97.8 F (36.6 C) 97.9 F (36.6 C) (!) 97.5 F (36.4 C)   TempSrc:      SpO2: 94% 96% 98% 98%  Weight:      Height:        General exam: Appears calm and comfortable.    The results of significant diagnostics from this hospitalization (including imaging, microbiology, ancillary and laboratory) are listed below for reference.     Procedures and Diagnostic Studies:   VAS US  LOWER EXTREMITY VENOUS (DVT) (ONLY [facility] & [facility]) Result Date: 01/25/2024  Lower Venous DVT Study Patient Name:  Paula Wheeler  Date of Exam:   01/25/2024 Medical Rec #: [id number]           Accession #:    [id number] Date of Birth: [birth year]          Patient Gender: F Patient Age:   39 years Exam Location:  [facility] Procedure:      VAS US  LOWER EXTREMITY VENOUS (DVT) Referring Phys: MAUDE MESSICK --------------------------------------------------------------------------------  Indications: Pain, Swelling, Edema, and redness and blisters on both lower extremities. Other Indications: Bilateral lower cellulitis. Comparison Study: 08/03/21 Negative LEV Performing Technologist: Ricka Sturdivant-Jones RDMS, RVT  Examination Guidelines: A complete evaluation includes B-mode imaging, spectral Doppler, color Doppler, and power Doppler as needed of all accessible portions of each vessel. Bilateral testing is considered an integral part of a complete examination. Limited examinations for reoccurring indications may be performed as noted. The reflux portion of the exam is performed with the patient in reverse Trendelenburg.  +---------+---------------+---------+-----------+----------+--------------+ RIGHT    CompressibilityPhasicitySpontaneityPropertiesThrombus Aging +---------+---------------+---------+-----------+----------+--------------+ CFV      Full           Yes      Yes                                 +---------+---------------+---------+-----------+----------+--------------+  SFJ      Full                                                        +---------+---------------+---------+-----------+----------+--------------+ FV Prox  Full                                                        +---------+---------------+---------+-----------+----------+--------------+ FV Mid   Full           Yes      Yes                                 +---------+---------------+---------+-----------+----------+--------------+ FV DistalFull                                                        +---------+---------------+---------+-----------+----------+--------------+ PFV       Full                                                        +---------+---------------+---------+-----------+----------+--------------+ POP      Full           Yes      Yes                                 +---------+---------------+---------+-----------+----------+--------------+ PTV      Full                                                        +---------+---------------+---------+-----------+----------+--------------+ PERO     Full                                                        +---------+---------------+---------+-----------+----------+--------------+   +---------+---------------+---------+-----------+----------+---------------+ LEFT     CompressibilityPhasicitySpontaneityPropertiesThrombus Aging  +---------+---------------+---------+-----------+----------+---------------+ CFV      Full           Yes      Yes                                  +---------+---------------+---------+-----------+----------+---------------+ SFJ      Full                                                         +---------+---------------+---------+-----------+----------+---------------+  FV Prox  Full                                                         +---------+---------------+---------+-----------+----------+---------------+ FV Mid                  Yes      Yes                  Patent by color +---------+---------------+---------+-----------+----------+---------------+ FV Distal               Yes      Yes                  Patent by color +---------+---------------+---------+-----------+----------+---------------+ PFV      Full                                                         +---------+---------------+---------+-----------+----------+---------------+ POP                     Yes      Yes                  Patent by color +---------+---------------+---------+-----------+----------+---------------+ PTV                                                    Patent by color +---------+---------------+---------+-----------+----------+---------------+ PERO                                                  Patent by color +---------+---------------+---------+-----------+----------+---------------+ Left leg was too painful to touch and compress with US  probe. No evidence of DVT with color imaging.  Summary: RIGHT: - There is no evidence of deep vein thrombosis in the lower extremity.  - No cystic structure found in the popliteal fossa. - Ultrasound characteristics of enlarged lymph nodes are noted in the groin.  LEFT: - There is no evidence of deep vein thrombosis in the lower extremity.  - No cystic structure found in the popliteal fossa. - Ultrasound characteristics of enlarged lymph nodes noted in the groin.  *See table(s) above for measurements and observations. Electronically signed by Lonni Gaskins MD on 01/25/2024 at 12:08:49 PM.    Final    DG Chest Port 1 View Result Date: 01/24/2024 EXAM: 1 VIEW(S) XRAY OF THE CHEST 01/24/2024 01:08:00 PM COMPARISON: 09/27/2023 CLINICAL HISTORY: weakness. Weakness in legs FINDINGS: LUNGS AND PLEURA: No focal pulmonary opacity. No pulmonary edema. There is a small to moderate right pleural effusion and small left effusion. No pneumothorax. Bibasilar atelectasis. HEART AND MEDIASTINUM: Cardiomegaly, unchanged. Aortic calcification. BONES AND SOFT TISSUES: No acute osseous abnormality. IMPRESSION: 1. Small to moderate right pleural effusion and small left pleural effusion. 2. Bibasilar atelectasis. 3. Cardiomegaly, unchanged. 4. Aortic atherosclerosis. Electronically signed by: Waddell Calk MD 01/24/2024 01:29 PM EDT RP Workstation: [id number]  DG Foot Complete Left Result Date: 01/24/2024 CLINICAL DATA:  pain Patient reports bilateral foot blisters after taking doxycycline . Leg swelling and pain. EXAM: LEFT FOOT - COMPLETE 3+ VIEW; RIGHT FOOT COMPLETE - 3+ VIEW; LEFT TIBIA AND FIBULA - 2  VIEW; RIGHT TIBIA AND FIBULA - 2 VIEW COMPARISON:  None Available. FINDINGS: Right lower leg: Mild osseous demineralization. No evidence of acute fracture, dislocation or bone destruction. There are mild degenerative changes at the right knee and ankle. Mild generalized subcutaneous edema without evidence of foreign body or soft tissue emphysema. Scattered vascular calcifications. Left lower leg: Mild osseous demineralization. No evidence of acute fracture, dislocation or bone destruction. There are mild degenerative changes at the knee and ankle. Generalized subcutaneous edema without evidence of foreign body or soft tissue emphysema. Scattered vascular calcifications. Bilateral feet: The bones are demineralized. There are transverse sclerotic bands traversing the 2nd through 5th metatarsal heads bilaterally, suspicious for chronic stress fractures. No displaced fracture, dislocation or bone destruction identified. There are mild degenerative changes at the 1st metatarsophalangeal joint bilaterally. There are midfoot degenerative changes with prominent dorsal navicular spurring on the right. Small bidirectional calcaneal spurs, larger on the left. Dorsal forefoot soft tissue swelling bilaterally. No evidence of foreign body or soft tissue emphysema. IMPRESSION: 1. No evidence of acute fracture, dislocation or bone destruction in the lower legs or feet. 2. Transverse sclerotic bands traversing the 2nd through 5th metatarsal heads bilaterally, suspicious for chronic stress fractures. Correlate clinically. 3. Nonspecific soft tissue swelling in both lower legs and feet. No evidence of foreign body or soft tissue emphysema. Electronically Signed   By: Elsie Perone M.D.   On: 01/24/2024 11:41   DG Foot Complete Right Result Date: 01/24/2024 CLINICAL DATA:  pain Patient reports bilateral foot blisters after taking doxycycline . Leg swelling and pain. EXAM: LEFT FOOT - COMPLETE 3+ VIEW; RIGHT FOOT COMPLETE - 3+ VIEW;  LEFT TIBIA AND FIBULA - 2 VIEW; RIGHT TIBIA AND FIBULA - 2 VIEW COMPARISON:  None Available. FINDINGS: Right lower leg: Mild osseous demineralization. No evidence of acute fracture, dislocation or bone destruction. There are mild degenerative changes at the right knee and ankle. Mild generalized subcutaneous edema without evidence of foreign body or soft tissue emphysema. Scattered vascular calcifications. Left lower leg: Mild osseous demineralization. No evidence of acute fracture, dislocation or bone destruction. There are mild degenerative changes at the knee and ankle. Generalized subcutaneous edema without evidence of foreign body or soft tissue emphysema. Scattered vascular calcifications. Bilateral feet: The bones are demineralized. There are transverse sclerotic bands traversing the 2nd through 5th metatarsal heads bilaterally, suspicious for chronic stress fractures. No displaced fracture, dislocation or bone destruction identified. There are mild degenerative changes at the 1st metatarsophalangeal joint bilaterally. There are midfoot degenerative changes with prominent dorsal navicular spurring on the right. Small bidirectional calcaneal spurs, larger on the left. Dorsal forefoot soft tissue swelling bilaterally. No evidence of foreign body or soft tissue emphysema. IMPRESSION: 1. No evidence of acute fracture, dislocation or bone destruction in the lower legs or feet. 2. Transverse sclerotic bands traversing the 2nd through 5th metatarsal heads bilaterally, suspicious for chronic stress fractures. Correlate clinically. 3. Nonspecific soft tissue swelling in both lower legs and feet. No evidence of foreign body or soft tissue emphysema. Electronically Signed   By: Elsie Perone M.D.   On: 01/24/2024 11:41   DG Tibia/Fibula Left Result Date: 01/24/2024 CLINICAL DATA:  pain Patient reports bilateral foot blisters after taking doxycycline . Leg swelling and pain.  EXAM: LEFT FOOT - COMPLETE 3+ VIEW; RIGHT  FOOT COMPLETE - 3+ VIEW; LEFT TIBIA AND FIBULA - 2 VIEW; RIGHT TIBIA AND FIBULA - 2 VIEW COMPARISON:  None Available. FINDINGS: Right lower leg: Mild osseous demineralization. No evidence of acute fracture, dislocation or bone destruction. There are mild degenerative changes at the right knee and ankle. Mild generalized subcutaneous edema without evidence of foreign body or soft tissue emphysema. Scattered vascular calcifications. Left lower leg: Mild osseous demineralization. No evidence of acute fracture, dislocation or bone destruction. There are mild degenerative changes at the knee and ankle. Generalized subcutaneous edema without evidence of foreign body or soft tissue emphysema. Scattered vascular calcifications. Bilateral feet: The bones are demineralized. There are transverse sclerotic bands traversing the 2nd through 5th metatarsal heads bilaterally, suspicious for chronic stress fractures. No displaced fracture, dislocation or bone destruction identified. There are mild degenerative changes at the 1st metatarsophalangeal joint bilaterally. There are midfoot degenerative changes with prominent dorsal navicular spurring on the right. Small bidirectional calcaneal spurs, larger on the left. Dorsal forefoot soft tissue swelling bilaterally. No evidence of foreign body or soft tissue emphysema. IMPRESSION: 1. No evidence of acute fracture, dislocation or bone destruction in the lower legs or feet. 2. Transverse sclerotic bands traversing the 2nd through 5th metatarsal heads bilaterally, suspicious for chronic stress fractures. Correlate clinically. 3. Nonspecific soft tissue swelling in both lower legs and feet. No evidence of foreign body or soft tissue emphysema. Electronically Signed   By: Elsie Perone M.D.   On: 01/24/2024 11:41   DG Tibia/Fibula Right Result Date: 01/24/2024 CLINICAL DATA:  pain Patient reports bilateral foot blisters after taking doxycycline . Leg swelling and pain. EXAM: LEFT FOOT  - COMPLETE 3+ VIEW; RIGHT FOOT COMPLETE - 3+ VIEW; LEFT TIBIA AND FIBULA - 2 VIEW; RIGHT TIBIA AND FIBULA - 2 VIEW COMPARISON:  None Available. FINDINGS: Right lower leg: Mild osseous demineralization. No evidence of acute fracture, dislocation or bone destruction. There are mild degenerative changes at the right knee and ankle. Mild generalized subcutaneous edema without evidence of foreign body or soft tissue emphysema. Scattered vascular calcifications. Left lower leg: Mild osseous demineralization. No evidence of acute fracture, dislocation or bone destruction. There are mild degenerative changes at the knee and ankle. Generalized subcutaneous edema without evidence of foreign body or soft tissue emphysema. Scattered vascular calcifications. Bilateral feet: The bones are demineralized. There are transverse sclerotic bands traversing the 2nd through 5th metatarsal heads bilaterally, suspicious for chronic stress fractures. No displaced fracture, dislocation or bone destruction identified. There are mild degenerative changes at the 1st metatarsophalangeal joint bilaterally. There are midfoot degenerative changes with prominent dorsal navicular spurring on the right. Small bidirectional calcaneal spurs, larger on the left. Dorsal forefoot soft tissue swelling bilaterally. No evidence of foreign body or soft tissue emphysema. IMPRESSION: 1. No evidence of acute fracture, dislocation or bone destruction in the lower legs or feet. 2. Transverse sclerotic bands traversing the 2nd through 5th metatarsal heads bilaterally, suspicious for chronic stress fractures. Correlate clinically. 3. Nonspecific soft tissue swelling in both lower legs and feet. No evidence of foreign body or soft tissue emphysema. Electronically Signed   By: Elsie Perone M.D.   On: 01/24/2024 11:41     Labs:   Basic Metabolic Panel: Recent Labs  Lab 01/27/24 0556 01/28/24 0502 01/29/24 0502 01/30/24 0451 01/31/24 1217 02/01/24 0528   NA 138 139 139 139 137 136  K 3.6 3.8 4.1 4.0 4.7 4.0  CL 101 101 101  99 97* 98  CO2 22 26 26 26 28 27   GLUCOSE 116* 126* 150* 149* 196* 178*  BUN 49* 47* 45* 47* 53* 53*  CREATININE 1.99* 2.19* 2.26* 2.50* 2.65* 2.45*  CALCIUM  9.5 9.2 9.1 9.2 9.2 9.2  MG 2.0 1.8  --   --   --   --    GFR Estimated Creatinine Clearance: 22 mL/min (A) (by C-G formula based on SCr of 2.45 mg/dL (H)). Liver Function Tests: No results for input(s): AST, ALT, ALKPHOS, BILITOT, PROT, ALBUMIN in the last 168 hours. No results for input(s): LIPASE, AMYLASE in the last 168 hours. No results for input(s): AMMONIA in the last 168 hours. Coagulation profile No results for input(s): INR, PROTIME in the last 168 hours.  CBC: Recent Labs  Lab 01/27/24 0556 01/28/24 0502 01/29/24 0502 01/30/24 0451  WBC 12.0* 9.3 9.6 8.4  NEUTROABS 10.0* 7.3  --   --   HGB 11.3* 10.4* 9.8* 9.7*  HCT 36.6 33.7* 32.5* 33.5*  MCV 84.3 85.3 86.9 88.2  PLT 326 296 287 270   Cardiac Enzymes: No results for input(s): CKTOTAL, CKMB, CKMBINDEX, TROPONINI in the last 168 hours. BNP: Invalid input(s): POCBNP CBG: Recent Labs  Lab 02/01/24 0743 02/01/24 1236 02/01/24 1659 02/01/24 2120 02/02/24 0747  GLUCAP 182* 166* 165* 148* 207*   D-Dimer No results for input(s): DDIMER in the last 72 hours. Hgb A1c No results for input(s): HGBA1C in the last 72 hours. Lipid Profile No results for input(s): CHOL, HDL, LDLCALC, TRIG, CHOLHDL, LDLDIRECT in the last 72 hours. Thyroid function studies No results for input(s): TSH, T4TOTAL, T3FREE, THYROIDAB in the last 72 hours.  Invalid input(s): FREET3 Anemia work up No results for input(s): VITAMINB12, FOLATE, FERRITIN, TIBC, IRON , RETICCTPCT in the last 72 hours. Microbiology Recent Results (from the past 240 hours)  Culture, blood (routine x 2)     Status: None   Collection Time: 01/24/24 11:14 AM    Specimen: BLOOD LEFT FOREARM  Result Value Ref Range Status   Specimen Description   Final    BLOOD LEFT FOREARM Performed at [facility], [address] [place], [place] [address]    Special Requests   Final    BOTTLES DRAWN AEROBIC AND ANAEROBIC Blood Culture adequate volume Performed at [facility], [address] [place], [place] [address]    Culture   Final    NO GROWTH 5 DAYS Performed at [facility], [address], [place], [place] [address]    Report Status 01/30/2024 FINAL  Final  Culture, blood (routine x 2)     Status: None   Collection Time: 01/24/24 11:35 AM   Specimen: BLOOD  Result Value Ref Range Status   Specimen Description   Final    BLOOD RIGHT ANTECUBITAL Performed at [facility], [address], [place], [place] [address]    Special Requests   Final    BOTTLES DRAWN AEROBIC AND ANAEROBIC Blood Culture results may not be optimal due to an inadequate volume of blood received in culture bottles Performed at [facility], [address], [place], [place] [address]    Culture   Final    NO GROWTH 5 DAYS Performed at [facility], [address], [place], [place] [address]    Report Status 01/29/2024 FINAL  Final     Discharge Instructions:   Discharge Instructions     (HEART FAILURE PATIENTS) Call MD:  Anytime you have any of the following symptoms: 1) 3 pound weight gain in 24 hours or 5 pounds in 1 week 2) shortness of breath, with or without a dry hacking cough 3) swelling in the hands, feet or stomach 4) if you have to sleep on extra pillows at night in order to breathe.   Complete by: As directed    Diet - low sodium heart healthy   Complete by: As directed    Diet Carb Modified   Complete by: As directed    Heart Failure patients record your daily weight using the same scale at the same time of day   Complete by: As directed    Increase activity slowly   Complete by:  As directed       Allergies as of 02/02/2024       Reactions   Nsaids Swelling   Hands and legs swell   Penicillins Hives   Has patient had a PCN reaction causing immediate rash, facial/tongue/throat swelling, SOB or lightheadedness with hypotension: Yes Has patient had a PCN reaction causing severe rash involving mucus membranes or skin necrosis: No Has patient had a PCN reaction that required hospitalization: No Has patient had a PCN reaction occurring within the last 10 years: No If all of the above answers are NO, then may proceed with Cephalosporin use.   Quinapril  Hcl Other (See Comments)   Other reaction(s): elevated potassium and creatinine        Medication List     STOP taking these medications    azithromycin  250 MG tablet Commonly known as: ZITHROMAX    bisoprolol  5 MG tablet Commonly known as: ZEBETA    doxycycline  100 MG tablet Commonly known as: VIBRA -TABS   ferrous sulfate  325 (65 FE) MG tablet   Insulin  Syringe-Needle U-100 31G X 5/16 1 ML Misc   meclizine 12.5 MG tablet Commonly known as: ANTIVERT   predniSONE 10 MG tablet Commonly known as: DELTASONE       TAKE these medications    acetaminophen  500 MG tablet Commonly known as: TYLENOL  Take 500 mg by mouth every 6 (six) hours as needed for mild pain (pain score 1-3) or moderate pain (pain score 4-6).   albuterol  108 (90 Base) MCG/ACT inhaler Commonly known as: VENTOLIN  HFA Inhale 2 puffs into the lungs every 4 (four) hours as needed for wheezing or shortness of breath.   allopurinol  100 MG tablet Commonly known as: ZYLOPRIM  Take 100 mg by mouth in the morning.   amLODipine  5 MG tablet Commonly known as: NORVASC  Take 5 mg by mouth every morning.   apixaban  5 MG Tabs tablet Commonly known as: Eliquis  Take 1 tablet (5 mg total) by mouth 2 (two) times daily.   atorvastatin  80 MG tablet Commonly known as: LIPITOR Take 1 tablet (80 mg total) by mouth at bedtime.   diclofenac   Sodium 1 % Gel Commonly known as: VOLTAREN  Apply 2 g topically 4 (four) times daily.   gabapentin 100 MG capsule Commonly known as: NEURONTIN Take 1 capsule (100 mg total) by mouth 2 (two) times daily.   Gerhardt's butt cream Crea Apply 1 Application topically 2 (two) times daily.   HYDROcodone -acetaminophen  5-325 MG tablet Commonly known as: NORCO/VICODIN Take 1-2 tablets by mouth every 4 (four) hours as needed for moderate pain (pain score 4-6) or severe pain (pain score 7-10). What changed:  how much to take when to take this reasons to take this   insulin  NPH-regular Human (70-30) 100 UNIT/ML injection Inject  30 Units into the skin 2 (two) times daily with a meal. Inject 30 units into the skin in the morning and 60 units into the skin in the evening What changed:  how much to take additional instructions   ipratropium-albuterol  0.5-2.5 (3) MG/3ML Soln Commonly known as: DUONEB Take 3 mLs by nebulization every 6 (six) hours as needed (SOB).   linezolid 600 MG tablet Commonly known as: ZYVOX Take 1 tablet (600 mg total) by mouth every 12 (twelve) hours.   loratadine  10 MG tablet Commonly known as: CLARITIN  Take 10 mg by mouth in the morning.   metolazone 2.5 MG tablet Commonly known as: ZAROXOLYN Take 1 tablet (2.5 mg total) by mouth daily as needed (edema). What changed: reasons to take this   pantoprazole  40 MG tablet Commonly known as: PROTONIX  Take 1 tablet (40 mg total) by mouth 2 (two) times daily.   sucralfate 1 GM/10ML suspension Commonly known as: CARAFATE Take 10 mLs (1 g total) by mouth 4 (four) times daily -  with meals and at bedtime.   tiZANidine 4 MG tablet Commonly known as: ZANAFLEX Take 1 tablet (4 mg total) by mouth as needed for muscle spasms.   Torsemide  60 MG Tabs Take 60 mg by mouth daily. What changed:  medication strength how much to take when to take this additional instructions   Trelegy Ellipta  100-62.5-25 MCG/ACT  Aepb Generic drug: Fluticasone -Umeclidin-Vilant INHALE 1 PUFF INTO THE LUNGS DAILY        Contact information for after-discharge care     Destination     [facility] .   Service: Skilled Nursing Contact information: [address] [place]   [address] [phone]                      Time coordinating discharge: 45 min  Signed:  Harlene RAYMOND Bowl DO  [facility] 02/02/2024, 10:58 AM

## 2024-02-03 DIAGNOSIS — A419 Sepsis, unspecified organism: Secondary | ICD-10-CM | POA: Diagnosis not present

## 2024-02-03 DIAGNOSIS — L0390 Cellulitis, unspecified: Secondary | ICD-10-CM | POA: Diagnosis not present

## 2024-02-03 LAB — GLUCOSE, CAPILLARY
Glucose-Capillary: 179 mg/dL — ABNORMAL HIGH (ref 70–99)
Glucose-Capillary: 168 mg/dL — ABNORMAL HIGH (ref 70–99)
Glucose-Capillary: 156 mg/dL — ABNORMAL HIGH (ref 70–99)
Glucose-Capillary: 135 mg/dL — ABNORMAL HIGH (ref 70–99)

## 2024-02-03 NOTE — Progress Notes (Signed)
 OT Cancellation Note  Patient Details Name: Paula Wheeler MRN: [id number] DOB: 06-Feb-1951   Cancelled Treatment:    Reason Eval/Treat Not Completed: (P) Pain limiting ability to participate, Pt just worked with PT and got to chair, does not with to move for a while until pain is managed. Will check back as time allows  [name] 02/03/2024, 2:52 PM

## 2024-02-03 NOTE — Progress Notes (Signed)
 Physical Therapy Treatment Patient Details Name: Paula Wheeler MRN: [id number] DOB: Apr 10, 1951 Today's Date: 02/03/2024   History of Present Illness 73 yo female admitted bil LE pain/blisters. Dx: LE cellulitis, sepsis, acute on chronic HF, anemia s/p transfusion. Hx of CKD, Afib, DM, COPD, anemia, obesity, falls.    PT Comments  AxO x 3 pleasant and willing.  Prior home alone and IND (no driving) with Family coming 1-2 times per week.  Lots of ANXIETY.  Low pain threshold. Pt was premedicated with 2 tablets of HYDROcodone .  Assisted OOB to amb was difficult.  Required increased time and use of bed pad to complete scooting to EOB.  General transfer comment: Cues for safety, technique, hand placement. Assist to rise, steady, control descent. Increased time and effort.  Required + 2 side by side assist. General Gait Details: very limited amb distance due to increased L LE pain with HIGH anxiety and + 2 assist for safety.  Recliner following for safety.  Pt reports L LE pain is 10/10. Reported to RN.  Pt requesting something stronger for pain. Pt will need ST Rehab at SNF to address mobility and functional decline prior to safely returning home.    If plan is discharge home, recommend the following: A lot of help with walking and/or transfers;A lot of help with bathing/dressing/bathroom;Assistance with cooking/housework;Assist for transportation;Help with stairs or ramp for entrance   Can travel by private vehicle     No  Equipment Recommendations  None recommended by PT    Recommendations for Other Services       Precautions / Restrictions Precautions Precautions: Fall Precaution/Restrictions Comments: blisters, wounds LEs Restrictions Weight Bearing Restrictions Per Provider Order: No     Mobility  Bed Mobility Overal bed mobility: Needs Assistance Bed Mobility: Supine to Sit     Supine to sit: Mod assist, Max assist     General bed mobility comments: Increased time and  effort. Cues provided. Used bed pad to complete scooting.    Transfers Overall transfer level: Needs assistance Equipment used: Rolling walker (2 wheels) Transfers: Sit to/from Stand Sit to Stand: Mod assist, +2 physical assistance, +2 safety/equipment, From elevated surface           General transfer comment: Cues for safety, technique, hand placement. Assist to rise, steady, control descent. Increased time and effort.  Required + 2 side by side assist.    Ambulation/Gait Ambulation/Gait assistance: Mod assist, +2 physical assistance, +2 safety/equipment Gait Distance (Feet): 2 Feet Assistive device: Rolling walker (2 wheels) Gait Pattern/deviations: Step-to pattern Gait velocity: decreased     General Gait Details: very limited amb distance due to increased L LE pain with HIGH anxiety and + 2 assist for safety.  Recliner following for safety.  Pt reports L LE pain is 10/10.   Stairs             Wheelchair Mobility     Tilt Bed    Modified Rankin (Stroke Patients Only)       Balance                                            Communication Communication Communication: No apparent difficulties Factors Affecting Communication: Hearing impaired  Cognition Arousal: Alert Behavior During Therapy: WFL for tasks assessed/performed  PT - Cognition Comments: AxO x 3 pleasant and willing.  Prior home alone and IND (no driving) with Family coming 1-2 times per week.  Lots of ANXIETY.  Low pain threshold. Following commands: Intact      Cueing Cueing Techniques: Verbal cues  Exercises      General Comments        Pertinent Vitals/Pain Pain Assessment Pain Assessment: 0-10 Pain Score: 10-Worst pain ever Faces Pain Scale: Hurts whole lot Pain Location: bil LEs, L>R Pain Descriptors / Indicators: Grimacing, Guarding, Tender, Throbbing, Aching, Crying Pain Intervention(s): Monitored during session,  Premedicated before session, Repositioned    Home Living                          Prior Function            PT Goals (current goals can now be found in the care plan section) Progress towards PT goals: Progressing toward goals    Frequency    Min 2X/week      PT Plan      Co-evaluation              AM-PAC PT 6 Clicks Mobility   Outcome Measure  Help needed turning from your back to your side while in a flat bed without using bedrails?: A Lot Help needed moving from lying on your back to sitting on the side of a flat bed without using bedrails?: A Lot Help needed moving to and from a bed to a chair (including a wheelchair)?: A Lot Help needed standing up from a chair using your arms (e.g., wheelchair or bedside chair)?: A Lot Help needed to walk in hospital room?: A Lot Help needed climbing 3-5 steps with a railing? : Total 6 Click Score: 11    End of Session Equipment Utilized During Treatment: Gait belt Activity Tolerance: Patient limited by pain Patient left: in chair;with call bell/phone within reach Nurse Communication: Mobility status PT Visit Diagnosis: Muscle weakness (generalized) (M62.81);Pain;Other abnormalities of gait and mobility (R26.89);Difficulty in walking, not elsewhere classified (R26.2) Pain - Right/Left: Left Pain - part of body: Leg     Time: 8897-8871 PT Time Calculation (min) (ACUTE ONLY): 26 min  Charges:    $Gait Training: 8-22 mins $Therapeutic Activity: 8-22 mins PT General Charges $$ ACUTE PT VISIT: 1 Visit                     Katheryn Leap  PTA Acute  Rehabilitation Services Office M-F          [phone]

## 2024-02-03 NOTE — Discharge Summary (Signed)
 Physician Discharge Summary  Paula Wheeler FMW:[id number] DOB: [birth year] DOA: 01/24/2024  PCP: Paula Carlin Redbird, MD  Admit date: 01/24/2024 Discharge date: 02/03/2024  Admitted From:  Discharge disposition: SNF when bed available (appears insurance has requested another PT eval-- have been waiting for auth for 4 days)   Recommendations for Outpatient Follow-Up:   Antibiotics through 10/8 Elevate extremities Close outpatient follow-up with nephrology for changes in diuretic management to avoid lower extremity edema Monitor blood sugars and adjust insulin  as needed -Outpatient follow-up with GI, Paula Wheeler, in 6 to 8 weeks.   Discharge Diagnosis:      Discharge Condition: Improved.  Diet recommendation: Low sodium, heart healthy/carb mod  Wound care: None.  Code status: Full.   History of Present Illness:   Paula Wheeler is a 73 y.o. female with medical history significant for CKD stage IV, prior upper GI bleed, longstanding persistent atrial fibrillation on Eliquis , type 2 diabetes being admitted to the hospital with sepsis due to bilateral lower extremity cellulitis found to have acute on chronic anemia.  Patient states that for the last couple of weeks she has been feeling fatigued, she also has chronic lower extremity mild edema and erythema which has worsened.  She recently took a course of p.o. doxycycline  to treat presumed cellulitis, however she developed worsening discomfort and blisters draining clear fluid.  She denies any fevers or chills, denies any abdominal pain, nausea or vomiting.  Denies any hematochezia, or melena states that her bowel movements have been normal.  She has continued to take her home Eliquis , the last dose was this morning about 3 AM.  Came to the emergency department for evaluation of her lower extremities, she has been started on empiric IV antibiotics to cover for cellulitis was also incidentally found to have a hemoglobin of  5.2.  2 unit blood transfusion has been ordered, and hospitalist admission was requested.    Hospital Course by Problem:   severe sepsis secondary to bilateral lower extremity cellulitis - Patient met criteria for sepsis on presentation due to tachycardia, leukocytosis, initial lactic level of 3.4. - Patient noted to have been on doxycycline  outpatient send in the past however no significant improvement. - Patient given a bolus of IV fluids on admission. -Plain films of the lower extremities negative for any acute fracture however consistent with a cellulitis. -Lower extremity Dopplers negative for DVT. -Blood cultures NGTS -  IV vancomycin  and will transition to Zyvox to complete a 7-day course  - Supportive care.    Acute on chronic anemia/iron  deficiency anemia/symptomatic anemia/folate deficiency/erosive esophagitis and gastritis.  EGD of 01/26/2024 -Status posttransfusion 4 units PRBCs. -Patient seen in consultation by GI and patient underwent upper endoscopy 01/26/2024 which showed LA grade C reflux esophagitis with no bleeding, gastritis which was biopsied, normal examined duodenum. -Continue Carafate slurry 4 times daily, oral PPI - resume Eliquis  on 01/28/2024 per GI recommendations. -Outpatient follow-up with GI, Paula Wheeler, in 6 to 8 weeks.   Persistent A-fib -Currently rate controlled. - Eliquis  resumed   Acute on chronic diastolic CHF -Patient with some bibasilar crackles noted on examination. - Patient with lower extremity edema likely in part secondary to an acute cellulitis. -2D echo (07/18/2023) with a EF of 60 to 65%,NWMA, moderately dilated left atrial size. - proBNP elevated at 3354.  - Changed back to home Demadex  from IV Lasix  - Strict I's and O's, daily weights.    Diabetes mellitus type 2 -Hemoglobin A1c 7.0. -  Resume lower dose of insulin  at discharge    Left lower extremity pain -Patient with complaints of left lower extremity pain with difficulty  ambulating. - X-ray unrevealing - CT scan shows some subcutaneous edema, suspect related to heart failure so we will continue IV Lasix -doubt any infection as white blood cell count has normalized and patient is afebrile - Has already had duplex of lower extremities without any clots seen -responded well to low dose gabapentin    CKD stage IV -Stable. - Currently at baseline. -Follows with Dr. Gearline and as an outpatient, will need close follow-up    Hypertension -.  Continue home regimen Norvasc .   - Continue IV Lasix  secondary to CHF.-Changed to oral and will need close trending and adjustments of medications   Hyperlipidemia - Statin.      GERD - PPI as above    Pressure injury, POA -Wound care: Wound 01/24/24 1600 Pressure Injury Heel Right Deep Tissue Pressure Injury - Purple or maroon localized area of discolored intact skin or blood-filled blister due to damage of underlying soft tissue from pressure and/or shear. (Active)     Wound 01/24/24 1700 Pressure Injury Ischial tuberosity Right Stage 2 -  Partial thickness loss of dermis presenting as a shallow open injury with a red, pink wound bed without slough. (Active)           Medical Consultants:   GI   Discharge Exam:   Vitals:   02/03/24 0517 02/03/24 1005  BP: (!) 137/58 (!) 129/47  Pulse: 90 81  Resp:    Temp: 97.9 F (36.6 C)   SpO2: 100%    Vitals:   02/02/24 2126 02/03/24 0423 02/03/24 0517 02/03/24 1005  BP: (!) 108/52  (!) 137/58 (!) 129/47  Pulse: 77  90 81  Resp:      Temp: 98.1 F (36.7 C)  97.9 F (36.6 C)   TempSrc:      SpO2: 92%  100%   Weight:  82.1 kg    Height:        General exam: Appears calm and comfortable.    The results of significant diagnostics from this hospitalization (including imaging, microbiology, ancillary and laboratory) are listed below for reference.     Procedures and Diagnostic Studies:   VAS US  LOWER EXTREMITY VENOUS (DVT) (ONLY [facility] & [facility]) Result Date:  01/25/2024  Lower Venous DVT Study Patient Name:  Paula Wheeler  Date of Exam:   01/25/2024 Medical Rec #: [id number]          Accession #:    [id number] Date of Birth: 02/13/1951          Patient Gender: F Patient Age:   73 years Exam Location:  [facility] Procedure:      VAS US  LOWER EXTREMITY VENOUS (DVT) Referring Phys: MAUDE MESSICK --------------------------------------------------------------------------------  Indications: Pain, Swelling, Edema, and redness and blisters on both lower extremities. Other Indications: Bilateral lower cellulitis. Comparison Study: 08/03/21 Negative LEV Performing Technologist: Ricka Sturdivant-Jones RDMS, RVT  Examination Guidelines: A complete evaluation includes B-mode imaging, spectral Doppler, color Doppler, and power Doppler as needed of all accessible portions of each vessel. Bilateral testing is considered an integral part of a complete examination. Limited examinations for reoccurring indications may be performed as noted. The reflux portion of the exam is performed with the patient in reverse Trendelenburg.  +---------+---------------+---------+-----------+----------+--------------+ RIGHT    CompressibilityPhasicitySpontaneityPropertiesThrombus Aging +---------+---------------+---------+-----------+----------+--------------+ CFV      Full  Yes      Yes                                 +---------+---------------+---------+-----------+----------+--------------+ SFJ      Full                                                        +---------+---------------+---------+-----------+----------+--------------+ FV Prox  Full                                                        +---------+---------------+---------+-----------+----------+--------------+ FV Mid   Full           Yes      Yes                                 +---------+---------------+---------+-----------+----------+--------------+ FV DistalFull                                                         +---------+---------------+---------+-----------+----------+--------------+ PFV      Full                                                        +---------+---------------+---------+-----------+----------+--------------+ POP      Full           Yes      Yes                                 +---------+---------------+---------+-----------+----------+--------------+ PTV      Full                                                        +---------+---------------+---------+-----------+----------+--------------+ PERO     Full                                                        +---------+---------------+---------+-----------+----------+--------------+   +---------+---------------+---------+-----------+----------+---------------+ LEFT     CompressibilityPhasicitySpontaneityPropertiesThrombus Aging  +---------+---------------+---------+-----------+----------+---------------+ CFV      Full           Yes      Yes                                  +---------+---------------+---------+-----------+----------+---------------+ SFJ      Full                                                         +---------+---------------+---------+-----------+----------+---------------+  FV Prox  Full                                                         +---------+---------------+---------+-----------+----------+---------------+ FV Mid                  Yes      Yes                  Patent by color +---------+---------------+---------+-----------+----------+---------------+ FV Distal               Yes      Yes                  Patent by color +---------+---------------+---------+-----------+----------+---------------+ PFV      Full                                                         +---------+---------------+---------+-----------+----------+---------------+ POP                     Yes      Yes                  Patent  by color +---------+---------------+---------+-----------+----------+---------------+ PTV                                                   Patent by color +---------+---------------+---------+-----------+----------+---------------+ PERO                                                  Patent by color +---------+---------------+---------+-----------+----------+---------------+ Left leg was too painful to touch and compress with US  probe. No evidence of DVT with color imaging.  Summary: RIGHT: - There is no evidence of deep vein thrombosis in the lower extremity.  - No cystic structure found in the popliteal fossa. - Ultrasound characteristics of enlarged lymph nodes are noted in the groin.  LEFT: - There is no evidence of deep vein thrombosis in the lower extremity.  - No cystic structure found in the popliteal fossa. - Ultrasound characteristics of enlarged lymph nodes noted in the groin.  *See table(s) above for measurements and observations. Electronically signed by Lonni Gaskins MD on 01/25/2024 at 12:08:49 PM.    Final    DG Chest Port 1 View Result Date: 01/24/2024 EXAM: 1 VIEW(S) XRAY OF THE CHEST 01/24/2024 01:08:00 PM COMPARISON: 09/27/2023 CLINICAL HISTORY: weakness. Weakness in legs FINDINGS: LUNGS AND PLEURA: No focal pulmonary opacity. No pulmonary edema. There is a small to moderate right pleural effusion and small left effusion. No pneumothorax. Bibasilar atelectasis. HEART AND MEDIASTINUM: Cardiomegaly, unchanged. Aortic calcification. BONES AND SOFT TISSUES: No acute osseous abnormality. IMPRESSION: 1. Small to moderate right pleural effusion and small left pleural effusion. 2. Bibasilar atelectasis. 3. Cardiomegaly, unchanged. 4. Aortic atherosclerosis. Electronically signed by: Waddell Calk MD 01/24/2024 01:29 PM EDT RP Workstation: [id number]  DG Foot Complete Left Result Date: 01/24/2024 CLINICAL DATA:  pain Patient reports bilateral foot blisters after taking  doxycycline . Leg swelling and pain. EXAM: LEFT FOOT - COMPLETE 3+ VIEW; RIGHT FOOT COMPLETE - 3+ VIEW; LEFT TIBIA AND FIBULA - 2 VIEW; RIGHT TIBIA AND FIBULA - 2 VIEW COMPARISON:  None Available. FINDINGS: Right lower leg: Mild osseous demineralization. No evidence of acute fracture, dislocation or bone destruction. There are mild degenerative changes at the right knee and ankle. Mild generalized subcutaneous edema without evidence of foreign body or soft tissue emphysema. Scattered vascular calcifications. Left lower leg: Mild osseous demineralization. No evidence of acute fracture, dislocation or bone destruction. There are mild degenerative changes at the knee and ankle. Generalized subcutaneous edema without evidence of foreign body or soft tissue emphysema. Scattered vascular calcifications. Bilateral feet: The bones are demineralized. There are transverse sclerotic bands traversing the 2nd through 5th metatarsal heads bilaterally, suspicious for chronic stress fractures. No displaced fracture, dislocation or bone destruction identified. There are mild degenerative changes at the 1st metatarsophalangeal joint bilaterally. There are midfoot degenerative changes with prominent dorsal navicular spurring on the right. Small bidirectional calcaneal spurs, larger on the left. Dorsal forefoot soft tissue swelling bilaterally. No evidence of foreign body or soft tissue emphysema. IMPRESSION: 1. No evidence of acute fracture, dislocation or bone destruction in the lower legs or feet. 2. Transverse sclerotic bands traversing the 2nd through 5th metatarsal heads bilaterally, suspicious for chronic stress fractures. Correlate clinically. 3. Nonspecific soft tissue swelling in both lower legs and feet. No evidence of foreign body or soft tissue emphysema. Electronically Signed   By: Elsie Perone M.D.   On: 01/24/2024 11:41   DG Foot Complete Right Result Date: 01/24/2024 CLINICAL DATA:  pain Patient reports bilateral  foot blisters after taking doxycycline . Leg swelling and pain. EXAM: LEFT FOOT - COMPLETE 3+ VIEW; RIGHT FOOT COMPLETE - 3+ VIEW; LEFT TIBIA AND FIBULA - 2 VIEW; RIGHT TIBIA AND FIBULA - 2 VIEW COMPARISON:  None Available. FINDINGS: Right lower leg: Mild osseous demineralization. No evidence of acute fracture, dislocation or bone destruction. There are mild degenerative changes at the right knee and ankle. Mild generalized subcutaneous edema without evidence of foreign body or soft tissue emphysema. Scattered vascular calcifications. Left lower leg: Mild osseous demineralization. No evidence of acute fracture, dislocation or bone destruction. There are mild degenerative changes at the knee and ankle. Generalized subcutaneous edema without evidence of foreign body or soft tissue emphysema. Scattered vascular calcifications. Bilateral feet: The bones are demineralized. There are transverse sclerotic bands traversing the 2nd through 5th metatarsal heads bilaterally, suspicious for chronic stress fractures. No displaced fracture, dislocation or bone destruction identified. There are mild degenerative changes at the 1st metatarsophalangeal joint bilaterally. There are midfoot degenerative changes with prominent dorsal navicular spurring on the right. Small bidirectional calcaneal spurs, larger on the left. Dorsal forefoot soft tissue swelling bilaterally. No evidence of foreign body or soft tissue emphysema. IMPRESSION: 1. No evidence of acute fracture, dislocation or bone destruction in the lower legs or feet. 2. Transverse sclerotic bands traversing the 2nd through 5th metatarsal heads bilaterally, suspicious for chronic stress fractures. Correlate clinically. 3. Nonspecific soft tissue swelling in both lower legs and feet. No evidence of foreign body or soft tissue emphysema. Electronically Signed   By: Elsie Perone M.D.   On: 01/24/2024 11:41   DG Tibia/Fibula Left Result Date: 01/24/2024 CLINICAL DATA:  pain  Patient reports bilateral foot blisters after taking doxycycline . Leg swelling and  pain. EXAM: LEFT FOOT - COMPLETE 3+ VIEW; RIGHT FOOT COMPLETE - 3+ VIEW; LEFT TIBIA AND FIBULA - 2 VIEW; RIGHT TIBIA AND FIBULA - 2 VIEW COMPARISON:  None Available. FINDINGS: Right lower leg: Mild osseous demineralization. No evidence of acute fracture, dislocation or bone destruction. There are mild degenerative changes at the right knee and ankle. Mild generalized subcutaneous edema without evidence of foreign body or soft tissue emphysema. Scattered vascular calcifications. Left lower leg: Mild osseous demineralization. No evidence of acute fracture, dislocation or bone destruction. There are mild degenerative changes at the knee and ankle. Generalized subcutaneous edema without evidence of foreign body or soft tissue emphysema. Scattered vascular calcifications. Bilateral feet: The bones are demineralized. There are transverse sclerotic bands traversing the 2nd through 5th metatarsal heads bilaterally, suspicious for chronic stress fractures. No displaced fracture, dislocation or bone destruction identified. There are mild degenerative changes at the 1st metatarsophalangeal joint bilaterally. There are midfoot degenerative changes with prominent dorsal navicular spurring on the right. Small bidirectional calcaneal spurs, larger on the left. Dorsal forefoot soft tissue swelling bilaterally. No evidence of foreign body or soft tissue emphysema. IMPRESSION: 1. No evidence of acute fracture, dislocation or bone destruction in the lower legs or feet. 2. Transverse sclerotic bands traversing the 2nd through 5th metatarsal heads bilaterally, suspicious for chronic stress fractures. Correlate clinically. 3. Nonspecific soft tissue swelling in both lower legs and feet. No evidence of foreign body or soft tissue emphysema. Electronically Signed   By: Elsie Perone M.D.   On: 01/24/2024 11:41   DG Tibia/Fibula Right Result Date:  01/24/2024 CLINICAL DATA:  pain Patient reports bilateral foot blisters after taking doxycycline . Leg swelling and pain. EXAM: LEFT FOOT - COMPLETE 3+ VIEW; RIGHT FOOT COMPLETE - 3+ VIEW; LEFT TIBIA AND FIBULA - 2 VIEW; RIGHT TIBIA AND FIBULA - 2 VIEW COMPARISON:  None Available. FINDINGS: Right lower leg: Mild osseous demineralization. No evidence of acute fracture, dislocation or bone destruction. There are mild degenerative changes at the right knee and ankle. Mild generalized subcutaneous edema without evidence of foreign body or soft tissue emphysema. Scattered vascular calcifications. Left lower leg: Mild osseous demineralization. No evidence of acute fracture, dislocation or bone destruction. There are mild degenerative changes at the knee and ankle. Generalized subcutaneous edema without evidence of foreign body or soft tissue emphysema. Scattered vascular calcifications. Bilateral feet: The bones are demineralized. There are transverse sclerotic bands traversing the 2nd through 5th metatarsal heads bilaterally, suspicious for chronic stress fractures. No displaced fracture, dislocation or bone destruction identified. There are mild degenerative changes at the 1st metatarsophalangeal joint bilaterally. There are midfoot degenerative changes with prominent dorsal navicular spurring on the right. Small bidirectional calcaneal spurs, larger on the left. Dorsal forefoot soft tissue swelling bilaterally. No evidence of foreign body or soft tissue emphysema. IMPRESSION: 1. No evidence of acute fracture, dislocation or bone destruction in the lower legs or feet. 2. Transverse sclerotic bands traversing the 2nd through 5th metatarsal heads bilaterally, suspicious for chronic stress fractures. Correlate clinically. 3. Nonspecific soft tissue swelling in both lower legs and feet. No evidence of foreign body or soft tissue emphysema. Electronically Signed   By: Elsie Perone M.D.   On: 01/24/2024 11:41     Labs:    Basic Metabolic Panel: Recent Labs  Lab 01/28/24 0502 01/29/24 0502 01/30/24 0451 01/31/24 1217 02/01/24 0528  NA 139 139 139 137 136  K 3.8 4.1 4.0 4.7 4.0  CL 101 101 99 97* 98  CO2  26 26 26 28 27   GLUCOSE 126* 150* 149* 196* 178*  BUN 47* 45* 47* 53* 53*  CREATININE 2.19* 2.26* 2.50* 2.65* 2.45*  CALCIUM  9.2 9.1 9.2 9.2 9.2  MG 1.8  --   --   --   --    GFR Estimated Creatinine Clearance: 22.1 mL/min (A) (by C-G formula based on SCr of 2.45 mg/dL (H)). Liver Function Tests: No results for input(s): AST, ALT, ALKPHOS, BILITOT, PROT, ALBUMIN in the last 168 hours. No results for input(s): LIPASE, AMYLASE in the last 168 hours. No results for input(s): AMMONIA in the last 168 hours. Coagulation profile No results for input(s): INR, PROTIME in the last 168 hours.  CBC: Recent Labs  Lab 01/28/24 0502 01/29/24 0502 01/30/24 0451  WBC 9.3 9.6 8.4  NEUTROABS 7.3  --   --   HGB 10.4* 9.8* 9.7*  HCT 33.7* 32.5* 33.5*  MCV 85.3 86.9 88.2  PLT 296 287 270   Cardiac Enzymes: No results for input(s): CKTOTAL, CKMB, CKMBINDEX, TROPONINI in the last 168 hours. BNP: Invalid input(s): POCBNP CBG: Recent Labs  Lab 02/02/24 0747 02/02/24 1210 02/02/24 1707 02/02/24 2256 02/03/24 0756  GLUCAP 207* 156* 167* 171* 179*   D-Dimer No results for input(s): DDIMER in the last 72 hours. Hgb A1c No results for input(s): HGBA1C in the last 72 hours. Lipid Profile No results for input(s): CHOL, HDL, LDLCALC, TRIG, CHOLHDL, LDLDIRECT in the last 72 hours. Thyroid function studies No results for input(s): TSH, T4TOTAL, T3FREE, THYROIDAB in the last 72 hours.  Invalid input(s): FREET3 Anemia work up No results for input(s): VITAMINB12, FOLATE, FERRITIN, TIBC, IRON , RETICCTPCT in the last 72 hours. Microbiology Recent Results (from the past 240 hours)  Culture, blood (routine x 2)     Status: None    Collection Time: 01/24/24 11:14 AM   Specimen: BLOOD LEFT FOREARM  Result Value Ref Range Status   Specimen Description   Final    BLOOD LEFT FOREARM Performed at [facility], [address] [place], [place] [address]    Special Requests   Final    BOTTLES DRAWN AEROBIC AND ANAEROBIC Blood Culture adequate volume Performed at [facility], [address] [place], [place] [address]    Culture   Final    NO GROWTH 5 DAYS Performed at [facility], [address], [place], [place] [address]    Report Status 01/30/2024 FINAL  Final  Culture, blood (routine x 2)     Status: None   Collection Time: 01/24/24 11:35 AM   Specimen: BLOOD  Result Value Ref Range Status   Specimen Description   Final    BLOOD RIGHT ANTECUBITAL Performed at [facility], [address] [place], [place] [address]    Special Requests   Final    BOTTLES DRAWN AEROBIC AND ANAEROBIC Blood Culture results may not be optimal due to an inadequate volume of blood received in culture bottles Performed at [facility], [address], [place], [place] [address]    Culture   Final    NO GROWTH 5 DAYS Performed at [facility], [address], [place], [place] [address]    Report Status 01/29/2024 FINAL  Final     Discharge Instructions:   Discharge Instructions     (HEART FAILURE PATIENTS) Call MD:  Anytime you have any of the following symptoms: 1) 3 pound weight gain in 24 hours or 5 pounds  in 1 week 2) shortness of breath, with or without a dry hacking cough 3) swelling in the hands, feet or stomach 4) if you have to sleep on extra pillows at night in order to breathe.   Complete by: As directed    Diet - low sodium heart healthy   Complete by: As directed    Diet Carb Modified   Complete by: As directed    Heart Failure patients record your daily weight using the same scale at the same time of day   Complete by: As directed     Increase activity slowly   Complete by: As directed       Allergies as of 02/03/2024       Reactions   Nsaids Swelling   Hands and legs swell   Penicillins Hives   Has patient had a PCN reaction causing immediate rash, facial/tongue/throat swelling, SOB or lightheadedness with hypotension: Yes Has patient had a PCN reaction causing severe rash involving mucus membranes or skin necrosis: No Has patient had a PCN reaction that required hospitalization: No Has patient had a PCN reaction occurring within the last 10 years: No If all of the above answers are NO, then may proceed with Cephalosporin use.   Quinapril  Hcl Other (See Comments)   Other reaction(s): elevated potassium and creatinine        Medication List     STOP taking these medications    azithromycin  250 MG tablet Commonly known as: ZITHROMAX    bisoprolol  5 MG tablet Commonly known as: ZEBETA    doxycycline  100 MG tablet Commonly known as: VIBRA -TABS   ferrous sulfate  325 (65 FE) MG tablet   Insulin  Syringe-Needle U-100 31G X 5/16 1 ML Misc   meclizine 12.5 MG tablet Commonly known as: ANTIVERT   predniSONE 10 MG tablet Commonly known as: DELTASONE       TAKE these medications    acetaminophen  500 MG tablet Commonly known as: TYLENOL  Take 500 mg by mouth every 6 (six) hours as needed for mild pain (pain score 1-3) or moderate pain (pain score 4-6).   albuterol  108 (90 Base) MCG/ACT inhaler Commonly known as: VENTOLIN  HFA Inhale 2 puffs into the lungs every 4 (four) hours as needed for wheezing or shortness of breath.   allopurinol  100 MG tablet Commonly known as: ZYLOPRIM  Take 100 mg by mouth in the morning.   amLODipine  5 MG tablet Commonly known as: NORVASC  Take 5 mg by mouth every morning.   apixaban  5 MG Tabs tablet Commonly known as: Eliquis  Take 1 tablet (5 mg total) by mouth 2 (two) times daily.   atorvastatin  80 MG tablet Commonly known as: LIPITOR Take 1 tablet (80 mg total)  by mouth at bedtime.   diclofenac  Sodium 1 % Gel Commonly known as: VOLTAREN  Apply 2 g topically 4 (four) times daily.   gabapentin 100 MG capsule Commonly known as: NEURONTIN Take 1 capsule (100 mg total) by mouth 2 (two) times daily.   Gerhardt's butt cream Crea Apply 1 Application topically 2 (two) times daily.   HYDROcodone -acetaminophen  5-325 MG tablet Commonly known as: NORCO/VICODIN Take 1-2 tablets by mouth every 4 (four) hours as needed for moderate pain (pain score 4-6) or severe pain (pain score 7-10). What changed:  how much to take when to take this reasons to take this   insulin  NPH-regular Human (70-30) 100 UNIT/ML injection Inject 30 Units into the skin 2 (two) times daily with a meal. Inject 30 units into the skin in  the morning and 60 units into the skin in the evening What changed:  how much to take additional instructions   ipratropium-albuterol  0.5-2.5 (3) MG/3ML Soln Commonly known as: DUONEB Take 3 mLs by nebulization every 6 (six) hours as needed (SOB).   linezolid 600 MG tablet Commonly known as: ZYVOX Take 1 tablet (600 mg total) by mouth every 12 (twelve) hours.   loratadine  10 MG tablet Commonly known as: CLARITIN  Take 10 mg by mouth in the morning.   metolazone 2.5 MG tablet Commonly known as: ZAROXOLYN Take 1 tablet (2.5 mg total) by mouth daily as needed (edema). What changed: reasons to take this   pantoprazole  40 MG tablet Commonly known as: PROTONIX  Take 1 tablet (40 mg total) by mouth 2 (two) times daily.   sucralfate 1 GM/10ML suspension Commonly known as: CARAFATE Take 10 mLs (1 g total) by mouth 4 (four) times daily -  with meals and at bedtime.   tiZANidine 4 MG tablet Commonly known as: ZANAFLEX Take 1 tablet (4 mg total) by mouth as needed for muscle spasms.   Torsemide  60 MG Tabs Take 60 mg by mouth daily. What changed:  medication strength how much to take when to take this additional instructions   Trelegy  Ellipta 100-62.5-25 MCG/ACT Aepb Generic drug: Fluticasone -Umeclidin-Vilant INHALE 1 PUFF INTO THE LUNGS DAILY        Contact information for after-discharge care     Destination     [facility] .   Service: Skilled Nursing Contact information: [address] [place]  [address] [phone]                      Time coordinating discharge: 45 min  Signed:  Harlene RAYMOND Bowl DO  [facility] 02/03/2024, 10:45 AM

## 2024-02-03 NOTE — Care Management Important Message (Signed)
 Important Message  Patient Details IM Letter given Name: Paula Wheeler MRN: [id number] Date of Birth: 1950/05/15   Important Message Given:  Yes - Medicare IM     [name] 02/03/2024, 9:05 AM

## 2024-02-03 NOTE — TOC Progression Note (Addendum)
 Transition of Care [facility]) - Progression Note    Patient Details  Name: Paula Wheeler MRN: [id number] Date of Birth: [birth year]  [facility]) CM/SW Contact  Doneta Glenys DASEN, RN Phone Number: 02/03/2024, 9:07 AM  Clinical Narrative:    CM called [facility] for update on insurance auth. Left message. 10:48 AM [facility] returned call with follow up on insurance auth. Still no insurance auth from [employer]. They have 72 hr turn around and didn't receive until late Friday. Best case by Wednesday latest Thursday. CM informed Dr. Juvenal, J. 12:28 PM Patient has been updated on the delay of transfer to [facility].  Expected Discharge Plan: Skilled Nursing Facility Barriers to Discharge: Insurance Authorization  Expected Discharge Plan and Services In-house Referral: Clinical Social Work   Post Acute Care Choice: Skilled Nursing Facility Living arrangements for the past 2 months: Single Family Home Expected Discharge Date: 02/02/24               DME Arranged: N/A DME Agency: NA                   Social Drivers of Health (SDOH) Interventions SDOH Screenings   Food Insecurity: No Food Insecurity (01/24/2024)  Housing: Low Risk  (01/24/2024)  Transportation Needs: No Transportation Needs (01/24/2024)  Utilities: Not At Risk (01/24/2024)  Social Connections: Socially Isolated (01/24/2024)  Tobacco Use: High Risk (01/26/2024)    Readmission Risk Interventions    01/26/2024    8:31 AM  Readmission Risk Prevention Plan  Transportation Screening Complete  HRI or Home Care Consult Complete  Social Work Consult for Recovery Care Planning/Counseling Complete  Palliative Care Screening Not Applicable  Medication Review Oceanographer) Complete

## 2024-02-03 NOTE — Plan of Care (Signed)
  Problem: Education: Goal: Knowledge of General Education information will improve Description: Including pain rating scale, medication(s)/side effects and non-pharmacologic comfort measures Outcome: Progressing   Problem: Health Behavior/Discharge Planning: Goal: Ability to manage health-related needs will improve Outcome: Progressing   Problem: Clinical Measurements: Goal: Ability to maintain clinical measurements within normal limits will improve Outcome: Progressing Goal: Will remain free from infection Outcome: Progressing Goal: Diagnostic test results will improve Outcome: Progressing Goal: Respiratory complications will improve Outcome: Progressing Goal: Cardiovascular complication will be avoided Outcome: Progressing   Problem: Activity: Goal: Risk for activity intolerance will decrease Outcome: Progressing   Problem: Nutrition: Goal: Adequate nutrition will be maintained Outcome: Progressing   Problem: Coping: Goal: Level of anxiety will decrease Outcome: Progressing   Problem: Elimination: Goal: Will not experience complications related to bowel motility Outcome: Progressing Goal: Will not experience complications related to urinary retention Outcome: Progressing   Problem: Pain Managment: Goal: General experience of comfort will improve and/or be controlled Outcome: Progressing   Problem: Safety: Goal: Ability to remain free from injury will improve Outcome: Progressing   Problem: Skin Integrity: Goal: Risk for impaired skin integrity will decrease Outcome: Progressing   Problem: Education: Goal: Ability to describe self-care measures that may prevent or decrease complications (Diabetes Survival Skills Education) will improve Outcome: Progressing Goal: Individualized Educational Video(s) Outcome: Progressing   Problem: Coping: Goal: Ability to adjust to condition or change in health will improve Outcome: Progressing   Problem: Fluid  Volume: Goal: Ability to maintain a balanced intake and output will improve Outcome: Progressing   Problem: Health Behavior/Discharge Planning: Goal: Ability to identify and utilize available resources and services will improve Outcome: Progressing Goal: Ability to manage health-related needs will improve Outcome: Progressing   Problem: Metabolic: Goal: Ability to maintain appropriate glucose levels will improve Outcome: Progressing   Problem: Nutritional: Goal: Maintenance of adequate nutrition will improve Outcome: Progressing Goal: Progress toward achieving an optimal weight will improve Outcome: Progressing   Problem: Skin Integrity: Goal: Risk for impaired skin integrity will decrease Outcome: Progressing   Problem: Tissue Perfusion: Goal: Adequacy of tissue perfusion will improve Outcome: Progressing   Problem: Clinical Measurements: Goal: Ability to avoid or minimize complications of infection will improve Outcome: Progressing   Problem: Skin Integrity: Goal: Skin integrity will improve Outcome: Progressing

## 2024-02-03 NOTE — Plan of Care (Signed)
  Problem: Education: Goal: Knowledge of General Education information will improve Description: Including pain rating scale, medication(s)/side effects and non-pharmacologic comfort measures Outcome: Progressing   Problem: Clinical Measurements: Goal: Ability to maintain clinical measurements within normal limits will improve Outcome: Progressing Goal: Will remain free from infection Outcome: Progressing Goal: Respiratory complications will improve Outcome: Progressing   Problem: Activity: Goal: Risk for activity intolerance will decrease Outcome: Progressing   Problem: Coping: Goal: Level of anxiety will decrease Outcome: Progressing   Problem: Elimination: Goal: Will not experience complications related to bowel motility Outcome: Progressing   Problem: Pain Managment: Goal: General experience of comfort will improve and/or be controlled Outcome: Progressing   Problem: Pain Managment: Goal: General experience of comfort will improve and/or be controlled Outcome: Progressing

## 2024-02-04 LAB — GLUCOSE, CAPILLARY
Glucose-Capillary: 137 mg/dL — ABNORMAL HIGH (ref 70–99)
Glucose-Capillary: 161 mg/dL — ABNORMAL HIGH (ref 70–99)
Glucose-Capillary: 160 mg/dL — ABNORMAL HIGH (ref 70–99)
Glucose-Capillary: 140 mg/dL — ABNORMAL HIGH (ref 70–99)

## 2024-02-04 MED ORDER — POLYETHYLENE GLYCOL 3350 17 G PO PACK
17.0000 g | PACK | Freq: Every day | ORAL | Status: DC
Start: 2024-02-04 — End: 2024-02-05
  Administered 2024-02-04 – 2024-02-05 (×2): 17 g via ORAL
  Filled 2024-02-04 (×2): qty 1

## 2024-02-04 MED ORDER — LACTULOSE 10 GM/15ML PO SOLN
20.0000 g | Freq: Every day | ORAL | Status: DC
  Administered 2024-02-04 – 2024-02-05 (×2): 20 g via ORAL
  Filled 2024-02-04 (×2): qty 30

## 2024-02-04 MED ORDER — HYDROCODONE-ACETAMINOPHEN 5-325 MG PO TABS
1.0000 | ORAL_TABLET | Freq: Four times a day (QID) | ORAL | Status: DC | PRN
Start: 2024-02-04 — End: 2024-02-05
  Administered 2024-02-04 – 2024-02-05 (×2): 2 via ORAL
  Filled 2024-02-04 (×2): qty 2

## 2024-02-04 MED ORDER — SENNOSIDES-DOCUSATE SODIUM 8.6-50 MG PO TABS
1.0000 | ORAL_TABLET | Freq: Two times a day (BID) | ORAL | Status: DC
  Administered 2024-02-04 – 2024-02-05 (×2): 1 via ORAL
  Filled 2024-02-04 (×2): qty 1

## 2024-02-04 NOTE — Hospital Course (Signed)
 PAULENE TAYAG is a 73 y.o. female with medical history significant for CKD stage IV, prior upper GI bleed, longstanding persistent atrial fibrillation on Eliquis , type 2 diabetes being admitted to the hospital with sepsis due to bilateral lower extremity cellulitis found to have acute on chronic anemia.  Patient states that for the last couple of weeks she has been feeling fatigued, she also has chronic lower extremity mild edema and erythema which has worsened.  She recently took a course of p.o. doxycycline  to treat presumed cellulitis, however she developed worsening discomfort and blisters draining clear fluid.  She denies any fevers or chills, denies any abdominal pain, nausea or vomiting.  Denies any hematochezia, or melena states that her bowel movements have been normal.  She has continued to take her home Eliquis , the last dose was this morning about 3 AM.  Came to the emergency department for evaluation of her lower extremities, she has been started on empiric IV antibiotics to cover for cellulitis was also incidentally found to have a hemoglobin of 5.2.  2 unit blood transfusion has been ordered, and hospitalist admission was requested.

## 2024-02-04 NOTE — Plan of Care (Signed)
   Problem: Education: Goal: Knowledge of General Education information will improve Description: Including pain rating scale, medication(s)/side effects and non-pharmacologic comfort measures Outcome: Progressing   Problem: Clinical Measurements: Goal: Ability to maintain clinical measurements within normal limits will improve Outcome: Progressing

## 2024-02-04 NOTE — TOC Progression Note (Addendum)
 Transition of Care [facility]) - Progression Note    Patient Details  Name: Paula Wheeler MRN: [id number] Date of Birth: [birth year]  Transition of Care [facility]) CM/SW Contact  Doneta Glenys DASEN, RN Phone Number: 02/04/2024, 10:14 AM  Clinical Narrative:    CM has reached out to [facility] for update on insurance auth.  4:09 PM Still no auth  Expected Discharge Plan: Skilled Nursing Facility Barriers to Discharge: Insurance Authorization               Expected Discharge Plan and Services In-house Referral: Clinical Social Work   Post Acute Care Choice: Skilled Nursing Facility Living arrangements for the past 2 months: Single Family Home Expected Discharge Date: 02/02/24               DME Arranged: N/A DME Agency: NA                   Social Drivers of Health (SDOH) Interventions SDOH Screenings   Food Insecurity: No Food Insecurity (01/24/2024)  Housing: Low Risk  (01/24/2024)  Transportation Needs: No Transportation Needs (01/24/2024)  Utilities: Not At Risk (01/24/2024)  Social Connections: Socially Isolated (01/24/2024)  Tobacco Use: High Risk (01/26/2024)    Readmission Risk Interventions    01/26/2024    8:31 AM  Readmission Risk Prevention Plan  Transportation Screening Complete  HRI or Home Care Consult Complete  Social Work Consult for Recovery Care Planning/Counseling Complete  Palliative Care Screening Not Applicable  Medication Review Oceanographer) Complete

## 2024-02-04 NOTE — Progress Notes (Signed)
 Progress Note   Patient: Paula Wheeler FMW:[id number] DOB: [birth year] DOA: 01/24/2024     11 DOS: the patient was seen and examined on 02/04/2024   Brief hospital course: TERESSA MCGLOCKLIN is a 73 y.o. female with medical history significant for CKD stage IV, prior upper GI bleed, longstanding persistent atrial fibrillation on Eliquis , type 2 diabetes being admitted to the hospital with sepsis due to bilateral lower extremity cellulitis found to have acute on chronic anemia.  Patient states that for the last couple of weeks she has been feeling fatigued, she also has chronic lower extremity mild edema and erythema which has worsened.  She recently took a course of p.o. doxycycline  to treat presumed cellulitis, however she developed worsening discomfort and blisters draining clear fluid.  She denies any fevers or chills, denies any abdominal pain, nausea or vomiting.  Denies any hematochezia, or melena states that her bowel movements have been normal.  She has continued to take her home Eliquis , the last dose was this morning about 3 AM.  Came to the emergency department for evaluation of her lower extremities, she has been started on empiric IV antibiotics to cover for cellulitis was also incidentally found to have a hemoglobin of 5.2.  2 unit blood transfusion has been ordered, and hospitalist admission was requested.   Assessment and Plan:  #Severe sepsis secondary bilateral lower extremity cellulitis -Patient met criteria for sepsis on presentation due to tachycardia, leukocytosis, initial lactate elevated to 3.4 - Patient noted to have been on doxycycline  outpatient in the past with no significant improvement - Patient received sepsis bolus IV fluids on admission - X-ray of lower extremities negative for any acute fractures however consistent with cellulitis - Lower extremity Dopplers negative for DVT - Blood cultures showing no growth to date - Completed antibiotic therapy after receiving 2  days of IV vancomycin  followed by 6 days of linezolid  #Constipation - Patient reports not having bowel movement for 7 days - Started MiraLAX  daily and Peri-Colace twice daily - Counseled patient on need to decrease frequency of opioids, ensure that she is getting out of bed, and ensure adequate oral hydration  # Acute on chronic anemia, symptomatic # Iron  deficiency anemia #Erosive esophagitis and gastritis - Patient presented with a hemoglobin of 5.2, received total of 4 units of PRBCs - GI was consulted and patient underwent EGD on 01/26/2024 which showed LA grade C reflux esophagitis with no bleeding, gastritis which was biopsied, normal examined duodenum - Continue Carafate slurry 4 times daily - Continue oral PPI - Eliquis  resumed on 01/28/2024 per GI recommendations - Patient require follow-up with GI Dr. Dianna in 6 to 8 weeks  #Persistent A-fib - Rate controlled - Continue Eliquis   #Acute on chronic diastolic CHF -Patient noted to have bibasilar crackles on admission with lower extremity edema thought to be in part secondary to cellulitis - Last echo (07/18/2023) showed EF of 60-65, without any wall motion abnormalities, mildly dilated left atrium - proBNP elevated to 3354 - Was initially treated with IV Lasix , now back on home torsemide   #T2DM - Hemoglobin A1c 7.0 - Continue SSI  #Left lower extremity pain - Patient complains of left lower extremity pain especially on ambulation - CT scan of left hip was negative for any acute osseous abnormalities but showed moderate bilateral foraminal impingement at L4-L5 and L5-S1 due to DDD and spondylosis with borderline central stenosis at L5-S1.  Mild degenerative changes of the left hip joint.  Mild degenerative left sacroiliac  joint arthropathy.  - Continue gabapentin 100 mg twice daily - Changed Norco 5-325 mg to every 6 hours as needed  #CKD stage IV - Stable, at baseline - Follows with nephrology in outpatient  setting  #Hypertension - Continue home Norvasc   #Hyperlipidemia - Continue Lipitor 80     Subjective: Patient seen in bed.  Patient's brother Marcey was at bedside.  Patient reports persistent pain in her left lower extremity.  Additionally she reports not having a bowel movement in 7 days.  She was given MiraLAX  this morning but did not take it.  Discussed that she needs to get out of bed, take the stool softeners and laxatives as prescribed, and decrease frequency of opioid pain meds.  Denies any nausea or vomiting, abdominal pain, chest pain, shortness of breath, or worsening lower extremity swelling.  Physical Exam: Vitals:   02/04/24 0428 02/04/24 0842 02/04/24 0930 02/04/24 1221  BP: 138/60  (!) 142/60 132/63  Pulse: 78  88 88  Resp: 18   16  Temp: 97.6 F (36.4 C)   97.7 F (36.5 C)  TempSrc: Oral     SpO2: 99% 93%  99%  Weight:      Height:        Physical Exam Constitutional:      General: She is not in acute distress.    Appearance: She is not ill-appearing.  HENT:     Mouth/Throat:     Mouth: Mucous membranes are moist.  Cardiovascular:     Rate and Rhythm: Normal rate and regular rhythm.     Heart sounds: Normal heart sounds. No murmur heard. Pulmonary:     Effort: Pulmonary effort is normal. No respiratory distress.     Breath sounds: Normal breath sounds. No wheezing.  Abdominal:     General: Bowel sounds are normal. There is no distension.     Palpations: Abdomen is soft.     Tenderness: There is no abdominal tenderness. There is no guarding.  Musculoskeletal:     Right lower leg: Edema (trace) present.     Left lower leg: Edema (trace) present.     Comments: Bilateral lower extremities and heel lift boots.  Right lower extremity with dressing in place.  Left lower extremity with mild erythema anteriorly.  Skin:    General: Skin is warm and dry.     Capillary Refill: Capillary refill takes less than 2 seconds.  Neurological:     Mental Status: She is  alert and oriented to person, place, and time. Mental status is at baseline.     Family Communication: Discussed with the patient and her brother, Marcey, at bedside.  Disposition: Status is: Inpatient  Planned Discharge Destination: Skilled nursing facility, pending insurance authorization    Time spent: 45 minutes  Author: Duffy Larch, MD 02/04/2024 6:32 PM  For on call review [email or web address].

## 2024-02-05 LAB — CBC
HCT: 35.4 % — ABNORMAL LOW (ref 36.0–46.0)
Hemoglobin: 9.7 g/dL — ABNORMAL LOW (ref 12.0–15.0)
MCH: 25.4 pg — ABNORMAL LOW (ref 26.0–34.0)
MCHC: 27.4 g/dL — ABNORMAL LOW (ref 30.0–36.0)
MCV: 92.7 fL (ref 80.0–100.0)
Platelets: 263 K/uL (ref 150–400)
RBC: 3.82 MIL/uL — ABNORMAL LOW (ref 3.87–5.11)
RDW: 20.3 % — ABNORMAL HIGH (ref 11.5–15.5)
WBC: 9.5 K/uL (ref 4.0–10.5)
nRBC: 0 % (ref 0.0–0.2)

## 2024-02-05 LAB — BASIC METABOLIC PANEL WITH GFR
Anion gap: 14 (ref 5–15)
BUN: 62 mg/dL — ABNORMAL HIGH (ref 8–23)
CO2: 26 mmol/L (ref 22–32)
Calcium: 9.5 mg/dL (ref 8.9–10.3)
Chloride: 98 mmol/L (ref 98–111)
Creatinine, Ser: 2.27 mg/dL — ABNORMAL HIGH (ref 0.44–1.00)
GFR, Estimated: 22 mL/min — ABNORMAL LOW (ref >60)
Glucose, Bld: 136 mg/dL — ABNORMAL HIGH (ref 70–99)
Potassium: 4.1 mmol/L (ref 3.5–5.1)
Sodium: 137 mmol/L (ref 135–145)

## 2024-02-05 LAB — GLUCOSE, CAPILLARY
Glucose-Capillary: 131 mg/dL — ABNORMAL HIGH (ref 70–99)
Glucose-Capillary: 173 mg/dL — ABNORMAL HIGH (ref 70–99)

## 2024-02-05 MED ORDER — POLYETHYLENE GLYCOL 3350 17 G PO PACK: 17.0000 g | PACK | Freq: Every day | ORAL | Status: DC

## 2024-02-05 MED ORDER — LACTULOSE 10 GM/15ML PO SOLN: 20.0000 g | Freq: Every day | ORAL | Status: DC

## 2024-02-05 MED ORDER — SENNOSIDES-DOCUSATE SODIUM 8.6-50 MG PO TABS: 1.0000 | ORAL_TABLET | Freq: Two times a day (BID) | ORAL | Status: DC

## 2024-02-05 NOTE — Progress Notes (Deleted)
 Progress Note   Patient: Paula Wheeler FMW:[id number] DOB: [birth year] DOA: 01/24/2024     12 DOS: the patient was seen and examined on 02/05/2024   Brief hospital course: Paula Wheeler is a 73 y.o. female with medical history significant for CKD stage IV, prior upper GI bleed, longstanding persistent atrial fibrillation on Eliquis , type 2 diabetes being admitted to the hospital with sepsis due to bilateral lower extremity cellulitis found to have acute on chronic anemia.  Patient states that for the last couple of weeks she has been feeling fatigued, she also has chronic lower extremity mild edema and erythema which has worsened.  She recently took a course of p.o. doxycycline  to treat presumed cellulitis, however she developed worsening discomfort and blisters draining clear fluid.  She denies any fevers or chills, denies any abdominal pain, nausea or vomiting.  Denies any hematochezia, or melena states that her bowel movements have been normal.  She has continued to take her home Eliquis , the last dose was this morning about 3 AM.  Came to the emergency department for evaluation of her lower extremities, she has been started on empiric IV antibiotics to cover for cellulitis was also incidentally found to have a hemoglobin of 5.2.  2 unit blood transfusion has been ordered, and hospitalist admission was requested.   Assessment and Plan:  #Severe sepsis secondary bilateral lower extremity cellulitis -Patient met criteria for sepsis on presentation due to tachycardia, leukocytosis, initial lactate elevated to 3.4 - Patient noted to have been on doxycycline  outpatient in the past with no significant improvement - Patient received sepsis bolus IV fluids on admission - X-ray of lower extremities negative for any acute fractures however consistent with cellulitis - Lower extremity Dopplers negative for DVT - Blood cultures showing no growth to date - Completed antibiotic therapy after receiving 2  days of IV vancomycin  followed by 6 days of linezolid  #Constipation - Patient reports not having bowel movement for 8 days - Continue MiraLAX  daily and Peri-Colace twice daily (started on 10/8). Patient requesting to hold off lactulose for now - Counseled patient on need to decrease frequency of opioids, ensure that she is getting out of bed, and ensure adequate oral hydration  # Acute on chronic anemia, symptomatic # Iron  deficiency anemia # Erosive esophagitis and gastritis - Patient presented with a hemoglobin of 5.2, received total of 4 units of PRBCs - GI was consulted and patient underwent EGD on 01/26/2024 which showed LA grade C reflux esophagitis with no bleeding, gastritis which was biopsied, normal examined duodenum - Continue Carafate slurry 4 times daily - Continue oral PPI - Eliquis  resumed on 01/28/2024 per GI recommendations - Patient require follow-up with GI Dr. Dianna in 6 to 8 weeks - Hgb 9.7 today  #Persistent A-fib - Rate controlled - Continue Eliquis   #Acute on chronic diastolic CHF -Patient noted to have bibasilar crackles on admission with lower extremity edema thought to be in part secondary to cellulitis - Last echo (07/18/2023) showed EF of 60-65, without any wall motion abnormalities, mildly dilated left atrium - proBNP elevated to 3354 - Was initially treated with IV Lasix , now back on home torsemide  60 mg daily  #T2DM - Hemoglobin A1c 7.0 - Continue SSI  #Left lower extremity pain - Patient complains of left lower extremity pain especially on ambulation - CT scan of left hip was negative for any acute osseous abnormalities but showed moderate bilateral foraminal impingement at L4-L5 and L5-S1 due to DDD and spondylosis with  borderline central stenosis at L5-S1.  Mild degenerative changes of the left hip joint.  Mild degenerative left sacroiliac joint arthropathy.  - Continue gabapentin 100 mg twice daily - Continue Norco 5-325 mg to every 6 hours as  needed  #CKD stage IV - Stable Cr at baseline - Follows with nephrology in outpatient setting  #Hypertension - Continue home Norvasc   #Hyperlipidemia - Continue Lipitor 80     Subjective: Patient seen in bed. No acute events overnight per patient or RN staff. Patient reports persistent pain in her left lower extremity most notably on ambulation with PT.  Additionally she continues to report not having a bowel movement for 8 days.  She has taken MiraLAX  this morning, wants to trial that before moving onto lactulose.  Discussed that she needs to get out of bed, take the stool softeners and laxatives as prescribed, and decrease frequency of opioid pain meds.  Denies any nausea or vomiting, abdominal pain, chest pain, shortness of breath, or worsening lower extremity swelling.  Physical Exam: Vitals:   02/04/24 0930 02/04/24 1221 02/04/24 1955 02/05/24 0517  BP: (!) 142/60 132/63 (!) 129/55 139/62  Pulse: 88 88 89 88  Resp:  16 18   Temp:  97.7 F (36.5 C) 97.9 F (36.6 C) 98.6 F (37 C)  TempSrc:   Oral   SpO2:  99% 96% 95%  Weight:      Height:        Physical Exam Constitutional:      General: She is not in acute distress.    Appearance: She is not ill-appearing.  HENT:     Mouth/Throat:     Mouth: Mucous membranes are moist.  Cardiovascular:     Rate and Rhythm: Normal rate and regular rhythm.     Heart sounds: Normal heart sounds. No murmur heard. Pulmonary:     Effort: Pulmonary effort is normal. No respiratory distress.     Breath sounds: Normal breath sounds. No wheezing.  Abdominal:     General: Bowel sounds are normal. There is no distension.     Palpations: Abdomen is soft.     Tenderness: There is no abdominal tenderness. There is no guarding.  Musculoskeletal:     Right lower leg: Edema (trace) present.     Left lower leg: Edema (trace) present.     Comments: Right lower extremity with dressing in place.  Bilateral lower extremities with mild erythema  anteriorly.  Skin:    General: Skin is warm and dry.     Capillary Refill: Capillary refill takes less than 2 seconds.  Neurological:     Mental Status: She is alert and oriented to person, place, and time. Mental status is at baseline.     Family Communication: Discussed with the patient.   Disposition: Status is: Inpatient  Planned Discharge Destination: Skilled nursing facility, pending insurance authorization    DVT Prophylaxis: Place and maintain sequential compression device Start: 01/25/24 1544 SCDs Start: 01/24/24 1452 apixaban  (ELIQUIS ) tablet 5 mg    Time spent: 24 minutes  Author: Ger Nicks Al-Sultani, MD 02/05/2024 5:45 AM  For on call review [email or web address].

## 2024-02-05 NOTE — Plan of Care (Addendum)
 RN called [facility] at [phone] to give report for pt going into room 144.  Phone answered, nurse is in a meeting, [facility] will call [facility]W back for report  ----- [facility] here to tranport pt.  Recalled [employer]; no answer  ----  Recalled [facility]; secretary answered, RN left message that [facility] on their way to [employer].  No nurse available to take report.

## 2024-02-05 NOTE — Discharge Summary (Signed)
 Physician Discharge Summary   Patient: Paula Wheeler MRN: [id number] DOB: Jul 12, 1950  Admit date:     01/24/2024  Discharge date: 02/05/24  Discharge Physician: Duffy Al-Sultani   PCP: Okey Carlin Redbird, MD   Recommendations at discharge:   Follow-up with gastroenterology, Dr. Dianna in 6 to 8 weeks Follow-up with PCP after discharge from SNF. Monitor blood sugars and adjust insulin  as needed. Follow-up with nephrology, Dr. Gearline, for changes in diuretic management to avoid lower extremity edema  Discharge Diagnoses: Principal Problem:   Sepsis due to cellulitis [facility]) Active Problems:   Symptomatic anemia   Diabetes mellitus with complication (HCC)   Essential hypertension   HLD (hyperlipidemia)   Persistent atrial fibrillation   PAD (peripheral artery disease)   Type II diabetes mellitus with renal manifestations (HCC)   GERD (gastroesophageal reflux disease)   Depression   CKD (chronic kidney disease) stage 4, GFR 15-29 ml/min (HCC)   Chronic diastolic CHF (congestive heart failure) (HCC)   Pressure injury of skin   Anemia, iron  deficiency   Acute on chronic diastolic congestive heart failure [facility])   Hospital Course: Paula Wheeler is a 73 y.o. female with medical history significant for CKD stage IV, prior upper GI bleed, longstanding persistent atrial fibrillation on Eliquis , type 2 diabetes being admitted to the hospital with sepsis due to bilateral lower extremity cellulitis found to have acute on chronic anemia.  Patient states that for the last couple of weeks she has been feeling fatigued, she also has chronic lower extremity mild edema and erythema which has worsened.  She recently took a course of p.o. doxycycline  to treat presumed cellulitis, however she developed worsening discomfort and blisters draining clear fluid.  She denies any fevers or chills, denies any abdominal pain, nausea or vomiting.  Denies any hematochezia, or melena states that her bowel  movements have been normal.  She has continued to take her home Eliquis , the last dose was this morning about 3 AM.  Came to the emergency department for evaluation of her lower extremities, she has been started on empiric IV antibiotics to cover for cellulitis was also incidentally found to have a hemoglobin of 5.2.  2 unit blood transfusion has been ordered, and hospitalist admission was requested.   Assessment and Plan:  #Severe sepsis secondary bilateral lower extremity cellulitis - Patient met criteria for sepsis on presentation due to tachycardia, leukocytosis, initial lactate elevated to 3.4 - Patient noted to have been on doxycycline  outpatient in the past with no significant improvement - Patient received sepsis bolus IV fluids on admission - X-ray of lower extremities negative for any acute fractures however consistent with cellulitis - Lower extremity Dopplers negative for DVT - Blood cultures showing no growth to date - Completed antibiotic therapy after receiving 2 days of IV vancomycin  followed by 6 days of linezolid   #Constipation - Patient reports not having bowel movement for 8 days - Continue MiraLAX  daily and Peri-Colace twice daily. Added lactulose as well.  - Counseled patient on need to decrease frequency of opioids, ensure that she is getting out of bed, and ensure adequate oral hydration   # Acute on chronic anemia, symptomatic # Iron  deficiency anemia # Erosive esophagitis and gastritis - Patient presented with a hemoglobin of 5.2, received total of 4 units of PRBCs - GI was consulted and patient underwent EGD on 01/26/2024 which showed LA grade C reflux esophagitis with no bleeding, gastritis which was biopsied, normal examined duodenum - Continue Carafate slurry  4 times daily - Continue oral PPI - Eliquis  resumed on 01/28/2024 per GI recommendations - Patient require follow-up with GI Dr. Dianna in 6 to 8 weeks - Hgb stable at 9.7 at the time of discharge    #Persistent A-fib - Rate controlled - Eliquis  continued   #Acute on chronic diastolic CHF -Patient noted to have bibasilar crackles on admission with lower extremity edema thought to be in part secondary to cellulitis - Last echo (07/18/2023) showed EF of 60-65, without any wall motion abnormalities, mildly dilated left atrium - proBNP elevated to 3354 - Was initially treated with IV Lasix , now back on home torsemide  60 mg daily   #T2DM - Hemoglobin A1c 7.0 - Maintained on SSI while inpatient - On discharge, switched back to lower dose of home regiment with 30 units in the AM and 60 units in the PM   #Left lower extremity pain - Patient complained of left lower extremity pain especially on ambulation with PT - CT scan of left hip was negative for any acute osseous abnormalities but showed moderate bilateral foraminal impingement at L4-L5 and L5-S1 due to DDD and spondylosis with borderline central stenosis at L5-S1.  Mild degenerative changes of the left hip joint.  Mild degenerative left sacroiliac joint arthropathy.  - Continue gabapentin 100 mg twice daily - Continue Norco 5-325 mg to every 4 hours as needed   #CKD stage IV - Stable Cr at baseline - Follows with nephrology in outpatient setting   #Hypertension - Continue home Norvasc    #Hyperlipidemia - Continue Lipitor 80   Pain control -   Controlled Substance Reporting System database was reviewed. and patient was instructed, not to drive, operate heavy machinery, perform activities at heights, swimming or participation in water  activities or provide baby-sitting services while on Pain, Sleep and Anxiety Medications; until their outpatient Physician has advised to do so again. Also recommended to not to take more than prescribed Pain, Sleep and Anxiety Medications.  Consultants: Gastroenterology Procedures performed: EGD Disposition: Skilled nursing facility Diet recommendation:  Discharge Diet Orders (From  admission, onward)     Start     Ordered   02/02/24 0000  Diet - low sodium heart healthy        02/02/24 1058   02/02/24 0000  Diet Carb Modified        02/02/24 1058           Carb modified diet DISCHARGE MEDICATION: Allergies as of 02/05/2024       Reactions   Nsaids Swelling   Hands and legs swell   Penicillins Hives   Has patient had a PCN reaction causing immediate rash, facial/tongue/throat swelling, SOB or lightheadedness with hypotension: Yes Has patient had a PCN reaction causing severe rash involving mucus membranes or skin necrosis: No Has patient had a PCN reaction that required hospitalization: No Has patient had a PCN reaction occurring within the last 10 years: No If all of the above answers are NO, then may proceed with Cephalosporin use.   Quinapril  Hcl Other (See Comments)   Other reaction(s): elevated potassium and creatinine        Medication List     STOP taking these medications    azithromycin  250 MG tablet Commonly known as: ZITHROMAX    bisoprolol  5 MG tablet Commonly known as: ZEBETA    doxycycline  100 MG tablet Commonly known as: VIBRA -TABS   ferrous sulfate  325 (65 FE) MG tablet   Insulin  Syringe-Needle U-100 31G X 5/16 1 ML Misc  meclizine 12.5 MG tablet Commonly known as: ANTIVERT   predniSONE 10 MG tablet Commonly known as: DELTASONE       TAKE these medications    acetaminophen  500 MG tablet Commonly known as: TYLENOL  Take 500 mg by mouth every 6 (six) hours as needed for mild pain (pain score 1-3) or moderate pain (pain score 4-6).   albuterol  108 (90 Base) MCG/ACT inhaler Commonly known as: VENTOLIN  HFA Inhale 2 puffs into the lungs every 4 (four) hours as needed for wheezing or shortness of breath.   allopurinol  100 MG tablet Commonly known as: ZYLOPRIM  Take 100 mg by mouth in the morning.   amLODipine  5 MG tablet Commonly known as: NORVASC  Take 5 mg by mouth every morning.   apixaban  5 MG Tabs  tablet Commonly known as: Eliquis  Take 1 tablet (5 mg total) by mouth 2 (two) times daily.   atorvastatin  80 MG tablet Commonly known as: LIPITOR Take 1 tablet (80 mg total) by mouth at bedtime.   diclofenac  Sodium 1 % Gel Commonly known as: VOLTAREN  Apply 2 g topically 4 (four) times daily.   gabapentin 100 MG capsule Commonly known as: NEURONTIN Take 1 capsule (100 mg total) by mouth 2 (two) times daily.   Gerhardt's butt cream Crea Apply 1 Application topically 2 (two) times daily.   HYDROcodone -acetaminophen  5-325 MG tablet Commonly known as: NORCO/VICODIN Take 1-2 tablets by mouth every 4 (four) hours as needed for moderate pain (pain score 4-6) or severe pain (pain score 7-10). What changed:  how much to take when to take this reasons to take this   insulin  NPH-regular Human (70-30) 100 UNIT/ML injection Inject 30 Units into the skin 2 (two) times daily with a meal. Inject 30 units into the skin in the morning and 60 units into the skin in the evening What changed:  how much to take additional instructions   ipratropium-albuterol  0.5-2.5 (3) MG/3ML Soln Commonly known as: DUONEB Take 3 mLs by nebulization every 6 (six) hours as needed (SOB).   lactulose 10 GM/15ML solution Commonly known as: CHRONULAC Take 30 mLs (20 g total) by mouth daily. Start taking on: February 06, 2024   loratadine  10 MG tablet Commonly known as: CLARITIN  Take 10 mg by mouth in the morning.   metolazone 2.5 MG tablet Commonly known as: ZAROXOLYN Take 1 tablet (2.5 mg total) by mouth daily as needed (edema). What changed: reasons to take this   pantoprazole  40 MG tablet Commonly known as: PROTONIX  Take 1 tablet (40 mg total) by mouth 2 (two) times daily.   polyethylene glycol 17 g packet Commonly known as: MIRALAX  / GLYCOLAX  Take 17 g by mouth daily. Start taking on: February 06, 2024   senna-docusate 8.6-50 MG tablet Commonly known as: Senokot-S Take 1 tablet by mouth 2 (two)  times daily.   sucralfate 1 GM/10ML suspension Commonly known as: CARAFATE Take 10 mLs (1 g total) by mouth 4 (four) times daily -  with meals and at bedtime.   tiZANidine 4 MG tablet Commonly known as: ZANAFLEX Take 1 tablet (4 mg total) by mouth as needed for muscle spasms.   Torsemide  60 MG Tabs Take 60 mg by mouth daily. What changed:  medication strength how much to take when to take this additional instructions   Trelegy Ellipta  100-62.5-25 MCG/ACT Aepb Generic drug: Fluticasone -Umeclidin-Vilant INHALE 1 PUFF INTO THE LUNGS DAILY               Discharge Care Instructions  (From admission,  onward)           Start     Ordered   02/05/24 0000  Discharge wound care:       Comments: L heel and top of right foot: Cleanse with Vashe #[id number], not rinse. Pat dry. Apply a single layer of Xeroform to the wound bed daily and cover with foam dressing. Ok to lift and reapply a new Xeroform dressing. The foam can be change every 3 days or PRN. Right leg - Cleanse with saline, pat dry. Apply a single layer of Xeroform to the wound bed daily and cover with foam dressing. Ok to lift and reapply a new Xeroform dressing. The foam can be change every 3 days or PRN. Bilateral legs - Moist with barrier cream once a day (available in clean utility).  Leave legs open to air.   Apply Gerhardts to buttocks wounds twice daily.   02/05/24 1041            Contact information for follow-up providers     Dianna Specking, MD Follow up in 6 week(s).   Specialty: Gastroenterology Contact information: [address] [place] [address] [phone]         Okey Carlin Redbird, MD. Schedule an appointment as soon as possible for a visit.   Specialty: Family Medicine Why: After discharge from SNF Contact information: [address] [place] [address] [phone]              Contact information for after-discharge care     Destination      [facility] .   Service: Skilled Nursing Contact information: [address] [place]  [address] [phone]                    Discharge Exam: Fredricka Weights   02/01/24 0500 02/03/24 0423 02/05/24 0500  Weight: 81.6 kg 82.1 kg 86.3 kg   Constitutional:      General: She is not in acute distress.    Appearance: She is not ill-appearing.  HENT:     Mouth/Throat:     Mouth: Mucous membranes are moist.  Cardiovascular:     Rate and Rhythm: Normal rate and regular rhythm.     Heart sounds: Normal heart sounds. No murmur heard. Pulmonary:     Effort: Pulmonary effort is normal. No respiratory distress.     Breath sounds: Normal breath sounds. No wheezing.  Abdominal:     General: Bowel sounds are normal. There is no distension.     Palpations: Abdomen is soft.     Tenderness: There is no abdominal tenderness. There is no guarding.  Musculoskeletal:     Right lower leg: Edema (trace) present.     Left lower leg: Edema (trace) present.     Comments: Right lower extremity with dressing in place.  Bilateral lower extremities with mild erythema anteriorly.  Skin:    General: Skin is warm and dry.     Capillary Refill: Capillary refill takes less than 2 seconds.  Neurological:     Mental Status: She is alert and oriented to person, place, and time. Mental status is at baseline.   Condition at discharge: fair  The results of significant diagnostics from this hospitalization (including imaging, microbiology, ancillary and laboratory) are listed below for reference.   Imaging Studies: CT HIP LEFT WO CONTRAST Result Date: 01/28/2024 EXAM: CT OF THE LEFT HIP WITHOUT IV CONTRAST 01/28/2024 05:58:00 PM TECHNIQUE: CT of the left hip  was performed without the administration of intravenous contrast. Multiplanar reformatted images are provided for review. Automated exposure control, iterative reconstruction, and/or weight based adjustment of the mA/kV was utilized  to reduce the radiation dose to as low as reasonably achievable. COMPARISON: Femur radiographs 01/27/2024 and CT pelvis 09/27/2023. CLINICAL HISTORY: Chronic hip pain, no prior imaging. FINDINGS: BONES: No acute fracture or dislocation. No left hemipelvic fracture. No aggressive appearing osseous abnormality or periostitis. Mild sclerosis along the sacral side of the SI joint with mild degenerative SI joint arthropathy on the left. Occlusion of the lower lumbar spine demonstrates moderate bilateral foraminal impingement at L4-L5 and L5-S1 due to degenerative disc disease and spondylosis. There is also borderline central stenosis at L5-S1. SOFT TISSUE: Regional muscular atrophy likely from sarcopenia. This is more pronounced in the gluteus minimus muscle. Subcutaneous edema lateral to the left hip and tracking down the lateral left thigh, and also along the medial left thigh. Mild nonspecific presacral edema. No soft tissue mass. No compelling findings of regional bursitis. JOINT: Mild degenerative spurring of the left femoral head and acetabulum with mild craniocaudad and axial degenerative chondral thinning. No hip joint effusion. No osseous erosions. INTRAPELVIC CONTENTS: Systemic atherosclerosis is present, including the aorta and iliac arteries. Limited images of the intrapelvic contents are otherwise unremarkable. IMPRESSION: 1. Moderate bilateral foraminal impingement at L4-5 and L5-S1 due to degenerative disc disease and spondylosis, with borderline central stenosis at L5-S1. 2. Mild degenerative changes in the left hip joint. 3. Mild degenerative left sacroiliac joint arthropathy. 4. Subcutaneous edema along the lateral and medial left thigh. 5. Regional muscular atrophy, more pronounced in the gluteus minimus. 6. Systemic atherosclerosis involving the aorta and iliac arteries. 7. Mild nonspecific presacral edema. 8. No acute osseous abnormality. Electronically signed by: Ryan Salvage MD 01/28/2024  06:12 PM EDT RP Workstation: [id number]   DG FEMUR 1V LEFT Result Date: 01/27/2024 CLINICAL DATA:  Pain. EXAM: LEFT FEMUR 1 VIEW COMPARISON:  None Available. FINDINGS: Divided frontal views of the left femur. No fracture. No evidence of focal bone abnormality or erosion. No hip or knee dislocation. Vascular calcifications are seen. IMPRESSION: No acute findings. Vascular calcifications. Electronically Signed   By: Andrea Gasman M.D.   On: 01/27/2024 17:16   VAS US  LOWER EXTREMITY VENOUS (DVT) (ONLY [facility] & [facility]) Result Date: 01/25/2024  Lower Venous DVT Study Patient Name:  LORRANE MCCAY  Date of Exam:   01/25/2024 Medical Rec #: [id number]          Accession #:    [id number] Date of Birth: 06-Sep-1950          Patient Gender: F Patient Age:   34 years Exam Location:  [facility] Procedure:      VAS US  LOWER EXTREMITY VENOUS (DVT) Referring Phys: MAUDE MESSICK --------------------------------------------------------------------------------  Indications: Pain, Swelling, Edema, and redness and blisters on both lower extremities. Other Indications: Bilateral lower cellulitis. Comparison Study: 08/03/21 Negative LEV Performing Technologist: Ricka Sturdivant-Jones RDMS, RVT  Examination Guidelines: A complete evaluation includes B-mode imaging, spectral Doppler, color Doppler, and power Doppler as needed of all accessible portions of each vessel. Bilateral testing is considered an integral part of a complete examination. Limited examinations for reoccurring indications may be performed as noted. The reflux portion of the exam is performed with the patient in reverse Trendelenburg.  +---------+---------------+---------+-----------+----------+--------------+ RIGHT    CompressibilityPhasicitySpontaneityPropertiesThrombus Aging +---------+---------------+---------+-----------+----------+--------------+ CFV      Full           Yes  Yes                                  +---------+---------------+---------+-----------+----------+--------------+ SFJ      Full                                                        +---------+---------------+---------+-----------+----------+--------------+ FV Prox  Full                                                        +---------+---------------+---------+-----------+----------+--------------+ FV Mid   Full           Yes      Yes                                 +---------+---------------+---------+-----------+----------+--------------+ FV DistalFull                                                        +---------+---------------+---------+-----------+----------+--------------+ PFV      Full                                                        +---------+---------------+---------+-----------+----------+--------------+ POP      Full           Yes      Yes                                 +---------+---------------+---------+-----------+----------+--------------+ PTV      Full                                                        +---------+---------------+---------+-----------+----------+--------------+ PERO     Full                                                        +---------+---------------+---------+-----------+----------+--------------+   +---------+---------------+---------+-----------+----------+---------------+ LEFT     CompressibilityPhasicitySpontaneityPropertiesThrombus Aging  +---------+---------------+---------+-----------+----------+---------------+ CFV      Full           Yes      Yes                                  +---------+---------------+---------+-----------+----------+---------------+ SFJ      Full                                                         +---------+---------------+---------+-----------+----------+---------------+  FV Prox  Full                                                          +---------+---------------+---------+-----------+----------+---------------+ FV Mid                  Yes      Yes                  Patent by color +---------+---------------+---------+-----------+----------+---------------+ FV Distal               Yes      Yes                  Patent by color +---------+---------------+---------+-----------+----------+---------------+ PFV      Full                                                         +---------+---------------+---------+-----------+----------+---------------+ POP                     Yes      Yes                  Patent by color +---------+---------------+---------+-----------+----------+---------------+ PTV                                                   Patent by color +---------+---------------+---------+-----------+----------+---------------+ PERO                                                  Patent by color +---------+---------------+---------+-----------+----------+---------------+ Left leg was too painful to touch and compress with US  probe. No evidence of DVT with color imaging.  Summary: RIGHT: - There is no evidence of deep vein thrombosis in the lower extremity.  - No cystic structure found in the popliteal fossa. - Ultrasound characteristics of enlarged lymph nodes are noted in the groin.  LEFT: - There is no evidence of deep vein thrombosis in the lower extremity.  - No cystic structure found in the popliteal fossa. - Ultrasound characteristics of enlarged lymph nodes noted in the groin.  *See table(s) above for measurements and observations. Electronically signed by Lonni Gaskins MD on 01/25/2024 at 12:08:49 PM.    Final    DG Chest Port 1 View Result Date: 01/24/2024 EXAM: 1 VIEW(S) XRAY OF THE CHEST 01/24/2024 01:08:00 PM COMPARISON: 09/27/2023 CLINICAL HISTORY: weakness. Weakness in legs FINDINGS: LUNGS AND PLEURA: No focal pulmonary opacity. No pulmonary edema. There is a small to moderate  right pleural effusion and small left effusion. No pneumothorax. Bibasilar atelectasis. HEART AND MEDIASTINUM: Cardiomegaly, unchanged. Aortic calcification. BONES AND SOFT TISSUES: No acute osseous abnormality. IMPRESSION: 1. Small to moderate right pleural effusion and small left pleural effusion. 2. Bibasilar atelectasis. 3. Cardiomegaly, unchanged. 4. Aortic atherosclerosis. Electronically signed by: Waddell Calk MD 01/24/2024 01:29 PM EDT RP Workstation: [id number]  DG Foot Complete Left Result Date: 01/24/2024 CLINICAL DATA:  pain Patient reports bilateral foot blisters after taking doxycycline . Leg swelling and pain. EXAM: LEFT FOOT - COMPLETE 3+ VIEW; RIGHT FOOT COMPLETE - 3+ VIEW; LEFT TIBIA AND FIBULA - 2 VIEW; RIGHT TIBIA AND FIBULA - 2 VIEW COMPARISON:  None Available. FINDINGS: Right lower leg: Mild osseous demineralization. No evidence of acute fracture, dislocation or bone destruction. There are mild degenerative changes at the right knee and ankle. Mild generalized subcutaneous edema without evidence of foreign body or soft tissue emphysema. Scattered vascular calcifications. Left lower leg: Mild osseous demineralization. No evidence of acute fracture, dislocation or bone destruction. There are mild degenerative changes at the knee and ankle. Generalized subcutaneous edema without evidence of foreign body or soft tissue emphysema. Scattered vascular calcifications. Bilateral feet: The bones are demineralized. There are transverse sclerotic bands traversing the 2nd through 5th metatarsal heads bilaterally, suspicious for chronic stress fractures. No displaced fracture, dislocation or bone destruction identified. There are mild degenerative changes at the 1st metatarsophalangeal joint bilaterally. There are midfoot degenerative changes with prominent dorsal navicular spurring on the right. Small bidirectional calcaneal spurs, larger on the left. Dorsal forefoot soft tissue swelling bilaterally. No  evidence of foreign body or soft tissue emphysema. IMPRESSION: 1. No evidence of acute fracture, dislocation or bone destruction in the lower legs or feet. 2. Transverse sclerotic bands traversing the 2nd through 5th metatarsal heads bilaterally, suspicious for chronic stress fractures. Correlate clinically. 3. Nonspecific soft tissue swelling in both lower legs and feet. No evidence of foreign body or soft tissue emphysema. Electronically Signed   By: Elsie Perone M.D.   On: 01/24/2024 11:41   DG Foot Complete Right Result Date: 01/24/2024 CLINICAL DATA:  pain Patient reports bilateral foot blisters after taking doxycycline . Leg swelling and pain. EXAM: LEFT FOOT - COMPLETE 3+ VIEW; RIGHT FOOT COMPLETE - 3+ VIEW; LEFT TIBIA AND FIBULA - 2 VIEW; RIGHT TIBIA AND FIBULA - 2 VIEW COMPARISON:  None Available. FINDINGS: Right lower leg: Mild osseous demineralization. No evidence of acute fracture, dislocation or bone destruction. There are mild degenerative changes at the right knee and ankle. Mild generalized subcutaneous edema without evidence of foreign body or soft tissue emphysema. Scattered vascular calcifications. Left lower leg: Mild osseous demineralization. No evidence of acute fracture, dislocation or bone destruction. There are mild degenerative changes at the knee and ankle. Generalized subcutaneous edema without evidence of foreign body or soft tissue emphysema. Scattered vascular calcifications. Bilateral feet: The bones are demineralized. There are transverse sclerotic bands traversing the 2nd through 5th metatarsal heads bilaterally, suspicious for chronic stress fractures. No displaced fracture, dislocation or bone destruction identified. There are mild degenerative changes at the 1st metatarsophalangeal joint bilaterally. There are midfoot degenerative changes with prominent dorsal navicular spurring on the right. Small bidirectional calcaneal spurs, larger on the left. Dorsal forefoot soft  tissue swelling bilaterally. No evidence of foreign body or soft tissue emphysema. IMPRESSION: 1. No evidence of acute fracture, dislocation or bone destruction in the lower legs or feet. 2. Transverse sclerotic bands traversing the 2nd through 5th metatarsal heads bilaterally, suspicious for chronic stress fractures. Correlate clinically. 3. Nonspecific soft tissue swelling in both lower legs and feet. No evidence of foreign body or soft tissue emphysema. Electronically Signed   By: Elsie Perone M.D.   On: 01/24/2024 11:41   DG Tibia/Fibula Left Result Date: 01/24/2024 CLINICAL DATA:  pain Patient reports bilateral foot blisters after taking doxycycline . Leg swelling and pain.  EXAM: LEFT FOOT - COMPLETE 3+ VIEW; RIGHT FOOT COMPLETE - 3+ VIEW; LEFT TIBIA AND FIBULA - 2 VIEW; RIGHT TIBIA AND FIBULA - 2 VIEW COMPARISON:  None Available. FINDINGS: Right lower leg: Mild osseous demineralization. No evidence of acute fracture, dislocation or bone destruction. There are mild degenerative changes at the right knee and ankle. Mild generalized subcutaneous edema without evidence of foreign body or soft tissue emphysema. Scattered vascular calcifications. Left lower leg: Mild osseous demineralization. No evidence of acute fracture, dislocation or bone destruction. There are mild degenerative changes at the knee and ankle. Generalized subcutaneous edema without evidence of foreign body or soft tissue emphysema. Scattered vascular calcifications. Bilateral feet: The bones are demineralized. There are transverse sclerotic bands traversing the 2nd through 5th metatarsal heads bilaterally, suspicious for chronic stress fractures. No displaced fracture, dislocation or bone destruction identified. There are mild degenerative changes at the 1st metatarsophalangeal joint bilaterally. There are midfoot degenerative changes with prominent dorsal navicular spurring on the right. Small bidirectional calcaneal spurs, larger on the  left. Dorsal forefoot soft tissue swelling bilaterally. No evidence of foreign body or soft tissue emphysema. IMPRESSION: 1. No evidence of acute fracture, dislocation or bone destruction in the lower legs or feet. 2. Transverse sclerotic bands traversing the 2nd through 5th metatarsal heads bilaterally, suspicious for chronic stress fractures. Correlate clinically. 3. Nonspecific soft tissue swelling in both lower legs and feet. No evidence of foreign body or soft tissue emphysema. Electronically Signed   By: Elsie Perone M.D.   On: 01/24/2024 11:41   DG Tibia/Fibula Right Result Date: 01/24/2024 CLINICAL DATA:  pain Patient reports bilateral foot blisters after taking doxycycline . Leg swelling and pain. EXAM: LEFT FOOT - COMPLETE 3+ VIEW; RIGHT FOOT COMPLETE - 3+ VIEW; LEFT TIBIA AND FIBULA - 2 VIEW; RIGHT TIBIA AND FIBULA - 2 VIEW COMPARISON:  None Available. FINDINGS: Right lower leg: Mild osseous demineralization. No evidence of acute fracture, dislocation or bone destruction. There are mild degenerative changes at the right knee and ankle. Mild generalized subcutaneous edema without evidence of foreign body or soft tissue emphysema. Scattered vascular calcifications. Left lower leg: Mild osseous demineralization. No evidence of acute fracture, dislocation or bone destruction. There are mild degenerative changes at the knee and ankle. Generalized subcutaneous edema without evidence of foreign body or soft tissue emphysema. Scattered vascular calcifications. Bilateral feet: The bones are demineralized. There are transverse sclerotic bands traversing the 2nd through 5th metatarsal heads bilaterally, suspicious for chronic stress fractures. No displaced fracture, dislocation or bone destruction identified. There are mild degenerative changes at the 1st metatarsophalangeal joint bilaterally. There are midfoot degenerative changes with prominent dorsal navicular spurring on the right. Small bidirectional  calcaneal spurs, larger on the left. Dorsal forefoot soft tissue swelling bilaterally. No evidence of foreign body or soft tissue emphysema. IMPRESSION: 1. No evidence of acute fracture, dislocation or bone destruction in the lower legs or feet. 2. Transverse sclerotic bands traversing the 2nd through 5th metatarsal heads bilaterally, suspicious for chronic stress fractures. Correlate clinically. 3. Nonspecific soft tissue swelling in both lower legs and feet. No evidence of foreign body or soft tissue emphysema. Electronically Signed   By: Elsie Perone M.D.   On: 01/24/2024 11:41    Microbiology: Results for orders placed or performed during the hospital encounter of 01/24/24  Culture, blood (routine x 2)     Status: None   Collection Time: 01/24/24 11:14 AM   Specimen: BLOOD LEFT FOREARM  Result Value Ref Range Status  Specimen Description   Final    BLOOD LEFT FOREARM Performed at [facility], [address], [place], [place] [address]    Special Requests   Final    BOTTLES DRAWN AEROBIC AND ANAEROBIC Blood Culture adequate volume Performed at [facility], [address], [place], [place] [address]    Culture   Final    NO GROWTH 5 DAYS Performed at [facility], [address], [place], [place] [address]    Report Status 01/30/2024 FINAL  Final  Culture, blood (routine x 2)     Status: None   Collection Time: 01/24/24 11:35 AM   Specimen: BLOOD  Result Value Ref Range Status   Specimen Description   Final    BLOOD RIGHT ANTECUBITAL Performed at [facility], [address], [place], [place] [address]    Special Requests   Final    BOTTLES DRAWN AEROBIC AND ANAEROBIC Blood Culture results may not be optimal due to an inadequate volume of blood received in culture bottles Performed at [facility], [address] [place], [place] [address]    Culture   Final    NO GROWTH 5 DAYS Performed at [facility], [address] [place], [place] [address]    Report Status 01/29/2024 FINAL  Final    Labs: CBC: Recent Labs  Lab 01/30/24 0451 02/05/24 0623  WBC 8.4 9.5  HGB 9.7* 9.7*  HCT 33.5* 35.4*  MCV 88.2 92.7  PLT 270 263   Basic Metabolic Panel: Recent Labs  Lab 01/30/24 0451 01/31/24 1217 02/01/24 0528 02/05/24 0623  NA 139 137 136 137  K 4.0 4.7 4.0 4.1  CL 99 97* 98 98  CO2 26 28 27 26   GLUCOSE 149* 196* 178* 136*  BUN 47* 53* 53* 62*  CREATININE 2.50* 2.65* 2.45* 2.27*  CALCIUM  9.2 9.2 9.2 9.5   Liver Function Tests: No results for input(s): AST, ALT, ALKPHOS, BILITOT, PROT, ALBUMIN in the last 168 hours. CBG: Recent Labs  Lab 02/04/24 0822 02/04/24 1217 02/04/24 1650 02/04/24 2139 02/05/24 0742  GLUCAP 137* 161* 160* 140* 131*    Discharge time spent: 35 minutes.  Signed: Jerie Basford Al-Sultani, MD [facility] 02/05/2024

## 2024-02-05 NOTE — Progress Notes (Signed)
 Mobility Specialist - Progress Note   02/05/24 0934  Mobility  Activity Pivoted/transferred from bed to chair  Level of Assistance Moderate assist, patient does 50-74%  Assistive Device Front wheel walker  Range of Motion/Exercises Active  Activity Response Tolerated fair  Mobility Referral Yes  Mobility visit 1 Mobility  Mobility Specialist Start Time (ACUTE ONLY) 0920  Mobility Specialist Stop Time (ACUTE ONLY) 0934  Mobility Specialist Time Calculation (min) (ACUTE ONLY) 14 min   Pt was found in bed and agreeable to mobilize. Mod-A for bed mobility and min-A for sit to stand. C/o 10/10 LE pain once transfer complete. RN notified pt wanting muscle relaxer. At EOS was left with all needs met. Call bell in reach.   Paula Wheeler,  Mobility Specialist Can be reached via Secure Chat

## 2024-02-05 NOTE — TOC Transition Note (Signed)
 Transition of Care [facility]) - Discharge Note   Patient Details  Name: Paula Wheeler MRN: [id number] Date of Birth: 11/14/1950  Transition of Care [facility] CM/SW Contact:  Doneta Glenys DASEN, RN Phone Number: 02/05/2024, 11:19 AM   Clinical Narrative:    Per MD patient ready for DC to [facility] . RN to call report prior to discharge [phone] Rm [phone]). RN, patient, patient's family, and facility notified of DC. Discharge Summary and FL2 sent to facility via the HUB. DC packet on chart face sheet, medical necessity, discharge summary and 2 signed prescriptions. Ambulance PTAR transport requested for patient.   CM sign off. Please consult us  again if new needs arise.      Final next level of care: Skilled Nursing Facility Barriers to Discharge: Barriers Resolved   Patient Goals and CMS Choice Patient states their goals for this hospitalization and ongoing recovery are:: Rehab CMS Medicare.gov Compare Post Acute Care list provided to:: Patient Choice offered to / list presented to : Patient [place] ownership interest in [facility].provided to:: Patient    Discharge Placement   Existing PASRR number confirmed : 02/05/24          Patient chooses bed at:  [facility])   Name of family member notified: [name] Patient and family notified of of transfer: 02/05/24  Discharge Plan and Services Additional resources added to the After Visit Summary for   In-house Referral: Clinical Social Work   Post Acute Care Choice: Skilled Nursing Facility          DME Arranged: N/A DME Agency: NA                  Social Drivers of Health (SDOH) Interventions SDOH Screenings   Food Insecurity: No Food Insecurity (01/24/2024)  Housing: Low Risk  (01/24/2024)  Transportation Needs: No Transportation Needs (01/24/2024)  Utilities: Not At Risk (01/24/2024)  Social Connections: Socially Isolated (01/24/2024)  Tobacco Use: High Risk (01/26/2024)     Readmission Risk  Interventions    01/26/2024    8:31 AM  Readmission Risk Prevention Plan  Transportation Screening Complete  HRI or Home Care Consult Complete  Social Work Consult for Recovery Care Planning/Counseling Complete  Palliative Care Screening Not Applicable  Medication Review Oceanographer) Complete

## 2024-02-15 ENCOUNTER — Emergency Department (HOSPITAL_COMMUNITY)

## 2024-02-15 ENCOUNTER — Inpatient Hospital Stay (HOSPITAL_COMMUNITY)
Admission: EM | Admit: 2024-02-15 | Discharge: 2024-02-19 | DRG: 871 | Disposition: A | Discharge status: Hospice | Source: Skilled Nursing Facility | Attending: Pulmonary Disease | Admitting: Pulmonary Disease

## 2024-02-15 ENCOUNTER — Other Ambulatory Visit: Payer: Self-pay

## 2024-02-15 ENCOUNTER — Encounter (HOSPITAL_COMMUNITY): Payer: Self-pay

## 2024-02-15 DIAGNOSIS — D649 Anemia, unspecified: Principal | ICD-10-CM

## 2024-02-15 DIAGNOSIS — M109 Gout, unspecified: Secondary | ICD-10-CM | POA: Insufficient documentation

## 2024-02-15 DIAGNOSIS — R570 Cardiogenic shock: Secondary | ICD-10-CM | POA: Diagnosis present

## 2024-02-15 DIAGNOSIS — K922 Gastrointestinal hemorrhage, unspecified: Secondary | ICD-10-CM | POA: Diagnosis present

## 2024-02-15 DIAGNOSIS — I5032 Chronic diastolic (congestive) heart failure: Secondary | ICD-10-CM | POA: Diagnosis present

## 2024-02-15 DIAGNOSIS — F17210 Nicotine dependence, cigarettes, uncomplicated: Secondary | ICD-10-CM | POA: Diagnosis present

## 2024-02-15 DIAGNOSIS — D631 Anemia in chronic kidney disease: Secondary | ICD-10-CM | POA: Diagnosis present

## 2024-02-15 DIAGNOSIS — K2971 Gastritis, unspecified, with bleeding: Secondary | ICD-10-CM | POA: Diagnosis present

## 2024-02-15 DIAGNOSIS — R7989 Other specified abnormal findings of blood chemistry: Secondary | ICD-10-CM | POA: Diagnosis present

## 2024-02-15 DIAGNOSIS — Z833 Family history of diabetes mellitus: Secondary | ICD-10-CM

## 2024-02-15 DIAGNOSIS — Z711 Person with feared health complaint in whom no diagnosis is made: Secondary | ICD-10-CM

## 2024-02-15 DIAGNOSIS — N170 Acute kidney failure with tubular necrosis: Secondary | ICD-10-CM | POA: Diagnosis present

## 2024-02-15 DIAGNOSIS — K2091 Esophagitis, unspecified with bleeding: Secondary | ICD-10-CM | POA: Diagnosis present

## 2024-02-15 DIAGNOSIS — I130 Hypertensive heart and chronic kidney disease with heart failure and stage 1 through stage 4 chronic kidney disease, or unspecified chronic kidney disease: Secondary | ICD-10-CM | POA: Diagnosis present

## 2024-02-15 DIAGNOSIS — N189 Chronic kidney disease, unspecified: Secondary | ICD-10-CM | POA: Diagnosis not present

## 2024-02-15 DIAGNOSIS — R52 Pain, unspecified: Secondary | ICD-10-CM | POA: Diagnosis not present

## 2024-02-15 DIAGNOSIS — I10 Essential (primary) hypertension: Secondary | ICD-10-CM | POA: Diagnosis present

## 2024-02-15 DIAGNOSIS — E6609 Other obesity due to excess calories: Secondary | ICD-10-CM | POA: Diagnosis present

## 2024-02-15 DIAGNOSIS — K219 Gastro-esophageal reflux disease without esophagitis: Secondary | ICD-10-CM | POA: Diagnosis present

## 2024-02-15 DIAGNOSIS — R9431 Abnormal electrocardiogram [ECG] [EKG]: Secondary | ICD-10-CM | POA: Diagnosis present

## 2024-02-15 DIAGNOSIS — E785 Hyperlipidemia, unspecified: Secondary | ICD-10-CM | POA: Diagnosis present

## 2024-02-15 DIAGNOSIS — J449 Chronic obstructive pulmonary disease, unspecified: Secondary | ICD-10-CM | POA: Diagnosis not present

## 2024-02-15 DIAGNOSIS — I2489 Other forms of acute ischemic heart disease: Secondary | ICD-10-CM | POA: Diagnosis present

## 2024-02-15 DIAGNOSIS — L89313 Pressure ulcer of right buttock, stage 3: Secondary | ICD-10-CM | POA: Diagnosis present

## 2024-02-15 DIAGNOSIS — Z803 Family history of malignant neoplasm of breast: Secondary | ICD-10-CM

## 2024-02-15 DIAGNOSIS — I739 Peripheral vascular disease, unspecified: Secondary | ICD-10-CM | POA: Diagnosis not present

## 2024-02-15 DIAGNOSIS — K5900 Constipation, unspecified: Secondary | ICD-10-CM | POA: Diagnosis not present

## 2024-02-15 DIAGNOSIS — Z720 Tobacco use: Secondary | ICD-10-CM | POA: Diagnosis present

## 2024-02-15 DIAGNOSIS — A419 Sepsis, unspecified organism: Principal | ICD-10-CM | POA: Diagnosis present

## 2024-02-15 DIAGNOSIS — Z7901 Long term (current) use of anticoagulants: Secondary | ICD-10-CM

## 2024-02-15 DIAGNOSIS — J441 Chronic obstructive pulmonary disease with (acute) exacerbation: Secondary | ICD-10-CM | POA: Diagnosis present

## 2024-02-15 DIAGNOSIS — Z79899 Other long term (current) drug therapy: Secondary | ICD-10-CM | POA: Diagnosis not present

## 2024-02-15 DIAGNOSIS — Z7951 Long term (current) use of inhaled steroids: Secondary | ICD-10-CM

## 2024-02-15 DIAGNOSIS — E861 Hypovolemia: Secondary | ICD-10-CM | POA: Diagnosis present

## 2024-02-15 DIAGNOSIS — I4819 Other persistent atrial fibrillation: Secondary | ICD-10-CM | POA: Diagnosis present

## 2024-02-15 DIAGNOSIS — E1151 Type 2 diabetes mellitus with diabetic peripheral angiopathy without gangrene: Secondary | ICD-10-CM | POA: Diagnosis present

## 2024-02-15 DIAGNOSIS — Z886 Allergy status to analgesic agent status: Secondary | ICD-10-CM

## 2024-02-15 DIAGNOSIS — F419 Anxiety disorder, unspecified: Secondary | ICD-10-CM | POA: Diagnosis not present

## 2024-02-15 DIAGNOSIS — R579 Shock, unspecified: Secondary | ICD-10-CM | POA: Diagnosis not present

## 2024-02-15 DIAGNOSIS — E440 Moderate protein-calorie malnutrition: Secondary | ICD-10-CM | POA: Diagnosis present

## 2024-02-15 DIAGNOSIS — E8721 Acute metabolic acidosis: Secondary | ICD-10-CM | POA: Diagnosis present

## 2024-02-15 DIAGNOSIS — L89610 Pressure ulcer of right heel, unstageable: Secondary | ICD-10-CM | POA: Diagnosis present

## 2024-02-15 DIAGNOSIS — J9601 Acute respiratory failure with hypoxia: Secondary | ICD-10-CM | POA: Diagnosis present

## 2024-02-15 DIAGNOSIS — Z66 Do not resuscitate: Secondary | ICD-10-CM | POA: Diagnosis present

## 2024-02-15 DIAGNOSIS — R627 Adult failure to thrive: Secondary | ICD-10-CM | POA: Diagnosis present

## 2024-02-15 DIAGNOSIS — Z83518 Family history of other specified eye disorder: Secondary | ICD-10-CM

## 2024-02-15 DIAGNOSIS — R6521 Severe sepsis with septic shock: Secondary | ICD-10-CM | POA: Diagnosis present

## 2024-02-15 DIAGNOSIS — E119 Type 2 diabetes mellitus without complications: Secondary | ICD-10-CM | POA: Diagnosis not present

## 2024-02-15 DIAGNOSIS — Z888 Allergy status to other drugs, medicaments and biological substances status: Secondary | ICD-10-CM

## 2024-02-15 DIAGNOSIS — Z83718 Family history of other colon polyps: Secondary | ICD-10-CM

## 2024-02-15 DIAGNOSIS — Z83719 Family history of colon polyps, unspecified: Secondary | ICD-10-CM

## 2024-02-15 DIAGNOSIS — E1122 Type 2 diabetes mellitus with diabetic chronic kidney disease: Secondary | ICD-10-CM | POA: Diagnosis present

## 2024-02-15 DIAGNOSIS — R34 Anuria and oliguria: Secondary | ICD-10-CM | POA: Diagnosis present

## 2024-02-15 DIAGNOSIS — E8809 Other disorders of plasma-protein metabolism, not elsewhere classified: Secondary | ICD-10-CM | POA: Diagnosis present

## 2024-02-15 DIAGNOSIS — Z515 Encounter for palliative care: Secondary | ICD-10-CM

## 2024-02-15 DIAGNOSIS — L89620 Pressure ulcer of left heel, unstageable: Secondary | ICD-10-CM | POA: Diagnosis present

## 2024-02-15 DIAGNOSIS — J9811 Atelectasis: Secondary | ICD-10-CM | POA: Diagnosis present

## 2024-02-15 DIAGNOSIS — I4821 Permanent atrial fibrillation: Secondary | ICD-10-CM | POA: Diagnosis present

## 2024-02-15 DIAGNOSIS — I2510 Atherosclerotic heart disease of native coronary artery without angina pectoris: Secondary | ICD-10-CM | POA: Diagnosis present

## 2024-02-15 DIAGNOSIS — Z6828 Body mass index (BMI) 28.0-28.9, adult: Secondary | ICD-10-CM

## 2024-02-15 DIAGNOSIS — Z7189 Other specified counseling: Secondary | ICD-10-CM | POA: Diagnosis not present

## 2024-02-15 DIAGNOSIS — G9341 Metabolic encephalopathy: Secondary | ICD-10-CM | POA: Diagnosis present

## 2024-02-15 DIAGNOSIS — I779 Disorder of arteries and arterioles, unspecified: Secondary | ICD-10-CM | POA: Diagnosis present

## 2024-02-15 DIAGNOSIS — N179 Acute kidney failure, unspecified: Secondary | ICD-10-CM | POA: Diagnosis present

## 2024-02-15 DIAGNOSIS — R54 Age-related physical debility: Secondary | ICD-10-CM | POA: Diagnosis present

## 2024-02-15 DIAGNOSIS — I493 Ventricular premature depolarization: Secondary | ICD-10-CM | POA: Diagnosis present

## 2024-02-15 DIAGNOSIS — D62 Acute posthemorrhagic anemia: Secondary | ICD-10-CM | POA: Diagnosis not present

## 2024-02-15 DIAGNOSIS — I3139 Other pericardial effusion (noninflammatory): Secondary | ICD-10-CM | POA: Diagnosis present

## 2024-02-15 DIAGNOSIS — L89153 Pressure ulcer of sacral region, stage 3: Secondary | ICD-10-CM | POA: Diagnosis present

## 2024-02-15 DIAGNOSIS — I5033 Acute on chronic diastolic (congestive) heart failure: Secondary | ICD-10-CM | POA: Diagnosis present

## 2024-02-15 DIAGNOSIS — Z880 Allergy status to penicillin: Secondary | ICD-10-CM

## 2024-02-15 DIAGNOSIS — E1165 Type 2 diabetes mellitus with hyperglycemia: Secondary | ICD-10-CM | POA: Diagnosis present

## 2024-02-15 DIAGNOSIS — Z716 Tobacco abuse counseling: Secondary | ICD-10-CM

## 2024-02-15 DIAGNOSIS — N184 Chronic kidney disease, stage 4 (severe): Secondary | ICD-10-CM | POA: Diagnosis present

## 2024-02-15 DIAGNOSIS — Z8249 Family history of ischemic heart disease and other diseases of the circulatory system: Secondary | ICD-10-CM

## 2024-02-15 DIAGNOSIS — I4891 Unspecified atrial fibrillation: Secondary | ICD-10-CM | POA: Diagnosis not present

## 2024-02-15 DIAGNOSIS — L03115 Cellulitis of right lower limb: Secondary | ICD-10-CM | POA: Diagnosis present

## 2024-02-15 DIAGNOSIS — Z794 Long term (current) use of insulin: Secondary | ICD-10-CM

## 2024-02-15 LAB — URINALYSIS, ROUTINE W REFLEX MICROSCOPIC
Bilirubin Urine: NEGATIVE
Glucose, UA: NEGATIVE mg/dL
Hgb urine dipstick: NEGATIVE
Ketones, ur: NEGATIVE mg/dL
Leukocytes,Ua: NEGATIVE
Nitrite: NEGATIVE
Protein, ur: NEGATIVE mg/dL
Specific Gravity, Urine: 1.013 (ref 1.005–1.030)
pH: 5 (ref 5.0–8.0)

## 2024-02-15 LAB — COMPREHENSIVE METABOLIC PANEL WITH GFR
ALT: 15 U/L (ref 0–44)
AST: 33 U/L (ref 15–41)
Albumin: 2.7 g/dL — ABNORMAL LOW (ref 3.5–5.0)
Alkaline Phosphatase: 126 U/L (ref 38–126)
Anion gap: 22 — ABNORMAL HIGH (ref 5–15)
BUN: 105 mg/dL — ABNORMAL HIGH (ref 8–23)
CO2: 19 mmol/L — ABNORMAL LOW (ref 22–32)
Calcium: 9 mg/dL (ref 8.9–10.3)
Chloride: 94 mmol/L — ABNORMAL LOW (ref 98–111)
Creatinine, Ser: 2.97 mg/dL — ABNORMAL HIGH (ref 0.44–1.00)
GFR, Estimated: 16 mL/min — ABNORMAL LOW (ref >60)
Glucose, Bld: 123 mg/dL — ABNORMAL HIGH (ref 70–99)
Potassium: 3.7 mmol/L (ref 3.5–5.1)
Sodium: 136 mmol/L (ref 135–145)
Total Bilirubin: 0.8 mg/dL (ref 0.0–1.2)
Total Protein: 5.1 g/dL — ABNORMAL LOW (ref 6.5–8.1)

## 2024-02-15 LAB — CBC WITH DIFFERENTIAL/PLATELET
Abs Immature Granulocytes: 0.25 K/uL — ABNORMAL HIGH (ref 0.00–0.07)
Basophils Absolute: 0 K/uL (ref 0.0–0.1)
Basophils Relative: 0 %
Eosinophils Absolute: 0 K/uL (ref 0.0–0.5)
Eosinophils Relative: 0 %
HCT: 23.3 % — ABNORMAL LOW (ref 36.0–46.0)
Hemoglobin: 6.9 g/dL — CL (ref 12.0–15.0)
Immature Granulocytes: 1 %
Lymphocytes Relative: 4 %
Lymphs Abs: 0.9 K/uL (ref 0.7–4.0)
MCH: 25.1 pg — ABNORMAL LOW (ref 26.0–34.0)
MCHC: 29.6 g/dL — ABNORMAL LOW (ref 30.0–36.0)
MCV: 84.7 fL (ref 80.0–100.0)
Monocytes Absolute: 2 K/uL — ABNORMAL HIGH (ref 0.1–1.0)
Monocytes Relative: 8 %
Neutro Abs: 20.6 K/uL — ABNORMAL HIGH (ref 1.7–7.7)
Neutrophils Relative %: 87 %
Platelets: 242 K/uL (ref 150–400)
RBC: 2.75 MIL/uL — ABNORMAL LOW (ref 3.87–5.11)
RDW: 21.4 % — ABNORMAL HIGH (ref 11.5–15.5)
WBC: 23.7 K/uL — ABNORMAL HIGH (ref 4.0–10.5)
nRBC: 0.1 % (ref 0.0–0.2)

## 2024-02-15 LAB — HEMOGLOBIN AND HEMATOCRIT, BLOOD
HCT: 24.5 % — ABNORMAL LOW (ref 36.0–46.0)
Hemoglobin: 7.4 g/dL — ABNORMAL LOW (ref 12.0–15.0)

## 2024-02-15 LAB — PRO BRAIN NATRIURETIC PEPTIDE: Pro Brain Natriuretic Peptide: 4432 pg/mL — ABNORMAL HIGH (ref <300.0)

## 2024-02-15 LAB — MAGNESIUM: Magnesium: 2.8 mg/dL — ABNORMAL HIGH (ref 1.7–2.4)

## 2024-02-15 LAB — POC OCCULT BLOOD, ED: Fecal Occult Bld: POSITIVE — AB

## 2024-02-15 LAB — PHOSPHORUS: Phosphorus: 6.1 mg/dL — ABNORMAL HIGH (ref 2.5–4.6)

## 2024-02-15 LAB — GLUCOSE, CAPILLARY: Glucose-Capillary: 184 mg/dL — ABNORMAL HIGH (ref 70–99)

## 2024-02-15 LAB — TROPONIN T, HIGH SENSITIVITY
Troponin T High Sensitivity: 142 ng/L (ref 0–19)
Troponin T High Sensitivity: 137 ng/L (ref 0–19)

## 2024-02-15 LAB — LIPASE, BLOOD: Lipase: 13 U/L (ref 11–51)

## 2024-02-15 LAB — PREPARE RBC (CROSSMATCH)

## 2024-02-15 LAB — PROCALCITONIN: Procalcitonin: 0.76 ng/mL

## 2024-02-15 LAB — I-STAT CG4 LACTIC ACID, ED: Lactic Acid, Venous: 0.8 mmol/L (ref 0.5–1.9)

## 2024-02-15 MED ORDER — SODIUM CHLORIDE 0.9% IV SOLUTION: Freq: Once | INTRAVENOUS | Status: AC

## 2024-02-15 MED ORDER — SODIUM CHLORIDE 0.9 % IV SOLN
1.0000 g | Freq: Every day | INTRAVENOUS | Status: DC
  Filled 2024-02-15: qty 10

## 2024-02-15 MED ORDER — INSULIN ASPART 100 UNIT/ML IJ SOLN
0.0000 [IU] | Freq: Three times a day (TID) | INTRAMUSCULAR | Status: DC
  Administered 2024-02-16 (×3): 3 [IU] via SUBCUTANEOUS
  Administered 2024-02-17: 7 [IU] via SUBCUTANEOUS
  Administered 2024-02-17: 5 [IU] via SUBCUTANEOUS

## 2024-02-15 MED ORDER — CHLORHEXIDINE GLUCONATE CLOTH 2 % EX PADS
6.0000 | MEDICATED_PAD | Freq: Every day | CUTANEOUS | Status: DC
  Administered 2024-02-15 – 2024-02-18 (×4): 6 via TOPICAL

## 2024-02-15 MED ORDER — SODIUM CHLORIDE 0.9 % IV SOLN: 500.0000 mg | Freq: Once | INTRAVENOUS | Status: DC

## 2024-02-15 MED ORDER — ORAL CARE MOUTH RINSE: 15.0000 mL | OROMUCOSAL | Status: DC | PRN

## 2024-02-15 MED ORDER — LEVALBUTEROL HCL 1.25 MG/0.5ML IN NEBU
1.2500 mg | INHALATION_SOLUTION | Freq: Three times a day (TID) | RESPIRATORY_TRACT | Status: DC
  Administered 2024-02-15: 1.25 mg via RESPIRATORY_TRACT
  Filled 2024-02-15 (×2): qty 0.5

## 2024-02-15 MED ORDER — ACETAMINOPHEN 650 MG RE SUPP: 650.0000 mg | Freq: Four times a day (QID) | RECTAL | Status: DC | PRN

## 2024-02-15 MED ORDER — ONDANSETRON HCL 4 MG/2ML IJ SOLN: 4.0000 mg | Freq: Four times a day (QID) | INTRAMUSCULAR | Status: DC | PRN

## 2024-02-15 MED ORDER — FENTANYL CITRATE (PF) 50 MCG/ML IJ SOSY
50.0000 ug | PREFILLED_SYRINGE | Freq: Once | INTRAMUSCULAR | Status: AC
  Administered 2024-02-15: 50 ug via INTRAVENOUS
  Filled 2024-02-15: qty 1

## 2024-02-15 MED ORDER — ONDANSETRON HCL 4 MG PO TABS: 4.0000 mg | ORAL_TABLET | Freq: Four times a day (QID) | ORAL | Status: DC | PRN

## 2024-02-15 MED ORDER — PANTOPRAZOLE SODIUM 40 MG IV SOLR
40.0000 mg | Freq: Two times a day (BID) | INTRAVENOUS | Status: DC
  Administered 2024-02-15 – 2024-02-19 (×8): 40 mg via INTRAVENOUS
  Filled 2024-02-15 (×8): qty 10

## 2024-02-15 MED ORDER — VANCOMYCIN HCL IN DEXTROSE 1-5 GM/200ML-% IV SOLN: 1000.0000 mg | Freq: Once | INTRAVENOUS | Status: DC

## 2024-02-15 MED ORDER — HYDROMORPHONE HCL 1 MG/ML IJ SOLN
0.5000 mg | INTRAMUSCULAR | Status: DC | PRN
  Administered 2024-02-15 – 2024-02-16 (×6): 0.5 mg via INTRAVENOUS
  Filled 2024-02-15 (×6): qty 1

## 2024-02-15 MED ORDER — SODIUM CHLORIDE 0.9 % IV SOLN
500.0000 mg | Freq: Every day | INTRAVENOUS | Status: DC
  Filled 2024-02-15: qty 5

## 2024-02-15 MED ORDER — ORAL CARE MOUTH RINSE
15.0000 mL | OROMUCOSAL | Status: DC
  Administered 2024-02-15 – 2024-02-19 (×13): 15 mL via OROMUCOSAL

## 2024-02-15 MED ORDER — PANTOPRAZOLE SODIUM 40 MG IV SOLR
80.0000 mg | Freq: Once | INTRAVENOUS | Status: AC
  Administered 2024-02-15: 80 mg via INTRAVENOUS
  Filled 2024-02-15: qty 20

## 2024-02-15 MED ORDER — ACETAMINOPHEN 325 MG PO TABS
650.0000 mg | ORAL_TABLET | Freq: Four times a day (QID) | ORAL | Status: DC | PRN
Start: 2024-02-15 — End: 2024-02-19

## 2024-02-15 MED ORDER — SODIUM CHLORIDE 0.9 % IV SOLN
1.0000 g | Freq: Once | INTRAVENOUS | Status: AC
  Administered 2024-02-15: 1 g via INTRAVENOUS
  Filled 2024-02-15: qty 10

## 2024-02-15 NOTE — ED Triage Notes (Addendum)
 Patient BIB [facility] from [facility]. Recently seen for cellulitis. Called out due to altered mental status. Has been not acting her normal self since this morning. Oriented x1. Usually is x4. Is in atrial fibrillation per EMS.   EMS 20G right AC fluid

## 2024-02-15 NOTE — ED Notes (Signed)
 Blood bank has 1 unit of blood ready for this patient.  Brie,EMT-P notified.

## 2024-02-15 NOTE — ED Notes (Signed)
 Patient brief changed.

## 2024-02-15 NOTE — ED Provider Notes (Signed)
 [facility] AT [facility] Provider Note   CSN: [id number] Arrival date & time: 02/15/24  1229     Patient presents with: No chief complaint on file.   Paula Wheeler is a 73 y.o. female.   The history is provided by the patient, medical records and the EMS personnel. No language interpreter was used.     73 year old female with history of diabetes, hypertension, COPD, GI bleed, atrial fibrillation currently on Eliquis  brought here via EMS from home with concerns of potential sepsis.  Patient was previously hospitalized for sepsis due to cellulitis involving her lower extremities.  She was also found to be having acute on chronic anemia during her hospitalization.  She was hospitalized from 01/24/2024 and was discharged on 02/05/2024 to a rehab facility.  This morning staff noticed that patient appears to be more confused than her baseline.  They worry about sepsis and patient brought here for further care.  Per EMS, patient was warm to the touch, hypotensive and tachycardic with EKG shows A-fib with RVR.  Patient received 100 mL of lactic ringer fluid and brought here.  Currently patient is complaining of generalized bodyaches but history otherwise limited.  Prior to Admission medications   Medication Sig Start Date End Date Taking? Authorizing Provider  acetaminophen  (TYLENOL ) 500 MG tablet Take 500 mg by mouth every 6 (six) hours as needed for mild pain (pain score 1-3) or moderate pain (pain score 4-6).    [provider]  albuterol  (PROVENTIL  HFA;VENTOLIN  HFA) 108 (90 Base) MCG/ACT inhaler Inhale 2 puffs into the lungs every 4 (four) hours as needed for wheezing or shortness of breath.    [provider]  allopurinol  (ZYLOPRIM ) 100 MG tablet Take 100 mg by mouth in the morning.    [provider]  amLODipine  (NORVASC ) 5 MG tablet Take 5 mg by mouth every morning. 04/26/20   [provider]  apixaban  (ELIQUIS ) 5 MG TABS  tablet Take 1 tablet (5 mg total) by mouth 2 (two) times daily. 08/20/23   Nahser, Aleene PARAS, MD  atorvastatin  (LIPITOR) 80 MG tablet Take 1 tablet (80 mg total) by mouth at bedtime. 08/20/23 01/25/24  Nahser, Aleene PARAS, MD  diclofenac  Sodium (VOLTAREN ) 1 % GEL Apply 2 g topically 4 (four) times daily. 02/02/24   Vann, Jessica U, DO  Fluticasone -Umeclidin-Vilant (TRELEGY ELLIPTA ) 100-62.5-25 MCG/ACT AEPB INHALE 1 PUFF INTO THE LUNGS DAILY 12/12/23   Kara Dorn NOVAK, MD  gabapentin (NEURONTIN) 100 MG capsule Take 1 capsule (100 mg total) by mouth 2 (two) times daily. 02/02/24   Vann, Jessica U, DO  HYDROcodone -acetaminophen  (NORCO/VICODIN) 5-325 MG tablet Take 1-2 tablets by mouth every 4 (four) hours as needed for moderate pain (pain score 4-6) or severe pain (pain score 7-10). 02/02/24   Vann, Jessica U, DO  insulin  NPH-regular Human (70-30) 100 UNIT/ML injection Inject 30 Units into the skin 2 (two) times daily with a meal. Inject 30 units into the skin in the morning and 60 units into the skin in the evening 02/02/24   Juvenal Raisin U, DO  ipratropium-albuterol  (DUONEB) 0.5-2.5 (3) MG/3ML SOLN Take 3 mLs by nebulization every 6 (six) hours as needed (SOB). 12/30/23   [provider]  lactulose (CHRONULAC) 10 GM/15ML solution Take 30 mLs (20 g total) by mouth daily. 02/06/24   Al-Sultani, Anmar, MD  loratadine  (CLARITIN ) 10 MG tablet Take 10 mg by mouth in the morning.    [provider]  metolazone (ZAROXOLYN) 2.5  MG tablet Take 1 tablet (2.5 mg total) by mouth daily as needed (edema). 02/02/24   Vann, Jessica U, DO  Nystatin (GERHARDT'S BUTT CREAM) CREA Apply 1 Application topically 2 (two) times daily. 02/02/24   Vann, Jessica U, DO  pantoprazole  (PROTONIX ) 40 MG tablet Take 1 tablet (40 mg total) by mouth 2 (two) times daily. 02/02/24   Vann, Jessica U, DO  polyethylene glycol (MIRALAX  / GLYCOLAX ) 17 g packet Take 17 g by mouth daily. 02/06/24   Al-Sultani, Anmar, MD  senna-docusate  (SENOKOT-S) 8.6-50 MG tablet Take 1 tablet by mouth 2 (two) times daily. 02/05/24   Al-Sultani, Anmar, MD  sucralfate (CARAFATE) 1 GM/10ML suspension Take 10 mLs (1 g total) by mouth 4 (four) times daily -  with meals and at bedtime. 02/02/24   Vann, Jessica U, DO  tiZANidine (ZANAFLEX) 4 MG tablet Take 1 tablet (4 mg total) by mouth as needed for muscle spasms. 02/02/24   Vann, Jessica U, DO  torsemide  60 MG TABS Take 60 mg by mouth daily. 02/02/24   Vann, Jessica U, DO    Allergies: Nsaids, Penicillins, and Quinapril  hcl    Review of Systems  Unable to perform ROS: Mental status change    Updated Vital Signs BP 104/60   Pulse (!) 115   Temp 97.8 F (36.6 C) (Rectal)   Resp 20   Ht 5' 6 (1.676 m)   Wt 86 kg   SpO2 96%   BMI 30.60 kg/m   Physical Exam Vitals and nursing note reviewed.  Constitutional:      Appearance: She is well-developed.     Comments: Patient is somnolent but easily arousable and able to answer question.  HENT:     Head: Atraumatic.  Eyes:     Conjunctiva/sclera: Conjunctivae normal.  Cardiovascular:     Rate and Rhythm: Tachycardia present. Rhythm irregular.     Pulses: Normal pulses.     Heart sounds: Normal heart sounds.  Pulmonary:     Effort: Pulmonary effort is normal.     Comments: Decreased breath sounds without any overt wheezes rales or rhonchi Abdominal:     Tenderness: There is abdominal tenderness (Abdomen is distended and tender to palpation.).  Musculoskeletal:     Cervical back: Neck supple.  Skin:    Findings: No rash.     Comments: Both feet are in an ulnar boot.  To ulcerated lesion noted to the ventral aspect of the right foot without significant erythema.  Pressure ulcer noted to the heel of left foot without surrounding erythema or drainage.  Neurological:     Comments: Drowsy but able to answer questions appropriately.  Patient is alert oriented x 3  Psychiatric:        Mood and Affect: Mood normal.     (all labs ordered  are listed, but only abnormal results are displayed) Labs Reviewed  CBC WITH DIFFERENTIAL/PLATELET - Abnormal; Notable for the following components:      Result Value   WBC 23.7 (*)    RBC 2.75 (*)    Hemoglobin 6.9 (*)    HCT 23.3 (*)    MCH 25.1 (*)    MCHC 29.6 (*)    RDW 21.4 (*)    Neutro Abs 20.6 (*)    Monocytes Absolute 2.0 (*)    Abs Immature Granulocytes 0.25 (*)    All other components within normal limits  COMPREHENSIVE METABOLIC PANEL WITH GFR - Abnormal; Notable for the following components:   Chloride  94 (*)    CO2 19 (*)    Glucose, Bld 123 (*)    BUN 105 (*)    Creatinine, Ser 2.97 (*)    Total Protein 5.1 (*)    Albumin 2.7 (*)    GFR, Estimated 16 (*)    Anion gap 22 (*)    All other components within normal limits  POC OCCULT BLOOD, ED - Abnormal; Notable for the following components:   Fecal Occult Bld POSITIVE (*)    All other components within normal limits  TROPONIN T, HIGH SENSITIVITY - Abnormal; Notable for the following components:   Troponin T High Sensitivity 142 (*)    All other components within normal limits  LIPASE, BLOOD  URINALYSIS, ROUTINE W REFLEX MICROSCOPIC  I-STAT CG4 LACTIC ACID, ED  I-STAT CG4 LACTIC ACID, ED  TYPE AND SCREEN  PREPARE RBC (CROSSMATCH)    EKG: EKG Interpretation Date/Time:  Sunday February 15 2024 13:59:39 EDT Ventricular Rate:  102 PR Interval:    QRS Duration:  92 QT Interval:  226 QTC Calculation: 287 R Axis:   30  Text Interpretation: Atrial fibrillation Paired ventricular premature complexes Possible demand vs wandering baseline, no acute MI Confirmed by Rogelia Satterfield ([id number]) on 02/15/2024 2:03:30 PM  Radiology: CT Renal Stone Study Result Date: 02/15/2024 CLINICAL DATA:  Abdominal/flank pain, stone suspected. EXAM: CT ABDOMEN AND PELVIS WITHOUT CONTRAST TECHNIQUE: Multidetector CT imaging of the abdomen and pelvis was performed following the standard protocol without IV contrast. RADIATION DOSE  REDUCTION: This exam was performed according to the departmental dose-optimization program which includes automated exposure control, adjustment of the mA and/or kV according to patient size and/or use of iterative reconstruction technique. COMPARISON:  09/27/2023. FINDINGS: Lower chest: Multi-vessel coronary artery calcifications are noted. The heart is enlarged and there is a small pericardial effusion. There are small bilateral pleural effusions with atelectasis bilaterally. Patchy consolidation is noted in the right lower lobe. Hepatobiliary: No focal liver abnormality is seen. No gallstones, gallbladder wall thickening, or biliary dilatation. Pancreas: Unremarkable. No pancreatic ductal dilatation or surrounding inflammatory changes. Spleen: Normal in size without focal abnormality. Adrenals/Urinary Tract: The adrenal glands appear stable. There is right renal atrophy. A stable exophytic lesion is noted in the lower pole the left kidney. Predominantly vascular calcifications are seen in the kidneys bilaterally, however the possibility of small renal calculus cannot be excluded. No ureteral calculus or obstructive uropathy bilaterally. A Foley catheter is present in a decompressed urinary bladder. Stomach/Bowel: The stomach is distended with ingested debris. No bowel obstruction, free air, or pneumatosis is seen. A moderate amount of retained stool is present in the colon. Appendix is not seen. Vascular/Lymphatic: Aortic atherosclerosis. No enlarged abdominal or pelvic lymph nodes. Reproductive: Uterus and bilateral adnexa are unremarkable. Other: No abdominopelvic ascites.  Moderate anasarca is noted. Musculoskeletal: Degenerative changes are present in the thoracolumbar spine. No acute osseous abnormality is seen. IMPRESSION: 1. Patchy right lower lobe consolidation, suspicious for pneumonia. 2. Small bilateral pleural effusions. 3. Vascular calcifications in the kidneys bilaterally with possible small renal  calculi. No obstructive uropathy is seen. 4. Moderate amount of retained stool in the colon. 5. Cardiomegaly with small pericardial effusion and coronary artery calcifications. 6. Aortic atherosclerosis. Electronically Signed   By: Leita Birmingham M.D.   On: 02/15/2024 14:44   CT Head Wo Contrast Result Date: 02/15/2024 CLINICAL DATA:  Altered mental status. EXAM: CT HEAD WITHOUT CONTRAST TECHNIQUE: Contiguous axial images were obtained from the base of the skull through  the vertex without intravenous contrast. RADIATION DOSE REDUCTION: This exam was performed according to the departmental dose-optimization program which includes automated exposure control, adjustment of the mA and/or kV according to patient size and/or use of iterative reconstruction technique. COMPARISON:  July 16, 2023 FINDINGS: Brain: There is generalized cerebral atrophy with widening of the extra-axial spaces and ventricular dilatation. There are areas of decreased attenuation within the white matter tracts of the supratentorial brain, consistent with microvascular disease changes. Vascular: No hyperdense vessel or unexpected calcification. Skull: Normal. Negative for fracture or focal lesion. Sinuses/Orbits: No acute finding. Other: None. IMPRESSION: 1. Generalized cerebral atrophy and microvascular disease changes of the supratentorial brain. 2. No acute intracranial abnormality. Electronically Signed   By: Suzen Dials M.D.   On: 02/15/2024 14:35   DG Chest Portable 1 View Result Date: 02/15/2024 CLINICAL DATA:  Infection. EXAM: PORTABLE CHEST 1 VIEW COMPARISON:  Shortness dated 01/24/2024 FINDINGS: Cardiomegaly with vascular congestion. Small right pleural effusion and right lung base atelectasis or scarring. Faint diffuse asymmetric right lung hazy density may represent asymmetric edema, atelectasis, or pneumonia. No pneumothorax. No acute osseous pathology. IMPRESSION: 1. Cardiomegaly with vascular congestion. 2. Small right  pleural effusion and right lung base atelectasis or scarring. 3. Faint diffuse asymmetric right lung hazy density may represent asymmetric edema, atelectasis, or pneumonia. Electronically Signed   By: Vanetta Chou M.D.   On: 02/15/2024 13:33     .Critical Care  Performed by: Nivia Colon, PA-C Authorized by: Nivia Colon, PA-C   Critical care provider statement:    Critical care time (minutes):  30   Critical care was time spent personally by me on the following activities:  Development of treatment plan with patient or surrogate, discussions with consultants, evaluation of patient's response to treatment, examination of patient, ordering and review of laboratory studies, ordering and review of radiographic studies, ordering and performing treatments and interventions, pulse oximetry, re-evaluation of patient's condition and review of old charts    Medications Ordered in the ED  0.9 %  sodium chloride  infusion (Manually program via Guardrails IV Fluids) (has no administration in time range)  cefTRIAXone  (ROCEPHIN ) 1 g in sodium chloride  0.9 % 100 mL IVPB (has no administration in time range)  azithromycin  (ZITHROMAX ) 500 mg in sodium chloride  0.9 % 250 mL IVPB (has no administration in time range)  vancomycin  (VANCOCIN ) IVPB 1000 mg/200 mL premix (has no administration in time range)  fentaNYL (SUBLIMAZE) injection 50 mcg (50 mcg Intravenous Given 02/15/24 1441)                                    Medical Decision Making Amount and/or Complexity of Data Reviewed Labs: ordered. Radiology: ordered. ECG/medicine tests: ordered.  Risk Prescription drug management.   BP 104/60   Pulse (!) 115   Temp 97.8 F (36.6 C) (Rectal)   Resp 20   Ht 5' 6 (1.676 m)   Wt 86 kg   SpO2 96%   BMI 30.60 kg/m   59:87 PM  73 year old female with history of diabetes, hypertension, COPD, GI bleed, atrial fibrillation currently on Eliquis  brought here via EMS from home with concerns of  potential sepsis.  Patient was previously hospitalized for sepsis due to cellulitis involving her lower extremities.  She was also found to be having acute on chronic anemia during her hospitalization.  She was hospitalized from 01/24/2024 and was discharged on 02/05/2024 to a  rehab facility.  This morning staff noticed that patient appears to be more confused than her baseline.  They worry about sepsis and patient brought here for further care.  Per EMS, patient was warm to the touch, hypotensive and tachycardic with EKG shows A-fib with RVR.  Patient received 100 mL of lactic ringer fluid and brought here.  Currently patient is complaining of generalized bodyaches but history otherwise limited.  On exam this is an obese female appears somnolent is not sleeping but arousable and able to answer questions.  She is complaining of generalized tenderness.  She does have tenderness about the abdomen on palpation.  Abdomen is slightly distended.  She has multiple ulcerations skin changes noted to both feet which was dressed appropriately.  I removed the dressing and I did not see any significant cellulitis or abscess.  No strong odor.  A rectal temp was obtained and she is afebrile.  -Labs ordered, independently viewed and interpreted by me.  Labs remarkable for hemoglobin is 6.9, it was 9.7 approximately 10 days ago.  Patient does have positive fecal occult blood test with normal color stool.  Type and screen ordered, will give patient 1 unit of packed red blood cell.  Normal lactic acid and white count of 23.7 and evidence of AKI with creatinine of 2.97 worse than baseline.  Patient also has an elevated troponin of 142 likely demand ischemia.  Will obtain delta troponin as patient had increased risk of bleeding with heparinization. -The patient was maintained on a cardiac monitor.  I personally viewed and interpreted the cardiac monitored which showed an underlying rhythm of: Atrial fibrillation -Imaging  independently viewed and interpreted by me and I agree with radiologist's interpretation.  Result remarkable for head CT scan unremarkable CT scan of the abdomen pelvis showing patchy right lower lobe infiltrate concerning for pneumonia with small bilateral pleural effusion.  She is currently receiving broad-spectrum antibiotic. -This patient presents to the ED for concern of altered mental status, this involves an extensive number of treatment options, and is a complaint that carries with it a high risk of complications and morbidity.  The differential diagnosis includes sepsis, pneumonia, UTI, colitis, MI, -Co morbidities that complicate the patient evaluation includes diabetes, hypertension, COPD, GI bleed -Treatment includes blood transfusion, broad-spectrum antibiotic, IV fluid -Reevaluation of the patient after these medicines showed that the patient stayed the same -PCP office notes or outside notes reviewed -Discussion with specialist [facility] Dr. Celinda who agrees to admit pt.  I have also sent a secure chat to GI specialist Dr. Saintclair requesting GI to be involve in her care as well due to GIB on DOAC -Escalation to admission/observation considered: patient will be admitted      Final diagnoses:  Symptomatic anemia  Sepsis, due to unspecified organism, unspecified whether acute organ dysfunction present [facility])  Demand ischemia [facility])  Gastrointestinal hemorrhage, unspecified gastrointestinal hemorrhage type    ED Discharge Orders     None          Nivia Colon, PA-C 02/15/24 1523    Rogelia Jerilynn RAMAN, MD 02/29/24 1704

## 2024-02-15 NOTE — H&P (Signed)
 History and Physical    Patient: Paula Wheeler FMW:[id number] DOB: 1950/08/18 DOA: 02/15/2024 DOS: the patient was seen and examined on 02/15/2024 PCP: Okey Carlin Redbird, MD  Patient coming from: SNF  Chief Complaint:  Chief Complaint  Patient presents with   Altered Mental Status   HPI: Paula Wheeler is a 73 y.o. female with medical history significant of anemia, active cigarette smoking, COPD, hyperlipidemia, exogenous obesity, GERD, GI bleed, hypertension, iron  deficiency anemia, longstanding persistent atrial fibrillation, microalbuminuria, tobacco dependence, type 2 diabetes who presented to the emergency department from [facility] due to altered mental status, only oriented to name, when she is usually fully oriented x 4.  She is unable to provide further information at this time.  She is able to answer simple questions, no headache, chest pain, but is complaining of abdominal pain.  Unable to obtain further HPI from the patient.  Per patient's son, she was at her baseline mental status yesterday.  Lab work: Urinalysis was normal.  Fecal occult blood was positive.  CBC showed a white count of 23.7 with 87% neutrophils, hemoglobin 6.9 g/dL and platelets 757.  Normal lipase and lactic acid.  Troponin was 142 ng/L.  CMP showed a 736, potassium 3.7, chloride 94 and CO2 19 mmol/L with an anion gap of 22.  Glucose 723, BUN 105 and creatinine 2.97 mg/dL.  Most recent creatinine was 2.27 mg/dL 10 days ago.  Total protein was 5.1 and albumin 2.7 g/dL, the rest of the hepatic functions and calcium  level were normal.  Imaging: Portable 1 view chest radiograph showing cardiomegaly with vascular congestion.  Small right pleural effusion and right lung base atelectasis or scaring.  Faint diffuse asymmetry of right lobe hazy density may represent asymmetric edema, atelectasis or pneumonia.  CT head without contrast showing generalized cerebral atrophy and microvascular disease changes of the  supratentorial brain, but no acute intracranial normality.  CT renal stone study showed patchy right lower lobe consolidation, suspicious for pneumonia.  Small bilateral pleural effusions.  Vascular calcifications in the kidneys bilaterally with possible small renal calculi.  No obstructive uropathy seen.  Moderate amount of retained stool in the colon.  Cardiomegaly with small pericardial effusion and coronary artery calcifications.  Aortic atherosclerosis.   ED course: 97.8 F, pulse 115, respirations 20, BP 104/60 mmHg and O2 sat 96% on room air.  The patient received fentanyl 50 mcg IVP, ceftriaxone  1 g IVPB, azithromycin  500 mg IVPB and vancomycin  1 g IVPB.  Review of Systems: As mentioned in the history of present illness. All other systems reviewed and are negative. Past Medical History:  Diagnosis Date   Anemia    COPD (chronic obstructive pulmonary disease) (HCC)    Dyslipidemia    Exogenous obesity    GERD (gastroesophageal reflux disease)    GI bleed 05/14/2016   History of abnormal cells from cervix    Hyperlipidemia    Hypertension    Iron  deficiency anemia    Longstanding persistent atrial fibrillation (HCC)    Microalbuminuria    Tobacco dependence    Type II diabetes mellitus (HCC)    Past Surgical History:  Procedure Laterality Date   CARDIOVERSION N/A 08/07/2016   Procedure: CARDIOVERSION;  Surgeon: Aleene JINNY Passe, MD;  Location: [facility] ENDOSCOPY;  Service: Cardiovascular;  Laterality: N/A;   DIAGNOSTIC MAMMOGRAM  05/2015   ESOPHAGOGASTRODUODENOSCOPY N/A 05/15/2016   Procedure: ESOPHAGOGASTRODUODENOSCOPY (EGD);  Surgeon: Lesta JULIANNA Fitz, MD;  Location: [facility] ENDOSCOPY;  Service: Endoscopy;  Laterality: N/A;  ESOPHAGOGASTRODUODENOSCOPY N/A 01/26/2024   Procedure: EGD (ESOPHAGOGASTRODUODENOSCOPY);  Surgeon: Dianna Specking, MD;  Location: [facility];  Service: Gastroenterology;  Laterality: N/A;   EXPLORATORY LAPAROTOMY  1968   I think they left my appendix   PAP SMEAR   2012 AND 2017   Social History:  reports that she has been smoking cigarettes. She has a 20 pack-year smoking history. She has never been exposed to tobacco smoke. She has never used smokeless tobacco. She reports that she does not currently use alcohol. She reports that she does not use drugs.  Allergies  Allergen Reactions   Nsaids Swelling    Hands and legs swell   Penicillins Hives    Has patient had a PCN reaction causing immediate rash, facial/tongue/throat swelling, SOB or lightheadedness with hypotension: Yes Has patient had a PCN reaction causing severe rash involving mucus membranes or skin necrosis: No Has patient had a PCN reaction that required hospitalization: No Has patient had a PCN reaction occurring within the last 10 years: No If all of the above answers are NO, then may proceed with Cephalosporin use.    Quinapril  Hcl Other (See Comments)    Other reaction(s): elevated potassium and creatinine    Family History  Problem Relation Age of Onset   Cancer Mother        BREAST   Hypertension Mother    Macular degeneration Mother    Heart attack Father    Diabetes Father    Colon polyps Brother    Colon polyps Brother    Colon polyps Brother    Colon polyps Brother    Colon polyps Brother    Colon polyps Brother     Prior to Admission medications   Medication Sig Start Date End Date Taking? Authorizing Provider  acetaminophen  (TYLENOL ) 500 MG tablet Take 500 mg by mouth every 6 (six) hours as needed for mild pain (pain score 1-3) or moderate pain (pain score 4-6).    [provider]  albuterol  (PROVENTIL  HFA;VENTOLIN  HFA) 108 (90 Base) MCG/ACT inhaler Inhale 2 puffs into the lungs every 4 (four) hours as needed for wheezing or shortness of breath.    [provider]  allopurinol  (ZYLOPRIM ) 100 MG tablet Take 100 mg by mouth in the morning.    [provider]  amLODipine  (NORVASC ) 5 MG tablet Take 5 mg by mouth every morning. 04/26/20    [provider]  apixaban  (ELIQUIS ) 5 MG TABS tablet Take 1 tablet (5 mg total) by mouth 2 (two) times daily. 08/20/23   Nahser, Aleene PARAS, MD  atorvastatin  (LIPITOR) 80 MG tablet Take 1 tablet (80 mg total) by mouth at bedtime. 08/20/23 01/25/24  Nahser, Aleene PARAS, MD  diclofenac  Sodium (VOLTAREN ) 1 % GEL Apply 2 g topically 4 (four) times daily. 02/02/24   Vann, Jessica U, DO  Fluticasone -Umeclidin-Vilant (TRELEGY ELLIPTA ) 100-62.5-25 MCG/ACT AEPB INHALE 1 PUFF INTO THE LUNGS DAILY 12/12/23   Kara Dorn NOVAK, MD  gabapentin (NEURONTIN) 100 MG capsule Take 1 capsule (100 mg total) by mouth 2 (two) times daily. 02/02/24   Vann, Jessica U, DO  HYDROcodone -acetaminophen  (NORCO/VICODIN) 5-325 MG tablet Take 1-2 tablets by mouth every 4 (four) hours as needed for moderate pain (pain score 4-6) or severe pain (pain score 7-10). 02/02/24   Vann, Jessica U, DO  insulin  NPH-regular Human (70-30) 100 UNIT/ML injection Inject 30 Units into the skin 2 (two) times daily with a meal. Inject 30 units into the skin in the morning and 60 units  into the skin in the evening 02/02/24   Vann, Jessica U, DO  ipratropium-albuterol  (DUONEB) 0.5-2.5 (3) MG/3ML SOLN Take 3 mLs by nebulization every 6 (six) hours as needed (SOB). 12/30/23   [provider]  lactulose (CHRONULAC) 10 GM/15ML solution Take 30 mLs (20 g total) by mouth daily. 02/06/24   Al-Sultani, Anmar, MD  loratadine  (CLARITIN ) 10 MG tablet Take 10 mg by mouth in the morning.    [provider]  metolazone (ZAROXOLYN) 2.5 MG tablet Take 1 tablet (2.5 mg total) by mouth daily as needed (edema). 02/02/24   Vann, Jessica U, DO  Nystatin (GERHARDT'S BUTT CREAM) CREA Apply 1 Application topically 2 (two) times daily. 02/02/24   Vann, Jessica U, DO  pantoprazole  (PROTONIX ) 40 MG tablet Take 1 tablet (40 mg total) by mouth 2 (two) times daily. 02/02/24   Vann, Jessica U, DO  polyethylene glycol (MIRALAX  / GLYCOLAX ) 17 g packet Take 17 g by mouth daily.  02/06/24   Al-Sultani, Anmar, MD  senna-docusate (SENOKOT-S) 8.6-50 MG tablet Take 1 tablet by mouth 2 (two) times daily. 02/05/24   Al-Sultani, Anmar, MD  sucralfate (CARAFATE) 1 GM/10ML suspension Take 10 mLs (1 g total) by mouth 4 (four) times daily -  with meals and at bedtime. 02/02/24   Vann, Jessica U, DO  tiZANidine (ZANAFLEX) 4 MG tablet Take 1 tablet (4 mg total) by mouth as needed for muscle spasms. 02/02/24   Vann, Jessica U, DO  torsemide  60 MG TABS Take 60 mg by mouth daily. 02/02/24   Juvenal Harlene PENNER, DO    Physical Exam: Vitals:   02/15/24 1240 02/15/24 1242  BP:  104/60  Pulse:  (!) 115  Resp:  20  Temp:  97.8 F (36.6 C)  TempSrc:  Rectal  SpO2:  96%  Weight: 86 kg   Height: 5' 6 (1.676 m)    Physical Exam Vitals reviewed.  Constitutional:      General: She is awake. She is not in acute distress.    Appearance: She is ill-appearing.  HENT:     Head: Normocephalic.     Nose: No rhinorrhea.     Mouth/Throat:     Mouth: Mucous membranes are moist.  Eyes:     General: No scleral icterus.    Pupils: Pupils are equal, round, and reactive to light.  Neck:     Vascular: No JVD.  Cardiovascular:     Rate and Rhythm: Tachycardia present. Rhythm irregular.     Heart sounds: S1 normal and S2 normal.  Pulmonary:     Effort: Pulmonary effort is normal.     Breath sounds: Normal breath sounds. No wheezing, rhonchi or rales.  Abdominal:     General: Bowel sounds are normal. There is no distension.     Palpations: Abdomen is soft.     Tenderness: There is abdominal tenderness. There is no guarding or rebound.  Musculoskeletal:     Cervical back: Neck supple.     Right lower leg: No edema.     Left lower leg: No edema.  Skin:    General: Skin is warm and dry.  Neurological:     General: No focal deficit present.     Mental Status: She is alert. She is disoriented.  Psychiatric:        Attention and Perception: She is inattentive.        Mood and Affect: Mood is  anxious.        Behavior: Behavior is withdrawn.  Cognition and Memory: She exhibits impaired recent memory and impaired remote memory.    Data Reviewed:  Results are pending, will review when available. 07/18/2023 echocardiogram report. IMPRESSIONS:   1. Left ventricular ejection fraction, by estimation, is 60 to 65%. Left  ventricular ejection fraction by PLAX is 61 %. The left ventricle has  normal function. The left ventricle has no regional wall motion  abnormalities. Left ventricular diastolic  parameters are indeterminate. Elevated left ventricular end-diastolic  pressure.   2. Right ventricular systolic function is low normal. The right  ventricular size is normal. There is normal pulmonary artery systolic  pressure. The estimated right ventricular systolic pressure is 28.5 mmHg.   3. Left atrial size was moderately dilated.   4. The mitral valve is grossly normal. Mild mitral valve regurgitation.   5. The aortic valve is tricuspid. Aortic valve regurgitation is not  visualized.   6. Aortic dilatation noted. There is mild dilatation of the ascending  aorta, measuring 41 mm.   7. The inferior vena cava is dilated in size with <50% respiratory  variability, suggesting right atrial pressure of 15 mmHg.   Comparison(s): No significant change from prior study. 08/10/2021: LVEF  60-65%.   EKG: Vent. rate 111 BPM PR interval * ms QRS duration 85 ms QT/QTcB 453/616 ms P-R-T axes * 27 120 Atrial fibrillation Inferoposterior infarct, acute (LCx) Prolonged QT interval   Assessment and Plan: Principal Problem:   Acute on chronic blood loss anemia In the setting of:   GI bleed Admit to stepdown/inpatient. Clear liquid diet. Keep NPO after midnight. Hold DOAC. Continue pantoprazole  40 mg IVP twice daily. Transfuse 1 unit of PRBC. Monitor H&H closely. Transfuse further as needed. [facility] consult requested by ED.  Active Problems:   COPD with acute  exacerbation (HCC) Supplemental oxygen . Xopenex  1.25 mg neb every 8 hours. Will defer glucocorticoids to be given diabetes. Continue ceftriaxone  1 g IVPB daily. Continue azithromycin  500 mg IVPB daily. Check strep pneumoniae urinary antigen. Check procalcitonin level. Follow-up blood culture and sensitivity. Follow-up CBC and chemistry in the morning.    Elevated troponin Likely demand ischemia. Will follow the second measurement.     Persistent atrial fibrillation CHA?DS?-VASc Score of 7. Hold apixaban .    Chronic diastolic CHF (congestive heart failure) (HCC) Holding torsemide  due to low blood pressures.    Prolonged QT interval Avoid QT prolonging meds as possible. KCl supplementation as needed. Check magnesium  level. Keep electrolytes optimized. Check EKG in the morning.    Essential hypertension BP has been soft. Tachycardic as well. Hold amlodipine  for now.    HLD (hyperlipidemia) Continue atorvastatin  80 mg p.o. daily.    Acute renal failure superimposed on stage 4 chronic kidney disease (HCC) Likely due to hypovolemia. She is currently getting transfused. Will hold diuretics for now. Monitor intake and output. Avoid hypotension and nephrotoxins.    GERD (gastroesophageal reflux disease) On parenteral PPI.    Controlled type 2 diabetes mellitus without complication (HCC) Carbohydrate modified diet. CBG monitoring with RI SS. Check hemoglobin A1c.     Moderate protein malnutrition (HCC) In the setting of anemia and chronic illness. May benefit from protein supplementation. Consider nutritional services evaluation. Follow-up albumin level with morning labs.     Carotid artery disease (HCC)   PAD (peripheral artery disease) (HCC) Continue statin and hold AC. Follow-up with vascular as an outpatient. Smoking cessation advised.     GERD (gastroesophageal reflux disease) Antiacid, H2 blocker or PPI as needed.  Tobacco abuse Nicotine  replacement  therapy may be ordered as needed.       Advance Care Planning:   Code Status: Full Code   Consults: [facility].  Family Communication:   Severity of Illness: The appropriate patient status for this patient is INPATIENT. Inpatient status is judged to be reasonable and necessary in order to provide the required intensity of service to ensure the patient's safety. The patient's presenting symptoms, physical exam findings, and initial radiographic and laboratory data in the context of their chronic comorbidities is felt to place them at high risk for further clinical deterioration. Furthermore, it is not anticipated that the patient will be medically stable for discharge from the hospital within 2 midnights of admission.   * I certify that at the point of admission it is my clinical judgment that the patient will require inpatient hospital care spanning beyond 2 midnights from the point of admission due to high intensity of service, high risk for further deterioration and high frequency of surveillance required.*  Author: Alm Dorn Castor, MD 02/15/2024 3:15 PM  For on call review [email or web address].   This document was prepared using Dragon voice recognition software and may contain some unintended transcription errors.

## 2024-02-16 ENCOUNTER — Inpatient Hospital Stay (HOSPITAL_COMMUNITY)

## 2024-02-16 DIAGNOSIS — E119 Type 2 diabetes mellitus without complications: Secondary | ICD-10-CM

## 2024-02-16 DIAGNOSIS — A419 Sepsis, unspecified organism: Secondary | ICD-10-CM | POA: Diagnosis not present

## 2024-02-16 DIAGNOSIS — I2489 Other forms of acute ischemic heart disease: Secondary | ICD-10-CM

## 2024-02-16 DIAGNOSIS — I4891 Unspecified atrial fibrillation: Secondary | ICD-10-CM | POA: Diagnosis not present

## 2024-02-16 DIAGNOSIS — I5033 Acute on chronic diastolic (congestive) heart failure: Secondary | ICD-10-CM

## 2024-02-16 DIAGNOSIS — F17210 Nicotine dependence, cigarettes, uncomplicated: Secondary | ICD-10-CM

## 2024-02-16 DIAGNOSIS — R579 Shock, unspecified: Secondary | ICD-10-CM | POA: Diagnosis not present

## 2024-02-16 DIAGNOSIS — J449 Chronic obstructive pulmonary disease, unspecified: Secondary | ICD-10-CM

## 2024-02-16 DIAGNOSIS — N189 Chronic kidney disease, unspecified: Secondary | ICD-10-CM

## 2024-02-16 DIAGNOSIS — I5032 Chronic diastolic (congestive) heart failure: Secondary | ICD-10-CM

## 2024-02-16 DIAGNOSIS — K5900 Constipation, unspecified: Secondary | ICD-10-CM

## 2024-02-16 DIAGNOSIS — N179 Acute kidney failure, unspecified: Secondary | ICD-10-CM | POA: Diagnosis not present

## 2024-02-16 DIAGNOSIS — K922 Gastrointestinal hemorrhage, unspecified: Secondary | ICD-10-CM | POA: Diagnosis not present

## 2024-02-16 DIAGNOSIS — D649 Anemia, unspecified: Secondary | ICD-10-CM

## 2024-02-16 LAB — CBC
HCT: 23.5 % — ABNORMAL LOW (ref 36.0–46.0)
HCT: 22.5 % — ABNORMAL LOW (ref 36.0–46.0)
Hemoglobin: 7.3 g/dL — ABNORMAL LOW (ref 12.0–15.0)
Hemoglobin: 7 g/dL — ABNORMAL LOW (ref 12.0–15.0)
MCH: 27.3 pg (ref 26.0–34.0)
MCH: 27 pg (ref 26.0–34.0)
MCHC: 31.1 g/dL (ref 30.0–36.0)
MCHC: 31.1 g/dL (ref 30.0–36.0)
MCV: 88 fL (ref 80.0–100.0)
MCV: 86.9 fL (ref 80.0–100.0)
Platelets: 237 K/uL (ref 150–400)
Platelets: 198 K/uL (ref 150–400)
RBC: 2.67 MIL/uL — ABNORMAL LOW (ref 3.87–5.11)
RBC: 2.59 MIL/uL — ABNORMAL LOW (ref 3.87–5.11)
RDW: 20 % — ABNORMAL HIGH (ref 11.5–15.5)
RDW: 19 % — ABNORMAL HIGH (ref 11.5–15.5)
WBC: 27.8 K/uL — ABNORMAL HIGH (ref 4.0–10.5)
WBC: 28.5 K/uL — ABNORMAL HIGH (ref 4.0–10.5)
nRBC: 0.1 % (ref 0.0–0.2)
nRBC: 0.1 % (ref 0.0–0.2)

## 2024-02-16 LAB — HEMOGLOBIN AND HEMATOCRIT, BLOOD
HCT: 23.8 % — ABNORMAL LOW (ref 36.0–46.0)
Hemoglobin: 7.4 g/dL — ABNORMAL LOW (ref 12.0–15.0)

## 2024-02-16 LAB — PROCALCITONIN: Procalcitonin: 1.03 ng/mL

## 2024-02-16 LAB — BETA-HYDROXYBUTYRIC ACID: Beta-Hydroxybutyric Acid: 6.8 mmol/L — ABNORMAL HIGH (ref 0.05–0.27)

## 2024-02-16 LAB — COMPREHENSIVE METABOLIC PANEL WITH GFR
ALT: 14 U/L (ref 0–44)
AST: 30 U/L (ref 15–41)
Albumin: 2.9 g/dL — ABNORMAL LOW (ref 3.5–5.0)
Alkaline Phosphatase: 124 U/L (ref 38–126)
Anion gap: 31 — ABNORMAL HIGH (ref 5–15)
BUN: 118 mg/dL — ABNORMAL HIGH (ref 8–23)
CO2: 13 mmol/L — ABNORMAL LOW (ref 22–32)
Calcium: 9.5 mg/dL (ref 8.9–10.3)
Chloride: 92 mmol/L — ABNORMAL LOW (ref 98–111)
Creatinine, Ser: 3.58 mg/dL — ABNORMAL HIGH (ref 0.44–1.00)
GFR, Estimated: 13 mL/min — ABNORMAL LOW (ref >60)
Glucose, Bld: 209 mg/dL — ABNORMAL HIGH (ref 70–99)
Potassium: 4.1 mmol/L (ref 3.5–5.1)
Sodium: 136 mmol/L (ref 135–145)
Total Bilirubin: 0.9 mg/dL (ref 0.0–1.2)
Total Protein: 5.6 g/dL — ABNORMAL LOW (ref 6.5–8.1)

## 2024-02-16 LAB — ECHOCARDIOGRAM LIMITED
AR max vel: 2.56 cm2
AV Area VTI: 2.27 cm2
AV Area mean vel: 2.21 cm2
AV Mean grad: 6.5 mmHg
AV Peak grad: 12.3 mmHg
Ao pk vel: 1.75 m/s
Area-P 1/2: 3.65 cm2
Calc EF: 65.7 %
Height: 66 in
S' Lateral: 3.4 cm
Single Plane A2C EF: 58.1 %
Single Plane A4C EF: 70.4 %
Weight: 3033.53 [oz_av]

## 2024-02-16 LAB — PREPARE RBC (CROSSMATCH)

## 2024-02-16 LAB — GLUCOSE, CAPILLARY
Glucose-Capillary: 215 mg/dL — ABNORMAL HIGH (ref 70–99)
Glucose-Capillary: 209 mg/dL — ABNORMAL HIGH (ref 70–99)
Glucose-Capillary: 221 mg/dL — ABNORMAL HIGH (ref 70–99)
Glucose-Capillary: 223 mg/dL — ABNORMAL HIGH (ref 70–99)

## 2024-02-16 LAB — DIC (DISSEMINATED INTRAVASCULAR COAGULATION)PANEL
D-Dimer, Quant: 0.44 ug{FEU}/mL (ref 0.00–0.50)
Fibrinogen: 544 mg/dL — ABNORMAL HIGH (ref 210–475)
INR: 2.4 — ABNORMAL HIGH (ref 0.8–1.2)
Platelets: 197 K/uL (ref 150–400)
Prothrombin Time: 27.4 s — ABNORMAL HIGH (ref 11.4–15.2)
aPTT: 72 s — ABNORMAL HIGH (ref 24–36)

## 2024-02-16 LAB — MRSA NEXT GEN BY PCR, NASAL: MRSA by PCR Next Gen: NOT DETECTED

## 2024-02-16 LAB — LACTIC ACID, PLASMA: Lactic Acid, Venous: 1.1 mmol/L (ref 0.5–1.9)

## 2024-02-16 MED ORDER — METRONIDAZOLE 500 MG/100ML IV SOLN
500.0000 mg | Freq: Two times a day (BID) | INTRAVENOUS | Status: DC
  Administered 2024-02-16 – 2024-02-19 (×7): 500 mg via INTRAVENOUS
  Filled 2024-02-16 (×7): qty 100

## 2024-02-16 MED ORDER — STERILE WATER FOR INJECTION IV SOLN
INTRAVENOUS | Status: DC
  Filled 2024-02-16: qty 150

## 2024-02-16 MED ORDER — AMIODARONE HCL IN DEXTROSE 360-4.14 MG/200ML-% IV SOLN
60.0000 mg/h | INTRAVENOUS | Status: AC
  Administered 2024-02-16: 60 mg/h via INTRAVENOUS

## 2024-02-16 MED ORDER — BISACODYL 10 MG RE SUPP
10.0000 mg | Freq: Once | RECTAL | Status: AC
  Administered 2024-02-16: 10 mg via RECTAL
  Filled 2024-02-16: qty 1

## 2024-02-16 MED ORDER — AMIODARONE HCL IN DEXTROSE 360-4.14 MG/200ML-% IV SOLN
INTRAVENOUS | Status: AC
  Administered 2024-02-16: 60 mg/h via INTRAVENOUS
  Filled 2024-02-16: qty 200

## 2024-02-16 MED ORDER — INFLUENZA VAC SPLIT HIGH-DOSE 0.5 ML IM SUSY
0.5000 mL | PREFILLED_SYRINGE | INTRAMUSCULAR | Status: DC
  Filled 2024-02-16: qty 0.5

## 2024-02-16 MED ORDER — AMIODARONE IV BOLUS ONLY 150 MG/100ML
INTRAVENOUS | Status: AC
  Administered 2024-02-16: 150 mg
  Filled 2024-02-16: qty 100

## 2024-02-16 MED ORDER — ALBUMIN HUMAN 25 % IV SOLN
25.0000 g | Freq: Once | INTRAVENOUS | Status: AC
  Administered 2024-02-16: 12.5 g via INTRAVENOUS
  Filled 2024-02-16: qty 100

## 2024-02-16 MED ORDER — NOREPINEPHRINE 4 MG/250ML-% IV SOLN
INTRAVENOUS | Status: AC
  Administered 2024-02-16: 2 ug/min via INTRAVENOUS
  Filled 2024-02-16: qty 250

## 2024-02-16 MED ORDER — SODIUM CHLORIDE 0.9% IV SOLUTION: Freq: Once | INTRAVENOUS | Status: AC

## 2024-02-16 MED ORDER — NOREPINEPHRINE 4 MG/250ML-% IV SOLN
0.0000 ug/min | INTRAVENOUS | Status: DC
  Administered 2024-02-18: 2 ug/min via INTRAVENOUS

## 2024-02-16 MED ORDER — LEVALBUTEROL HCL 1.25 MG/0.5ML IN NEBU
1.2500 mg | INHALATION_SOLUTION | Freq: Four times a day (QID) | RESPIRATORY_TRACT | Status: DC
  Administered 2024-02-16: 1.25 mg via RESPIRATORY_TRACT
  Filled 2024-02-16: qty 0.5

## 2024-02-16 MED ORDER — AMIODARONE HCL IN DEXTROSE 360-4.14 MG/200ML-% IV SOLN: 60.0000 mg/h | INTRAVENOUS | Status: DC

## 2024-02-16 MED ORDER — SODIUM CHLORIDE 0.9 % IV SOLN: 250.0000 mL | INTRAVENOUS | Status: AC

## 2024-02-16 MED ORDER — HYDROMORPHONE HCL 1 MG/ML IJ SOLN
0.5000 mg | INTRAMUSCULAR | Status: DC | PRN
  Administered 2024-02-16 – 2024-02-19 (×10): 1 mg via INTRAVENOUS
  Filled 2024-02-16 (×11): qty 1

## 2024-02-16 MED ORDER — IOHEXOL 300 MG/ML  SOLN
80.0000 mL | Freq: Once | INTRAMUSCULAR | Status: AC | PRN
  Administered 2024-02-16: 80 mL via INTRAVENOUS

## 2024-02-16 MED ORDER — AMIODARONE LOAD VIA INFUSION: 150.0000 mg | Freq: Once | INTRAVENOUS | Status: DC

## 2024-02-16 MED ORDER — AMIODARONE HCL IN DEXTROSE 360-4.14 MG/200ML-% IV SOLN
30.0000 mg/h | INTRAVENOUS | Status: DC
  Administered 2024-02-16 – 2024-02-19 (×5): 30 mg/h via INTRAVENOUS
  Filled 2024-02-16 (×5): qty 200

## 2024-02-16 MED ORDER — AMIODARONE HCL IN DEXTROSE 360-4.14 MG/200ML-% IV SOLN: 30.0000 mg/h | INTRAVENOUS | Status: DC

## 2024-02-16 MED ORDER — COLLAGENASE 250 UNIT/GM EX OINT
TOPICAL_OINTMENT | Freq: Every day | CUTANEOUS | Status: DC
  Filled 2024-02-16: qty 30

## 2024-02-16 MED ORDER — LEVALBUTEROL HCL 1.25 MG/0.5ML IN NEBU: 1.2500 mg | INHALATION_SOLUTION | Freq: Four times a day (QID) | RESPIRATORY_TRACT | Status: DC | PRN

## 2024-02-16 MED ORDER — SODIUM CHLORIDE 0.9 % IV SOLN
2.0000 g | INTRAVENOUS | Status: DC
  Administered 2024-02-16 – 2024-02-18 (×3): 2 g via INTRAVENOUS
  Filled 2024-02-16 (×3): qty 12.5

## 2024-02-16 MED ORDER — ALBUMIN HUMAN 25 % IV SOLN
12.5000 g | Freq: Once | INTRAVENOUS | Status: AC
  Administered 2024-02-16: 12.5 g via INTRAVENOUS
  Filled 2024-02-16: qty 50

## 2024-02-16 NOTE — Progress Notes (Signed)
 PROGRESS NOTE    Paula Wheeler  FMW:[id number] DOB: 1950-09-23 DOA: 02/15/2024 PCP: Okey Carlin Redbird, MD   Brief Narrative:  73 year old female with history of COPD, hyperlipidemia, hypertension, iron  deficiency anemia, GERD, persistent atrial fibrillation, ongoing tobacco use, diabetes mellitus type 2, GI bleed with recent admission from 01/24/2024-02/05/2024 for cellulitis and acute on chronic anemia requiring a total of 4 units packed red cells transfusion and EGD on 01/06/2024 showing LA grade C reflux esophagitis with gastritis.  She presented with altered mental status.  On presentation, UA was normal, fecal occult blood test was positive.  WBC of 23.7, hemoglobin of 6.9 with normal lactic acid.  Troponin of 142; creatinine of 2.97 (most recent creatinine was 2.27 10 days prior to presentation).  Portable chest x-ray showed cardiomegaly with vascular congestion and small right pleural effusion.  CT head without contrast showed no acute intracranial abnormality.  CT renal stone study showed patchy right lower lobe consolidation, suspicious for pneumonia with small bilateral pleural effusions with possible small renal calculi but no obstructive uropathy seen and moderate amount of retained stool in the colon.  She was started on IV antibiotics.  GI was consulted by ED provider.  Assessment & Plan:   Acute on chronic blood loss anemia Possible upper GI bleeding in a patient with recent hospitalization for acute on chronic anemia with EGD on 01/06/2024 showing LA grade C reflux esophagitis with gastritis - Presented with hemoglobin of 6.9; most recent hemoglobin on 02/05/2024 was 9.7.  Fecal occult blood test was positive.   - Status post 1 unit packed red cell transfusion continue IV Protonix .  Monitor H&H.  Hemoglobin 7.3 this morning.  Transfuse 1 more unit of packed red cells.  Keep NPO.  Awaiting GI evaluation pending recommendations.  DOAC on hold.  AKI on CKD stage IV Acute metabolic  acidosis -Baseline creatinine of 2.2-2.7.  Presented with creatinine of 2.97.  Creatinine worsening to 3.58.  Bicarb 13 today.  Start bicarb drip.  Monitor.  CT renal stone study as above.  Acute metabolic encephalopathy - Possibly from above and pneumonia and respiratory failure.  Imaging as above.  Monitor mental status.  Fall precautions.  PT eval once more stable  Possible right lower lobe bacterial pneumonia: Bacteria unidentified - Continue Rocephin  and Zithromax .  Currently requiring 3 to 4 L oxygen  via nasal cannula.  Wean off as able.  Procalcitonin 1.03.  Leukocytosis - Worsening.  Monitor  COPD Acute respiratory failure with hypoxia - Currently requiring 3 to 4 L oxygen  by nasal cannula.  Continue nebs.  Positive troponins: Possibly from demand ischemia Chronic diastolic CHF - Echo on 07/18/2023 had shown EF of 60 to 65%.  Torsemide  on hold.  Strict input and output.  Daily weights.  Fluid restriction.  No chest pain reported.  Consult cardiology  Persistent A-fib - Eliquis  on hold.  Currently tachycardic.  Follow cardiology recommendations  Prolonged QT -Telemetry monitoring.  Avoid QT prolonging medications.  Replete electrolytes as needed  Diabetes mellitus type 2 with hyperglycemia - Continue CBGs with SSI  Hypertension Hyperlipidemia - Blood pressure on the lower side.  Monitor.  Antihypertensive on hold.  Will resume Lipitor on discharge  Right ischium stage III pressure injury: Present on admission Stage III pressure injury of the sacrum down to the buttocks: Present on admission Bilateral heels unstageable pressure injuries: Present on admission Right medial foot/base of great toe deep tissue injury: Present on admission - Follow wound care consult recommendations  Possible moderate  protein calorie malnutrition -Consult nutrition once patient resumes diet  CAD PAD -Resume statin once patient is put on a diet.  Anticoagulation on hold.  Outpatient  follow-up with vascular surgery  Tobacco abuse -Counseled regarding tobacco cessation by admitting hospitalist     DVT prophylaxis: Eliquis  on hold Code Status: Full Family Communication: None at bedside Disposition Plan: Status is: Inpatient Remains inpatient appropriate because: Of severity of illness    Consultants: [facility] called by ED provider  Procedures: None  Antimicrobials: Rocephin  and Zithromax  from 02/15/2024 onwards   Subjective: Patient seen and examined at bedside.  Poor historian.  No fever, vomiting, agitation  Objective: Vitals:   02/16/24 0400 02/16/24 0500 02/16/24 0600 02/16/24 0721  BP: (!) 92/52 (!) 90/37 (!) 93/43   Pulse: (!) 129 (!) 122 (!) 127   Resp: (!) 26 19 20    Temp:    98.3 F (36.8 C)  TempSrc:    Axillary  SpO2: 98% 98% 99%   Weight:      Height:        Intake/Output Summary (Last 24 hours) at 02/16/2024 0729 Last data filed at 02/16/2024 0643 Gross per 24 hour  Intake 653.02 ml  Output 700 ml  Net -46.98 ml   Filed Weights   02/15/24 1240  Weight: 86 kg    Examination:  General exam: Appears calm and comfortable.  Looks chronically ill and deconditioned. Respiratory system: Bilateral decreased breath sounds at bases with scattered crackles and tachypnea Cardiovascular system: S1 & S2 heard, tachycardic Gastrointestinal system: Abdomen is obese, nondistended, soft and nontender. Normal bowel sounds heard. Extremities: No cyanosis, clubbing; bilateral lower extremity edema present Central nervous system: Slow to respond, poor historian.  Moaning intermittently.  No focal neurological deficits. Moving extremities Genitourinary: No CVA tenderness psychiatry: Flat affect.  Not agitated.   Data Reviewed: I have personally reviewed following labs and imaging studies  CBC: Recent Labs  Lab 02/15/24 1313 02/15/24 2221 02/16/24 0257  WBC 23.7*  --  27.8*  NEUTROABS 20.6*  --   --   HGB 6.9* 7.4* 7.3*  HCT 23.3*  24.5* 23.5*  MCV 84.7  --  88.0  PLT 242  --  237   Basic Metabolic Panel: Recent Labs  Lab 02/15/24 1313 02/15/24 2221 02/16/24 0257  NA 136  --  136  K 3.7  --  4.1  CL 94*  --  92*  CO2 19*  --  13*  GLUCOSE 123*  --  209*  BUN 105*  --  118*  CREATININE 2.97*  --  3.58*  CALCIUM  9.0  --  9.5  MG  --  2.8*  --   PHOS  --  6.1*  --    GFR: Estimated Creatinine Clearance: 15.5 mL/min (A) (by C-G formula based on SCr of 3.58 mg/dL (H)). Liver Function Tests: Recent Labs  Lab 02/15/24 1313 02/16/24 0257  AST 33 30  ALT 15 14  ALKPHOS 126 124  BILITOT 0.8 0.9  PROT 5.1* 5.6*  ALBUMIN 2.7* 2.9*   Recent Labs  Lab 02/15/24 1313  LIPASE 13   No results for input(s): AMMONIA in the last 168 hours. Coagulation Profile: No results for input(s): INR, PROTIME in the last 168 hours. Cardiac Enzymes: No results for input(s): CKTOTAL, CKMB, CKMBINDEX, TROPONINI in the last 168 hours. BNP (last 3 results) Recent Labs    01/25/24 1649 02/15/24 2221  PROBNP 3,354.0* 4,432.0*   HbA1C: No results for input(s): HGBA1C in the  last 72 hours. CBG: Recent Labs  Lab 02/15/24 2140 02/16/24 0722  GLUCAP 184* 215*   Lipid Profile: No results for input(s): CHOL, HDL, LDLCALC, TRIG, CHOLHDL, LDLDIRECT in the last 72 hours. Thyroid Function Tests: No results for input(s): TSH, T4TOTAL, FREET4, T3FREE, THYROIDAB in the last 72 hours. Anemia Panel: No results for input(s): VITAMINB12, FOLATE, FERRITIN, TIBC, IRON , RETICCTPCT in the last 72 hours. Sepsis Labs: Recent Labs  Lab 02/15/24 1313 02/15/24 1452 02/16/24 0257  PROCALCITON 0.76  --  1.03  LATICACIDVEN  --  0.8  --     No results found for this or any previous visit (from the past 240 hours).       Radiology Studies: CT Renal Stone Study Result Date: 02/15/2024 CLINICAL DATA:  Abdominal/flank pain, stone suspected. EXAM: CT ABDOMEN AND PELVIS WITHOUT  CONTRAST TECHNIQUE: Multidetector CT imaging of the abdomen and pelvis was performed following the standard protocol without IV contrast. RADIATION DOSE REDUCTION: This exam was performed according to the departmental dose-optimization program which includes automated exposure control, adjustment of the mA and/or kV according to patient size and/or use of iterative reconstruction technique. COMPARISON:  09/27/2023. FINDINGS: Lower chest: Multi-vessel coronary artery calcifications are noted. The heart is enlarged and there is a small pericardial effusion. There are small bilateral pleural effusions with atelectasis bilaterally. Patchy consolidation is noted in the right lower lobe. Hepatobiliary: No focal liver abnormality is seen. No gallstones, gallbladder wall thickening, or biliary dilatation. Pancreas: Unremarkable. No pancreatic ductal dilatation or surrounding inflammatory changes. Spleen: Normal in size without focal abnormality. Adrenals/Urinary Tract: The adrenal glands appear stable. There is right renal atrophy. A stable exophytic lesion is noted in the lower pole the left kidney. Predominantly vascular calcifications are seen in the kidneys bilaterally, however the possibility of small renal calculus cannot be excluded. No ureteral calculus or obstructive uropathy bilaterally. A Foley catheter is present in a decompressed urinary bladder. Stomach/Bowel: The stomach is distended with ingested debris. No bowel obstruction, free air, or pneumatosis is seen. A moderate amount of retained stool is present in the colon. Appendix is not seen. Vascular/Lymphatic: Aortic atherosclerosis. No enlarged abdominal or pelvic lymph nodes. Reproductive: Uterus and bilateral adnexa are unremarkable. Other: No abdominopelvic ascites.  Moderate anasarca is noted. Musculoskeletal: Degenerative changes are present in the thoracolumbar spine. No acute osseous abnormality is seen. IMPRESSION: 1. Patchy right lower lobe  consolidation, suspicious for pneumonia. 2. Small bilateral pleural effusions. 3. Vascular calcifications in the kidneys bilaterally with possible small renal calculi. No obstructive uropathy is seen. 4. Moderate amount of retained stool in the colon. 5. Cardiomegaly with small pericardial effusion and coronary artery calcifications. 6. Aortic atherosclerosis. Electronically Signed   By: Leita Birmingham M.D.   On: 02/15/2024 14:44   CT Head Wo Contrast Result Date: 02/15/2024 CLINICAL DATA:  Altered mental status. EXAM: CT HEAD WITHOUT CONTRAST TECHNIQUE: Contiguous axial images were obtained from the base of the skull through the vertex without intravenous contrast. RADIATION DOSE REDUCTION: This exam was performed according to the departmental dose-optimization program which includes automated exposure control, adjustment of the mA and/or kV according to patient size and/or use of iterative reconstruction technique. COMPARISON:  July 16, 2023 FINDINGS: Brain: There is generalized cerebral atrophy with widening of the extra-axial spaces and ventricular dilatation. There are areas of decreased attenuation within the white matter tracts of the supratentorial brain, consistent with microvascular disease changes. Vascular: No hyperdense vessel or unexpected calcification. Skull: Normal. Negative for fracture or focal lesion.  Sinuses/Orbits: No acute finding. Other: None. IMPRESSION: 1. Generalized cerebral atrophy and microvascular disease changes of the supratentorial brain. 2. No acute intracranial abnormality. Electronically Signed   By: Suzen Dials M.D.   On: 02/15/2024 14:35   DG Chest Portable 1 View Result Date: 02/15/2024 CLINICAL DATA:  Infection. EXAM: PORTABLE CHEST 1 VIEW COMPARISON:  Shortness dated 01/24/2024 FINDINGS: Cardiomegaly with vascular congestion. Small right pleural effusion and right lung base atelectasis or scarring. Faint diffuse asymmetric right lung hazy density may represent  asymmetric edema, atelectasis, or pneumonia. No pneumothorax. No acute osseous pathology. IMPRESSION: 1. Cardiomegaly with vascular congestion. 2. Small right pleural effusion and right lung base atelectasis or scarring. 3. Faint diffuse asymmetric right lung hazy density may represent asymmetric edema, atelectasis, or pneumonia. Electronically Signed   By: Vanetta Chou M.D.   On: 02/15/2024 13:33        Scheduled Meds:  Chlorhexidine  Gluconate Cloth  6 each Topical Daily   collagenase   Topical Daily   insulin  aspart  0-9 Units Subcutaneous TID WC   levalbuterol   1.25 mg Nebulization Q6H   mouth rinse  15 mL Mouth Rinse 4 times per day   pantoprazole  (PROTONIX ) IV  40 mg Intravenous Q12H   Continuous Infusions:  azithromycin      azithromycin      cefTRIAXone  (ROCEPHIN )  IV     vancomycin             Sophie Mao, MD [facility] 02/16/2024, 7:29 AM

## 2024-02-16 NOTE — Progress Notes (Signed)
 Initial Nutrition Assessment  DOCUMENTATION CODES:   Obesity unspecified  INTERVENTION:  - Will monitor for diet advancement.  - Recommend Magic cup TID with meals once diet advanced. Each supplement provides 290 kcal and 9 grams of protein - Recommend Multivitamin with minerals daily, 500mg  vitamin C BID, and 220mg  zinc x14 days to support wound healing once diet advanced.   NUTRITION DIAGNOSIS:   Inadequate oral intake related to inability to eat as evidenced by NPO status.  GOAL:   Patient will meet greater than or equal to 90% of their needs  MONITOR:   Diet advancement, Weight trends, Skin  REASON FOR ASSESSMENT:   Consult Assessment of nutrition requirement/status  ASSESSMENT:   73 y.o. female with PMH significant of anemia, COPD, HLD, GERD, GI bleed, HTN, iron  deficiency anemia, longstanding persistent atrial fibrillation, DM2 who presented from [facility] due to altered mental status. Admitted for GI bleed and COPD exacerbation.   Patient in bed at time of visit. Per discussion with RN at bedside, patient remains confused at this time and often calling out in pain.    Brother at bedside provided all nutrition history. He is unsure of a UBW but reports she has lost weight over the past 1 month due to not eating hardly anything.  Per EMR, weight has fluctuated between 177-201# over the past 1 month. Suspect some changes may be related to fluid. Given such variance of weights, accurate weight status and trends difficult to assess at this time.  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 Prior to 1 month ago, her baseline was 1-2 small meals a day. Over the past month, he reports she has eaten almost nothing. Has been offered every protein drink under the sun but doesn't like them. He notes she does like ice cream.  Patient is currently NPO. Remains confused but has also been complaining of intense abdominal pain and  noted to have severe constipation. CT A/P obtained this afternoon, read pending. Will monitor for diet plans once results available.    Medications reviewed and include: Protonix   Labs reviewed:  Creatinine 3.58 Phosphorus 6.1 Magnesium  2.8 HA1C 7.0 (as of 9/28) Blood Glucose 184-215 since admit   NUTRITION - FOCUSED PHYSICAL EXAM:  Flowsheet Row Most Recent Value  Orbital Region No depletion  Upper Arm Region No depletion  Thoracic and Lumbar Region No depletion  Buccal Region No depletion  Temple Region No depletion  Clavicle Bone Region No depletion  Clavicle and Acromion Bone Region No depletion  Scapular Bone Region Unable to assess  Dorsal Hand No depletion  Patellar Region Unable to assess  [RN just finishing wrapping patients legs]  Anterior Thigh Region Unable to assess  [RN just finishing wrapping patients legs]  Posterior Calf Region Unable to assess  [RN just finishing wrapping patients legs]  Edema (RD Assessment) Mild  Hair Reviewed  Eyes Unable to assess  Mouth Unable to assess  Skin Reviewed  Nails Reviewed    Diet Order:   Diet Order             Diet NPO time specified  Diet effective midnight                   EDUCATION NEEDS:  No education needs have been identified at this time  Skin:  Skin Assessment: Skin Integrity Issues: Skin Integrity Issues:: Stage III, DTI, Unstageable DTI: Left Foot; Right pinky toe Stage III:  Bilateral Coccyx; Right Posterior Thigh Unstageable: Right 4th Toe; Right Foot x2; Right 2nd toe; Right Heel; Left Heel  Last BM:  10/20 - type 4  Height:  Ht Readings from Last 1 Encounters:  02/15/24 5' 6 (1.676 m)   Weight:  Wt Readings from Last 1 Encounters:  02/15/24 86 kg   Ideal Body Weight:  59.09 kg  BMI:  Body mass index is 30.6 kg/m.  Estimated Nutritional Needs:  Kcal:  1800-2000 kcals Protein:  90-105 grams Fluid:  >/= 1.8L    Trude Ned RD, LDN Contact via Secure Chat.

## 2024-02-16 NOTE — Progress Notes (Signed)
 PCCM Interval Progress Note:  Ongoing intractable abdominal pain and hypotension; patient is a poor historian due to AMS, significant pain.   Discussed with Dr. Annella and agree that CT Abdomen/Pelvis with contrast is warranted, despite patient's low GFR. Discussed with patient's brother, [name], at bedside who consents to contrasted study. Will consider Nephrology consult in the setting of AKI; however it is ok to proceed with contrasted CT given benefit > risk at this juncture.  Corean CHRISTELLA Antoine Vandermeulen, PA-C [facility] 02/16/24 1:04 PM  Please see [email or web address] for pager details.  From 7A-7P if no response, please call [phone] After hours, please call ELink [phone]

## 2024-02-16 NOTE — Consult Note (Signed)
 NAME:  Paula Wheeler, MRN:  [id number], DOB:  1951-01-22, LOS: 1 ADMISSION DATE:  02/15/2024 CONSULTATION DATE:  02/16/2024 REFERRING MD:  Cheryle - TRH, CHIEF COMPLAINT:  AMS, hypotension, ?GIB   History of Present Illness:  73 year old woman who presented to [facility] ED 10/19 for AMS. PMHx significant for HTN, HLD, PAF (on Eliquis ), COPD, tobacco dependence, T2DM, IDA, history of GIB. Recent admission 9/27-10/9 for sepsis 2/2 cellulitis, discharged to rehab.   Patient presented to [facility] ED from her rehab facility via EMS for acute mental status change/increased confusion from baseline, per facility staff. On EMS arrival, patient was hypotensive and tachycardic in AF with RVR. Received LR bolus. On ED arrival, patient was afebrile with HR 115, BP 104/60, RR 20, SpO2 96%. Labs were notable for WBC 23.7, Hgb 6.9 (baseline ~9-10), Plt 242. Na 136, K 3.7, CO2 19, glucose 123. AG 22. Cr 2.97 (baseline 2.2-2.5). LFTs WNL. Lipase WNL. Trop 142 > 137, suspect demand-related. BNP 4432. PCT 0.76 > 1.03. LA 0.8. UA unremarkable. FOBT+. CXR with cardiomegaly, vascular congestion, small R pleural effusion/atelectasis. CT Head with generalized atrophy, NAICA. CT Renal Stone Protocol showed patchy RLL consolidation, small bilateral pleural effusions, cardiomegaly with small pericardial effusion, vascular kidney calcifications, moderate stool burden.  On 10/20, patient was noted to have increased WBC count (27.8 from 23.7) with stably low Hgb (7.3 from 6.9), worsening AKI (Cr 3.58 from 2.97) and increased AG to 31 (22). She was noted to be hypotensive with MAP high 50s and HR 110s.  PCCM consulted for evaluation.  Pertinent Medical History:   Past Medical History:  Diagnosis Date   Anemia    COPD (chronic obstructive pulmonary disease) (HCC)    Dyslipidemia    Exogenous obesity    GERD (gastroesophageal reflux disease)    GI bleed 05/14/2016   History of abnormal cells from cervix    Hyperlipidemia     Hypertension    Iron  deficiency anemia    Longstanding persistent atrial fibrillation (HCC)    Microalbuminuria    Tobacco dependence    Type II diabetes mellitus (HCC)    Significant Hospital Events: Including procedures, antibiotic start and stop dates in addition to other pertinent events   10/19 - Presented to [facility] ED from [facility] with AMS, hypotension. Concern for sepsis.  Interim History / Subjective:  PCCM consulted for ICU evaluation/management.  Objective:  Blood pressure (!) 90/49, pulse (!) 123, temperature 97.7 F (36.5 C), temperature source Axillary, resp. rate (!) 23, height 5' 6 (1.676 m), weight 86 kg, SpO2 100%.        Intake/Output Summary (Last 24 hours) at 02/16/2024 1101 Last data filed at 02/16/2024 1015 Gross per 24 hour  Intake 1020.52 ml  Output 700 ml  Net 320.52 ml   Filed Weights   02/15/24 1240  Weight: 86 kg   Physical Examination: General: Acutely ill-appearing older woman, not in distress but appears very uncomfortable. HEENT: [place]/AT, anicteric sclera, PERRL 3mm, dry mucous membranes. Neuro: Awake, lethargic, oriented to self/location. Responds to verbal stimuli. Following commands intermittently. Moves all 4 extremities spontaneously. CV: Tachycardic to 110s, regular rhythm, no m/g/r. PULM: Breathing even and unlabored on 2LNC. Lung fields diminished laterally. GI: Soft, moderately distended, diffusely TTP worst over LUQ/LLQ. Hypoactive bowel sounds. Extremities: Trace bilateral symmetric LE edema noted. Bilateral symmetric 1+ edema noted. Skin: Warm/dry, no rashes.  Resolved Hospital Problem List:    Assessment & Plan:  Undifferentiated shock, presume sepsis versus cardiogenic component d/t  AF/RVR - Upgrade to ICU level of care - Goal MAP > 65 - Volume resuscitation as tolerated, fluid + blood as indicated - Levophed  titrated to goal MAP - Trend WBC, fever curve, LA - F/u Cx data - Continue broad-spectrum antibiotics  (cefepime /Flagyl)  Afib with RVR Afib with RVR noted 10/20AM, longstanding history of AF on Eliquis , RVR new. - Holding AC for now, given hypotension/anemia - Amiodarone gtt for rate control - Optimize electrolyes (K > 4, Mg > 2) - Cardiac monitoring - Repeat Echo - Cards following, appreciate recommendations  Abdominal pain, generalized Severe constipation CT Renal Stone Protocol with patchy RLL consolidation, small bilateral pleural effusions, cardiomegaly with small pericardial effusion, vascular kidney calcifications, moderate stool burden. CT repeated with contrast, given pain out of proportion to exam. - F/u repeat CT A/P with contrast - Multimodal pain control with APAP, Dilaudid  PRN - May need to d/c opioids and administer reversal agent - Aggressive bowel regimen  HTN HLD - Hold home antihypertensives, given hypotension - Resume statin as appropriate  COPD Tobacco dependence history - Supplemental O2 support for sat > 90% - Bronchodilators PRN - Pulmonary hygiene  Anemia, multifactorial History of IDA +FOBT, ?GIB. - Trend H&H - Monitor for signs of active bleeding - Transfuse for Hgb < 7.0 or hemodynamically significant bleeding - S/p 1U PRBCs 10/20  T2DM - SSI - CBGs Q4H - Goal CBG 140-180  Labs:  CBC: Recent Labs  Lab 02/15/24 1313 02/15/24 2221 02/16/24 0257  WBC 23.7*  --  27.8*  NEUTROABS 20.6*  --   --   HGB 6.9* 7.4* 7.3*  HCT 23.3* 24.5* 23.5*  MCV 84.7  --  88.0  PLT 242  --  237   Basic Metabolic Panel: Recent Labs  Lab 02/15/24 1313 02/15/24 2221 02/16/24 0257  NA 136  --  136  K 3.7  --  4.1  CL 94*  --  92*  CO2 19*  --  13*  GLUCOSE 123*  --  209*  BUN 105*  --  118*  CREATININE 2.97*  --  3.58*  CALCIUM  9.0  --  9.5  MG  --  2.8*  --   PHOS  --  6.1*  --    GFR: Estimated Creatinine Clearance: 15.5 mL/min (A) (by C-G formula based on SCr of 3.58 mg/dL (H)). Recent Labs  Lab 02/15/24 1313 02/15/24 1452  02/16/24 0257  PROCALCITON 0.76  --  1.03  WBC 23.7*  --  27.8*  LATICACIDVEN  --  0.8  --    Liver Function Tests: Recent Labs  Lab 02/15/24 1313 02/16/24 0257  AST 33 30  ALT 15 14  ALKPHOS 126 124  BILITOT 0.8 0.9  PROT 5.1* 5.6*  ALBUMIN 2.7* 2.9*   Recent Labs  Lab 02/15/24 1313  LIPASE 13   No results for input(s): AMMONIA in the last 168 hours.  ABG:    Component Value Date/Time   HCO3 12.8 (L) 11/23/2019 1605   TCO2 <5 (L) 11/23/2019 0218   ACIDBASEDEF 14.0 (H) 11/23/2019 1605   O2SAT 49.1 11/23/2019 1605    Coagulation Profile: No results for input(s): INR, PROTIME in the last 168 hours.  Cardiac Enzymes: No results for input(s): CKTOTAL, CKMB, CKMBINDEX, TROPONINI in the last 168 hours.  HbA1C: Hgb A1c MFr Bld  Date/Time Value Ref Range Status  01/25/2024 04:46 AM 7.0 (H) 4.8 - 5.6 % Final    Comment:    (NOTE) Diagnosis of Diabetes The following  HbA1c ranges recommended by the American Diabetes Association (ADA) may be used as an aid in the diagnosis of diabetes mellitus.  Hemoglobin             Suggested A1C NGSP%              Diagnosis  <5.7                   Non Diabetic  5.7-6.4                Pre-Diabetic  >6.4                   Diabetic  <7.0                   Glycemic control for                       adults with diabetes.    07/17/2023 05:06 AM >15.5 (H) 4.8 - 5.6 % Final    Comment:    (NOTE)         Prediabetes: 5.7 - 6.4         Diabetes: >6.4         Glycemic control for adults with diabetes: <7.0    CBG: Recent Labs  Lab 02/15/24 2140 02/16/24 0722  GLUCAP 184* 215*   Review of Systems:   Patient is encephalopathic; therefore, history has been obtained from chart review.   Past Medical History:  She,  has a past medical history of Anemia, COPD (chronic obstructive pulmonary disease) (HCC), Dyslipidemia, Exogenous obesity, GERD (gastroesophageal reflux disease), GI bleed (05/14/2016), History of  abnormal cells from cervix, Hyperlipidemia, Hypertension, Iron  deficiency anemia, Longstanding persistent atrial fibrillation (HCC), Microalbuminuria, Tobacco dependence, and Type II diabetes mellitus (HCC).   Surgical History:   Past Surgical History:  Procedure Laterality Date   CARDIOVERSION N/A 08/07/2016   Procedure: CARDIOVERSION;  Surgeon: Aleene JINNY Passe, MD;  Location: [facility] ENDOSCOPY;  Service: Cardiovascular;  Laterality: N/A;   DIAGNOSTIC MAMMOGRAM  05/2015   ESOPHAGOGASTRODUODENOSCOPY N/A 05/15/2016   Procedure: ESOPHAGOGASTRODUODENOSCOPY (EGD);  Surgeon: Lesta JULIANNA Fitz, MD;  Location: [facility] ENDOSCOPY;  Service: Endoscopy;  Laterality: N/A;   ESOPHAGOGASTRODUODENOSCOPY N/A 01/26/2024   Procedure: EGD (ESOPHAGOGASTRODUODENOSCOPY);  Surgeon: Dianna Specking, MD;  Location: [facility];  Service: Gastroenterology;  Laterality: N/A;   EXPLORATORY LAPAROTOMY  1968   I think they left my appendix   PAP SMEAR  2012 AND 2017   Social History:   reports that she has been smoking cigarettes. She has a 20 pack-year smoking history. She has never been exposed to tobacco smoke. She has never used smokeless tobacco. She reports that she does not currently use alcohol. She reports that she does not use drugs.   Family History:  Her family history includes Cancer in her mother; Colon polyps in her brother, brother, brother, brother, brother, and brother; Diabetes in her father; Heart attack in her father; Hypertension in her mother; Macular degeneration in her mother.   Allergies: Allergies  Allergen Reactions   Nsaids Swelling    Hands and legs swell   Penicillins Hives    Has patient had a PCN reaction causing immediate rash, facial/tongue/throat swelling, SOB or lightheadedness with hypotension: Yes Has patient had a PCN reaction causing severe rash involving mucus membranes or skin necrosis: No Has patient had a PCN reaction that required hospitalization: No Has patient had a PCN reaction  occurring within the last 10  years: No If all of the above answers are NO, then may proceed with Cephalosporin use.    Quinapril  Hcl Other (See Comments)    Other reaction(s): elevated potassium and creatinine   Home Medications: Prior to Admission medications   Medication Sig Start Date End Date Taking? Authorizing Provider  acetaminophen  (TYLENOL ) 500 MG tablet Take 500 mg by mouth every 6 (six) hours as needed for mild pain (pain score 1-3) or moderate pain (pain score 4-6).    [provider]  albuterol  (PROVENTIL  HFA;VENTOLIN  HFA) 108 (90 Base) MCG/ACT inhaler Inhale 2 puffs into the lungs every 4 (four) hours as needed for wheezing or shortness of breath.    [provider]  allopurinol  (ZYLOPRIM ) 100 MG tablet Take 100 mg by mouth in the morning.    [provider]  amLODipine  (NORVASC ) 5 MG tablet Take 5 mg by mouth every morning. 04/26/20   [provider]  apixaban  (ELIQUIS ) 5 MG TABS tablet Take 1 tablet (5 mg total) by mouth 2 (two) times daily. 08/20/23   Nahser, Aleene PARAS, MD  atorvastatin  (LIPITOR) 80 MG tablet Take 1 tablet (80 mg total) by mouth at bedtime. 08/20/23 01/25/24  Nahser, Aleene PARAS, MD  diclofenac  Sodium (VOLTAREN ) 1 % GEL Apply 2 g topically 4 (four) times daily. 02/02/24   Vann, Jessica U, DO  Fluticasone -Umeclidin-Vilant (TRELEGY ELLIPTA ) 100-62.5-25 MCG/ACT AEPB INHALE 1 PUFF INTO THE LUNGS DAILY 12/12/23   Kara Dorn NOVAK, MD  gabapentin (NEURONTIN) 100 MG capsule Take 1 capsule (100 mg total) by mouth 2 (two) times daily. 02/02/24   Vann, Jessica U, DO  HYDROcodone -acetaminophen  (NORCO/VICODIN) 5-325 MG tablet Take 1-2 tablets by mouth every 4 (four) hours as needed for moderate pain (pain score 4-6) or severe pain (pain score 7-10). 02/02/24   Vann, Jessica U, DO  insulin  NPH-regular Human (70-30) 100 UNIT/ML injection Inject 30 Units into the skin 2 (two) times daily with a meal. Inject 30 units into the skin in the morning and  60 units into the skin in the evening 02/02/24   Juvenal Raisin U, DO  ipratropium-albuterol  (DUONEB) 0.5-2.5 (3) MG/3ML SOLN Take 3 mLs by nebulization every 6 (six) hours as needed (SOB). 12/30/23   [provider]  lactulose (CHRONULAC) 10 GM/15ML solution Take 30 mLs (20 g total) by mouth daily. 02/06/24   Al-Sultani, Anmar, MD  loratadine  (CLARITIN ) 10 MG tablet Take 10 mg by mouth in the morning.    [provider]  metolazone (ZAROXOLYN) 2.5 MG tablet Take 1 tablet (2.5 mg total) by mouth daily as needed (edema). 02/02/24   Vann, Jessica U, DO  Nystatin (GERHARDT'S BUTT CREAM) CREA Apply 1 Application topically 2 (two) times daily. 02/02/24   Vann, Jessica U, DO  pantoprazole  (PROTONIX ) 40 MG tablet Take 1 tablet (40 mg total) by mouth 2 (two) times daily. 02/02/24   Vann, Jessica U, DO  polyethylene glycol (MIRALAX  / GLYCOLAX ) 17 g packet Take 17 g by mouth daily. 02/06/24   Al-Sultani, Anmar, MD  senna-docusate (SENOKOT-S) 8.6-50 MG tablet Take 1 tablet by mouth 2 (two) times daily. 02/05/24   Al-Sultani, Anmar, MD  sucralfate (CARAFATE) 1 GM/10ML suspension Take 10 mLs (1 g total) by mouth 4 (four) times daily -  with meals and at bedtime. 02/02/24   Vann, Jessica U, DO  tiZANidine (ZANAFLEX) 4 MG tablet Take 1 tablet (4 mg total) by mouth as needed for muscle spasms. 02/02/24   Vann, Jessica U, DO  torsemide  60  MG TABS Take 60 mg by mouth daily. 02/02/24   Vann, Jessica U, DO    Critical care time:   The patient is critically ill with multiple organ system failure and requires high complexity decision making for assessment and support, frequent evaluation and titration of therapies, advanced monitoring, review of radiographic studies and interpretation of complex data.   Critical Care Time devoted to patient care services, exclusive of separately billable procedures, described in this note is 42 minutes.  Corean CHRISTELLA Jaidan Prevette, PA-C [facility] 02/16/24 11:01  AM  Please see [email or web address] for pager details.  From 7A-7P if no response, please call [phone] After hours, please call ELink [phone]

## 2024-02-16 NOTE — Consult Note (Addendum)
 WOC Nurse Consult Note: patient known to WOC team from previous admit with DTPI B heels and Stage 3 Pressure Injury R ischium  Reason for Consult: multiple wounds  Wound type: 1.  Full thickness R dorsal foot, R 2nd digit, distal 4th digit, R 5th digit, R lateral foot  black eschar/dark hemorrhagic tissue ? R/t PAD 2.  Deep Tissue Pressure Injury R medial foot (base of great toe) purple discoloration  3.  B heels unstageable Pressure Injuries black eschar  4.  R ischium Stage 3 Pressure Injury 60% red 40% tan  5.  Stage 3 Pressure Injury sacrum extending down onto buttocks 50% tan 50% red  Pressure Injury POA: Yes Measurement: see nursing flowsheet  Wound bed: as above  Drainage (amount, consistency, odor) see nursing flowsheet  Periwound: moisture associated skin damage to buttocks  Dressing procedure/placement/frequency:  Cleanse all wounds to toes and dorsal foot with Vashe wound cleanser, do not rinse and allow to air dry.  Paint toe wounds with Betadine and allow to dry. May leave open to air.  Apply Xeroform gauze ([place] [phone]) to dorsal foot daily and secure with silicone foam or Kerlix roll gauze. Cleanse B heels with Vashe, apply Xeroform gauze ([place] [phone]) to wound beds daily and secure with silicone foam or Kerlix roll gauze whichever is preferred. Place B feet in Prevalon boots to offload pressure.  Cleanse R ischium/posterior thigh and sacral wound with Vashe, allow to air dry. Apply 1/4 thick layer of Santyl to wound bed, top with saline moist gauze, dry gauze and silicone foam or ABD pad whichever is preferred.  POC discussed with bedside nurse. WOC team will not follow. Re-consult if further needs arise.   Thank you,    Powell Bar MSN, RN-BC, [employer]

## 2024-02-16 NOTE — Consult Note (Signed)
 [facility] Gastroenterology Consult  Referring Provider: No ref. provider found Primary Care Physician:  Okey Carlin Redbird, MD Primary Gastroenterologist: [facility] - Dr. Celestia  Reason for Consultation: Symptomatic anemia  SUBJECTIVE:   HPI: Paula Wheeler is a 73 y.o. female with past medical history significant for atrial fibrillation on Eliquis , chronic kidney disease, GERD, COPD, hypertension, hyperlipidemia, type 2 diabetes mellitus.  Hospitalized 01/24/2024-02/05/2024 for sepsis due to cellulitis, was evaluated by gastroenterology during this hospitalization for acute on chronic anemia.  She underwent EGD with Dr. Dianna on 01/26/2024 with findings of LA grade C esophagitis, scattered inflammation in the gastric antrum, normal-appearing duodenum.  Biopsies of stomach showed reactive gastropathy, H. pylori negative.  Presented to emergency department 02/15/2024 from rehab facility with altered mentation.  Her baseline is oriented to person, place, time.  Labs significant for acute kidney injury, elevated troponin, leukocytosis, anemia (hemoglobin 6.9, was 9.7 on 02/05/2024).  No lactic acidosis.  Vitals showed tachycardia, hypotension, tachypnea.  CT renal stone study revealed patchy consolidation in R lower lobe, no cholelithiasis, no biliary dilatation, unremarkable pancreas, stomach distended with ingested debris, no bowel obstruction, free air or pneumatosis, moderate amount of retained stool in colon.  Patient seen and evaluated in the intensive care unit.  On my evaluation, able to state her name, location and year.  She noted that she has significant abdominal pain.  She also has lower extremity pain and chest pain.  Other review of systems unable to be reliably obtained, patient quite somnolent.  Per nursing staff, patient had bowel movement overnight which was dark in color, though not melena.  No nausea or vomiting.  Past Medical History:  Diagnosis Date   Anemia    COPD (chronic  obstructive pulmonary disease) (HCC)    Dyslipidemia    Exogenous obesity    GERD (gastroesophageal reflux disease)    GI bleed 05/14/2016   History of abnormal cells from cervix    Hyperlipidemia    Hypertension    Iron  deficiency anemia    Longstanding persistent atrial fibrillation (HCC)    Microalbuminuria    Tobacco dependence    Type II diabetes mellitus (HCC)    Past Surgical History:  Procedure Laterality Date   CARDIOVERSION N/A 08/07/2016   Procedure: CARDIOVERSION;  Surgeon: Aleene JINNY Passe, MD;  Location: [facility] ENDOSCOPY;  Service: Cardiovascular;  Laterality: N/A;   DIAGNOSTIC MAMMOGRAM  05/2015   ESOPHAGOGASTRODUODENOSCOPY N/A 05/15/2016   Procedure: ESOPHAGOGASTRODUODENOSCOPY (EGD);  Surgeon: Lesta JULIANNA Fitz, MD;  Location: [facility] ENDOSCOPY;  Service: Endoscopy;  Laterality: N/A;   ESOPHAGOGASTRODUODENOSCOPY N/A 01/26/2024   Procedure: EGD (ESOPHAGOGASTRODUODENOSCOPY);  Surgeon: Dianna Specking, MD;  Location: [facility];  Service: Gastroenterology;  Laterality: N/A;   EXPLORATORY LAPAROTOMY  1968   I think they left my appendix   PAP SMEAR  2012 AND 2017   Prior to Admission medications   Medication Sig Start Date End Date Taking? Authorizing Provider  acetaminophen  (TYLENOL ) 500 MG tablet Take 500 mg by mouth every 6 (six) hours as needed for mild pain (pain score 1-3), headache or fever.   Yes [provider]  albuterol  (PROVENTIL  HFA;VENTOLIN  HFA) 108 (90 Base) MCG/ACT inhaler Inhale 2 puffs into the lungs every 4 (four) hours as needed for wheezing or shortness of breath.   Yes [provider]  allopurinol  (ZYLOPRIM ) 100 MG tablet Take 100 mg by mouth daily.   Yes [provider]  amLODipine  (NORVASC ) 5 MG tablet Take 5 mg by mouth daily. 04/26/20  Yes  [provider]  apixaban  (ELIQUIS ) 5 MG TABS tablet Take 1 tablet (5 mg total) by mouth 2 (two) times daily. 08/20/23  Yes Nahser, Aleene PARAS, MD  atorvastatin  (LIPITOR) 80 MG tablet  Take 1 tablet (80 mg total) by mouth at bedtime. 08/20/23 04/03/24 Yes Nahser, Aleene PARAS, MD  bisacodyl (DULCOLAX) 10 MG suppository Place 10 mg rectally daily as needed (constipation not responding to scheduled bowel medications).   Yes [provider]  diclofenac  Sodium (VOLTAREN ) 1 % GEL Apply 2 g topically 4 (four) times daily. 02/02/24  Yes Vann, Jessica U, DO  Fluticasone -Umeclidin-Vilant (TRELEGY ELLIPTA ) 100-62.5-25 MCG/ACT AEPB INHALE 1 PUFF INTO THE LUNGS DAILY 12/12/23  Yes Kara Dorn NOVAK, MD  gabapentin (NEURONTIN) 100 MG capsule Take 1 capsule (100 mg total) by mouth 2 (two) times daily. 02/02/24  Yes Vann, Jessica U, DO  HYDROcodone -acetaminophen  (NORCO/VICODIN) 5-325 MG tablet Take 1-2 tablets by mouth every 4 (four) hours as needed for moderate pain (pain score 4-6) or severe pain (pain score 7-10). Patient taking differently: Take 2 tablets by mouth every 6 (six) hours as needed for moderate pain (pain score 4-6) or severe pain (pain score 7-10). 02/02/24  Yes Vann, Jessica U, DO  insulin  NPH-regular Human (70-30) 100 UNIT/ML injection Inject 30 Units into the skin 2 (two) times daily with a meal. Inject 30 units into the skin in the morning and 60 units into the skin in the evening Patient taking differently: Inject 20-30 Units into the skin See admin instructions. Inject 30 units into the skin at 0900 and inject 20 units at 1800. Hold for CBG < 110. 02/02/24  Yes Vann, Jessica U, DO  loratadine  (CLARITIN ) 10 MG tablet Take 10 mg by mouth daily.   Yes [provider]  metolazone (ZAROXOLYN) 2.5 MG tablet Take 1 tablet (2.5 mg total) by mouth daily as needed (edema). Patient taking differently: Take 5 mg by mouth daily. 02/02/24  Yes Vann, Jessica U, DO  Nutritional Supplements (NUTRITIONAL SHAKE) LIQD Take 120 mLs by mouth 2 (two) times daily. House Shake   Yes [provider]  pantoprazole  (PROTONIX ) 40 MG tablet Take 1 tablet (40 mg total) by mouth 2 (two) times  daily. 02/02/24  Yes Vann, Jessica U, DO  PROTEIN PO Take 30 mLs by mouth 2 (two) times daily. Liquid Protein   Yes [provider]  senna-docusate (SENOKOT-S) 8.6-50 MG tablet Take 1 tablet by mouth 2 (two) times daily. Patient taking differently: Take 2 tablets by mouth 2 (two) times daily. 02/05/24  Yes Al-Sultani, Anmar, MD  Skin Protectants, Misc. (EUCERIN) cream Apply 1 Application topically at bedtime.   Yes [provider]  sucralfate (CARAFATE) 1 GM/10ML suspension Take 10 mLs (1 g total) by mouth 4 (four) times daily -  with meals and at bedtime. 02/02/24  Yes Vann, Jessica U, DO  tiZANidine (ZANAFLEX) 4 MG tablet Take 1 tablet (4 mg total) by mouth as needed for muscle spasms. 02/02/24  Yes Vann, Jessica U, DO  torsemide  (DEMADEX ) 20 MG tablet Take 60 mg by mouth daily.   Yes [provider]  UNABLE TO FIND Apply 1 application  topically See admin instructions. Unspecified wound care: RIGHT ISCHIUM: every day shift - cleanse area to right ischium with Dakins solution, apply collagen particles to wound bed, cover with zinc barrier cream, leave open to air.  LEFT HEEL: every day shift - clean area to left heel with Dakins solution, apply Santyl to entire bed, wound, cover  with normal saline gauze, skin prep peri wound, cover with foam dressing daily and as needed. RIGHT FOOT DORSUM: every day shift - cleanse area to right food dorsum with Dakin solution, apply honey fiber to wound bed, skin prep to peri wound cover with foam dressing daily and as needed. RIGHT LEG LATERAL: every day shift - cleanse area to right leg lateral with Dakins solution, apply xerofoam gauze to open area, cover with dry dressing daily and as needed. RIGHT BUTTOCK: every day shift - clean area with Dakins solution to right buttock, apply honey fiber to wound bed, zince to peri wound cover with foam dressing daily and as needed. RIGHT 2ND TOE: every day shift - paint right 2nd toe with Betadine  daily and leave open to air. RIGHT FOOT LATERAL: every day shift - paint right foot lateral with Betadine daily and leave open to air.   Yes [provider]  zinc oxide 20 % ointment Apply 1 Application topically See admin instructions. Apply to L buttock topically every day shift - cleanse area with Dakins solution, apply honey fiber to wound bed, since to peri wound cover with foam dressing daily and as needed.   Yes [provider]  lactulose (CHRONULAC) 10 GM/15ML solution Take 30 mLs (20 g total) by mouth daily. Patient not taking: Reported on 02/16/2024 02/06/24   Al-Sultani, Anmar, MD  linezolid (ZYVOX) 600 MG tablet Take 600 mg by mouth 2 (two) times daily. Patient not taking: Reported on 02/16/2024    [provider]  Nystatin (GERHARDT'S BUTT CREAM) CREA Apply 1 Application topically 2 (two) times daily. Patient not taking: Reported on 02/16/2024 02/02/24   Vann, Jessica U, DO  polyethylene glycol (MIRALAX  / GLYCOLAX ) 17 g packet Take 17 g by mouth daily. Patient not taking: Reported on 02/16/2024 02/06/24   Al-Sultani, Anmar, MD   Current Facility-Administered Medications  Medication Dose Route Frequency Provider Last Rate Last Admin   0.9 %  sodium chloride  infusion  250 mL Intravenous Continuous Ilah Krabbe M, PA-C       acetaminophen  (TYLENOL ) tablet 650 mg  650 mg Oral Q6H PRN Celinda Alm Lot, MD       Or   acetaminophen  (TYLENOL ) suppository 650 mg  650 mg Rectal Q6H PRN Celinda Alm Lot, MD       amiodarone (NEXTERONE PREMIX) 360-4.14 MG/200ML-% (1.8 mg/mL) IV infusion  60 mg/hr Intravenous Continuous Garrick Leontine SAILOR, PA-C 16.67 mL/hr at 02/16/24 1243 30 mg/hr at 02/16/24 1243   amiodarone (NEXTERONE PREMIX) 360-4.14 MG/200ML-% (1.8 mg/mL) IV infusion  30 mg/hr Intravenous Continuous Garrick Leontine SAILOR, PA-C       ceFEPIme  (MAXIPIME ) 2 g in sodium chloride  0.9 % 100 mL IVPB  2 g Intravenous Q24H Ilah Krabbe M, PA-C       Chlorhexidine   Gluconate Cloth 2 % PADS 6 each  6 each Topical Daily Celinda Alm Lot, MD   6 each at 02/16/24 1020   collagenase (SANTYL) ointment   Topical Daily Celinda Alm Lot, MD   Given at 02/16/24 1020   HYDROmorphone  (DILAUDID ) injection 0.5-1 mg  0.5-1 mg Intravenous Q2H PRN Ilah Krabbe M, PA-C       [START ON 02/17/2024] Influenza vac split trivalent PF (FLUZONE HIGH-DOSE) injection 0.5 mL  0.5 mL Intramuscular Tomorrow-1000 Alekh, Kshitiz, MD       insulin  aspart (novoLOG ) injection 0-9 Units  0-9 Units Subcutaneous TID WC Celinda Alm Lot, MD   3 Units at 02/16/24 1241  levalbuterol  (XOPENEX ) nebulizer solution 1.25 mg  1.25 mg Nebulization Q6H PRN Cheryle Page, MD       metroNIDAZOLE (FLAGYL) IVPB 500 mg  500 mg Intravenous Q12H Ilah Krabbe M, PA-C       norepinephrine  (LEVOPHED ) 4mg  in (0.016 mg/mL) premix infusion  0-10 mcg/min Intravenous Titrated Ilah Krabbe HERO, PA-C 15 mL/hr at 02/16/24 1243 4 mcg/min at 02/16/24 1243   ondansetron  (ZOFRAN ) tablet 4 mg  4 mg Oral Q6H PRN Celinda Alm Lot, MD       Or   ondansetron  (ZOFRAN ) injection 4 mg  4 mg Intravenous Q6H PRN Celinda Alm Lot, MD       Oral care mouth rinse  15 mL Mouth Rinse 4 times per day Celinda Alm Lot, MD   15 mL at 02/15/24 2201   Oral care mouth rinse  15 mL Mouth Rinse PRN Celinda Alm Lot, MD       pantoprazole  (PROTONIX ) injection 40 mg  40 mg Intravenous Q12H Celinda Alm Lot, MD   40 mg at 02/16/24 1009   Allergies as of 02/15/2024 - Review Complete 02/15/2024  Allergen Reaction Noted   Nsaids Swelling 02/27/2016   Penicillins Hives 02/27/2016   Quinapril  hcl Other (See Comments) 06/20/2021   Family History  Problem Relation Age of Onset   Cancer Mother        BREAST   Hypertension Mother    Macular degeneration Mother    Heart attack Father    Diabetes Father    Colon polyps Brother    Colon polyps Brother    Colon polyps Brother    Colon polyps Brother    Colon  polyps Brother    Colon polyps Brother    Social History   Socioeconomic History   Marital status: Single    Spouse name: Not on file   Number of children: Not on file   Years of education: Not on file   Highest education level: Not on file  Occupational History   Not on file  Tobacco Use   Smoking status: Every Day    Current packs/day: 0.50    Average packs/day: 0.5 packs/day for 40.0 years (20.0 ttl pk-yrs)    Types: Cigarettes    Passive exposure: Never   Smokeless tobacco: Never   Tobacco comments:    Still smoking 8-10 cigarettes a day. 10/25/2022 Tay  Vaping Use   Vaping status: Never Used  Substance and Sexual Activity   Alcohol use: Not Currently    Comment: 05/14/2016 I'll have a drink once/year; New Year's   Drug use: No   Sexual activity: Never  Other Topics Concern   Not on file  Social History Narrative   Not on file   Social Drivers of Health   Financial Resource Strain: Not on file  Food Insecurity: Patient Unable To Answer (02/15/2024)   Hunger Vital Sign    Worried About Running Out of Food in the Last Year: Patient unable to answer    Ran Out of Food in the Last Year: Patient unable to answer  Transportation Needs: Patient Unable To Answer (02/15/2024)   PRAPARE - Transportation    Lack of Transportation (Medical): Patient unable to answer    Lack of Transportation (Non-Medical): Patient unable to answer  Physical Activity: Not on file  Stress: Not on file  Social Connections: Patient Unable To Answer (02/15/2024)   Social Connection and Isolation Panel    Frequency of Communication with Friends and Family: Patient unable  to answer    Frequency of Social Gatherings with Friends and Family: Patient unable to answer    Attends Religious Services: Patient unable to answer    Active Member of Clubs or Organizations: Patient unable to answer    Attends Banker Meetings: Patient unable to answer    Marital Status: Patient unable to  answer  Recent Concern: Social Connections - Socially Isolated (01/24/2024)   Social Connection and Isolation Panel    Frequency of Communication with Friends and Family: More than three times a week    Frequency of Social Gatherings with Friends and Family: More than three times a week    Attends Religious Services: Never    Database administrator or Organizations: No    Attends Banker Meetings: Never    Marital Status: Never married  Intimate Partner Violence: Patient Unable To Answer (02/15/2024)   Humiliation, Afraid, Rape, and Kick questionnaire    Fear of Current or Ex-Partner: Patient unable to answer    Emotionally Abused: Patient unable to answer    Physically Abused: Patient unable to answer    Sexually Abused: Patient unable to answer   Review of Systems:  Review of Systems  Cardiovascular:  Positive for chest pain.  Gastrointestinal:  Positive for abdominal pain.    OBJECTIVE:   Temp:  [97.6 F (36.4 C)-98.5 F (36.9 C)] 97.8 F (36.6 C) (10/20 1121) Pulse Rate:  [54-156] 107 (10/20 1130) Resp:  [14-33] 26 (10/20 1130) BP: (82-138)/(33-87) 105/42 (10/20 1130) SpO2:  [59 %-100 %] 99 % (10/20 1130) Last BM Date : 02/15/24 Physical Exam Constitutional:      Appearance: She is ill-appearing.  Cardiovascular:     Rate and Rhythm: Tachycardia present. Rhythm irregular.  Pulmonary:     Effort: No respiratory distress.     Breath sounds: Normal breath sounds.  Abdominal:     General: Bowel sounds are normal. There is no distension.     Palpations: Abdomen is soft.     Tenderness: There is abdominal tenderness.  Neurological:     Mental Status: She is easily aroused. She is lethargic.     Labs: Recent Labs    02/15/24 1313 02/15/24 2221 02/16/24 0257  WBC 23.7*  --  27.8*  HGB 6.9* 7.4* 7.3*  HCT 23.3* 24.5* 23.5*  PLT 242  --  237   BMET Recent Labs    02/15/24 1313 02/16/24 0257  NA 136 136  K 3.7 4.1  CL 94* 92*  CO2 19* 13*   GLUCOSE 123* 209*  BUN 105* 118*  CREATININE 2.97* 3.58*  CALCIUM  9.0 9.5   LFT Recent Labs    02/16/24 0257  PROT 5.6*  ALBUMIN 2.9*  AST 30  ALT 14  ALKPHOS 124  BILITOT 0.9   PT/INR No results for input(s): LABPROT, INR in the last 72 hours.  Diagnostic imaging: CT Renal Stone Study Result Date: 02/15/2024 CLINICAL DATA:  Abdominal/flank pain, stone suspected. EXAM: CT ABDOMEN AND PELVIS WITHOUT CONTRAST TECHNIQUE: Multidetector CT imaging of the abdomen and pelvis was performed following the standard protocol without IV contrast. RADIATION DOSE REDUCTION: This exam was performed according to the departmental dose-optimization program which includes automated exposure control, adjustment of the mA and/or kV according to patient size and/or use of iterative reconstruction technique. COMPARISON:  09/27/2023. FINDINGS: Lower chest: Multi-vessel coronary artery calcifications are noted. The heart is enlarged and there is a small pericardial effusion. There are small bilateral pleural effusions with  atelectasis bilaterally. Patchy consolidation is noted in the right lower lobe. Hepatobiliary: No focal liver abnormality is seen. No gallstones, gallbladder wall thickening, or biliary dilatation. Pancreas: Unremarkable. No pancreatic ductal dilatation or surrounding inflammatory changes. Spleen: Normal in size without focal abnormality. Adrenals/Urinary Tract: The adrenal glands appear stable. There is right renal atrophy. A stable exophytic lesion is noted in the lower pole the left kidney. Predominantly vascular calcifications are seen in the kidneys bilaterally, however the possibility of small renal calculus cannot be excluded. No ureteral calculus or obstructive uropathy bilaterally. A Foley catheter is present in a decompressed urinary bladder. Stomach/Bowel: The stomach is distended with ingested debris. No bowel obstruction, free air, or pneumatosis is seen. A moderate amount of  retained stool is present in the colon. Appendix is not seen. Vascular/Lymphatic: Aortic atherosclerosis. No enlarged abdominal or pelvic lymph nodes. Reproductive: Uterus and bilateral adnexa are unremarkable. Other: No abdominopelvic ascites.  Moderate anasarca is noted. Musculoskeletal: Degenerative changes are present in the thoracolumbar spine. No acute osseous abnormality is seen. IMPRESSION: 1. Patchy right lower lobe consolidation, suspicious for pneumonia. 2. Small bilateral pleural effusions. 3. Vascular calcifications in the kidneys bilaterally with possible small renal calculi. No obstructive uropathy is seen. 4. Moderate amount of retained stool in the colon. 5. Cardiomegaly with small pericardial effusion and coronary artery calcifications. 6. Aortic atherosclerosis. Electronically Signed   By: Leita Birmingham M.D.   On: 02/15/2024 14:44   CT Head Wo Contrast Result Date: 02/15/2024 CLINICAL DATA:  Altered mental status. EXAM: CT HEAD WITHOUT CONTRAST TECHNIQUE: Contiguous axial images were obtained from the base of the skull through the vertex without intravenous contrast. RADIATION DOSE REDUCTION: This exam was performed according to the departmental dose-optimization program which includes automated exposure control, adjustment of the mA and/or kV according to patient size and/or use of iterative reconstruction technique. COMPARISON:  July 16, 2023 FINDINGS: Brain: There is generalized cerebral atrophy with widening of the extra-axial spaces and ventricular dilatation. There are areas of decreased attenuation within the white matter tracts of the supratentorial brain, consistent with microvascular disease changes. Vascular: No hyperdense vessel or unexpected calcification. Skull: Normal. Negative for fracture or focal lesion. Sinuses/Orbits: No acute finding. Other: None. IMPRESSION: 1. Generalized cerebral atrophy and microvascular disease changes of the supratentorial brain. 2. No acute  intracranial abnormality. Electronically Signed   By: Suzen Dials M.D.   On: 02/15/2024 14:35   DG Chest Portable 1 View Result Date: 02/15/2024 CLINICAL DATA:  Infection. EXAM: PORTABLE CHEST 1 VIEW COMPARISON:  Shortness dated 01/24/2024 FINDINGS: Cardiomegaly with vascular congestion. Small right pleural effusion and right lung base atelectasis or scarring. Faint diffuse asymmetric right lung hazy density may represent asymmetric edema, atelectasis, or pneumonia. No pneumothorax. No acute osseous pathology. IMPRESSION: 1. Cardiomegaly with vascular congestion. 2. Small right pleural effusion and right lung base atelectasis or scarring. 3. Faint diffuse asymmetric right lung hazy density may represent asymmetric edema, atelectasis, or pneumonia. Electronically Signed   By: Vanetta Chou M.D.   On: 02/15/2024 13:33   IMPRESSION: Acute on chronic anemia SIRS, concern for sepsis, concern for right sided pneumonia on imaging Altered mentation, suspect from above Abdominal pain, undetermined Atrial fibrillation with rapid ventricular response, on Eliquis  Chronic obstructive pulmonary disease  PLAN: -Recommend abdominal imaging given abdominal pain and SIRS, CT scan of abdomen pelvis with contrast is pending -Trend H/H, transfuse for Hgb < 7 -Continue IV PPI therapy -Monitor bowel movements -[facility] will follow   LOS: 1 day  Estefana Keas, DO [facility] Gastroenterology

## 2024-02-16 NOTE — Progress Notes (Signed)
  Echocardiogram 2D Echocardiogram has been performed.  Paula Wheeler 02/16/2024, 3:31 PM

## 2024-02-16 NOTE — Progress Notes (Signed)
 Pt in bed resting with eyes closed, breathing unlabored, call bell in reach with bed at lowest position and side rails up. Floor mats applied and bed exit alarm on.

## 2024-02-16 NOTE — Consult Note (Addendum)
 Cardiology Consultation   Patient ID: Paula Wheeler MRN: [id number]; DOB: Dec 14, 1950  Admit date: 02/15/2024 Date of Consult: 02/16/2024  PCP:  Okey Carlin Redbird, MD   [facility] Providers Cardiologist:  Aleene Passe, MD (Inactive)        Patient Profile: Paula Wheeler is a 73 y.o. female with a hx of hypertension, persistent atrial fibrillation/flutter, hyperlipidemia, severe bilateral carotid artery stenosis, chronic diastolic heart failure, peripheral claudication, CKD, T2DM, COPD, hx of GI bleed and tobacco use who is being seen 02/16/2024 for the evaluation of heart failure and atrial fibrillation at the request of Sophie Mao MD.  History of Present Illness: [name] was last seen outpatient by [facility] on 08/20/23 by Dr. Calhoun. Patient at that time had reported she had been in the hospital due to frequent falls 2/2 weakness. Patient was suppose to have a Myoview in 2024 for possible TCAR, however patient never went to appointment.   Most recent echocardiogram 06/2023 LVEF 60-65% with no RWMA. Indeterminate DD/ Low-normal RV systolic function. Moderately dilated LA. Mild MR.   Recently discharged [10/6] after admission for sepsis 2/2 BLE cellulitis and acute on chronic anemia. Course c/b acute on chronic HF. She received IV antibiotics/diuresis and blood products during this admission. She had a previous EGD 12/2023 showing erosive esophagitis and gastritis. She had been resumed on eliquis  by discharge.   Presented to the ED on 10/19 via EMS for altered mental status.  BP: 104/60   HR 115   Spo2 96%, then placed on [facility] 3L for saturation drop to 59% Serial ECG AF with PVC EV 111 CXR showed cardiomegaly with vascular congestion, small RT pleural effusion, and possible pneumonia CT head showed generalized atrophy though no acute intracranial pathology CT renal showed small pericardial effusion with coronary/aortic atherosclerosis and continued evidence of  volume overload to the respiratory system and possible PNA.   Pertinent lab work:  Cr 2.97 -> 3.58    Mag 2.8    Phos 6.1   Albumin 2.7   hgb 6.9    WBC 23.7 -> 2.67 with left shift   Lactic 0.8 Troponin 147 -> 137    Pro-BNP 4432 Fecal occult blood was positive   In the ED received IV fluids, antibiotics, albumin, and pain medications. She has received 1 unit of PRBC without appropriate response in hgb, and she is now receiving 1 unit of PRBC. PTA eliquis  on hold.  Net IO Since Admission: 320.52 mL [02/16/24 1019]  On interview, patient is not alert and does not follow commands.    Past Medical History:  Diagnosis Date   Anemia    COPD (chronic obstructive pulmonary disease) (HCC)    Dyslipidemia    Exogenous obesity    GERD (gastroesophageal reflux disease)    GI bleed 05/14/2016   History of abnormal cells from cervix    Hyperlipidemia    Hypertension    Iron  deficiency anemia    Longstanding persistent atrial fibrillation (HCC)    Microalbuminuria    Tobacco dependence    Type II diabetes mellitus (HCC)     Past Surgical History:  Procedure Laterality Date   CARDIOVERSION N/A 08/07/2016   Procedure: CARDIOVERSION;  Surgeon: Aleene JINNY Passe, MD;  Location: [facility] ENDOSCOPY;  Service: Cardiovascular;  Laterality: N/A;   DIAGNOSTIC MAMMOGRAM  05/2015   ESOPHAGOGASTRODUODENOSCOPY N/A 05/15/2016   Procedure: ESOPHAGOGASTRODUODENOSCOPY (EGD);  Surgeon: Lesta JULIANNA Fitz, MD;  Location: [facility] ENDOSCOPY;  Service: Endoscopy;  Laterality: N/A;  ESOPHAGOGASTRODUODENOSCOPY N/A 01/26/2024   Procedure: EGD (ESOPHAGOGASTRODUODENOSCOPY);  Surgeon: Dianna Specking, MD;  Location: [facility];  Service: Gastroenterology;  Laterality: N/A;   EXPLORATORY LAPAROTOMY  1968   I think they left my appendix   PAP SMEAR  2012 AND 2017       Scheduled Meds:  Chlorhexidine  Gluconate Cloth  6 each Topical Daily   collagenase   Topical Daily   [START ON 02/17/2024] Influenza vac split trivalent PF   0.5 mL Intramuscular Tomorrow-1000   insulin  aspart  0-9 Units Subcutaneous TID WC   mouth rinse  15 mL Mouth Rinse 4 times per day   pantoprazole  (PROTONIX ) IV  40 mg Intravenous Q12H   Continuous Infusions:  azithromycin      azithromycin      cefTRIAXone  (ROCEPHIN )  IV     sodium bicarbonate  150 mEq in sterile water  1,150 mL infusion 75 mL/hr at 02/16/24 0839   vancomycin      PRN Meds: acetaminophen  **OR** acetaminophen , HYDROmorphone  (DILAUDID ) injection, levalbuterol , ondansetron  **OR** ondansetron  (ZOFRAN ) IV, mouth rinse  Allergies:    Allergies  Allergen Reactions   Nsaids Swelling    Hands and legs swell   Penicillins Hives    Has patient had a PCN reaction causing immediate rash, facial/tongue/throat swelling, SOB or lightheadedness with hypotension: Yes Has patient had a PCN reaction causing severe rash involving mucus membranes or skin necrosis: No Has patient had a PCN reaction that required hospitalization: No Has patient had a PCN reaction occurring within the last 10 years: No If all of the above answers are NO, then may proceed with Cephalosporin use.    Quinapril  Hcl Other (See Comments)    Other reaction(s): elevated potassium and creatinine    Social History:   Social History   Socioeconomic History   Marital status: Single    Spouse name: Not on file   Number of children: Not on file   Years of education: Not on file   Highest education level: Not on file  Occupational History   Not on file  Tobacco Use   Smoking status: Every Day    Current packs/day: 0.50    Average packs/day: 0.5 packs/day for 40.0 years (20.0 ttl pk-yrs)    Types: Cigarettes    Passive exposure: Never   Smokeless tobacco: Never   Tobacco comments:    Still smoking 8-10 cigarettes a day. 10/25/2022 Tay  Vaping Use   Vaping status: Never Used  Substance and Sexual Activity   Alcohol use: Not Currently    Comment: 05/14/2016 I'll have a drink once/year; New Year's   Drug  use: No   Sexual activity: Never  Other Topics Concern   Not on file  Social History Narrative   Not on file   Social Drivers of Health   Financial Resource Strain: Not on file  Food Insecurity: Patient Unable To Answer (02/15/2024)   Hunger Vital Sign    Worried About Running Out of Food in the Last Year: Patient unable to answer    Ran Out of Food in the Last Year: Patient unable to answer  Transportation Needs: Patient Unable To Answer (02/15/2024)   PRAPARE - Transportation    Lack of Transportation (Medical): Patient unable to answer    Lack of Transportation (Non-Medical): Patient unable to answer  Physical Activity: Not on file  Stress: Not on file  Social Connections: Patient Unable To Answer (02/15/2024)   Social Connection and Isolation Panel    Frequency of Communication with Friends and  Family: Patient unable to answer    Frequency of Social Gatherings with Friends and Family: Patient unable to answer    Attends Religious Services: Patient unable to answer    Active Member of Clubs or Organizations: Patient unable to answer    Attends Banker Meetings: Patient unable to answer    Marital Status: Patient unable to answer  Recent Concern: Social Connections - Socially Isolated (01/24/2024)   Social Connection and Isolation Panel    Frequency of Communication with Friends and Family: More than three times a week    Frequency of Social Gatherings with Friends and Family: More than three times a week    Attends Religious Services: Never    Database administrator or Organizations: No    Attends Banker Meetings: Never    Marital Status: Never married  Intimate Partner Violence: Patient Unable To Answer (02/15/2024)   Humiliation, Afraid, Rape, and Kick questionnaire    Fear of Current or Ex-Partner: Patient unable to answer    Emotionally Abused: Patient unable to answer    Physically Abused: Patient unable to answer    Sexually Abused: Patient  unable to answer    Family History:   Family History  Problem Relation Age of Onset   Cancer Mother        BREAST   Hypertension Mother    Macular degeneration Mother    Heart attack Father    Diabetes Father    Colon polyps Brother    Colon polyps Brother    Colon polyps Brother    Colon polyps Brother    Colon polyps Brother    Colon polyps Brother      ROS:  Please see the history of present illness.  All other ROS reviewed and negative.     Physical Exam/Data: Vitals:   02/16/24 0845 02/16/24 0900 02/16/24 0915 02/16/24 0930  BP: (!) 105/48 (!) 97/45 (!) 93/51 (!) 91/50  Pulse: (!) 128 (!) 130 (!) 128 (!) 131  Resp: (!) 30 15 17 20   Temp: 98 F (36.7 C)     TempSrc: Oral     SpO2: 98% 98% 99% 98%  Weight:      Height:        Intake/Output Summary (Last 24 hours) at 02/16/2024 0955 Last data filed at 02/16/2024 0643 Gross per 24 hour  Intake 653.02 ml  Output 700 ml  Net -46.98 ml      02/15/2024   12:40 PM 02/05/2024    5:00 AM 02/03/2024    4:23 AM  Last 3 Weights  Weight (lbs) 189 lb 9.5 oz 190 lb 4.8 oz 181 lb  Weight (kg) 86 kg 86.32 kg 82.1 kg     Body mass index is 30.6 kg/m.  General:  Chronically ill appearing female laying in bed HEENT: normal Neck: unable to assess JVD as patient's head is slumped right and not following commands Vascular: Distal pulses 1+ bilaterally Cardiac:  normal S1, S2; RRR; no murmur  Lungs:  clear to auscultation bilaterally, no wheezing, rhonchi or rales  Abd: soft, nontender, no hepatomegaly  Ext: no edema, SCDs in place, underneath erythema within drawn margins Musculoskeletal:  No deformities, BUE and BLE strength normal and equal Skin: warm and dry  Neuro:  CNs 2-12 intact, no focal abnormalities noted Psych:  Patient is not alert/ orientated though is arousable   EKG:  The EKG was personally reviewed and demonstrates:  see hpi Telemetry:  Telemetry was personally  reviewed and demonstrates:  AF/AFL RVR HR >  100 ~120. Frequent ectopy.   Relevant CV Studies: Echocardiogram 02/16/24 IMPRESSIONS     1. Left ventricular ejection fraction, by estimation, is 60 to 65%. Left  ventricular ejection fraction by PLAX is 61 %. The left ventricle has  normal function. The left ventricle has no regional wall motion  abnormalities. Left ventricular diastolic  parameters are indeterminate. Elevated left ventricular end-diastolic  pressure.   2. Right ventricular systolic function is low normal. The right  ventricular size is normal. There is normal pulmonary artery systolic  pressure. The estimated right ventricular systolic pressure is 28.5 mmHg.   3. Left atrial size was moderately dilated.   4. The mitral valve is grossly normal. Mild mitral valve regurgitation.   5. The aortic valve is tricuspid. Aortic valve regurgitation is not  visualized.   6. Aortic dilatation noted. There is mild dilatation of the ascending  aorta, measuring 41 mm.   7. The inferior vena cava is dilated in size with <50% respiratory  variability, suggesting right atrial pressure of 15 mmHg.    Laboratory Data: Chemistry Recent Labs  Lab 02/15/24 1313 02/15/24 2221 02/16/24 0257  NA 136  --  136  K 3.7  --  4.1  CL 94*  --  92*  CO2 19*  --  13*  GLUCOSE 123*  --  209*  BUN 105*  --  118*  CREATININE 2.97*  --  3.58*  CALCIUM  9.0  --  9.5  MG  --  2.8*  --   GFRNONAA 16*  --  13*  ANIONGAP 22*  --  31*    Recent Labs  Lab 02/15/24 1313 02/16/24 0257  PROT 5.1* 5.6*  ALBUMIN 2.7* 2.9*  AST 33 30  ALT 15 14  ALKPHOS 126 124  BILITOT 0.8 0.9   Hematology Recent Labs  Lab 02/15/24 1313 02/15/24 2221 02/16/24 0257  WBC 23.7*  --  27.8*  RBC 2.75*  --  2.67*  HGB 6.9* 7.4* 7.3*  HCT 23.3* 24.5* 23.5*  MCV 84.7  --  88.0  MCH 25.1*  --  27.3  MCHC 29.6*  --  31.1  RDW 21.4*  --  20.0*  PLT 242  --  237   BNP Recent Labs  Lab 02/15/24 2221  PROBNP 4,432.0*     Radiology/Studies:  CT Renal  Stone Study Result Date: 02/15/2024 CLINICAL DATA:  Abdominal/flank pain, stone suspected. EXAM: CT ABDOMEN AND PELVIS WITHOUT CONTRAST TECHNIQUE: Multidetector CT imaging of the abdomen and pelvis was performed following the standard protocol without IV contrast. RADIATION DOSE REDUCTION: This exam was performed according to the departmental dose-optimization program which includes automated exposure control, adjustment of the mA and/or kV according to patient size and/or use of iterative reconstruction technique. COMPARISON:  09/27/2023. FINDINGS: Lower chest: Multi-vessel coronary artery calcifications are noted. The heart is enlarged and there is a small pericardial effusion. There are small bilateral pleural effusions with atelectasis bilaterally. Patchy consolidation is noted in the right lower lobe. Hepatobiliary: No focal liver abnormality is seen. No gallstones, gallbladder wall thickening, or biliary dilatation. Pancreas: Unremarkable. No pancreatic ductal dilatation or surrounding inflammatory changes. Spleen: Normal in size without focal abnormality. Adrenals/Urinary Tract: The adrenal glands appear stable. There is right renal atrophy. A stable exophytic lesion is noted in the lower pole the left kidney. Predominantly vascular calcifications are seen in the kidneys bilaterally, however the possibility of small renal calculus cannot be excluded. No  ureteral calculus or obstructive uropathy bilaterally. A Foley catheter is present in a decompressed urinary bladder. Stomach/Bowel: The stomach is distended with ingested debris. No bowel obstruction, free air, or pneumatosis is seen. A moderate amount of retained stool is present in the colon. Appendix is not seen. Vascular/Lymphatic: Aortic atherosclerosis. No enlarged abdominal or pelvic lymph nodes. Reproductive: Uterus and bilateral adnexa are unremarkable. Other: No abdominopelvic ascites.  Moderate anasarca is noted. Musculoskeletal: Degenerative  changes are present in the thoracolumbar spine. No acute osseous abnormality is seen. IMPRESSION: 1. Patchy right lower lobe consolidation, suspicious for pneumonia. 2. Small bilateral pleural effusions. 3. Vascular calcifications in the kidneys bilaterally with possible small renal calculi. No obstructive uropathy is seen. 4. Moderate amount of retained stool in the colon. 5. Cardiomegaly with small pericardial effusion and coronary artery calcifications. 6. Aortic atherosclerosis. Electronically Signed   By: Leita Birmingham M.D.   On: 02/15/2024 14:44   CT Head Wo Contrast Result Date: 02/15/2024 CLINICAL DATA:  Altered mental status. EXAM: CT HEAD WITHOUT CONTRAST TECHNIQUE: Contiguous axial images were obtained from the base of the skull through the vertex without intravenous contrast. RADIATION DOSE REDUCTION: This exam was performed according to the departmental dose-optimization program which includes automated exposure control, adjustment of the mA and/or kV according to patient size and/or use of iterative reconstruction technique. COMPARISON:  July 16, 2023 FINDINGS: Brain: There is generalized cerebral atrophy with widening of the extra-axial spaces and ventricular dilatation. There are areas of decreased attenuation within the white matter tracts of the supratentorial brain, consistent with microvascular disease changes. Vascular: No hyperdense vessel or unexpected calcification. Skull: Normal. Negative for fracture or focal lesion. Sinuses/Orbits: No acute finding. Other: None. IMPRESSION: 1. Generalized cerebral atrophy and microvascular disease changes of the supratentorial brain. 2. No acute intracranial abnormality. Electronically Signed   By: Suzen Dials M.D.   On: 02/15/2024 14:35   DG Chest Portable 1 View Result Date: 02/15/2024 CLINICAL DATA:  Infection. EXAM: PORTABLE CHEST 1 VIEW COMPARISON:  Shortness dated 01/24/2024 FINDINGS: Cardiomegaly with vascular congestion. Small right  pleural effusion and right lung base atelectasis or scarring. Faint diffuse asymmetric right lung hazy density may represent asymmetric edema, atelectasis, or pneumonia. No pneumothorax. No acute osseous pathology. IMPRESSION: 1. Cardiomegaly with vascular congestion. 2. Small right pleural effusion and right lung base atelectasis or scarring. 3. Faint diffuse asymmetric right lung hazy density may represent asymmetric edema, atelectasis, or pneumonia. Electronically Signed   By: Vanetta Chou M.D.   On: 02/15/2024 13:33     Assessment and Plan: Acute on Chronic heart failure Hypoalbuminemia [2.7 received IV albumin now 2.9] On admission imaging showing signs of volume overload  Pro-BNP 4432  Has received IV fluids, albumin, and antibiotics as well as blood products. Net IO Since Admission: 320.52 mL [02/16/24 1035] PTA medications bisoprolol , torsemide  60 mg in the am and 20 mg in the evening with metolazone 2.5 mg as needed. On hold.   Would recommend nephrology consult for diuresis given AKI and electrolyte disturbance [see below]. Will order limited echocardiogram Continue to hold PTA medications with hypotension and renal function  Elevated troponin ECG without acute signs of ischemia Trend low and flat Most likely demand ischemia. Patient does have coronary calcifications and was suppose to have Myoview outpatient. She is currently not a candidate to go to the cath lab   Persistent vs permanent atrial fibrillation with RVR History of DCCV in 2018, reverted back to AF several weeks later CHAD Vasc 6  Continue to hold anticoagulation with GI bleed, re-start when okay by GI/primary  Given hypotension unable to use AV nodal blocking agent [PTA bisoprolol  on hold]. AKI precludes short term digoxin. Will use amiodarone for rate control though will avoid bolus. Unlikely she will convert to sinus rhythm as she is most likely has permanent atrial fibrillation though will be cautious.  start  amio gtt  Hx of hypertension Hypotension BP: 90/49 PTA medications on hold [amlodipine  5 mg, bisoprolol  5 mg, diuresis medications as above] Multifactorial as she is admitted with infection, AF RVR, and acute blood loss anemia receiving blood products. Per primary  AKI on CKD Cr 2.97 -> 3.58 Baseline appears ~2.1-2.2 She is also noted to be hypermagnesemia and hyperphosphoremia. May benefit from nephrology consult.   CAD PAD with claudication Severe carotid artery stenosis-asymptomatic Patient not on antiplatelet 2/2 chronic anticoagulation Patient has been seen by [facility], has not undergone TCAR 2/2 to patient not showing [facility] to [facility] for risk stratification based on chart review.   Hyperlipidemia Restart Lipitor 80 mg  Restart Zetia  10 mg  Per primary GI bleed with acute blood loss anemia COPD exacerbation GERD Moderate protein malnutrition Tobacco use Decubitus ulcer/heel wounds   Risk Assessment/Risk Scores:       [place]  Heart Association (NYHA) Functional Class Unable to assess with patient's mentation  CHA2DS2-VASc Score = 6   This indicates a 9.7% annual risk of stroke. The patient's score is based upon: CHF History: 1 HTN History: 1 Diabetes History: 1 Stroke History: 0 Vascular Disease History: 1 Age Score: 1 Gender Score: 1        For questions or updates, please contact [facility] Please consult [email or web address] for contact info under      Signed, Leontine LOISE Salen, PA-C  02/16/2024 9:55 AM

## 2024-02-17 DIAGNOSIS — N184 Chronic kidney disease, stage 4 (severe): Secondary | ICD-10-CM

## 2024-02-17 DIAGNOSIS — I5033 Acute on chronic diastolic (congestive) heart failure: Secondary | ICD-10-CM | POA: Diagnosis not present

## 2024-02-17 DIAGNOSIS — N179 Acute kidney failure, unspecified: Secondary | ICD-10-CM | POA: Diagnosis not present

## 2024-02-17 DIAGNOSIS — Z7189 Other specified counseling: Secondary | ICD-10-CM | POA: Diagnosis not present

## 2024-02-17 DIAGNOSIS — K922 Gastrointestinal hemorrhage, unspecified: Secondary | ICD-10-CM | POA: Diagnosis not present

## 2024-02-17 DIAGNOSIS — Z515 Encounter for palliative care: Secondary | ICD-10-CM

## 2024-02-17 DIAGNOSIS — A419 Sepsis, unspecified organism: Secondary | ICD-10-CM | POA: Diagnosis not present

## 2024-02-17 DIAGNOSIS — E440 Moderate protein-calorie malnutrition: Secondary | ICD-10-CM

## 2024-02-17 DIAGNOSIS — I4819 Other persistent atrial fibrillation: Secondary | ICD-10-CM

## 2024-02-17 DIAGNOSIS — K5900 Constipation, unspecified: Secondary | ICD-10-CM | POA: Diagnosis not present

## 2024-02-17 DIAGNOSIS — I739 Peripheral vascular disease, unspecified: Secondary | ICD-10-CM

## 2024-02-17 DIAGNOSIS — I4891 Unspecified atrial fibrillation: Secondary | ICD-10-CM | POA: Diagnosis not present

## 2024-02-17 DIAGNOSIS — R579 Shock, unspecified: Secondary | ICD-10-CM | POA: Diagnosis not present

## 2024-02-17 LAB — COMPREHENSIVE METABOLIC PANEL WITH GFR
ALT: 17 U/L (ref 0–44)
AST: 44 U/L — ABNORMAL HIGH (ref 15–41)
Albumin: 3 g/dL — ABNORMAL LOW (ref 3.5–5.0)
Alkaline Phosphatase: 122 U/L (ref 38–126)
Anion gap: 27 — ABNORMAL HIGH (ref 5–15)
BUN: 125 mg/dL — ABNORMAL HIGH (ref 8–23)
CO2: 16 mmol/L — ABNORMAL LOW (ref 22–32)
Calcium: 9.2 mg/dL (ref 8.9–10.3)
Chloride: 91 mmol/L — ABNORMAL LOW (ref 98–111)
Creatinine, Ser: 4.15 mg/dL — ABNORMAL HIGH (ref 0.44–1.00)
GFR, Estimated: 11 mL/min — ABNORMAL LOW (ref >60)
Glucose, Bld: 265 mg/dL — ABNORMAL HIGH (ref 70–99)
Potassium: 4 mmol/L (ref 3.5–5.1)
Sodium: 134 mmol/L — ABNORMAL LOW (ref 135–145)
Total Bilirubin: 0.9 mg/dL (ref 0.0–1.2)
Total Protein: 5.4 g/dL — ABNORMAL LOW (ref 6.5–8.1)

## 2024-02-17 LAB — BASIC METABOLIC PANEL WITH GFR
Anion gap: 26 — ABNORMAL HIGH (ref 5–15)
BUN: 137 mg/dL — ABNORMAL HIGH (ref 8–23)
CO2: 18 mmol/L — ABNORMAL LOW (ref 22–32)
Calcium: 9 mg/dL (ref 8.9–10.3)
Chloride: 92 mmol/L — ABNORMAL LOW (ref 98–111)
Creatinine, Ser: 4.61 mg/dL — ABNORMAL HIGH (ref 0.44–1.00)
GFR, Estimated: 9 mL/min — ABNORMAL LOW (ref >60)
Glucose, Bld: 338 mg/dL — ABNORMAL HIGH (ref 70–99)
Potassium: 4 mmol/L (ref 3.5–5.1)
Sodium: 136 mmol/L (ref 135–145)

## 2024-02-17 LAB — CBC WITH DIFFERENTIAL/PLATELET
Abs Immature Granulocytes: 0.42 K/uL — ABNORMAL HIGH (ref 0.00–0.07)
Basophils Absolute: 0.1 K/uL (ref 0.0–0.1)
Basophils Relative: 0 %
Eosinophils Absolute: 0 K/uL (ref 0.0–0.5)
Eosinophils Relative: 0 %
HCT: 22.5 % — ABNORMAL LOW (ref 36.0–46.0)
Hemoglobin: 7 g/dL — ABNORMAL LOW (ref 12.0–15.0)
Immature Granulocytes: 2 %
Lymphocytes Relative: 4 %
Lymphs Abs: 1.2 K/uL (ref 0.7–4.0)
MCH: 27.6 pg (ref 26.0–34.0)
MCHC: 31.1 g/dL (ref 30.0–36.0)
MCV: 88.6 fL (ref 80.0–100.0)
Monocytes Absolute: 2.5 K/uL — ABNORMAL HIGH (ref 0.1–1.0)
Monocytes Relative: 9 %
Neutro Abs: 22.6 K/uL — ABNORMAL HIGH (ref 1.7–7.7)
Neutrophils Relative %: 85 %
Platelets: 195 K/uL (ref 150–400)
RBC: 2.54 MIL/uL — ABNORMAL LOW (ref 3.87–5.11)
RDW: 19.4 % — ABNORMAL HIGH (ref 11.5–15.5)
WBC: 26.7 K/uL — ABNORMAL HIGH (ref 4.0–10.5)
nRBC: 0.1 % (ref 0.0–0.2)

## 2024-02-17 LAB — GLUCOSE, CAPILLARY
Glucose-Capillary: 289 mg/dL — ABNORMAL HIGH (ref 70–99)
Glucose-Capillary: 294 mg/dL — ABNORMAL HIGH (ref 70–99)
Glucose-Capillary: 301 mg/dL — ABNORMAL HIGH (ref 70–99)
Glucose-Capillary: 312 mg/dL — ABNORMAL HIGH (ref 70–99)
Glucose-Capillary: 315 mg/dL — ABNORMAL HIGH (ref 70–99)

## 2024-02-17 LAB — STREP PNEUMONIAE URINARY ANTIGEN: Strep Pneumo Urinary Antigen: NEGATIVE

## 2024-02-17 LAB — MAGNESIUM: Magnesium: 3.1 mg/dL — ABNORMAL HIGH (ref 1.7–2.4)

## 2024-02-17 LAB — PHOSPHORUS: Phosphorus: 5.8 mg/dL — ABNORMAL HIGH (ref 2.5–4.6)

## 2024-02-17 MED ORDER — INSULIN ASPART 100 UNIT/ML IJ SOLN
0.0000 [IU] | INTRAMUSCULAR | Status: DC
  Administered 2024-02-17 (×2): 15 [IU] via SUBCUTANEOUS
  Administered 2024-02-18: 4 [IU] via SUBCUTANEOUS
  Administered 2024-02-18: 3 [IU] via SUBCUTANEOUS
  Administered 2024-02-18: 4 [IU] via SUBCUTANEOUS
  Administered 2024-02-18: 3 [IU] via SUBCUTANEOUS
  Administered 2024-02-18 (×2): 4 [IU] via SUBCUTANEOUS
  Administered 2024-02-18: 11 [IU] via SUBCUTANEOUS
  Administered 2024-02-19: 4 [IU] via SUBCUTANEOUS

## 2024-02-17 MED ORDER — INSULIN ASPART 100 UNIT/ML IJ SOLN: 0.0000 [IU] | Freq: Three times a day (TID) | INTRAMUSCULAR | Status: DC

## 2024-02-17 MED ORDER — FUROSEMIDE 10 MG/ML IJ SOLN
120.0000 mg | Freq: Once | INTRAVENOUS | Status: AC
  Administered 2024-02-17: 120 mg via INTRAVENOUS
  Filled 2024-02-17: qty 10

## 2024-02-17 MED ORDER — INSULIN GLARGINE-YFGN 100 UNIT/ML ~~LOC~~ SOLN
10.0000 [IU] | Freq: Every day | SUBCUTANEOUS | Status: DC
Start: 2024-02-17 — End: 2024-02-19
  Administered 2024-02-17 – 2024-02-18 (×2): 10 [IU] via SUBCUTANEOUS
  Filled 2024-02-17 (×3): qty 0.1

## 2024-02-17 MED ORDER — METHYLNALTREXONE BROMIDE 12 MG/0.6ML ~~LOC~~ SOLN
12.0000 mg | Freq: Once | SUBCUTANEOUS | Status: AC
  Administered 2024-02-17: 12 mg via SUBCUTANEOUS
  Filled 2024-02-17: qty 0.6

## 2024-02-17 NOTE — Progress Notes (Signed)
 OT Cancellation Note  Patient Details Name: Paula Wheeler MRN: [id number] DOB: 15-Jan-1951   Cancelled Treatment:    Reason Eval/Treat Not Completed: Fatigue/lethargy limiting ability to participate  S per nursing, patient lethargic this day and requesting to hold OT evaluation. Will continue to follow as soon as patient able to participate.   Jerra Huckeby OT/L Acute Rehabilitation Department  [phone]   02/17/2024, 8:03 AM

## 2024-02-17 NOTE — Consult Note (Signed)
 Consultation Note Date: 02/17/2024   Patient Name: Paula Wheeler  DOB: 06-01-1950  MRN: [id number]  Age / Sex: 73 y.o., female   PCP: Okey Carlin Redbird, MD Referring Physician: Annella Donnice SAUNDERS, MD  Reason for Consultation: Establishing goals of care     Chief Complaint/History of Present Illness:   Patient is 73 year old female with past medical history of atrial fibrillation on Eliquis , tobacco dependence, reported COPD but with no fixed obstruction on spirometry, CKD stage IV, type 2 diabetes mellitus, history of GI bleed, and multiple lower extremity wounds who was admitted on 02/15/2024 from rehab facility for management of acute mental status change.  Patient had recently been hospitalized from 9/27-10/9 for management of sepsis secondary to cellulitis and had been discharged to rehab after that hospitalization.  Since admission, patient has received management for undifferentiated shock presumed to be secondary to sepsis versus cardiogenic, A-fib with RVR severe constipation and AKI on CKD.  Cardiology and GI consulted for recommendations.  Palliative medicine team consulted to assist with complex medical decision making.  Extensive review of EMR including recent documentation from PCCM, PT/OT, cardiology, and GI.  Discussed care with bedside RN for medical updates.  Personally reviewed recent CMP noting BUN elevated to 125, creatinine elevated to 4.15, and GFR estimated at 11.  Patient's albumin noted to be low at 3.  Review of patient's recent CBC noting WBC downtrending to 26.7.  Personally reviewed recent CT abdomen pelvis noting constipation burden.  Discussed care with PCCM provider to coordinate care planning.  Presented to bedside to see patient.  Patient's brother, [name], present at bedside.  Patient very lethargic and not able to engage in complex discussion.  Introduced myself to [employer] as a member of the palliative medicine team and my role in patient's medical journey.   Inquired about any documentation patient previously had about ACP planning.  That he is patient's HCPOA and his brother, Lynwood, is patient's primary alternate.  Discussed that there are 5 brothers in addition to the patient so they all 12 to coordinate care.   Able to inquire about medical updates from [place].  Reviewed patient's medical journey up into this point with concerns related to acute on chronic heart failure and A-fib with RVR.  Also discussed concerns related to patient's worsening kidney function in the setting of chronic kidney disease.  Discussed patient with multiple concerning underlying medical conditions and their progression. Tried to inquire about patient's quality of life outside of the room.  Arland noted that patient is normally an independent person.  She is not married and lives at home alone.  Daughter notes that patient was still working remotely up into this point.  He noted that patient has become more sedentary which has affected her quality of life.  Spent time discussing pathways for medical care moving forward.  At this time continuing appropriate medical interventions while allowing time to determine what patient's body is going to do in terms of renal function particularly.  Did express concerns about worsening medical status and what kind of medical care patient would want.  Arland noted that patient does have ACP documentation so he will work to bring this in for review.  Discussed CODE STATUS and daughter-in-law noted that he and his brothers all agree that they would not want patient to undergo cardiac resuscitation or intubation mechanical ventilation with all her underlying medical conditions.  Acknowledged this.  Discussed can review ACP documentation and potentially appropriately change CODE STATUS based on patient's  wishes and medical appropriateness.  At this time continuing appropriate medical interventions.  Spent time answering questions as able noted palliative  medicine team to continue to follow patient's medical journey.  Discussed care with PCCM and RN after visit to coordinate care.  Primary Diagnoses  Present on Admission:  GI bleed  Prolonged QT interval  Acute on chronic blood loss anemia  Chronic diastolic CHF (congestive heart failure) (HCC)  COPD with acute exacerbation (HCC)  Essential hypertension  GERD (gastroesophageal reflux disease)  Elevated troponin  HLD (hyperlipidemia)  Persistent atrial fibrillation  PAD (peripheral artery disease)  Acute renal failure superimposed on stage 4 chronic kidney disease (HCC)  Moderate protein malnutrition  Carotid artery disease  Tobacco abuse   Past Medical History:  Diagnosis Date   Anemia    COPD (chronic obstructive pulmonary disease) (HCC)    Dyslipidemia    Exogenous obesity    GERD (gastroesophageal reflux disease)    GI bleed 05/14/2016   History of abnormal cells from cervix    Hyperlipidemia    Hypertension    Iron  deficiency anemia    Longstanding persistent atrial fibrillation (HCC)    Microalbuminuria    Tobacco dependence    Type II diabetes mellitus (HCC)    Social History   Socioeconomic History   Marital status: Single    Spouse name: Not on file   Number of children: Not on file   Years of education: Not on file   Highest education level: Not on file  Occupational History   Not on file  Tobacco Use   Smoking status: Every Day    Current packs/day: 0.50    Average packs/day: 0.5 packs/day for 40.0 years (20.0 ttl pk-yrs)    Types: Cigarettes    Passive exposure: Never   Smokeless tobacco: Never   Tobacco comments:    Still smoking 8-10 cigarettes a day. 10/25/2022 Tay  Vaping Use   Vaping status: Never Used  Substance and Sexual Activity   Alcohol use: Not Currently    Comment: 05/14/2016 I'll have a drink once/year; New Year's   Drug use: No   Sexual activity: Never  Other Topics Concern   Not on file  Social History Narrative   Not on  file   Social Drivers of Health   Financial Resource Strain: Not on file  Food Insecurity: Patient Unable To Answer (02/15/2024)   Hunger Vital Sign    Worried About Running Out of Food in the Last Year: Patient unable to answer    Ran Out of Food in the Last Year: Patient unable to answer  Transportation Needs: Patient Unable To Answer (02/15/2024)   PRAPARE - Transportation    Lack of Transportation (Medical): Patient unable to answer    Lack of Transportation (Non-Medical): Patient unable to answer  Physical Activity: Not on file  Stress: Not on file  Social Connections: Patient Unable To Answer (02/15/2024)   Social Connection and Isolation Panel    Frequency of Communication with Friends and Family: Patient unable to answer    Frequency of Social Gatherings with Friends and Family: Patient unable to answer    Attends Religious Services: Patient unable to answer    Active Member of Clubs or Organizations: Patient unable to answer    Attends Banker Meetings: Patient unable to answer    Marital Status: Patient unable to answer  Recent Concern: Social Connections - Socially Isolated (01/24/2024)   Social Connection and Isolation Panel    Frequency  of Communication with Friends and Family: More than three times a week    Frequency of Social Gatherings with Friends and Family: More than three times a week    Attends Religious Services: Never    Database administrator or Organizations: No    Attends Engineer, structural: Never    Marital Status: Never married   Family History  Problem Relation Age of Onset   Cancer Mother        BREAST   Hypertension Mother    Macular degeneration Mother    Heart attack Father    Diabetes Father    Colon polyps Brother    Colon polyps Brother    Colon polyps Brother    Colon polyps Brother    Colon polyps Brother    Colon polyps Brother    Scheduled Meds:  Chlorhexidine  Gluconate Cloth  6 each Topical Daily    collagenase   Topical Daily   Influenza vac split trivalent PF  0.5 mL Intramuscular Tomorrow-1000   insulin  aspart  0-9 Units Subcutaneous TID WC   mouth rinse  15 mL Mouth Rinse 4 times per day   pantoprazole  (PROTONIX ) IV  40 mg Intravenous Q12H   Continuous Infusions:  sodium chloride      amiodarone 30 mg/hr (02/17/24 0628)   ceFEPime  (MAXIPIME ) IV Stopped (02/16/24 1454)   furosemide      metronidazole 500 mg (02/17/24 0927)   norepinephrine  (LEVOPHED ) Adult infusion Stopped (02/16/24 2135)   PRN Meds:.acetaminophen  **OR** acetaminophen , HYDROmorphone  (DILAUDID ) injection, levalbuterol , ondansetron  **OR** ondansetron  (ZOFRAN ) IV, mouth rinse Allergies  Allergen Reactions   Nsaids Swelling    Fluid retention and extremity swelling (hands, legs)   Penicillins Hives   Accupril  [Quinapril  Hcl] Other (See Comments)    Elevated potassium and creatinine    CBC:    Component Value Date/Time   WBC 26.7 (H) 02/17/2024 0322   HGB 7.0 (L) 02/17/2024 0322   HGB 10.4 (L) 03/31/2017 0846   HCT 22.5 (L) 02/17/2024 0322   HCT 32.6 (L) 03/31/2017 0846   PLT 195 02/17/2024 0322   PLT 355 03/31/2017 0846   MCV 88.6 02/17/2024 0322   MCV 88 03/31/2017 0846   NEUTROABS 22.6 (H) 02/17/2024 0322   NEUTROABS 6.1 08/20/2016 0815   LYMPHSABS 1.2 02/17/2024 0322   LYMPHSABS 1.8 08/20/2016 0815   MONOABS 2.5 (H) 02/17/2024 0322   EOSABS 0.0 02/17/2024 0322   EOSABS 0.2 08/20/2016 0815   BASOSABS 0.1 02/17/2024 0322   BASOSABS 0.0 08/20/2016 0815   Comprehensive Metabolic Panel:    Component Value Date/Time   NA 134 (L) 02/17/2024 0322   NA 143 07/31/2021 0837   K 4.0 02/17/2024 0322   CL 91 (L) 02/17/2024 0322   CO2 16 (L) 02/17/2024 0322   BUN 125 (H) 02/17/2024 0322   BUN 27 07/31/2021 0837   CREATININE 4.15 (H) 02/17/2024 0322   CREATININE 1.41 (H) 05/13/2016 0825   GLUCOSE 265 (H) 02/17/2024 0322   CALCIUM  9.2 02/17/2024 0322   AST 44 (H) 02/17/2024 0322   ALT 17 02/17/2024  0322   ALKPHOS 122 02/17/2024 0322   BILITOT 0.9 02/17/2024 0322   BILITOT 0.4 05/11/2019 0837   PROT 5.4 (L) 02/17/2024 0322   PROT 6.3 05/11/2019 0837   ALBUMIN 3.0 (L) 02/17/2024 0322   ALBUMIN 4.2 05/11/2019 0837    Physical Exam: Vital Signs: BP (!) 135/40   Pulse 99   Temp 99.5 F (37.5 C) (Axillary)   Resp 17  Ht 5' 6 (1.676 m)   Wt 78.8 kg   SpO2 96%   BMI 28.04 kg/m  SpO2: SpO2: 96 % O2 Device: O2 Device: Nasal Cannula O2 Flow Rate: O2 Flow Rate (L/min): 2 L/min Intake/output summary:  Intake/Output Summary (Last 24 hours) at 02/17/2024 1019 Last data filed at 02/17/2024 9371 Gross per 24 hour  Intake 1109.05 ml  Output 125 ml  Net 984.05 ml   LBM: Last BM Date : 02/16/24 Baseline Weight: Weight: 86 kg Most recent weight: Weight: 78.8 kg  General: Lethargic, frail, chronically ill-appearing Cardiovascular: Tachycardia noted Respiratory: no increased work of breathing noted, not in respiratory distress Abdomen: not distended, not tender but to palpation Skin: Wounds present on lower extremities bilaterally Neuro: lethargic  Palliative Performance Scale: 10%              Additional Data Reviewed: Recent Labs    02/16/24 0257 02/16/24 1307 02/16/24 1526 02/16/24 1528 02/17/24 0322  WBC 27.8*  --  28.5*  --  26.7*  HGB 7.3*   < > 7.0*  --  7.0*  PLT 237  --  198 197 195  NA 136  --   --   --  134*  BUN 118*  --   --   --  125*  CREATININE 3.58*  --   --   --  4.15*   < > = values in this interval not displayed.    Imaging: ECHOCARDIOGRAM LIMITED    ECHOCARDIOGRAM LIMITED REPORT       Patient Name:   MORENE CECILIO Date of Exam: 02/16/2024 Medical Rec #:  [id number]         Height:       66.0 in Accession #:    [id number]        Weight:       189.6 lb Date of Birth:  06-May-1950         BSA:          1.955 m Patient Age:    73 years          BP:           113/50 mmHg Patient Gender: F                 HR:           93 bpm. Exam Location:   Inpatient  Procedure: Limited Echo, Cardiac Doppler and Color Doppler (Both Spectral and            Color Flow Doppler were utilized during procedure).  Indications:    CHF   History:        Patient has prior history of Echocardiogram examinations.   Sonographer:    Norleen Amour Referring Phys: [id number] LOGAN N LOCKWOOD  IMPRESSIONS   1. Left ventricular ejection fraction, by estimation, is 55 to 60%. The left ventricle has normal function. There is mild left ventricular hypertrophy. Left ventricular diastolic parameters are indeterminate.  2. Right ventricular systolic function is moderately reduced. The right ventricular size is normal. There is moderately elevated pulmonary artery systolic pressure. The estimated right ventricular systolic pressure is 46.8 mmHg.  3. Left atrial size was mildly dilated.  4. Right atrial size was mildly dilated.  5. The mitral valve is grossly normal. Trivial mitral valve regurgitation.  6. The aortic valve is tricuspid. There is mild calcification of the aortic valve. Aortic valve regurgitation is trivial. Aortic valve sclerosis/calcification is present, without any evidence of aortic  stenosis.  7. The inferior vena cava is dilated in size with <50% respiratory variability, suggesting right atrial pressure of 15 mmHg.  FINDINGS  Left Ventricle: Left ventricular ejection fraction, by estimation, is 55 to 60%. The left ventricle has normal function. There is mild left ventricular hypertrophy. Left ventricular diastolic parameters are indeterminate.  Right Ventricle: The right ventricular size is normal. Right ventricular systolic function is moderately reduced. There is moderately elevated pulmonary artery systolic pressure. The tricuspid regurgitant velocity is 2.82 m/s, and with an assumed right  atrial pressure of 15 mmHg, the estimated right ventricular systolic pressure is 46.8 mmHg.  Left Atrium: Left atrial size was mildly dilated.  Right  Atrium: Right atrial size was mildly dilated.  Mitral Valve: The mitral valve is grossly normal. Trivial mitral valve regurgitation.  Tricuspid Valve: The tricuspid valve is grossly normal. Tricuspid valve regurgitation is mild.  Aortic Valve: The aortic valve is tricuspid. There is mild calcification of the aortic valve. Aortic valve regurgitation is trivial. Aortic valve sclerosis/calcification is present, without any evidence of aortic stenosis. Aortic valve mean gradient  measures 6.5 mmHg. Aortic valve peak gradient measures 12.2 mmHg. Aortic valve area, by VTI measures 2.27 cm.  Venous: The inferior vena cava is dilated in size with less than 50% respiratory variability, suggesting right atrial pressure of 15 mmHg.  IAS/Shunts: No atrial level shunt detected by color flow Doppler.  LEFT VENTRICLE PLAX 2D LVIDd:         4.90 cm      Diastology LVIDs:         3.40 cm      LV e' medial:    8.59 cm/s LV PW:         1.10 cm      LV E/e' medial:  16.1 LV IVS:        1.10 cm      LV e' lateral:   10.30 cm/s LVOT diam:     2.10 cm      LV E/e' lateral: 13.4 LV SV:         67 LV SV Index:   34 LVOT Area:     3.46 cm   LV Volumes (MOD) LV vol d, MOD A2C: 97.8 ml LV vol d, MOD A4C: 106.0 ml LV vol s, MOD A2C: 41.0 ml LV vol s, MOD A4C: 31.4 ml LV SV MOD A2C:     56.8 ml LV SV MOD A4C:     106.0 ml LV SV MOD BP:      70.0 ml  IVC IVC diam: 2.50 cm  LEFT ATRIUM           Index LA diam:      4.40 cm 2.25 cm/m LA Vol (A4C): 72.5 ml 37.09 ml/m  AORTIC VALVE AV Area (Vmax):    2.56 cm AV Area (Vmean):   2.21 cm AV Area (VTI):     2.27 cm AV Vmax:           175.00 cm/s AV Vmean:          118.500 cm/s AV VTI:            0.296 m AV Peak Grad:      12.2 mmHg AV Mean Grad:      6.5 mmHg LVOT Vmax:         129.22 cm/s LVOT Vmean:        75.749 cm/s LVOT VTI:          0.194  m LVOT/AV VTI ratio: 0.65   AORTA Ao Root diam: 3.00 cm  MITRAL VALVE                TRICUSPID  VALVE MV Area (PHT): 3.65 cm     TR Peak grad:   31.8 mmHg MV Decel Time: 208 msec     TR Vmax:        282.00 cm/s MV E velocity: 138.00 cm/s                             SHUNTS                             Systemic VTI:  0.19 m                             Systemic Diam: 2.10 cm  Soyla Merck MD Electronically signed by Soyla Merck MD Signature Date/Time: 02/16/2024/3:43:18 PM      Final   CT ABDOMEN PELVIS W CONTRAST CLINICAL DATA:  Sepsis.  EXAM: CT ABDOMEN AND PELVIS WITH CONTRAST  TECHNIQUE: Multidetector CT imaging of the abdomen and pelvis was performed using the standard protocol following bolus administration of intravenous contrast.  RADIATION DOSE REDUCTION: This exam was performed according to the departmental dose-optimization program which includes automated exposure control, adjustment of the mA and/or kV according to patient size and/or use of iterative reconstruction technique.  CONTRAST:  80mL OMNIPAQUE  IOHEXOL  300 MG/ML  SOLN  COMPARISON:  02/15/2024.  FINDINGS: Lower chest: Right lower lobe collapse/consolidation. Moderate right and small left pleural effusions. Compressive atelectasis in the left lower lobe. Subsegmental volume loss in the right middle lobe and lingula. Atherosclerotic calcification of the aorta, aortic valve and coronary arteries. Heart is enlarged. Small pericardial effusion. Fluid in the distal esophagus.  Hepatobiliary: Liver and gallbladder are unremarkable. No biliary ductal dilatation.  Pancreas: Negative.  Spleen: Negative.  Adrenals/Urinary Tract: Slight thickening of the adrenal glands, unchanged. No specific follow-up necessary. Renal vascular calcifications bilaterally. Subcentimeter low-attenuation lesions in the kidneys are too small to characterize. No specific follow-up necessary. Ureters are decompressed. Bladder is decompressed with a Foley catheter in place.  Stomach/Bowel: Stomach and small bowel  are unremarkable. Appendix is not readily visualized. Stool throughout the colon.  Vascular/Lymphatic: Atherosclerotic calcification of the aorta. No pathologically enlarged lymph nodes.  Reproductive: Uterus is visualized.  No adnexal mass.  Other: Presacral edema. No free fluid. Mesenteries and peritoneum are unremarkable.  Musculoskeletal: Sarcopenia. Osteopenia. Degenerative changes in the spine.  IMPRESSION: 1. Moderate right and small left pleural effusions. 2. Right lower lobe collapse/consolidation may be due to pneumonia. Small pericardial effusion. 3. Stool throughout the colon. 4. Aortic atherosclerosis (ICD10-I70.0). Coronary artery calcification.  Electronically Signed   By: Newell Eke M.D.   On: 02/16/2024 15:10    I personally reviewed recent imaging.   Palliative Care Assessment and Plan Summary of Established Goals of Care and Medical Treatment Preferences   Patient is 73 year old female with past medical history of atrial fibrillation on Eliquis , tobacco dependence, reported COPD but with no fixed obstruction on spirometry, CKD stage IV, type 2 diabetes mellitus, history of GI bleed, and multiple lower extremity wounds who was admitted on 02/15/2024 from rehab facility for management of acute mental status change.  Patient had recently been hospitalized from 9/27-10/9 for management of sepsis secondary to  cellulitis and had been discharged to rehab after that hospitalization.  Since admission, patient has received management for undifferentiated shock presumed to be secondary to sepsis versus cardiogenic, A-fib with RVR severe constipation and AKI on CKD.  Cardiology and GI consulted for recommendations.  Palliative medicine team consulted to assist with complex medical decision making.  # Complex medical decision making/goals of care  - Patient unable to participate in complex medical decision making due to underlying medical status.    - Discussed care  with patient's brother, [name], present at bedside.  Reviewed patient's current medical illnesses and concerns related to them.  Began discussions regarding pathways for medical care moving forward.  Currently monitoring to allow time for outcomes while continuing appropriate medical interventions.  [name] bring in ACP documentation for further review, and particularly related to CODE STATUS.  [name] did note that he and his brothers have already discussed and they would not want patient to suffer and go through aggressive medical interventions such as CPR. Awaiting ACP document review to discuss change of code status.  Palliative medicine team to continue conversations as able and appropriate moving forward.  -  Code Status: Full Code   # Symptom management Patient is receiving these palliative interventions for symptom management with an intent to improve quality of life.   - Constipation - Discussed care with PCCM providers to coordinate medication management  # Psycho-social/Spiritual Support:  - Support System: 5 brothers; [name] reported to be HCPOA and Lynwood is primary alternate; to bring in ACP documentation for review  # Discharge Planning:  To Be Determined  Thank you for allowing the palliative care team to participate in the care Almarie DELENA Ada.  Tinnie Radar, DO Palliative Care Provider PMT # [phone]  If patient remains symptomatic despite maximum doses, please call PMT at [phone] between 0700 and 1900. Outside of these hours, please call attending, as PMT does not have night coverage.  Billing based on MDM: High  Problems Addressed: One or more chronic illnesses with severe exacerbation, progression, or side effects of treatment.  Amount and/or Complexity of Data: Category 1:Review of prior external note(s) from each unique source, Review of the result(s) of each unique test, and Assessment requiring an independent historian(s), Category 2:Independent interpretation  of a test performed by another physician/other qualified health care professional (not separately reported), and Category 3:Discussion of management or test interpretation with external physician/other qualified health care professional/appropriate source (not separately reported)

## 2024-02-17 NOTE — Progress Notes (Signed)
 [facility] Progress Note  SUBJECTIVE:   Interval history: Paula Wheeler was seen and evaluated today at bedside. No family at bedside. Unable to obtain review of systems, patient moaning during exam, noted pain though not able to localize. Large brown bowel movement noted by nursing staff today at 1313. No emesis documented. CT imaging reviewed. Hgb stable, worsening AKI (received contrast for CT).   Past Medical History:  Diagnosis Date   Anemia    COPD (chronic obstructive pulmonary disease) (HCC)    Dyslipidemia    Exogenous obesity    GERD (gastroesophageal reflux disease)    GI bleed 05/14/2016   History of abnormal cells from cervix    Hyperlipidemia    Hypertension    Iron  deficiency anemia    Longstanding persistent atrial fibrillation (HCC)    Microalbuminuria    Tobacco dependence    Type II diabetes mellitus (HCC)    Past Surgical History:  Procedure Laterality Date   CARDIOVERSION N/A 08/07/2016   Procedure: CARDIOVERSION;  Surgeon: Aleene JINNY Passe, MD;  Location: [facility] ENDOSCOPY;  Service: Cardiovascular;  Laterality: N/A;   DIAGNOSTIC MAMMOGRAM  05/2015   ESOPHAGOGASTRODUODENOSCOPY N/A 05/15/2016   Procedure: ESOPHAGOGASTRODUODENOSCOPY (EGD);  Surgeon: Lesta JULIANNA Fitz, MD;  Location: [facility] ENDOSCOPY;  Service: Endoscopy;  Laterality: N/A;   ESOPHAGOGASTRODUODENOSCOPY N/A 01/26/2024   Procedure: EGD (ESOPHAGOGASTRODUODENOSCOPY);  Surgeon: Dianna Specking, MD;  Location: [facility];  Service: Gastroenterology;  Laterality: N/A;   EXPLORATORY LAPAROTOMY  1968   I think they left my appendix   PAP SMEAR  2012 AND 2017   Current Facility-Administered Medications  Medication Dose Route Frequency Provider Last Rate Last Admin   acetaminophen  (TYLENOL ) tablet 650 mg  650 mg Oral Q6H PRN Celinda Alm Lot, MD       Or   acetaminophen  (TYLENOL ) suppository 650 mg  650 mg Rectal Q6H PRN Celinda Alm Lot, MD       amiodarone (NEXTERONE PREMIX) 360-4.14  MG/200ML-% (1.8 mg/mL) IV infusion  30 mg/hr Intravenous Continuous Garrick Leontine SAILOR, PA-C 16.67 mL/hr at 02/17/24 1408 30 mg/hr at 02/17/24 1408   ceFEPIme  (MAXIPIME ) 2 g in sodium chloride  0.9 % 100 mL IVPB  2 g Intravenous Q24H Ilah Krabbe M, PA-C 200 mL/hr at 02/17/24 1408 Infusion Verify at 02/17/24 1408   Chlorhexidine  Gluconate Cloth 2 % PADS 6 each  6 each Topical Daily Celinda Alm Lot, MD   6 each at 02/17/24 1313   collagenase (SANTYL) ointment   Topical Daily Celinda Alm Lot, MD   Given at 02/17/24 1313   HYDROmorphone  (DILAUDID ) injection 0.5-1 mg  0.5-1 mg Intravenous Q2H PRN Ilah Krabbe M, PA-C   1 mg at 02/17/24 1216   Influenza vac split trivalent PF (FLUZONE HIGH-DOSE) injection 0.5 mL  0.5 mL Intramuscular Tomorrow-1000 Cheryle Page, MD       insulin  aspart (novoLOG ) injection 0-15 Units  0-15 Units Subcutaneous TID WC Ilah Krabbe M, PA-C       insulin  glargine-yfgn (SEMGLEE ) injection 10 Units  10 Units Subcutaneous Daily Ilah Krabbe M, PA-C       levalbuterol  (XOPENEX ) nebulizer solution 1.25 mg  1.25 mg Nebulization Q6H PRN Cheryle Page, MD       metroNIDAZOLE (FLAGYL) IVPB 500 mg  500 mg Intravenous BID Ilah Krabbe M, PA-C   Stopped at 02/17/24 1028   norepinephrine  (LEVOPHED ) 4mg  in (0.016 mg/mL) premix infusion  0-10 mcg/min Intravenous Titrated Ilah Krabbe HERO, PA-C   Stopped at 02/16/24 2135  ondansetron  (ZOFRAN ) tablet 4 mg  4 mg Oral Q6H PRN Celinda Alm Lot, MD       Or   ondansetron  (ZOFRAN ) injection 4 mg  4 mg Intravenous Q6H PRN Celinda Alm Lot, MD       Oral care mouth rinse  15 mL Mouth Rinse 4 times per day Celinda Alm Lot, MD   15 mL at 02/17/24 1158   Oral care mouth rinse  15 mL Mouth Rinse PRN Celinda Alm Lot, MD       pantoprazole  (PROTONIX ) injection 40 mg  40 mg Intravenous Q12H Celinda Alm Lot, MD   40 mg at 02/17/24 9076   Allergies as of 02/15/2024 - Review Complete 02/15/2024  Allergen  Reaction Noted   Nsaids Swelling 02/27/2016   Penicillins Hives 02/27/2016   Quinapril  hcl Other (See Comments) 06/20/2021   Review of Systems:  Review of Systems  Unable to perform ROS: Acuity of condition    OBJECTIVE:   Temp:  [98.6 F (37 C)-100.3 F (37.9 C)] 100.3 F (37.9 C) (10/21 1200) Pulse Rate:  [36-138] 101 (10/21 1304) Resp:  [10-27] 19 (10/21 1304) BP: (93-143)/(30-72) 98/55 (10/21 1304) SpO2:  [89 %-98 %] 89 % (10/21 1304) Weight:  [78.8 kg] 78.8 kg (10/21 0331) Last BM Date : 02/16/24 Physical Exam Constitutional:      Appearance: She is well-developed and normal weight. She is ill-appearing.  Cardiovascular:     Rate and Rhythm: Regular rhythm. Tachycardia present.  Pulmonary:     Effort: No respiratory distress.     Comments: Coarse R anterior breath sounds. Supplemental oxygen  nasal cannula. Abdominal:     General: Bowel sounds are normal. There is no distension.     Palpations: Abdomen is soft.     Tenderness: There is abdominal tenderness (diffuse).  Skin:    General: Skin is warm and dry.     Labs: Recent Labs    02/16/24 0257 02/16/24 1307 02/16/24 1526 02/16/24 1528 02/17/24 0322  WBC 27.8*  --  28.5*  --  26.7*  HGB 7.3* 7.4* 7.0*  --  7.0*  HCT 23.5* 23.8* 22.5*  --  22.5*  PLT 237  --  198 197 195   BMET Recent Labs    02/15/24 1313 02/16/24 0257 02/17/24 0322  NA 136 136 134*  K 3.7 4.1 4.0  CL 94* 92* 91*  CO2 19* 13* 16*  GLUCOSE 123* 209* 265*  BUN 105* 118* 125*  CREATININE 2.97* 3.58* 4.15*  CALCIUM  9.0 9.5 9.2   LFT Recent Labs    02/17/24 0322  PROT 5.4*  ALBUMIN 3.0*  AST 44*  ALT 17  ALKPHOS 122  BILITOT 0.9   PT/INR Recent Labs    02/16/24 1528  LABPROT 27.4*  INR 2.4*   Diagnostic imaging: ECHOCARDIOGRAM LIMITED Result Date: 02/16/2024    ECHOCARDIOGRAM LIMITED REPORT   Patient Name:   Paula Wheeler Date of Exam: 02/16/2024 Medical Rec #:  [id number]         Height:       66.0 in Accession  #:    [id number]        Weight:       189.6 lb Date of Birth:  07-22-1950         BSA:          1.955 m Patient Age:    73 years          BP:  113/50 mmHg Patient Gender: F                 HR:           93 bpm. Exam Location:  Inpatient Procedure: Limited Echo, Cardiac Doppler and Color Doppler (Both Spectral and            Color Flow Doppler were utilized during procedure). Indications:    CHF  History:        Patient has prior history of Echocardiogram examinations.  Sonographer:    Norleen Amour Referring Phys: [id number] LOGAN N LOCKWOOD IMPRESSIONS  1. Left ventricular ejection fraction, by estimation, is 55 to 60%. The left ventricle has normal function. There is mild left ventricular hypertrophy. Left ventricular diastolic parameters are indeterminate.  2. Right ventricular systolic function is moderately reduced. The right ventricular size is normal. There is moderately elevated pulmonary artery systolic pressure. The estimated right ventricular systolic pressure is 46.8 mmHg.  3. Left atrial size was mildly dilated.  4. Right atrial size was mildly dilated.  5. The mitral valve is grossly normal. Trivial mitral valve regurgitation.  6. The aortic valve is tricuspid. There is mild calcification of the aortic valve. Aortic valve regurgitation is trivial. Aortic valve sclerosis/calcification is present, without any evidence of aortic stenosis.  7. The inferior vena cava is dilated in size with <50% respiratory variability, suggesting right atrial pressure of 15 mmHg. FINDINGS  Left Ventricle: Left ventricular ejection fraction, by estimation, is 55 to 60%. The left ventricle has normal function. There is mild left ventricular hypertrophy. Left ventricular diastolic parameters are indeterminate. Right Ventricle: The right ventricular size is normal. Right ventricular systolic function is moderately reduced. There is moderately elevated pulmonary artery systolic pressure. The tricuspid regurgitant velocity  is 2.82 m/s, and with an assumed right atrial pressure of 15 mmHg, the estimated right ventricular systolic pressure is 46.8 mmHg. Left Atrium: Left atrial size was mildly dilated. Right Atrium: Right atrial size was mildly dilated. Mitral Valve: The mitral valve is grossly normal. Trivial mitral valve regurgitation. Tricuspid Valve: The tricuspid valve is grossly normal. Tricuspid valve regurgitation is mild. Aortic Valve: The aortic valve is tricuspid. There is mild calcification of the aortic valve. Aortic valve regurgitation is trivial. Aortic valve sclerosis/calcification is present, without any evidence of aortic stenosis. Aortic valve mean gradient measures 6.5 mmHg. Aortic valve peak gradient measures 12.2 mmHg. Aortic valve area, by VTI measures 2.27 cm. Venous: The inferior vena cava is dilated in size with less than 50% respiratory variability, suggesting right atrial pressure of 15 mmHg. IAS/Shunts: No atrial level shunt detected by color flow Doppler. LEFT VENTRICLE PLAX 2D LVIDd:         4.90 cm      Diastology LVIDs:         3.40 cm      LV e' medial:    8.59 cm/s LV PW:         1.10 cm      LV E/e' medial:  16.1 LV IVS:        1.10 cm      LV e' lateral:   10.30 cm/s LVOT diam:     2.10 cm      LV E/e' lateral: 13.4 LV SV:         67 LV SV Index:   34 LVOT Area:     3.46 cm  LV Volumes (MOD) LV vol d, MOD A2C: 97.8 ml LV vol d, MOD A4C:  106.0 ml LV vol s, MOD A2C: 41.0 ml LV vol s, MOD A4C: 31.4 ml LV SV MOD A2C:     56.8 ml LV SV MOD A4C:     106.0 ml LV SV MOD BP:      70.0 ml IVC IVC diam: 2.50 cm LEFT ATRIUM           Index LA diam:      4.40 cm 2.25 cm/m LA Vol (A4C): 72.5 ml 37.09 ml/m  AORTIC VALVE AV Area (Vmax):    2.56 cm AV Area (Vmean):   2.21 cm AV Area (VTI):     2.27 cm AV Vmax:           175.00 cm/s AV Vmean:          118.500 cm/s AV VTI:            0.296 m AV Peak Grad:      12.2 mmHg AV Mean Grad:      6.5 mmHg LVOT Vmax:         129.22 cm/s LVOT Vmean:        75.749 cm/s  LVOT VTI:          0.194 m LVOT/AV VTI ratio: 0.65  AORTA Ao Root diam: 3.00 cm MITRAL VALVE                TRICUSPID VALVE MV Area (PHT): 3.65 cm     TR Peak grad:   31.8 mmHg MV Decel Time: 208 msec     TR Vmax:        282.00 cm/s MV E velocity: 138.00 cm/s                             SHUNTS                             Systemic VTI:  0.19 m                             Systemic Diam: 2.10 cm Soyla Merck MD Electronically signed by Soyla Merck MD Signature Date/Time: 02/16/2024/3:43:18 PM    Final    CT ABDOMEN PELVIS W CONTRAST Result Date: 02/16/2024 CLINICAL DATA:  Sepsis. EXAM: CT ABDOMEN AND PELVIS WITH CONTRAST TECHNIQUE: Multidetector CT imaging of the abdomen and pelvis was performed using the standard protocol following bolus administration of intravenous contrast. RADIATION DOSE REDUCTION: This exam was performed according to the departmental dose-optimization program which includes automated exposure control, adjustment of the mA and/or kV according to patient size and/or use of iterative reconstruction technique. CONTRAST:  80mL OMNIPAQUE  IOHEXOL  300 MG/ML  SOLN COMPARISON:  02/15/2024. FINDINGS: Lower chest: Right lower lobe collapse/consolidation. Moderate right and small left pleural effusions. Compressive atelectasis in the left lower lobe. Subsegmental volume loss in the right middle lobe and lingula. Atherosclerotic calcification of the aorta, aortic valve and coronary arteries. Heart is enlarged. Small pericardial effusion. Fluid in the distal esophagus. Hepatobiliary: Liver and gallbladder are unremarkable. No biliary ductal dilatation. Pancreas: Negative. Spleen: Negative. Adrenals/Urinary Tract: Slight thickening of the adrenal glands, unchanged. No specific follow-up necessary. Renal vascular calcifications bilaterally. Subcentimeter low-attenuation lesions in the kidneys are too small to characterize. No specific follow-up necessary. Ureters are decompressed. Bladder is  decompressed with a Foley catheter in place. Stomach/Bowel: Stomach and small bowel are unremarkable. Appendix is  not readily visualized. Stool throughout the colon. Vascular/Lymphatic: Atherosclerotic calcification of the aorta. No pathologically enlarged lymph nodes. Reproductive: Uterus is visualized.  No adnexal mass. Other: Presacral edema. No free fluid. Mesenteries and peritoneum are unremarkable. Musculoskeletal: Sarcopenia. Osteopenia. Degenerative changes in the spine. IMPRESSION: 1. Moderate right and small left pleural effusions. 2. Right lower lobe collapse/consolidation may be due to pneumonia. Small pericardial effusion. 3. Stool throughout the colon. 4. Aortic atherosclerosis (ICD10-I70.0). Coronary artery calcification. Electronically Signed   By: Newell Eke M.D.   On: 02/16/2024 15:10   CT Renal Stone Study Result Date: 02/15/2024 CLINICAL DATA:  Abdominal/flank pain, stone suspected. EXAM: CT ABDOMEN AND PELVIS WITHOUT CONTRAST TECHNIQUE: Multidetector CT imaging of the abdomen and pelvis was performed following the standard protocol without IV contrast. RADIATION DOSE REDUCTION: This exam was performed according to the departmental dose-optimization program which includes automated exposure control, adjustment of the mA and/or kV according to patient size and/or use of iterative reconstruction technique. COMPARISON:  09/27/2023. FINDINGS: Lower chest: Multi-vessel coronary artery calcifications are noted. The heart is enlarged and there is a small pericardial effusion. There are small bilateral pleural effusions with atelectasis bilaterally. Patchy consolidation is noted in the right lower lobe. Hepatobiliary: No focal liver abnormality is seen. No gallstones, gallbladder wall thickening, or biliary dilatation. Pancreas: Unremarkable. No pancreatic ductal dilatation or surrounding inflammatory changes. Spleen: Normal in size without focal abnormality. Adrenals/Urinary Tract: The adrenal  glands appear stable. There is right renal atrophy. A stable exophytic lesion is noted in the lower pole the left kidney. Predominantly vascular calcifications are seen in the kidneys bilaterally, however the possibility of small renal calculus cannot be excluded. No ureteral calculus or obstructive uropathy bilaterally. A Foley catheter is present in a decompressed urinary bladder. Stomach/Bowel: The stomach is distended with ingested debris. No bowel obstruction, free air, or pneumatosis is seen. A moderate amount of retained stool is present in the colon. Appendix is not seen. Vascular/Lymphatic: Aortic atherosclerosis. No enlarged abdominal or pelvic lymph nodes. Reproductive: Uterus and bilateral adnexa are unremarkable. Other: No abdominopelvic ascites.  Moderate anasarca is noted. Musculoskeletal: Degenerative changes are present in the thoracolumbar spine. No acute osseous abnormality is seen. IMPRESSION: 1. Patchy right lower lobe consolidation, suspicious for pneumonia. 2. Small bilateral pleural effusions. 3. Vascular calcifications in the kidneys bilaterally with possible small renal calculi. No obstructive uropathy is seen. 4. Moderate amount of retained stool in the colon. 5. Cardiomegaly with small pericardial effusion and coronary artery calcifications. 6. Aortic atherosclerosis. Electronically Signed   By: Leita Birmingham M.D.   On: 02/15/2024 14:44   CT Head Wo Contrast Result Date: 02/15/2024 CLINICAL DATA:  Altered mental status. EXAM: CT HEAD WITHOUT CONTRAST TECHNIQUE: Contiguous axial images were obtained from the base of the skull through the vertex without intravenous contrast. RADIATION DOSE REDUCTION: This exam was performed according to the departmental dose-optimization program which includes automated exposure control, adjustment of the mA and/or kV according to patient size and/or use of iterative reconstruction technique. COMPARISON:  July 16, 2023 FINDINGS: Brain: There is  generalized cerebral atrophy with widening of the extra-axial spaces and ventricular dilatation. There are areas of decreased attenuation within the white matter tracts of the supratentorial brain, consistent with microvascular disease changes. Vascular: No hyperdense vessel or unexpected calcification. Skull: Normal. Negative for fracture or focal lesion. Sinuses/Orbits: No acute finding. Other: None. IMPRESSION: 1. Generalized cerebral atrophy and microvascular disease changes of the supratentorial brain. 2. No acute intracranial abnormality. Electronically Signed  By: Suzen Dials M.D.   On: 02/15/2024 14:35   IMPRESSION: Acute on chronic anemia, stable  -EGD 01/26/24 LA grade C esophagitis, gastritis (H.Pylori negative)  -Colonoscopy 02/2018 removal small adenoma x 2 SIRS, concern for sepsis, concern for right sided pneumonia on imaging Altered mentation, suspect from above Abdominal pain, undetermined  -CT imaging showed stool burden through colon Atrial fibrillation with rapid ventricular response, on Eliquis  Chronic obstructive pulmonary disease  PLAN: -No signs of active GI blood loss, monitor bowel movements, trend H/H, transfuse for Hgb < 7  -Eventually will benefit from updated colonoscopy to evaluate anemia, though not in current clinical state -Continue supportive care, empiric PPI, bowel regimen with dulcolax, can add Miralax  PRN as well -[facility] will follow   LOS: 2 days   Estefana Keas, [facility]

## 2024-02-17 NOTE — Progress Notes (Signed)
 PT Cancellation Note  Patient Details Name: Paula Wheeler MRN: [id number] DOB: 25-Jul-1950   Cancelled Treatment:    Reason Eval/Treat Not Completed: Medical issues which prohibited therapy, per RN, patient very lethargic, multiple medical,issues.  Darice Potters PT Acute Rehabilitation Services Office [phone]    Le, Ferraz 02/17/2024, 7:53 AM

## 2024-02-17 NOTE — Progress Notes (Signed)
 NAME:  Paula Wheeler, MRN:  [id number], DOB:  Feb 08, 1951, LOS: 2 ADMISSION DATE:  02/15/2024 CONSULTATION DATE:  02/16/2024 REFERRING MD:  Cheryle - TRH, CHIEF COMPLAINT:  AMS, hypotension, ?GIB   History of Present Illness:  73 year old woman who presented to [facility] ED 10/19 for AMS. PMHx significant for HTN, HLD, PAF (on Eliquis ), COPD, tobacco dependence, T2DM, IDA, history of GIB. Recent admission 9/27-10/9 for sepsis 2/2 cellulitis, discharged to rehab.   Patient presented to [facility] ED from her rehab facility via EMS for acute mental status change/increased confusion from baseline, per facility staff. On EMS arrival, patient was hypotensive and tachycardic in AF with RVR. Received LR bolus. On ED arrival, patient was afebrile with HR 115, BP 104/60, RR 20, SpO2 96%. Labs were notable for WBC 23.7, Hgb 6.9 (baseline ~9-10), Plt 242. Na 136, K 3.7, CO2 19, glucose 123. AG 22. Cr 2.97 (baseline 2.2-2.5). LFTs WNL. Lipase WNL. Trop 142 > 137, suspect demand-related. BNP 4432. PCT 0.76 > 1.03. LA 0.8. UA unremarkable. FOBT+. CXR with cardiomegaly, vascular congestion, small R pleural effusion/atelectasis. CT Head with generalized atrophy, NAICA. CT Renal Stone Protocol showed patchy RLL consolidation, small bilateral pleural effusions, cardiomegaly with small pericardial effusion, vascular kidney calcifications, moderate stool burden.  On 10/20, patient was noted to have increased WBC count (27.8 from 23.7) with stably low Hgb (7.3 from 6.9), worsening AKI (Cr 3.58 from 2.97) and increased AG to 31 (22). She was noted to be hypotensive with MAP high 50s and HR 110s.  PCCM consulted for evaluation.  Pertinent Medical History:   Past Medical History:  Diagnosis Date   Anemia    COPD (chronic obstructive pulmonary disease) (HCC)    Dyslipidemia    Exogenous obesity    GERD (gastroesophageal reflux disease)    GI bleed 05/14/2016   History of abnormal cells from cervix    Hyperlipidemia     Hypertension    Iron  deficiency anemia    Longstanding persistent atrial fibrillation (HCC)    Microalbuminuria    Tobacco dependence    Type II diabetes mellitus (HCC)    Significant Hospital Events: Including procedures, antibiotic start and stop dates in addition to other pertinent events   10/19 - Presented to [facility] ED from [facility] with AMS, hypotension. Concern for sepsis. 10/20 - Transferred to ICU for AF/RVR, hypotension. Vasopressors initiated. Repeat CT A/P with contrast with moderate R/small L pleural effusions, RLL collapse/consolidation, small pericardial effusion, stool throughout the colon.  Interim History / Subjective:  No significant events overnight Off of vasopressors, but remains lethargic/encephalopathic CT A/P as above; ordered for intractable abd pain Aggressive bowel regimen to include Relistor administration Worsened renal function/oliguria (Cr 4.15, BUN 125, UOP this AM) Lasix  challenge Brother updated at bedside  Objective:  Blood pressure (!) 122/51, pulse (!) 107, temperature 99.2 F (37.3 C), temperature source Axillary, resp. rate 15, height 5' 6 (1.676 m), weight 78.8 kg, SpO2 94%.        Intake/Output Summary (Last 24 hours) at 02/17/2024 0729 Last data filed at 02/17/2024 9371 Gross per 24 hour  Intake 1476.55 ml  Output 125 ml  Net 1351.55 ml   Filed Weights   02/15/24 1240 02/17/24 0331  Weight: 86 kg 78.8 kg   Physical Examination: General: Acute-on-chronically ill-appearing older woman in NAD. HEENT: [place]/AT, anicteric sclera, PERRL 3mm, dry mucous membranes. Neuro: Lethargic, unable to assess orientation. Responds to verbal stimuli/opens eyes to voice. Not following commands. Generalized weakness.  CV: Irregularly irregular rhythm, rate 100s, no m/g/r. PULM: Breathing even and unlabored on RA. Lung fields diminished at bilateral bases, R > L. GI: Obese, soft, mild-moderate distention, diffuse mild generalized TTP.  Hypoactive bowel sounds. Extremities: No LE edema noted. Skin: Warm/dry, BLE skin in poor repair. Unable to assess posterior.  Resolved Hospital Problem List:    Assessment & Plan:  Undifferentiated shock, presume sepsis versus cardiogenic component d/t AF/RVR, resolving - Goal MAP > 65 - Fluid resuscitation as tolerated, gentle now that patient is oliguric - Off of vasopressors since 10/20PM - Trend WBC, fever curve, LA - F/u Cx data - Continue broad-spectrum antibiotics (cefepime /Flagyl, can narrow or d/c once Cx NG48H)  Afib with RVR, improved Mildly elevated troponin Elevated BNP Concern for RV dysfunction HTN HLD Longstanding history of AF with DCCV 2018. Afib with RVR noted 10/20AM, longstanding history of AF on Eliquis , RVR new. CHA2DS2-VASc Score 6. - Holding AC for now in the setting of anemia (Hgb 7.0). Echo 10/20 with EF 55-60%, moderately reduced RV function, moderately elevated PASP (RVSP 46.56mmHg). - Cardiology following, appreciate assistance - Amiodarone gtt for rate control - Cardiac monitoring - Optimize electrolytes for K > 4, Mg > 2 - Avoid AV nodal blocking agents - Hold antihypertensives for now - Resume statin as appropriate  Abdominal pain, generalized Severe constipation CT Renal Stone Protocol with patchy RLL consolidation, small bilateral pleural effusions, cardiomegaly with small pericardial effusion, vascular kidney calcifications, moderate stool burden. CT repeated with contrast, given pain out of proportion to exam. - F/u repeat CT A/P with contrast - Multimodal pain control with APAP, Dilaudid  PRN - May need to d/c opioids and administer reversal agent - Aggressive bowel regimen  AKI on CKD stage IV, oliguric - Trend BMP - Replete electrolytes as indicated - Monitor I&Os - Lasix  challenge 120mg  IV x 1 10/21 - Avoid nephrotoxic agents as able - Ensure adequate renal perfusion - Low threshold for Nephro consult 10/22 if no improvement in  UOP, though patient would likely be a very poor dialysis candidate even short term due to multiple comorbidities and overall frailty  COPD Tobacco dependence history - Supplemental O2 support for sat > 90 % - Bronchodilators PRN - Pulmonary hygiene  Anemia, multifactorial History of IDA +FOBT, ?GIB. - Trend H&H (stable 7.0 10/21) - Monitor for signs of active bleeding - Transfuse for Hgb < 7.0 or hemodynamically significant bleeding - S/p 1U PRBCs 10/20  T2DM - Basal Semglee  10U daily added - SSI to resistant scale - CBGs Q4H - Goal CBG 140-180  At risk for malnutrition - RD/Nutrition consul as appropriate - Consider Cortrak, given ongoing encephalopahy/inability to safely take PO - Further plan pending GOC  GOC - PMT consulted due to multiple comorbidities and clinical complexity - May be approaching discussion of dialysis and similar aggressive interventions if no improvement - Patient would be a poor dialysis candidate, even short term - Remains Full Code at present   Critical care time:   The patient is critically ill with multiple organ system failure and requires high complexity decision making for assessment and support, frequent evaluation and titration of therapies, advanced monitoring, review of radiographic studies and interpretation of complex data.   Critical Care Time devoted to patient care services, exclusive of separately billable procedures, described in this note is 37 minutes.  Corean CHRISTELLA Alveda Vanhorne, PA-C [facility] 02/17/24 7:29 AM  Please see [email or web address] for pager details.  From 7A-7P if no response, please  call [phone] After hours, please call ELink [phone]

## 2024-02-17 NOTE — Progress Notes (Addendum)
 Progress Note  Patient Name: Paula Wheeler Date of Encounter: 02/17/2024 [facility] Cardiologist: Soyla DELENA Merck, MD   Interval Summary   Not alert or orientated. Son at bedside, cardiology questions answered  Vital Signs Vitals:   02/17/24 0900 02/17/24 1000 02/17/24 1100 02/17/24 1200  BP: (!) 135/40 (!) 117/36 (!) 113/33 (!) 124/49  Pulse: 99 (!) 107 (!) 51 73  Resp: 17 14 13 16   Temp: 99.5 F (37.5 C)   100.3 F (37.9 C)  TempSrc: Axillary   Axillary  SpO2: 96% 94% 96% 95%  Weight:      Height:        Intake/Output Summary (Last 24 hours) at 02/17/2024 1315 Last data filed at 02/17/2024 1156 Gross per 24 hour  Intake 889.24 ml  Output 125 ml  Net 764.24 ml      02/17/2024    3:31 AM 02/15/2024   12:40 PM 02/05/2024    5:00 AM  Last 3 Weights  Weight (lbs) 173 lb 11.6 oz 189 lb 9.5 oz 190 lb 4.8 oz  Weight (kg) 78.8 kg 86 kg 86.32 kg      Telemetry/ECG  AF with frequent ectopy HR improved avg ~90-110 - Personally Reviewed  Physical Exam  GEN: No acute distress though delirious and moaning/grunting randomly.   Neck: unable to assess JVD as patient continues to favor head rolling to right side  Cardiac: Irregularly, irregular, no murmurs, rubs, or gallops.  Respiratory: + rhonchi and wheeze bilaterally GI: Soft, nontender, non-distended  MS: No edema  Assessment & Plan  Paula Wheeler is a 73 y.o. female with a hx of hypertension, persistent atrial fibrillation/flutter, hyperlipidemia, severe bilateral carotid artery stenosis, chronic diastolic heart failure, peripheral claudication, CKD, T2DM, COPD, hx of GI bleed and tobacco use who presented on 10/19 for AMS. Imaging was concerning for volume overload and possible pneumonia. Noted to have a significant AKI on CKD [2.97 -> 3.58], leukocytosis [WBC 23.7], and anemic [Hgb 6.9] with fecal occult blood positive. Troponin 147 -> 137 Pro-BNP 4432 . She was started on IV fluids, antibiotics,  albumin, blood products and pain medications. Cardiology, GI and PCCM consulted.   Acute on Chronic heart failure with RV dysfunction Hypoalbuminemia [2.7 ->received IV albumin] On admission imaging showing signs of volume overload  Pro-BNP 4432  Has received IV fluids, albumin, and antibiotics as well as blood products. Net IO Since Admission: 1,554.48 mL [02/17/24 1321] Home diuresis and BB on hold with acute condition.   Limited echo obtained yesterday showing worsening RV function: LVEF 55-60% with mild LVH. Moderately reduced RV systolic function. Moderately elevated PASP 46.8 mmHg. Mildly dilated atrium. CT shows small pericardial effusion and bilaterally pleural effusions   On exam appears euvolemic though respiratory system does have adventitious sounds noted  RV failure most likely contributing to a certain degree patient's initial hypotension, though multifactorial in the setting of sepsis and and acute blood loss anemia. She is now maintaining blood pressure of pressors.   Recommended nephrology consult yesterday. Palliative care consult now placed, if not moving to comfort care would still recommend nephrology consult. Could benefit from diuresis though with renal function and tenacious hemodynamics will defer to primary Continue to hold PTA medications with hypotension and renal function    Persistent vs permanent atrial fibrillation with RVR History of DCCV in 2018, reverted back to AF several weeks later Paula Wheeler 6 Rates have improved, were in the 90s during interview. May be difficult to achieve complete  rate control though if she is in permeant AF as her RVR is most likely physiologic response to acute presentation.   She has been on pressors for hypotension, though now stopped will still continue to avoid AV nodal blocking agent [PTA bisoprolol  on hold]. AKI precludes short term digoxin. Would like to avoid rhythm control with her current inability to anticoagulate,  unlikely to convert chemically though will remain cautious.  Continue amio gtt Continue to hold anticoagulation with GI bleed, re-start when okay by GI/primary  Keep K >4 and Mag > 2, though unlikely to by hypo-  given her current degree of renal dysfunction and current electrolyte disturbances  Hx of hypertension Hypotension BP: 98/55 PTA medications on hold [amlodipine  5 mg, bisoprolol  5 mg, diuresis medications as above] Multifactorial as she is admitted with infection, AF RVR, and acute blood loss anemia receiving blood products. She required pressors yesterday to maintain perfusion.  Per primary   AKI on CKD Electrolyte disturbances Cr 2.97 -> 3.58 -> 4.15 Baseline appears ~2.1-2.2  May benefit from nephrology consult unless transitioning to comfort care.    CAD PAD with claudication Severe carotid artery stenosis-asymptomatic Patient not on antiplatelet 2/2 chronic anticoagulation Patient has been seen by [facility], has not undergone TCAR 2/2 to patient not showing up to [facility] for risk stratification based on chart review.    Hyperlipidemia Lipitor 80 mg & Zetia  10 mg on hold with acute presentation   Per primary GI bleed with acute blood loss anemia Acute metabolic encephalopathy Shock COPD exacerbation/ PNA GERD Moderate protein malnutrition Tobacco use Decubitus ulcer/heel wounds    For questions or updates, please contact [facility] Please consult [email or web address] for contact info under       Signed, Paula LOISE Salen, PA-C

## 2024-02-18 ENCOUNTER — Inpatient Hospital Stay (HOSPITAL_COMMUNITY)

## 2024-02-18 DIAGNOSIS — Z720 Tobacco use: Secondary | ICD-10-CM

## 2024-02-18 DIAGNOSIS — Z515 Encounter for palliative care: Secondary | ICD-10-CM | POA: Diagnosis not present

## 2024-02-18 DIAGNOSIS — I4891 Unspecified atrial fibrillation: Secondary | ICD-10-CM | POA: Diagnosis not present

## 2024-02-18 DIAGNOSIS — K5900 Constipation, unspecified: Secondary | ICD-10-CM | POA: Diagnosis not present

## 2024-02-18 DIAGNOSIS — A419 Sepsis, unspecified organism: Secondary | ICD-10-CM | POA: Diagnosis not present

## 2024-02-18 DIAGNOSIS — Z7189 Other specified counseling: Secondary | ICD-10-CM | POA: Diagnosis not present

## 2024-02-18 DIAGNOSIS — N179 Acute kidney failure, unspecified: Secondary | ICD-10-CM | POA: Diagnosis not present

## 2024-02-18 DIAGNOSIS — J441 Chronic obstructive pulmonary disease with (acute) exacerbation: Secondary | ICD-10-CM

## 2024-02-18 DIAGNOSIS — Z66 Do not resuscitate: Secondary | ICD-10-CM

## 2024-02-18 DIAGNOSIS — R579 Shock, unspecified: Secondary | ICD-10-CM | POA: Diagnosis not present

## 2024-02-18 DIAGNOSIS — D62 Acute posthemorrhagic anemia: Secondary | ICD-10-CM

## 2024-02-18 LAB — GLUCOSE, CAPILLARY
Glucose-Capillary: 132 mg/dL — ABNORMAL HIGH (ref 70–99)
Glucose-Capillary: 135 mg/dL — ABNORMAL HIGH (ref 70–99)
Glucose-Capillary: 136 mg/dL — ABNORMAL HIGH (ref 70–99)
Glucose-Capillary: 153 mg/dL — ABNORMAL HIGH (ref 70–99)
Glucose-Capillary: 158 mg/dL — ABNORMAL HIGH (ref 70–99)
Glucose-Capillary: 161 mg/dL — ABNORMAL HIGH (ref 70–99)
Glucose-Capillary: 196 mg/dL — ABNORMAL HIGH (ref 70–99)

## 2024-02-18 LAB — CBC WITH DIFFERENTIAL/PLATELET
Abs Immature Granulocytes: 1.17 K/uL — ABNORMAL HIGH (ref 0.00–0.07)
Basophils Absolute: 0.1 K/uL (ref 0.0–0.1)
Basophils Relative: 0 %
Eosinophils Absolute: 0 K/uL (ref 0.0–0.5)
Eosinophils Relative: 0 %
HCT: 21.2 % — ABNORMAL LOW (ref 36.0–46.0)
Hemoglobin: 6.8 g/dL — CL (ref 12.0–15.0)
Immature Granulocytes: 4 %
Lymphocytes Relative: 4 %
Lymphs Abs: 1.4 K/uL (ref 0.7–4.0)
MCH: 27.6 pg (ref 26.0–34.0)
MCHC: 32.1 g/dL (ref 30.0–36.0)
MCV: 86.2 fL (ref 80.0–100.0)
Monocytes Absolute: 2.7 K/uL — ABNORMAL HIGH (ref 0.1–1.0)
Monocytes Relative: 8 %
Neutro Abs: 26.8 K/uL — ABNORMAL HIGH (ref 1.7–7.7)
Neutrophils Relative %: 84 %
Platelets: 191 K/uL (ref 150–400)
RBC: 2.46 MIL/uL — ABNORMAL LOW (ref 3.87–5.11)
RDW: 19.9 % — ABNORMAL HIGH (ref 11.5–15.5)
Smear Review: NORMAL
WBC: 32.2 K/uL — ABNORMAL HIGH (ref 4.0–10.5)
nRBC: 0.1 % (ref 0.0–0.2)

## 2024-02-18 LAB — BASIC METABOLIC PANEL WITH GFR
Anion gap: 19 — ABNORMAL HIGH (ref 5–15)
Anion gap: 18 — ABNORMAL HIGH (ref 5–15)
BUN: 140 mg/dL — ABNORMAL HIGH (ref 8–23)
BUN: 146 mg/dL — ABNORMAL HIGH (ref 8–23)
CO2: 23 mmol/L (ref 22–32)
CO2: 23 mmol/L (ref 22–32)
Calcium: 9.1 mg/dL (ref 8.9–10.3)
Calcium: 9 mg/dL (ref 8.9–10.3)
Chloride: 94 mmol/L — ABNORMAL LOW (ref 98–111)
Chloride: 96 mmol/L — ABNORMAL LOW (ref 98–111)
Creatinine, Ser: 4.91 mg/dL — ABNORMAL HIGH (ref 0.44–1.00)
Creatinine, Ser: 5.16 mg/dL — ABNORMAL HIGH (ref 0.44–1.00)
GFR, Estimated: 9 mL/min — ABNORMAL LOW (ref >60)
GFR, Estimated: 8 mL/min — ABNORMAL LOW (ref >60)
Glucose, Bld: 208 mg/dL — ABNORMAL HIGH (ref 70–99)
Glucose, Bld: 169 mg/dL — ABNORMAL HIGH (ref 70–99)
Potassium: 3.4 mmol/L — ABNORMAL LOW (ref 3.5–5.1)
Potassium: 4 mmol/L (ref 3.5–5.1)
Sodium: 136 mmol/L (ref 135–145)
Sodium: 136 mmol/L (ref 135–145)

## 2024-02-18 LAB — MAGNESIUM
Magnesium: 3.2 mg/dL — ABNORMAL HIGH (ref 1.7–2.4)
Magnesium: 3.2 mg/dL — ABNORMAL HIGH (ref 1.7–2.4)

## 2024-02-18 LAB — HEMOGLOBIN AND HEMATOCRIT, BLOOD
HCT: 25.7 % — ABNORMAL LOW (ref 36.0–46.0)
Hemoglobin: 8.2 g/dL — ABNORMAL LOW (ref 12.0–15.0)

## 2024-02-18 LAB — PREPARE RBC (CROSSMATCH)

## 2024-02-18 LAB — PHOSPHORUS: Phosphorus: 4.8 mg/dL — ABNORMAL HIGH (ref 2.5–4.6)

## 2024-02-18 MED ORDER — POTASSIUM CHLORIDE 10 MEQ/100ML IV SOLN
10.0000 meq | INTRAVENOUS | Status: AC
  Administered 2024-02-18 (×4): 10 meq via INTRAVENOUS
  Filled 2024-02-18 (×4): qty 100

## 2024-02-18 MED ORDER — FUROSEMIDE 10 MG/ML IJ SOLN
120.0000 mg | Freq: Once | INTRAVENOUS | Status: AC
  Administered 2024-02-18: 120 mg via INTRAVENOUS
  Filled 2024-02-18: qty 10

## 2024-02-18 MED ORDER — SODIUM CHLORIDE 0.9% IV SOLUTION: Freq: Once | INTRAVENOUS | Status: AC

## 2024-02-18 NOTE — Progress Notes (Signed)
 Physical Therapy Discharge Patient Details Name: Paula Wheeler MRN: [id number] DOB: [birth year] Today's Date: 02/18/2024 Time:  -     Patient discharged from PT services secondary to medical decline - will need to re-order PT to resume therapy services.  Darice Potters PT Acute Rehabilitation Services Office [phone]  GP     [place], [place] 02/18/2024, 2:41 PM

## 2024-02-18 NOTE — Progress Notes (Signed)
 Daily Progress Note   Patient Name: Paula Wheeler       Date: 02/18/2024 DOB: 03-17-1951  Age: 73 y.o. MRN#: [id number] Attending Physician: Annella Donnice SAUNDERS, MD Primary Care Physician: Okey Carlin Redbird, MD Admit Date: 02/15/2024 Length of Stay: 3 days  Reason for Consultation/Follow-up: Establishing goals of care  Subjective:   CC: Patient lying in bed unresponsive.  Spoke with patient's brother at bedside.  Following up regarding complex medical decision making.  Subjective:  Reviewed EMR including recent documentation from PCCM, including overnight reports.  Patient had sudden drop in oxygen  saturation overnight requiring rapid escalation of oxygen  supplementation initially had to be placed on nonrebreather though currently down to HFNC 8 L/min.  Patient also requiring transfusion of PRBC x 1. Personally reviewed recent BMP noting BUN elevated to 140 and creatinine elevated to 4.91 with estimated GFR of 9.  Review of CBC also notes WBCs elevated to 32.2. Discussed care with bedside RN for medical updates.  Was informed when patient's brother, Paula Wheeler, was present at bedside.  Presented to bedside to see patient.  Patient unresponsive lying in bed.  Patient's brother, Paula Wheeler, present at bedside.  Paula Wheeler had brought in patient's ACP documentation so was reviewed and noted would place in EMR.  Patient does elect that HCPOA can make decisions regarding medical care.  Updated Paula Wheeler on concerns related to patient's worsening renal function and mentation associated with that.  Discussed continuing appropriate interventions at this time while discussing with PCCM and nephrology and next steps for management.  With patient's underlying multiple medical comorbidities and known history of CKD stage IV, unsure patient would be a good candidate for dialysis though noted would talk to team about this.  Paula Wheeler acknowledged this. We did discuss patient's worsening respiratory status overnight.   Discussed CODE STATUS again.  After reviewing and discussing patient's ACP documentation, Paula Wheeler supporting changing patient's to DNR/DNI which is very appropriate with her multiple medical comorbidities.  That patient's independence and quality of life was important to her and so being on machines would not be a good quality of life to her based on their prior discussions. Spent time answering questions as able.  Noted palliative medicine team will continue to follow along with patient's medical journey.  Discussed care with PCCM and bedside RN after visit.  Planning to coordinate with nephrology to assist in care planning moving forward.  Objective:   Vital Signs:  BP (!) 123/33   Pulse 94   Temp (!) 97.4 F (36.3 C) (Axillary)   Resp 15   Ht 5' 6 (1.676 m)   Wt 79.6 kg   SpO2 100%   BMI 28.32 kg/m   Physical Exam: General: Unresponsive, frail, chronically ill-appearing Cardiovascular: Tachycardia noted Respiratory: no increased work of breathing noted, not in respiratory distress Abdomen: not distended, not tender but to palpation Skin: Wounds present on lower extremities bilaterally, pale Neuro: Unresponsive  Assessment & Plan:   Assessment: Patient is 73 year old female with past medical history of atrial fibrillation on Eliquis , tobacco dependence, reported COPD but with no fixed obstruction on spirometry, CKD stage IV, type 2 diabetes mellitus, history of GI bleed, and multiple lower extremity wounds who was admitted on 02/15/2024 from rehab facility for management of acute mental status change.  Patient had recently been hospitalized from 9/27-10/9 for management of sepsis secondary to cellulitis and had been discharged to rehab after that hospitalization.  Since admission, patient has received management for undifferentiated shock presumed to be secondary to  sepsis versus cardiogenic, A-fib with RVR severe constipation and AKI on CKD.  Cardiology and GI consulted for  recommendations.  Palliative medicine team consulted to assist with complex medical decision making.   Recommendations/Plan: # Complex medical decision making/goals of care:     - Patient unable to participate in complex medical decision making due to underlying medical status.                  - Discussed care with patient's brother, Paula Wheeler, present at bedside.  Reviewed updates regarding patient's medical illnesses.  Patient has worsening renal function and mentation associated with this.  Reviewed patient's ACP documentation.  Paula Wheeler, who is patient's HCPOA, supportive change of CODE STATUS to DNR/DNI at this time while continuing other appropriate medical interventions.  Palliative medicine team continuing to follow along with patient's medical journey.                -   Code Status: Limited: Do not attempt resuscitation (DNR) -DNR-LIMITED -Do Not Intubate/DNI    # Psycho-social/Spiritual Support:  - Support System: 5 brothers; Paula Wheeler and Paula Wheeler is primary alternate  - Will have ACP documentation scanned into EMR   # Discharge Planning:  To Be Determined  Discussed with: PCCM provider, RN, patient's HCPOA/brother Paula Wheeler  Thank you for allowing the palliative care team to participate in the care Paula Wheeler.  Tinnie Radar, DO Palliative Care Provider PMT # [phone]  If patient remains symptomatic despite maximum doses, please call PMT at [phone] between 0700 and 1900. Outside of these hours, please call attending, as PMT does not have night coverage.  Billing based on MDM: High   Problems Addressed: One or more chronic illnesses with severe exacerbation, progression, or side effects of treatment.  Risks: Decision not to resuscitate or to de-escalate care because of poor prognosis

## 2024-02-18 NOTE — Progress Notes (Signed)
 Pt O2 saturation in the 80's. Lung sounds clear bilaterally and diminished at the bases Oxygen  was then increased on [place] with minimal changes. RN replaced [place] with NRB and called RT, pt does not appear to be in respiratory distress, breathing is unlabored and airway is clear O2 100% on NRB . RT arrived and placed pt on HFNC @6L , pt O2 96%. Pt O2 began to drop again, RN then increased O2 on HFNC per patient requirements. NRB was placed back on. [facility] RN was notifed of these changes

## 2024-02-18 NOTE — Progress Notes (Addendum)
 eLink Physician-Brief Progress Note Patient Name: Paula Wheeler DOB: [birth year] MRN: [id number]   Date of Service  02/18/2024  HPI/Events of Note  73 year old initially presented with acute on chronic anemia with known grade 2 esophagitis and gastritis.  Hemoglobin dropped from 7.0->6.8 without evidence of active bleeding  eICU Interventions  Transfuse 1 unit PRBC   0459 -before administration of any blood products, the patient had sudden drop in her oxygen  saturation requiring rapid escalation of her supplementation.  Currently on nonrebreather, in no respiratory distress.  Obtain chest radiograph  0540 -appears to have bilateral effusions and worsening edema.  Recommend repeat dose of Lasix   0546 - add kcl  Intervention Category Intermediate Interventions: Bleeding - evaluation and treatment with blood products  [name] 02/18/2024, 4:29 AM

## 2024-02-18 NOTE — Progress Notes (Signed)
 Lab called to notify RN that pt's hemoglobin is 6.8. no signs of active bleeding. Elink RN made aware.

## 2024-02-18 NOTE — Progress Notes (Signed)
 NAME:  Paula Wheeler, MRN:  [id number], DOB:  [birth year], LOS: 3 ADMISSION DATE:  02/15/2024 CONSULTATION DATE:  02/16/2024 REFERRING MD:  Cheryle - TRH, CHIEF COMPLAINT:  AMS, hypotension, ?GIB   History of Present Illness:  73 year old woman who presented to [facility] ED 10/19 for AMS. PMHx significant for HTN, HLD, PAF (on Eliquis ), COPD, tobacco dependence, T2DM, IDA, history of GIB. Recent admission 9/27-10/9 for sepsis 2/2 cellulitis, discharged to rehab.   Patient presented to [facility] ED from her rehab facility via EMS for acute mental status change/increased confusion from baseline, per facility staff. On EMS arrival, patient was hypotensive and tachycardic in AF with RVR. Received LR bolus. On ED arrival, patient was afebrile with HR 115, BP 104/60, RR 20, SpO2 96%. Labs were notable for WBC 23.7, Hgb 6.9 (baseline ~9-10), Plt 242. Na 136, K 3.7, CO2 19, glucose 123. AG 22. Cr 2.97 (baseline 2.2-2.5). LFTs WNL. Lipase WNL. Trop 142 > 137, suspect demand-related. BNP 4432. PCT 0.76 > 1.03. LA 0.8. UA unremarkable. FOBT+. CXR with cardiomegaly, vascular congestion, small R pleural effusion/atelectasis. CT Head with generalized atrophy, NAICA. CT Renal Stone Protocol showed patchy RLL consolidation, small bilateral pleural effusions, cardiomegaly with small pericardial effusion, vascular kidney calcifications, moderate stool burden.  On 10/20, patient was noted to have increased WBC count (27.8 from 23.7) with stably low Hgb (7.3 from 6.9), worsening AKI (Cr 3.58 from 2.97) and increased AG to 31 (22). She was noted to be hypotensive with MAP high 50s and HR 110s.  PCCM consulted for evaluation.  Pertinent Medical History:   Past Medical History:  Diagnosis Date   Anemia    COPD (chronic obstructive pulmonary disease) (HCC)    Dyslipidemia    Exogenous obesity    GERD (gastroesophageal reflux disease)    GI bleed 05/14/2016   History of abnormal cells from cervix    Hyperlipidemia     Hypertension    Iron  deficiency anemia    Longstanding persistent atrial fibrillation (HCC)    Microalbuminuria    Tobacco dependence    Type II diabetes mellitus (HCC)    Significant Hospital Events: Including procedures, antibiotic start and stop dates in addition to other pertinent events   10/19 - Presented to [facility] ED from [facility] with AMS, hypotension. Concern for sepsis. 10/20 - Transferred to ICU for AF/RVR, hypotension. Vasopressors initiated. Repeat CT A/P with contrast with moderate R/small L pleural effusions, RLL collapse/consolidation, small pericardial effusion, stool throughout the colon.  Interim History / Subjective:  Overnight, desaturation to 80s; fortunately lungs clear, no respiratory distress No improvement with increased O2 via [place], NRB improved SpO2 Weaned back down to [place] 8L Hgb 6.8, 1U PRBCs given Worsening renal failure with uptrending BUN/Cr, minimal UOP Ongoing GOC discussion with family  Objective:  Blood pressure (!) 123/33, pulse 94, temperature (!) 97.4 F (36.3 C), temperature source Axillary, resp. rate 15, height 5' 6 (1.676 m), weight 79.6 kg, SpO2 100%.        Intake/Output Summary (Last 24 hours) at 02/18/2024 0820 Last data filed at 02/18/2024 0736 Gross per 24 hour  Intake 916.85 ml  Output 80 ml  Net 836.85 ml   Filed Weights   02/15/24 1240 02/17/24 0331 02/18/24 0500  Weight: 86 kg 78.8 kg 79.6 kg   Physical Examination: General: Acute-on-chronically ill-appearing older woman in NAD. Resting comfortably in bed. HEENT: [place]/AT, anicteric sclera, PERRL 3mm, dry mucous membranes. Neuro: Lethargic, wakes/responds easily to voice but quickly falls asleep,  Responds to verbal stimuli. Following commands intermittently. Generalized weakness, moves all extremities. CV: Irregularly irregular rhythm, rate 80s-90s, no m/g/r. PULM: Breathing even and unlabored on 8L [place]. Lung fields diminished bilaterally. GI: Soft, mild  generalized TTP, mildly distended. Hypoactive bowel sounds. Extremities: No LE edema noted. Skin: Warm/dry, no rashes. Multiple wounds as documented by nursing with dressings in place.  Resolved Hospital Problem List:    Assessment & Plan:  Undifferentiated shock, presume sepsis versus cardiogenic component d/t AF/RVR, resolving - Goal SBP > 100 - Volume resuscitation as needed, at risk for volume overload with renal failure/oliguria - Levophed  titrated to goal MAP - Trend WBC, fever curve - F/u Cx data, NG48H - Remains on cefepime /Flagyl  Afib with RVR, improved Mildly elevated troponin Elevated BNP Concern for RV dysfunction HTN HLD Longstanding history of AF with DCCV 2018. Afib with RVR noted 10/20AM, longstanding history of AF on Eliquis , RVR new. CHA2DS2-VASc Score 6. Echo 10/20 with EF 55-60%, moderately reduced RV function, moderately elevated PASP (RVSP 46.11mmHg). - Holding [facility] for now in the setting of anemia (Hgb 6.8) - Cardiology following, appreciate assistance - Amio gtt for rate control - Cardiac monitoring - Optimize electrolytes for K > 4, Mg > 2 - Avoid AV nodal blocking agents, hold antihypertensives - Resume statin as appropriate  Abdominal pain, generalized Severe constipation CT Renal Stone Protocol with patchy RLL consolidation, small bilateral pleural effusions, cardiomegaly with small pericardial effusion, vascular kidney calcifications, moderate stool burden. CT repeated with contrast, given pain out of proportion to exam. Repeat CT A/P with moderate R/L small pleural effusions, RLL collapse/consolidation, stool throughout the colon. - Multimodal pain control with APAP, Dilaudid  PRN - S/p Relistor x 1, may need to repeat - Aggressive bowel regimen  AKI on CKD stage IV, oliguric - Trend BMP - Replete electrolytes as indicated - Monitor I&Os - Lasix  challenge unsuccessful 10/21 with persistent oliguria - Avoid nephrotoxic agents as able - Ensure  adequate renal perfusion - Discussed at length with brother, [name], re: poor dialysis candidacy due to multiple comorbidities, Cameryn's desired QOL and logistics of tolerating dialysis; we discussed that due to baseline CKD IV any dialysis initiation would be lifelong and would likely not drastically change the outcome of her illness - Family will discuss further GOC, Neprho consult if desiring HD trial but family is leaning away from this  COPD Tobacco dependence history - Supplemental O2 support for sat > 90% - Bronchodilators PRN - Pulmonary hygiene  Anemia, multifactorial History of IDA +FOBT, ?GIB. - Trend H&H - Monitor for signs of active bleeding - Transfuse for Hgb < 7.0 or hemodynamically significant bleeding - S/p 1U PRBCs 10/20, 1U PRBCs 10/22 - Appreciate GI input  T2DM - Basal Semglee  10U - SSI resistant scale - CBGs Q4H - Goal CBG 140-180  At risk for malnutrition - RD/Nutrition consult as appropriate - Consider Cortrak, given ongoing encephalopathy; will revisit pending GOC discussion  GOC - PMT consulted, following; appreciate recommendations - Awaiting family discussion re: GOC, patient is a poor dialysis candidate - DNR/DNI as of 10/22   Critical care time:   The patient is critically ill with multiple organ system failure and requires high complexity decision making for assessment and support, frequent evaluation and titration of therapies, advanced monitoring, review of radiographic studies and interpretation of complex data.   Critical Care Time devoted to patient care services, exclusive of separately billable procedures, described in this note is 36 minutes.  Corean CHRISTELLA Jamara Vary, PA-C [facility] 02/18/24 8:20 AM  Please see [email or web address] for pager details.  From 7A-7P if no response, please call [phone] After hours, please call ELink [phone]

## 2024-02-18 NOTE — IPAL (Signed)
  Interdisciplinary Goals of Care Family Meeting   Date carried out: 02/18/2024  Location of the meeting: Bedside  Members involved: Patient's brothers, Paula and Paula Wheeler, PCCM PA  Durable Power of Attorney or acting medical decision maker: Paula Wheeler    Discussion: We discussed goals of care for Paula Wheeler. Met with two of Paula Wheeler's brothers, Paula and Paula Wheeler. We talked openly about Paula Wheeler's clinical trajectory over the past two years; she has been through a lot, most acutely in the last few months after her hospitalization for sepsis 2/2 cellulitis. She was able to be discharged to rehab Paula Wheeler) but returned to the hospital after she was noted to be acutely altered this past weekend. We talked about Paula Wheeler's multiple chronic conditions and how these play a role in her ability to recover, especially after each acute insult. Her brothers note that they are surprised her kidneys have hung in so long and that she hasn't required dialysis up to this point. She was still working as recently as this spring and had just received a bonus for her efforts.  We reviewed Paula Wheeler's current clinical concerns, including longstanding Afib (with intermittent RVR), sepsis/hypotension, metabolic encephalopathy and now progressive kidney failure. We discussed the symptoms, complications and concerns that come with renal failure (including confusion, electrolyte abnormalities and volume overload). We discussed that if the kidneys are unable to clear the bloodstream and create urine, these things (electrolytes/byproducts/fluid) will continue to build up. Given Paula Wheeler's frail state and multiple comorbidities, I recommended against dialysis, as this would likely not be a short term solution with renal recovery and would more likely be lifelong continued dialysis. Both brothers agree that this is not something that Paula Wheeler would want or something that would lead to a quality of life that was  acceptable to her. They describe Paula Wheeler as smart, spunky and stubborn and they are in agreement that dialysis would not align with their sister's wishes.  We will continue with current measures for now; Paula notes that there are a couple of hospice facilities near their home that he would like to consider for placement. Will relay this to PMT.  Code status:   Code Status: Limited: Do not attempt resuscitation (DNR) -DNR-LIMITED -Do Not Intubate/DNI    Disposition: Continue current acute care; recommend initiation of referral for inpatient hospice.  Time spent for the meeting: 22 minutes  [name], [place] [facility] 02/18/24 5:49 PM

## 2024-02-18 NOTE — Progress Notes (Signed)
 [facility] Progress Note  SUBJECTIVE:   Interval history: Paula Wheeler was seen and evaluated today at bedside. Notes from palliative care team reviewed. No family at bedside at time of my evaluation. Per nursing staff, patient with non-bloody bowel movement today, no emesis. Receiving transfusion. Patient able to engage in limited conversation with me today which is improvement from prior, denied chest pain, shortness of breath and abdominal pain.   Past Medical History:  Diagnosis Date   Anemia    COPD (chronic obstructive pulmonary disease) (HCC)    Dyslipidemia    Exogenous obesity    GERD (gastroesophageal reflux disease)    GI bleed 05/14/2016   History of abnormal cells from cervix    Hyperlipidemia    Hypertension    Iron  deficiency anemia    Longstanding persistent atrial fibrillation (HCC)    Microalbuminuria    Tobacco dependence    Type II diabetes mellitus (HCC)    Past Surgical History:  Procedure Laterality Date   CARDIOVERSION N/A 08/07/2016   Procedure: CARDIOVERSION;  Surgeon: Aleene JINNY Passe, MD;  Location: [facility] ENDOSCOPY;  Service: Cardiovascular;  Laterality: N/A;   DIAGNOSTIC MAMMOGRAM  05/2015   ESOPHAGOGASTRODUODENOSCOPY N/A 05/15/2016   Procedure: ESOPHAGOGASTRODUODENOSCOPY (EGD);  Surgeon: Lesta JULIANNA Fitz, MD;  Location: [facility] ENDOSCOPY;  Service: Endoscopy;  Laterality: N/A;   ESOPHAGOGASTRODUODENOSCOPY N/A 01/26/2024   Procedure: EGD (ESOPHAGOGASTRODUODENOSCOPY);  Surgeon: Dianna Specking, MD;  Location: [facility];  Service: Gastroenterology;  Laterality: N/A;   EXPLORATORY LAPAROTOMY  1968   I think they left my appendix   PAP SMEAR  2012 AND 2017   Current Facility-Administered Medications  Medication Dose Route Frequency Provider Last Rate Last Admin   acetaminophen  (TYLENOL ) tablet 650 mg  650 mg Oral Q6H PRN Celinda Alm Lot, MD       Or   acetaminophen  (TYLENOL ) suppository 650 mg  650 mg Rectal Q6H PRN Celinda Alm Lot, MD        amiodarone (NEXTERONE PREMIX) 360-4.14 MG/200ML-% (1.8 mg/mL) IV infusion  30 mg/hr Intravenous Continuous Garrick Leontine SAILOR, PA-C 16.67 mL/hr at 02/18/24 1151 30 mg/hr at 02/18/24 1151   ceFEPIme  (MAXIPIME ) 2 g in sodium chloride  0.9 % 100 mL IVPB  2 g Intravenous Q24H Ilah Krabbe M, [place]   Stopped at 02/17/24 1415   Chlorhexidine  Gluconate Cloth 2 % PADS 6 each  6 each Topical Daily Celinda Alm Lot, MD   6 each at 02/18/24 1123   collagenase (SANTYL) ointment   Topical Daily Celinda Alm Lot, MD   Given at 02/18/24 1124   HYDROmorphone  (DILAUDID ) injection 0.5-1 mg  0.5-1 mg Intravenous Q2H PRN Ilah Krabbe HERO, PA-C   1 mg at 02/17/24 2107   Influenza vac split trivalent PF (FLUZONE HIGH-DOSE) injection 0.5 mL  0.5 mL Intramuscular Tomorrow-1000 Cheryle Page, MD       insulin  aspart (novoLOG ) injection 0-20 Units  0-20 Units Subcutaneous Q4H Ilah Krabbe HERO, PA-C   4 Units at 02/18/24 1234   insulin  glargine-yfgn (SEMGLEE ) injection 10 Units  10 Units Subcutaneous Daily Ilah Krabbe HERO, PA-C   10 Units at 02/18/24 9096   levalbuterol  (XOPENEX ) nebulizer solution 1.25 mg  1.25 mg Nebulization Q6H PRN Cheryle Page, MD       metroNIDAZOLE (FLAGYL) IVPB 500 mg  500 mg Intravenous BID Ilah Krabbe M, PA-C   Stopped at 02/18/24 1027   norepinephrine  (LEVOPHED ) 4mg  in (0.016 mg/mL) premix infusion  0-10 mcg/min Intravenous Titrated Ilah Krabbe M, PA-C 7.5  mL/hr at 02/18/24 1122 2 mcg/min at 02/18/24 1122   ondansetron  (ZOFRAN ) tablet 4 mg  4 mg Oral Q6H PRN Celinda Alm Lot, MD       Or   ondansetron  (ZOFRAN ) injection 4 mg  4 mg Intravenous Q6H PRN Celinda Alm Lot, MD       Oral care mouth rinse  15 mL Mouth Rinse 4 times per day Celinda Alm Lot, MD   15 mL at 02/18/24 1235   Oral care mouth rinse  15 mL Mouth Rinse PRN Celinda Alm Lot, MD       pantoprazole  (PROTONIX ) injection 40 mg  40 mg Intravenous Q12H Celinda Alm Lot, MD   40 mg at  02/18/24 9095   Allergies as of 02/15/2024 - Review Complete 02/15/2024  Allergen Reaction Noted   Nsaids Swelling 02/27/2016   Penicillins Hives 02/27/2016   Quinapril  hcl Other (See Comments) 06/20/2021   Review of Systems:  Review of Systems  Respiratory:  Negative for shortness of breath.   Cardiovascular:  Negative for chest pain.  Gastrointestinal:  Negative for abdominal pain.    OBJECTIVE:   Temp:  [96 F (35.6 C)-98.6 F (37 C)] 97.4 F (36.3 C) (10/22 1227) Pulse Rate:  [69-118] 99 (10/22 1207) Resp:  [5-28] 17 (10/22 1207) BP: (85-135)/(30-70) 132/57 (10/22 1207) SpO2:  [83 %-100 %] 99 % (10/22 1207) Weight:  [79.6 kg] 79.6 kg (10/22 0500) Last BM Date : 02/18/24 Physical Exam Constitutional:      General: She is not in acute distress. Cardiovascular:     Rate and Rhythm: Normal rate and regular rhythm.  Pulmonary:     Effort: No respiratory distress.     Breath sounds: Normal breath sounds.  Abdominal:     General: Bowel sounds are normal. There is no distension.     Palpations: Abdomen is soft.     Tenderness: There is abdominal tenderness (diffuse).  Skin:    General: Skin is warm and dry.  Neurological:     Mental Status: She is easily aroused. She is lethargic.     Labs: Recent Labs    02/16/24 1526 02/16/24 1528 02/17/24 0322 02/18/24 0323  WBC 28.5*  --  26.7* 32.2*  HGB 7.0*  --  7.0* 6.8*  HCT 22.5*  --  22.5* 21.2*  PLT 198 197 195 191   BMET Recent Labs    02/17/24 0322 02/17/24 1558 02/18/24 0323  NA 134* 136 136  K 4.0 4.0 3.4*  CL 91* 92* 94*  CO2 16* 18* 23  GLUCOSE 265* 338* 208*  BUN 125* 137* 140*  CREATININE 4.15* 4.61* 4.91*  CALCIUM  9.2 9.0 9.1   LFT Recent Labs    02/17/24 0322  PROT 5.4*  ALBUMIN 3.0*  AST 44*  ALT 17  ALKPHOS 122  BILITOT 0.9   PT/INR Recent Labs    02/16/24 1528  LABPROT 27.4*  INR 2.4*   Diagnostic imaging: DG CHEST PORT 1 VIEW Result Date: 02/18/2024 EXAM: 1 VIEW(S) XRAY  OF THE CHEST 02/18/2024 05:22:00 AM COMPARISON: 02/15/2024 CLINICAL HISTORY: 200808 Hypoxia 200808. hypoxia FINDINGS: LUNGS AND PLEURA: Bilateral pleural effusions. Increased interstitial markings. No pneumothorax. HEART AND MEDIASTINUM: Cardiomegaly. BONES AND SOFT TISSUES: No acute osseous abnormality. IMPRESSION: 1. Bilateral veiling pleural effusions. 2. Bilateral increased interstitial markings, consider acute pulmonary edema. 3. Cardiomegaly. Electronically signed by: Helayne Hurst MD 02/18/2024 05:49 AM EDT RP Workstation: [id number]   ECHOCARDIOGRAM LIMITED Result Date: 02/16/2024    ECHOCARDIOGRAM LIMITED REPORT  Patient Name:   Paula Wheeler Date of Exam: 02/16/2024 Medical Rec #:  [id number]         Height:       66.0 in Accession #:    [id number]        Weight:       189.6 lb Date of Birth:  October 21, 1950         BSA:          1.955 m Patient Age:    73 years          BP:           113/50 mmHg Patient Gender: F                 HR:           93 bpm. Exam Location:  Inpatient Procedure: Limited Echo, Cardiac Doppler and Color Doppler (Both Spectral and            Color Flow Doppler were utilized during procedure). Indications:    CHF  History:        Patient has prior history of Echocardiogram examinations.  Sonographer:    Norleen Amour Referring Phys: [id number] LOGAN N LOCKWOOD IMPRESSIONS  1. Left ventricular ejection fraction, by estimation, is 55 to 60%. The left ventricle has normal function. There is mild left ventricular hypertrophy. Left ventricular diastolic parameters are indeterminate.  2. Right ventricular systolic function is moderately reduced. The right ventricular size is normal. There is moderately elevated pulmonary artery systolic pressure. The estimated right ventricular systolic pressure is 46.8 mmHg.  3. Left atrial size was mildly dilated.  4. Right atrial size was mildly dilated.  5. The mitral valve is grossly normal. Trivial mitral valve regurgitation.  6. The aortic valve is  tricuspid. There is mild calcification of the aortic valve. Aortic valve regurgitation is trivial. Aortic valve sclerosis/calcification is present, without any evidence of aortic stenosis.  7. The inferior vena cava is dilated in size with <50% respiratory variability, suggesting right atrial pressure of 15 mmHg. FINDINGS  Left Ventricle: Left ventricular ejection fraction, by estimation, is 55 to 60%. The left ventricle has normal function. There is mild left ventricular hypertrophy. Left ventricular diastolic parameters are indeterminate. Right Ventricle: The right ventricular size is normal. Right ventricular systolic function is moderately reduced. There is moderately elevated pulmonary artery systolic pressure. The tricuspid regurgitant velocity is 2.82 m/s, and with an assumed right atrial pressure of 15 mmHg, the estimated right ventricular systolic pressure is 46.8 mmHg. Left Atrium: Left atrial size was mildly dilated. Right Atrium: Right atrial size was mildly dilated. Mitral Valve: The mitral valve is grossly normal. Trivial mitral valve regurgitation. Tricuspid Valve: The tricuspid valve is grossly normal. Tricuspid valve regurgitation is mild. Aortic Valve: The aortic valve is tricuspid. There is mild calcification of the aortic valve. Aortic valve regurgitation is trivial. Aortic valve sclerosis/calcification is present, without any evidence of aortic stenosis. Aortic valve mean gradient measures 6.5 mmHg. Aortic valve peak gradient measures 12.2 mmHg. Aortic valve area, by VTI measures 2.27 cm. Venous: The inferior vena cava is dilated in size with less than 50% respiratory variability, suggesting right atrial pressure of 15 mmHg. IAS/Shunts: No atrial level shunt detected by color flow Doppler. LEFT VENTRICLE PLAX 2D LVIDd:         4.90 cm      Diastology LVIDs:         3.40 cm      LV e' medial:  8.59 cm/s LV PW:         1.10 cm      LV E/e' medial:  16.1 LV IVS:        1.10 cm      LV e'  lateral:   10.30 cm/s LVOT diam:     2.10 cm      LV E/e' lateral: 13.4 LV SV:         67 LV SV Index:   34 LVOT Area:     3.46 cm  LV Volumes (MOD) LV vol d, MOD A2C: 97.8 ml LV vol d, MOD A4C: 106.0 ml LV vol s, MOD A2C: 41.0 ml LV vol s, MOD A4C: 31.4 ml LV SV MOD A2C:     56.8 ml LV SV MOD A4C:     106.0 ml LV SV MOD BP:      70.0 ml IVC IVC diam: 2.50 cm LEFT ATRIUM           Index LA diam:      4.40 cm 2.25 cm/m LA Vol (A4C): 72.5 ml 37.09 ml/m  AORTIC VALVE AV Area (Vmax):    2.56 cm AV Area (Vmean):   2.21 cm AV Area (VTI):     2.27 cm AV Vmax:           175.00 cm/s AV Vmean:          118.500 cm/s AV VTI:            0.296 m AV Peak Grad:      12.2 mmHg AV Mean Grad:      6.5 mmHg LVOT Vmax:         129.22 cm/s LVOT Vmean:        75.749 cm/s LVOT VTI:          0.194 m LVOT/AV VTI ratio: 0.65  AORTA Ao Root diam: 3.00 cm MITRAL VALVE                TRICUSPID VALVE MV Area (PHT): 3.65 cm     TR Peak grad:   31.8 mmHg MV Decel Time: 208 msec     TR Vmax:        282.00 cm/s MV E velocity: 138.00 cm/s                             SHUNTS                             Systemic VTI:  0.19 m                             Systemic Diam: 2.10 cm Soyla Merck MD Electronically signed by Soyla Merck MD Signature Date/Time: 02/16/2024/3:43:18 PM    Final    CT ABDOMEN PELVIS W CONTRAST Result Date: 02/16/2024 CLINICAL DATA:  Sepsis. EXAM: CT ABDOMEN AND PELVIS WITH CONTRAST TECHNIQUE: Multidetector CT imaging of the abdomen and pelvis was performed using the standard protocol following bolus administration of intravenous contrast. RADIATION DOSE REDUCTION: This exam was performed according to the departmental dose-optimization program which includes automated exposure control, adjustment of the mA and/or kV according to patient size and/or use of iterative reconstruction technique. CONTRAST:  80mL OMNIPAQUE  IOHEXOL  300 MG/ML  SOLN COMPARISON:  02/15/2024. FINDINGS: Lower chest: Right lower lobe  collapse/consolidation. Moderate right and small left pleural effusions.  Compressive atelectasis in the left lower lobe. Subsegmental volume loss in the right middle lobe and lingula. Atherosclerotic calcification of the aorta, aortic valve and coronary arteries. Heart is enlarged. Small pericardial effusion. Fluid in the distal esophagus. Hepatobiliary: Liver and gallbladder are unremarkable. No biliary ductal dilatation. Pancreas: Negative. Spleen: Negative. Adrenals/Urinary Tract: Slight thickening of the adrenal glands, unchanged. No specific follow-up necessary. Renal vascular calcifications bilaterally. Subcentimeter low-attenuation lesions in the kidneys are too small to characterize. No specific follow-up necessary. Ureters are decompressed. Bladder is decompressed with a Foley catheter in place. Stomach/Bowel: Stomach and small bowel are unremarkable. Appendix is not readily visualized. Stool throughout the colon. Vascular/Lymphatic: Atherosclerotic calcification of the aorta. No pathologically enlarged lymph nodes. Reproductive: Uterus is visualized.  No adnexal mass. Other: Presacral edema. No free fluid. Mesenteries and peritoneum are unremarkable. Musculoskeletal: Sarcopenia. Osteopenia. Degenerative changes in the spine. IMPRESSION: 1. Moderate right and small left pleural effusions. 2. Right lower lobe collapse/consolidation may be due to pneumonia. Small pericardial effusion. 3. Stool throughout the colon. 4. Aortic atherosclerosis (ICD10-I70.0). Coronary artery calcification. Electronically Signed   By: Newell Eke M.D.   On: 02/16/2024 15:10   IMPRESSION: Acute on chronic anemia, stable, no signs of GI bleeding             -EGD 01/26/24 LA grade C esophagitis, gastritis (H.Pylori negative)             -Colonoscopy 02/2018 removal small adenoma x 2 Acute kidney injury SIRS, concern for sepsis, concern for right sided pneumonia on imaging Altered mentation, suspect from above,  improving? Abdominal pain, undetermined, stable             -CT imaging showed stool burden through colon Atrial fibrillation with rapid ventricular response, on Eliquis  Chronic obstructive pulmonary disease  PLAN: -No signs of active GI blood loss, no plan for endoscopy at this time -Trend H/H, transfuse for Hgb < 7 -Continue supportive care and palliative care discussions -[facility] will be available as needed    LOS: 3 days   Estefana Keas, [facility]

## 2024-02-18 NOTE — Progress Notes (Signed)
 OT Cancellation Note  Patient Details Name: Paula Wheeler MRN: [id number] DOB: 1951/04/05   Cancelled Treatment:    Reason Eval/Treat Not Completed: Medical issues which prohibited therapy;Fatigue/lethargy limiting ability to participate  Nursing reports patient unable to be seen for therapy this day. Signing off from OT due to inability to participate in therapy for several days. Please re-order when medically able. Thak you for this referral.   Geni OT/L Acute Rehabilitation Department  [phone]    02/18/2024, 11:44 AM

## 2024-02-18 NOTE — TOC Initial Note (Signed)
 Transition of Care [facility]) - Initial/Assessment Note    Patient Details  Name: Paula Wheeler MRN: [id number] Date of Birth: 1950/09/03  [facility]) CM/SW Contact:    Jon ONEIDA Anon, RN Phone Number: 02/18/2024, 4:22 PM  Clinical Narrative:                 Pt requiring 8L O2 HFNC at this time. Having GOC discussions with Palliative medicine. ICM will continue to follow to assist with any DC needs.   Expected Discharge Plan:  (TBD) Barriers to Discharge: Continued Medical Work up   Patient Goals and CMS Choice Patient states their goals for this hospitalization and ongoing recovery are:: Family having GOC discussions with Palliative medicine team CMS Medicare.gov Compare Post Acute Care list provided to:: Other (Comment Required) (NA) Choice offered to / list presented to : NA [place] ownership interest in [facility].provided to:: Parent NA    Expected Discharge Plan and Services In-house Referral: Hospice / Palliative Care Discharge Planning Services: CM Consult Post Acute Care Choice:  (TBD) Living arrangements for the past 2 months: Single Family Home                 DME Arranged: N/A DME Agency: NA       HH Arranged: NA HH Agency: NA        Prior Living Arrangements/Services Living arrangements for the past 2 months: Single Family Home Lives with:: Self Patient language and need for interpreter reviewed:: Yes        Need for Family Participation in Patient Care: Yes (Comment) Care giver support system in place?: Yes (comment) Current home services: DME Criminal Activity/Legal Involvement Pertinent to Current Situation/Hospitalization: No - Comment as needed  Activities of Daily Living   ADL Screening (condition at time of admission) Independently performs ADLs?: No Does the patient have a NEW difficulty with bathing/dressing/toileting/self-feeding that is expected to last >3 days?: Yes (Initiates electronic notice to provider  for possible OT consult) Does the patient have a NEW difficulty with getting in/out of bed, walking, or climbing stairs that is expected to last >3 days?: Yes (Initiates electronic notice to provider for possible PT consult) Does the patient have a NEW difficulty with communication that is expected to last >3 days?: Yes (Initiates electronic notice to provider for possible SLP consult) Is the patient deaf or have difficulty hearing?: No Does the patient have difficulty seeing, even when wearing glasses/contacts?: Yes Does the patient have difficulty concentrating, remembering, or making decisions?: No  Permission Sought/Granted Permission sought to share information with : Family Supports    Share Information with NAME: Ramnauth,Donald (Brother)  [phone]           Emotional Assessment Appearance:: Other (Comment Required (UTA) Attitude/Demeanor/Rapport: Unable to Assess Affect (typically observed): Unable to Assess Orientation: : Oriented to Self Alcohol / Substance Use: Not Applicable Psych Involvement: No (comment)  Admission diagnosis:  GI bleed [K92.2] Demand ischemia (HCC) [I24.89] Symptomatic anemia [D64.9] Gastrointestinal hemorrhage, unspecified gastrointestinal hemorrhage type [K92.2] Sepsis, due to unspecified organism, unspecified whether acute organ dysfunction present [facility]) [A41.9] Patient Active Problem List   Diagnosis Date Noted   DNR (do not resuscitate) 02/18/2024   Palliative care encounter 02/17/2024   Goals of care, counseling/discussion 02/17/2024   Counseling and coordination of care 02/17/2024   Demand ischemia (HCC) 02/16/2024   Prolonged QT interval 02/15/2024   Acute on chronic blood loss anemia 02/15/2024   Gout 02/15/2024   Controlled type 2  diabetes mellitus without complication (HCC) 02/15/2024   Moderate protein malnutrition 02/15/2024   Sepsis due to cellulitis (HCC) 01/24/2024   Acute respiratory failure with hypoxia (HCC) 09/27/2023    Anemia, iron  deficiency 09/27/2023   Cardiomegaly 09/27/2023   COPD with acute exacerbation (HCC) 09/27/2023   Mild protein malnutrition 09/27/2023   CAP (community acquired pneumonia) 09/27/2023   Acute on chronic diastolic congestive heart failure (HCC) 09/27/2023   Skin ulcer of toe, unspecified laterality, limited to breakdown of skin (HCC) 08/20/2023   Brain lesion 07/16/2023   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 04/07/2023   Carotid artery disease 08/06/2022   Pain due to onychomycosis of toenails of both feet 05/27/2022   Pressure injury of skin 11/28/2019   Acute renal failure (ARF) 11/23/2019   Lactic acidosis    Hyperkalemia    Acute encephalopathy    Sepsis (HCC)    Shock circulatory (HCC)    Acute renal failure superimposed on stage 4 chronic kidney disease (HCC) 01/29/2018   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   GERD (gastroesophageal reflux disease) 01/29/2018   Depression 01/29/2018   Tobacco abuse 01/29/2018   Elevated troponin 01/29/2018   Leukocytosis 01/29/2018   Chronic diastolic CHF (congestive heart failure) (HCC) 01/29/2018   CKD (chronic kidney disease) stage 4, GFR 15-29 ml/min (HCC)    SOB (shortness of breath)    PAD (peripheral artery disease) 12/23/2017   Persistent atrial fibrillation 06/10/2016   GI bleed 05/14/2016   Symptomatic anemia 05/14/2016   Diabetes mellitus with complication (HCC) 05/14/2016   Essential hypertension 05/14/2016   HLD (hyperlipidemia) 05/14/2016   Anemia    Renal insufficiency    PCP:  Okey Carlin Red[place], MD Pharmacy:   [id number] - [place], [place] - [address] AT [facility] [address] & [facility] [address] [place] [address] Phone: [phone] Fax: [phone]     Social Drivers of Health (SDOH) Social History: SDOH Screenings   Food Insecurity: Patient Unable To Answer (02/15/2024)  Housing: Unknown (02/15/2024)  Transportation Needs:  Patient Unable To Answer (02/15/2024)  Utilities: Patient Unable To Answer (02/15/2024)  Social Connections: Patient Unable To Answer (02/15/2024)  Recent Concern: Social Connections - Socially Isolated (01/24/2024)  Tobacco Use: High Risk (02/15/2024)   SDOH Interventions:     Readmission Risk Interventions    02/18/2024    4:19 PM 01/26/2024    8:31 AM  Readmission Risk Prevention Plan  Transportation Screening Complete Complete  HRI or Home Care Consult  Complete  Social Work Consult for Recovery Care Planning/Counseling  Complete  Palliative Care Screening  Not Applicable  Medication Review Oceanographer) Complete Complete  PCP or Specialist appointment within 3-5 days of discharge Complete   HRI or Home Care Consult Complete   SW Recovery Care/Counseling Consult Complete   Palliative Care Screening Complete   Skilled Nursing Facility Complete

## 2024-02-19 ENCOUNTER — Ambulatory Visit: Admitting: Podiatry

## 2024-02-19 DIAGNOSIS — R9431 Abnormal electrocardiogram [ECG] [EKG]: Secondary | ICD-10-CM | POA: Diagnosis not present

## 2024-02-19 DIAGNOSIS — Z515 Encounter for palliative care: Secondary | ICD-10-CM | POA: Diagnosis not present

## 2024-02-19 DIAGNOSIS — Z66 Do not resuscitate: Secondary | ICD-10-CM | POA: Diagnosis not present

## 2024-02-19 DIAGNOSIS — D649 Anemia, unspecified: Secondary | ICD-10-CM | POA: Diagnosis not present

## 2024-02-19 DIAGNOSIS — J9601 Acute respiratory failure with hypoxia: Secondary | ICD-10-CM | POA: Diagnosis not present

## 2024-02-19 DIAGNOSIS — Z7189 Other specified counseling: Secondary | ICD-10-CM

## 2024-02-19 DIAGNOSIS — A419 Sepsis, unspecified organism: Principal | ICD-10-CM

## 2024-02-19 DIAGNOSIS — Z79899 Other long term (current) drug therapy: Secondary | ICD-10-CM

## 2024-02-19 DIAGNOSIS — R52 Pain, unspecified: Secondary | ICD-10-CM

## 2024-02-19 DIAGNOSIS — Z711 Person with feared health complaint in whom no diagnosis is made: Secondary | ICD-10-CM

## 2024-02-19 DIAGNOSIS — K922 Gastrointestinal hemorrhage, unspecified: Secondary | ICD-10-CM | POA: Diagnosis not present

## 2024-02-19 DIAGNOSIS — F419 Anxiety disorder, unspecified: Secondary | ICD-10-CM

## 2024-02-19 LAB — BPAM RBC
Blood Product Expiration Date: 202511182359
Blood Product Expiration Date: 202511152359
Blood Product Expiration Date: 202511122359
ISSUE DATE / TIME: 202510220850
ISSUE DATE / TIME: 202510200825
ISSUE DATE / TIME: 202510191521
Unit Type and Rh: 5100
Unit Type and Rh: 5100
Unit Type and Rh: 5100

## 2024-02-19 LAB — TYPE AND SCREEN
ABO/RH(D): O POS
Antibody Screen: NEGATIVE
Unit division: 0
Unit division: 0
Unit division: 0

## 2024-02-19 LAB — BASIC METABOLIC PANEL WITH GFR
Anion gap: 19 — ABNORMAL HIGH (ref 5–15)
BUN: 149 mg/dL — ABNORMAL HIGH (ref 8–23)
CO2: 21 mmol/L — ABNORMAL LOW (ref 22–32)
Calcium: 9.2 mg/dL (ref 8.9–10.3)
Chloride: 96 mmol/L — ABNORMAL LOW (ref 98–111)
Creatinine, Ser: 5.46 mg/dL — ABNORMAL HIGH (ref 0.44–1.00)
GFR, Estimated: 8 mL/min — ABNORMAL LOW (ref >60)
Glucose, Bld: 131 mg/dL — ABNORMAL HIGH (ref 70–99)
Potassium: 4.3 mmol/L (ref 3.5–5.1)
Sodium: 136 mmol/L (ref 135–145)

## 2024-02-19 LAB — CBC
HCT: 24.9 % — ABNORMAL LOW (ref 36.0–46.0)
Hemoglobin: 8 g/dL — ABNORMAL LOW (ref 12.0–15.0)
MCH: 28.1 pg (ref 26.0–34.0)
MCHC: 32.1 g/dL (ref 30.0–36.0)
MCV: 87.4 fL (ref 80.0–100.0)
Platelets: 163 K/uL (ref 150–400)
RBC: 2.85 MIL/uL — ABNORMAL LOW (ref 3.87–5.11)
RDW: 19.1 % — ABNORMAL HIGH (ref 11.5–15.5)
WBC: 23.3 K/uL — ABNORMAL HIGH (ref 4.0–10.5)
nRBC: 0.3 % — ABNORMAL HIGH (ref 0.0–0.2)

## 2024-02-19 LAB — GLUCOSE, CAPILLARY
Glucose-Capillary: 118 mg/dL — ABNORMAL HIGH (ref 70–99)
Glucose-Capillary: 153 mg/dL — ABNORMAL HIGH (ref 70–99)

## 2024-02-19 LAB — PHOSPHORUS: Phosphorus: 4.6 mg/dL (ref 2.5–4.6)

## 2024-02-19 LAB — MAGNESIUM: Magnesium: 3.2 mg/dL — ABNORMAL HIGH (ref 1.7–2.4)

## 2024-02-19 MED ORDER — LORAZEPAM 2 MG/ML IJ SOLN: 1.0000 mg | INTRAMUSCULAR | Status: DC | PRN

## 2024-02-19 MED ORDER — BIOTENE DRY MOUTH MT LIQD: 15.0000 mL | OROMUCOSAL | Status: DC | PRN

## 2024-02-19 MED ORDER — SODIUM CHLORIDE 0.9 % IV SOLN
1.0000 g | INTRAVENOUS | Status: DC
  Filled 2024-02-19: qty 10

## 2024-02-19 MED ORDER — GLYCOPYRROLATE 0.2 MG/ML IJ SOLN: 0.2000 mg | INTRAMUSCULAR | Status: DC | PRN

## 2024-02-19 MED ORDER — POLYVINYL ALCOHOL 1.4 % OP SOLN: 1.0000 [drp] | Freq: Four times a day (QID) | OPHTHALMIC | Status: DC | PRN

## 2024-02-19 MED ORDER — GLYCOPYRROLATE 0.2 MG/ML IJ SOLN
0.2000 mg | INTRAMUSCULAR | Status: DC | PRN
  Administered 2024-02-19: 0.2 mg via INTRAVENOUS
  Filled 2024-02-19: qty 1

## 2024-02-19 MED ORDER — HYDROMORPHONE HCL 1 MG/ML IJ SOLN
0.5000 mg | INTRAMUSCULAR | Status: DC | PRN
  Administered 2024-02-19: 2 mg via INTRAVENOUS
  Administered 2024-02-19 (×2): 1 mg via INTRAVENOUS
  Administered 2024-02-19 (×2): 2 mg via INTRAVENOUS
  Filled 2024-02-19 (×2): qty 2
  Filled 2024-02-19: qty 1
  Filled 2024-02-19: qty 2

## 2024-02-19 NOTE — Progress Notes (Signed)
 Nutrition Brief Note  Chart reviewed. Pt now transitioning to comfort care.  No further nutrition interventions planned at this time.  Please re-consult as needed.   Shelle Iron RD, LDN Contact via [employer].

## 2024-02-19 NOTE — Progress Notes (Signed)
 NAME:  Paula Wheeler, MRN:  [id number], DOB:  06-Feb-1951, LOS: 4 ADMISSION DATE:  02/15/2024 CONSULTATION DATE:  02/16/2024 REFERRING MD:  Cheryle - TRH, CHIEF COMPLAINT:  AMS, hypotension, ?GIB   History of Present Illness:  73 year old woman who presented to [facility] ED 10/19 for AMS. PMHx significant for HTN, HLD, PAF (on Eliquis ), COPD, tobacco dependence, T2DM, IDA, history of GIB. Recent admission 9/27-10/9 for sepsis 2/2 cellulitis, discharged to rehab.   Patient presented to [facility] ED from her rehab facility via EMS for acute mental status change/increased confusion from baseline, per facility staff. On EMS arrival, patient was hypotensive and tachycardic in AF with RVR. Received LR bolus. On ED arrival, patient was afebrile with HR 115, BP 104/60, RR 20, SpO2 96%. Labs were notable for WBC 23.7, Hgb 6.9 (baseline ~9-10), Plt 242. Na 136, K 3.7, CO2 19, glucose 123. AG 22. Cr 2.97 (baseline 2.2-2.5). LFTs WNL. Lipase WNL. Trop 142 > 137, suspect demand-related. BNP 4432. PCT 0.76 > 1.03. LA 0.8. UA unremarkable. FOBT+. CXR with cardiomegaly, vascular congestion, small R pleural effusion/atelectasis. CT Head with generalized atrophy, NAICA. CT Renal Stone Protocol showed patchy RLL consolidation, small bilateral pleural effusions, cardiomegaly with small pericardial effusion, vascular kidney calcifications, moderate stool burden.  On 10/20, patient was noted to have increased WBC count (27.8 from 23.7) with stably low Hgb (7.3 from 6.9), worsening AKI (Cr 3.58 from 2.97) and increased AG to 31 (22). She was noted to be hypotensive with MAP high 50s and HR 110s.  PCCM consulted for evaluation.  Pertinent Medical History:   Past Medical History:  Diagnosis Date   Anemia    COPD (chronic obstructive pulmonary disease) (HCC)    Dyslipidemia    Exogenous obesity    GERD (gastroesophageal reflux disease)    GI bleed 05/14/2016   History of abnormal cells from cervix    Hyperlipidemia     Hypertension    Iron  deficiency anemia    Longstanding persistent atrial fibrillation (HCC)    Microalbuminuria    Tobacco dependence    Type II diabetes mellitus (HCC)    Significant Hospital Events: Including procedures, antibiotic start and stop dates in addition to other pertinent events   10/19 - Presented to [facility] ED from [facility] with AMS, hypotension. Concern for sepsis. 10/20 - Transferred to ICU for AF/RVR, hypotension. Vasopressors initiated. Repeat CT A/P with contrast with moderate R/small L pleural effusions, RLL collapse/consolidation, small pericardial effusion, stool throughout the colon. 10/22 dnr/dni  Interim History / Subjective:  Patient back on NRB overnight More altered today  Objective:  Blood pressure (!) 117/48, pulse 98, temperature (!) 100.4 F (38 C), temperature source Axillary, resp. rate (!) 6, height 5' 6 (1.676 m), weight 81.6 kg, SpO2 91%.        Intake/Output Summary (Last 24 hours) at 02/19/2024 1049 Last data filed at 02/19/2024 1002 Gross per 24 hour  Intake 976.59 ml  Output 30 ml  Net 946.59 ml   Filed Weights   02/17/24 0331 02/18/24 0500 02/19/24 0500  Weight: 78.8 kg 79.6 kg 81.6 kg   Physical Examination: General:  critically ill appearing on mech vent HEENT: MM pink/moist; NRB in place Neuro: not following commands; perrl CV: s1s2, irregularly irregular, no m/r/g PULM:  dim clear BS bilaterally; NRB GI: soft,mildly distended, bsx4 dim  Extremities: warm/dry, no LE edema  Skin: Multiple wounds as documented by nursing with dressings in place.  Resolved Hospital Problem List:  Assessment & Plan:  Undifferentiated shock, presume sepsis versus cardiogenic component d/t AF/RVR, resolving Afib with RVR, improved Mildly elevated troponin Elevated BNP Concern for RV dysfunction HTN HLD Abdominal pain, generalized Severe constipation AKI on CKD stage IV, oliguric COPD Tobacco dependence history Anemia,  multifactorial History of IDA T2DM At risk for malnutrition GOC - PMT spoke with family and decision made to transition to comfort today; comfort care orders placed; working on finding placement for hospice   Critical care time:    Norleen JONETTA Emilio DEVONNA [facility] 02/19/24 10:49 AM  Please see [email or web address] for pager details.  From 7A-7P if no response, please call [phone] After hours, please call ELink [phone]

## 2024-02-19 NOTE — Discharge Summary (Signed)
 Physician Discharge Summary         Patient ID: Paula Wheeler MRN: [id number] DOB/AGE: 07/12/1950 73 y.o.  Admit date: 02/15/2024 Discharge date: 02/19/2024  Discharge Diagnoses:    Active Hospital Problems   Diagnosis Date Noted   GI bleed 05/14/2016   Pain 02/19/2024   High risk medication use 02/19/2024   Anxiety 02/19/2024   Concern about end of life 02/19/2024   ACP (advance care planning) 02/19/2024   Medication management 02/19/2024   DNR (do not resuscitate) 02/18/2024   Palliative care encounter 02/17/2024   Goals of care, counseling/discussion 02/17/2024   Counseling and coordination of care 02/17/2024   Demand ischemia (HCC) 02/16/2024   Prolonged QT interval 02/15/2024   Acute on chronic blood loss anemia 02/15/2024   Controlled type 2 diabetes mellitus without complication (HCC) 02/15/2024   Moderate protein malnutrition 02/15/2024   COPD with acute exacerbation (HCC) 09/27/2023   Carotid artery disease 08/06/2022   Chronic diastolic CHF (congestive heart failure) (HCC) 01/29/2018   GERD (gastroesophageal reflux disease) 01/29/2018   Elevated troponin 01/29/2018   Acute renal failure superimposed on stage 4 chronic kidney disease (HCC) 01/29/2018   Tobacco abuse 01/29/2018   PAD (peripheral artery disease) 12/23/2017   Persistent atrial fibrillation 06/10/2016   Essential hypertension 05/14/2016   HLD (hyperlipidemia) 05/14/2016    Resolved Hospital Problems  No resolved problems to display.      Discharge summary   73 year old woman who presented to [facility] ED 10/19 for AMS. PMHx significant for HTN, HLD, PAF (on Eliquis ), COPD, tobacco dependence, T2DM, IDA, history of GIB. Recent admission 9/27-10/9 for sepsis 2/2 cellulitis, discharged to rehab.    Patient presented to [facility] ED from her rehab facility via EMS for acute mental status change/increased confusion from baseline, per facility staff. On EMS arrival, patient was hypotensive and tachycardic  in AF with RVR. Received LR bolus. On ED arrival, patient was afebrile with HR 115, BP 104/60, RR 20, SpO2 96%. Labs were notable for WBC 23.7, Hgb 6.9 (baseline ~9-10), Plt 242. Na 136, K 3.7, CO2 19, glucose 123. AG 22. Cr 2.97 (baseline 2.2-2.5). LFTs WNL. Lipase WNL. Trop 142 > 137, suspect demand-related. BNP 4432. PCT 0.76 > 1.03. LA 0.8. UA unremarkable. FOBT+. CXR with cardiomegaly, vascular congestion, small R pleural effusion/atelectasis. CT Head with generalized atrophy, NAICA. CT Renal Stone Protocol showed patchy RLL consolidation, small bilateral pleural effusions, cardiomegaly with small pericardial effusion, vascular kidney calcifications, moderate stool burden.   On 10/20, patient was noted to have increased WBC count (27.8 from 23.7) with stably low Hgb (7.3 from 6.9), worsening AKI (Cr 3.58 from 2.97) and increased AG to 31 (22). She was noted to be hypotensive with MAP high 50s and HR 110s.  On 10/23 worsening respiratory failure. Palliative care discussed with family and decided to transition to comfort care. Plan to send to hospice.      Discharge Plan by Active Problems    Undifferentiated shock, presume sepsis versus cardiogenic component d/t AF/RVR, resolving Afib with RVR, improved Mildly elevated troponin Elevated BNP Concern for RV dysfunction HTN HLD Abdominal pain, generalized Severe constipation AKI on CKD stage IV, oliguric COPD Tobacco dependence history Anemia, multifactorial History of IDA T2DM At risk for malnutrition GOC - comfort care - transfer to hospice - pain management and comfort orders per hospice   Significant Hospital tests/ studies   Procedures    Culture data/antimicrobials      Consults      Discharge  Exam: BP (!) 117/48 (BP Location: Right Arm)   Pulse 98   Temp (!) 100.4 F (38 C) (Axillary) Comment: RN notified; top blanket removed  Resp (!) 6   Ht 5' 6 (1.676 m)   Wt 81.6 kg   SpO2 91%   BMI 29.04 kg/m    General:  critically ill appearing female HEENT: MM pink/moist Neuro: not following commands; perrl CV: s1s2, irregularly irregular, no m/r/g PULM:  dim clear BS bilaterally GI: soft,mildly distended, bsx4 dim  Extremities: warm/dry, no LE edema  Skin: Multiple wounds as documented by nursing with dressings in place.  Labs at discharge   Lab Results  Component Value Date   CREATININE 5.46 (H) 02/19/2024   BUN 149 (H) 02/19/2024   NA 136 02/19/2024   K 4.3 02/19/2024   CL 96 (L) 02/19/2024   CO2 21 (L) 02/19/2024   Lab Results  Component Value Date   WBC 23.3 (H) 02/19/2024   HGB 8.0 (L) 02/19/2024   HCT 24.9 (L) 02/19/2024   MCV 87.4 02/19/2024   PLT 163 02/19/2024   Lab Results  Component Value Date   ALT 17 02/17/2024   AST 44 (H) 02/17/2024   ALKPHOS 122 02/17/2024   BILITOT 0.9 02/17/2024   Lab Results  Component Value Date   INR 2.4 (H) 02/16/2024   INR 2.2 (H) 11/23/2019   INR 1.60 01/29/2018    Current radiological studies    DG CHEST PORT 1 VIEW Result Date: 02/18/2024 EXAM: 1 VIEW(S) XRAY OF THE CHEST 02/18/2024 05:22:00 AM COMPARISON: 02/15/2024 CLINICAL HISTORY: 200808 Hypoxia 200808. hypoxia FINDINGS: LUNGS AND PLEURA: Bilateral pleural effusions. Increased interstitial markings. No pneumothorax. HEART AND MEDIASTINUM: Cardiomegaly. BONES AND SOFT TISSUES: No acute osseous abnormality. IMPRESSION: 1. Bilateral veiling pleural effusions. 2. Bilateral increased interstitial markings, consider acute pulmonary edema. 3. Cardiomegaly. Electronically signed by: Helayne Hurst MD 02/18/2024 05:49 AM EDT RP Workstation: [id number]    Disposition:    Discharge disposition: 51-Hospice/Medical Facility       Discharge Instructions     No wound care   Complete by: As directed        Allergies as of 02/19/2024       Reactions   Nsaids Swelling   Fluid retention and extremity swelling (hands, legs)   Penicillins Hives   Accupril  [quinapril  Hcl]  Other (See Comments)   Elevated potassium and creatinine         Medication List     STOP taking these medications    acetaminophen  500 MG tablet Commonly known as: TYLENOL    albuterol  108 (90 Base) MCG/ACT inhaler Commonly known as: VENTOLIN  HFA   allopurinol  100 MG tablet Commonly known as: ZYLOPRIM    amLODipine  5 MG tablet Commonly known as: NORVASC    apixaban  5 MG Tabs tablet Commonly known as: Eliquis    atorvastatin  80 MG tablet Commonly known as: LIPITOR   bisacodyl 10 MG suppository Commonly known as: DULCOLAX   diclofenac  Sodium 1 % Gel Commonly known as: VOLTAREN    eucerin cream   gabapentin 100 MG capsule Commonly known as: NEURONTIN   Gerhardt's butt cream Crea   HYDROcodone -acetaminophen  5-325 MG tablet Commonly known as: NORCO/VICODIN   insulin  NPH-regular Human (70-30) 100 UNIT/ML injection   lactulose 10 GM/15ML solution Commonly known as: CHRONULAC   linezolid 600 MG tablet Commonly known as: ZYVOX   loratadine  10 MG tablet Commonly known as: CLARITIN    metolazone 2.5 MG tablet Commonly known as: ZAROXOLYN   Nutritional Shake Liqd  pantoprazole  40 MG tablet Commonly known as: PROTONIX    polyethylene glycol 17 g packet Commonly known as: MIRALAX  / GLYCOLAX    PROTEIN PO   senna-docusate 8.6-50 MG tablet Commonly known as: Senokot-S   sucralfate 1 GM/10ML suspension Commonly known as: CARAFATE   tiZANidine 4 MG tablet Commonly known as: ZANAFLEX   torsemide  20 MG tablet Commonly known as: DEMADEX    Trelegy Ellipta  100-62.5-25 MCG/ACT Aepb Generic drug: Fluticasone -Umeclidin-Vilant   UNABLE TO FIND   zinc oxide 20 % ointment       TAKE these medications    glycopyrrolate  0.2 MG/ML injection Commonly known as: ROBINUL  Inject 1 mL (0.2 mg total) into the vein every 4 (four) hours as needed (excessive secretions).         Follow-up appointment   hospice Discharge Condition:    Unstable; comfort  care  Physician Statement:   The Patient was personally examined, the discharge assessment and plan has been personally reviewed and I agree with PA-C JD Otelia Hettinger's assessment and plan. 35 minutes of time have been dedicated to discharge assessment, planning and discharge instructions.   Signed: Norleen JONETTA Cedar 02/19/2024, 2:32 PM

## 2024-02-19 NOTE — Plan of Care (Signed)
  Problem: Pain Managment: Goal: General experience of comfort will improve and/or be controlled Outcome: Progressing   Problem: Activity: Goal: Ability to tolerate increased activity will improve Outcome: Not Progressing   Problem: Respiratory: Goal: Levels of oxygenation will improve Outcome: Not Progressing   Problem: Clinical Measurements: Goal: Respiratory complications will improve Outcome: Not Progressing Goal: Cardiovascular complication will be avoided Outcome: Not Progressing   Problem: Nutrition: Goal: Adequate nutrition will be maintained Outcome: Not Progressing   Problem: Elimination: Goal: Will not experience complications related to urinary retention Outcome: Not Progressing

## 2024-02-19 NOTE — TOC Transition Note (Signed)
 Transition of Care [facility]) - Discharge Note   Patient Details  Name: Paula Wheeler MRN: [id number] Date of Birth: [birth year]  Transition of Care [facility]) CM/SW Contact:  Jon ONEIDA Anon, RN Phone Number: 02/19/2024, 3:30 PM   Clinical Narrative:    Pt to discharge to [facility]. Pt brother is in agreement with DC plan. DC packet with signed DNR inside placed at RN station. PTAR called at [phone]. RN given number to call report. There are no further ICM needs at this time.   Final next level of care: Hospice Medical Facility Barriers to Discharge: Barriers Resolved   Patient Goals and CMS Choice Patient states their goals for this hospitalization and ongoing recovery are:: Pt brother wishes pt go to Inpatient hospice facility CMS Medicare.gov Compare Post Acute Care list provided to:: Patient Represenative (must comment) (Pt brother) Choice offered to / list presented to :  (Pt brother) [place] ownership interest in [facility].provided to::  (Pt brother)    Discharge Placement              Patient chooses bed at:  [facility]) Patient to be transferred to facility by: PTAR Name of family member notified: [name], [name] (Brother)  [phone] Patient and family notified of of transfer: 02/19/24  Discharge Plan and Services Additional resources added to the After Visit Summary for   In-house Referral: Hospice / Palliative Care Discharge Planning Services: CM Consult Post Acute Care Choice: Residential Hospice Bed          DME Arranged: N/A DME Agency: NA       HH Arranged: NA HH Agency: Other - See comment ([facility]) Date HH Agency Contacted: 02/19/24   Representative spoke with at [facility] Agency: Magdalena Berber  Social Drivers of Health (SDOH) Interventions SDOH Screenings   Food Insecurity: Patient Unable To Answer (02/15/2024)  Housing: Unknown (02/15/2024)  Transportation Needs: Patient Unable To Answer (02/15/2024)   Utilities: Patient Unable To Answer (02/15/2024)  Social Connections: Patient Unable To Answer (02/15/2024)  Recent Concern: Social Connections - Socially Isolated (01/24/2024)  Tobacco Use: High Risk (02/15/2024)     Readmission Risk Interventions    02/18/2024    4:19 PM 01/26/2024    8:31 AM  Readmission Risk Prevention Plan  Transportation Screening Complete Complete  HRI or Home Care Consult  Complete  Social Work Consult for Recovery Care Planning/Counseling  Complete  Palliative Care Screening  Not Applicable  Medication Review Oceanographer) Complete Complete  PCP or Specialist appointment within 3-5 days of discharge Complete   HRI or Home Care Consult Complete   SW Recovery Care/Counseling Consult Complete   Palliative Care Screening Complete   Skilled Nursing Facility Complete

## 2024-02-19 NOTE — TOC Progression Note (Addendum)
 Transition of Care [facility]) - Progression Note    Patient Details  Name: Paula Wheeler MRN: [id number] Date of Birth: [birth year]  [facility]) CM/SW Contact  Jon ONEIDA Anon, RN Phone Number: 02/19/2024, 11:26 AM  Clinical Narrative:    ICM consulted for inpatient hospice referral. Referral sent to [facility] to hospital rep Magdalena Berber, RN. Cheri to start process with pt transitioning to Inpatient hospice at facility in [place]. ICM will continue to follow to assist with any DC needs.   Expected Discharge Plan: Hospice Medical Facility Barriers to Discharge:  (Awaiting to go to inpatient hospice facility)               Expected Discharge Plan and Services In-house Referral: Hospice / Palliative Care Discharge Planning Services: CM Consult Post Acute Care Choice: Residential Hospice Bed Living arrangements for the past 2 months: Single Family Home                 DME Arranged: N/A DME Agency: NA       HH Arranged: NA HH Agency: Other - See comment ([facility]) Date HH Agency Contacted: 02/19/24   Representative spoke with at [facility] Agency: Magdalena Berber   Social Drivers of Health (SDOH) Interventions SDOH Screenings   Food Insecurity: Patient Unable To Answer (02/15/2024)  Housing: Unknown (02/15/2024)  Transportation Needs: Patient Unable To Answer (02/15/2024)  Utilities: Patient Unable To Answer (02/15/2024)  Social Connections: Patient Unable To Answer (02/15/2024)  Recent Concern: Social Connections - Socially Isolated (01/24/2024)  Tobacco Use: High Risk (02/15/2024)    Readmission Risk Interventions    02/18/2024    4:19 PM 01/26/2024    8:31 AM  Readmission Risk Prevention Plan  Transportation Screening Complete Complete  HRI or Home Care Consult  Complete  Social Work Consult for Recovery Care Planning/Counseling  Complete  Palliative Care Screening  Not Applicable  Medication Review Oceanographer)  Complete Complete  PCP or Specialist appointment within 3-5 days of discharge Complete   HRI or Home Care Consult Complete   SW Recovery Care/Counseling Consult Complete   Palliative Care Screening Complete   Skilled Nursing Facility Complete

## 2024-02-19 NOTE — Progress Notes (Signed)
 Report given to Rexene at hospice

## 2024-02-19 NOTE — Progress Notes (Signed)
 Daily Progress Note   Patient Name: Paula Wheeler       Date: 02/19/2024 DOB: 1951/02/03  Age: 73 y.o. MRN#: [id number] Attending Physician: Annella Donnice SAUNDERS, MD Primary Care Physician: Okey Carlin Redbird, MD Admit Date: 02/15/2024 Length of Stay: 4 days  Reason for Consultation/Follow-up: Establishing goals of care  Subjective:   CC: Patient lying in bed moaning at times.  Spoke with patient's brother at bedside.  Following up regarding complex medical decision making.  Subjective:  Reviewed EMR including recent documentation from PCCM.  PCCM provider had discussed with patient's brothers yesterday evening that patient is not a good candidate for dialysis; brothers/HCPOA agreeing with this.  Had continued appropriate medical interventions at that time. Upon reviewing EMR today, patient now having fever on vitals.  Patient's BUN increased to 149, creatinine increased to 5.46, and so estimated GFR 8.  In past 24 hours patient has required IV Dilaudid  1 mg x 2 doses for pain management. Discussed care with PCCM provider to coordinate care.  Despite appropriate medical interventions, patient has continued to deteriorate.  ------------------------------------------------------------------------------------------------------------- Advance Care Planning Conversation  Pertinent diagnosis: A-fib with RVR, AKI on CKD stage IV, oliguric, COPD, undifferentiated shock  The patient's brother/HCPOA consented to a voluntary Advance Care Planning Conversation in person. Individuals present for the conversation: Patient unable to participate in complex medical decision making.  Discussed care with patient's brother/HCPOA, Paula Wheeler.  Summary of the conversation:  Presented to bedside to see patient.  Patient unable to participate in complex medical decision making due to underlying medical process.  Patient noted to be moaning at times with signs of pain.  Discussed care with patient's  brother/HCPOA, Paula Wheeler, at bedside.  Discussed worsening renal status and overall worsening of medical status despite appropriate medical interventions.  Have already talked with PCCM providers regarding this.  Discussed pathways for medical care moving forward.  Noted appropriateness to transition to comfort focused care at this time knowing patient has end-stage renal disease.  Spent time answering questions as able.  Paula Wheeler agreeing with transition to full comfort focused care at this time.  Will discontinue interventions that are not focused on patient's comfort.  Providing medications for symptom management at end-of-life.  Paula Wheeler initially requested referral for inpatient hospice evaluation in [place] though later on did not want a referral to [facility].  Have involved TOC for assistance with coordination of care.  All questions answered at that time.  Noted palliative medicine team to continue to follow along with patient's medical journey.  Outcome of the conversations and/or documents completed:  Transition to full comfort focused care at this time.  Involved TOC for inpatient hospice referral.  I spent 25 minutes providing separately identifiable ACP services with the patient and/or surrogate decision maker in a voluntary, in-person conversation discussing the patient's wishes and goals as detailed in the above note.  Tinnie Radar, DO Palliative Medicine Provider  -------------------------------------------------------------------------------------------------------------   Discussed care with PCCM providers, TOC, bedside RN, and hospice of the [facility] liaison to coordinate transition to full comfort focused care at this time.  Objective:   Vital Signs:  BP (!) 115/37   Pulse 90   Temp 98.9 F (37.2 C) (Axillary)   Resp 15   Ht 5' 6 (1.676 m)   Wt 81.6 kg   SpO2 99%   BMI 29.04 kg/m   Physical Exam: General: Moaning at times in pain, frail, chronically  ill-appearing Cardiovascular: Tachycardia noted Respiratory: Slightly increased work of breathing noted, HFNC 8 L/min  Skin: Wounds present on lower extremities bilaterally, pale  Assessment & Plan:   Assessment: Patient is 73 year old female with past medical history of atrial fibrillation on Eliquis , tobacco dependence, reported COPD but with no fixed obstruction on spirometry, CKD stage IV, type 2 diabetes mellitus, history of GI bleed, and multiple lower extremity wounds who was admitted on 02/15/2024 from rehab facility for management of acute mental status change.  Patient had recently been hospitalized from 9/27-10/9 for management of sepsis secondary to cellulitis and had been discharged to rehab after that hospitalization.  Since admission, patient has received management for undifferentiated shock presumed to be secondary to sepsis versus cardiogenic, A-fib with RVR severe constipation and AKI on CKD.  Cardiology and GI consulted for recommendations.  Palliative medicine team consulted to assist with complex medical decision making.   Recommendations/Plan: # Complex medical decision making/goals of care:     - Patient unable to participate in complex medical decision making due to underlying medical status.                  - Discussed care with patient's brother, Paula Wheeler, present at bedside.  Reviewed updates regarding patient's medical illnesses.  Patient' medical status has continued to worsen despite appropriate medical interventions.  Patient not a candidate for dialysis.  Based on patient's worsening medical status, discussed with Paula Wheeler/HCPOA and transitioning to full comfort focused care at this time.  Inpatient hospice referral; TOC consulted to assist.  Palliative medicine team continuing to follow along with patient's medical journey.  -At this time we will discontinue interventions that are no longer focused on comfort such as IV fluids, imaging, or lab work.  Will instead focus on  symptom management of pain, dyspnea, and agitation in the setting of end-of-life care.                -   Code Status: Do not attempt resuscitation (DNR) - Comfort care   # Symptom management Patient is receiving these palliative interventions for symptom management with an intent to improve quality of life.     -Pain/Dyspnea, acute in the setting of end-of-life care                               - Change IV Dilaudid  to 0.5-2mg  IV every 30 minutes as needed.  Continue to adjust based on patient's symptom burden.  If patient needing frequent dosing, may need to consider continuous infusion.                  -Anxiety/agitation, in the setting of end-of-life care                               -Start IV Ativan 1 mg every 4 hours as needed. Continue to adjust based on patient's symptom burden.                    -Secretions, in the setting of end-of-life care                               -Start IV glycopyrrolate  0.2 mg every 4 hours as needed.  # Psycho-social/Spiritual Support:  - Support System: 5 brothers; Paula Wheeler and Paula Wheeler is primary alternate   # Discharge Planning: Inpatient hospice referral  Discussed with: PCCM provider, RN, patient's HCPOA/brother  Paula Wheeler SINNING, hospice of the [facility]  Thank you for allowing the palliative care team to participate in the care Almarie DELENA Ada.  Tinnie Radar, DO Palliative Care Provider PMT # [phone]  If patient remains symptomatic despite maximum doses, please call PMT at [phone] between 0700 and 1900. Outside of these hours, please call attending, as PMT does not have night coverage.  Billing based on MDM: High  Problems Addressed: One or more chronic illnesses with severe exacerbation, progression, or side effects of treatment.  Risks: Parenteral controlled substances

## 2024-02-19 NOTE — Progress Notes (Signed)
 PHARMACY NOTE:  ANTIMICROBIAL RENAL DOSAGE ADJUSTMENT  Current antimicrobial regimen includes a mismatch between antimicrobial dosage and estimated renal function.  As per policy approved by the Pharmacy & Therapeutics and Medical Executive Committees, the antimicrobial dosage will be adjusted accordingly.  Current antimicrobial dosage:  cefepime  2g q24  Indication: sepsis  Renal Function:  Estimated Creatinine Clearance: 9.9 mL/min (A) (by C-G formula based on SCr of 5.46 mg/dL (H)). []      On intermittent HD, scheduled: []      On CRRT    Antimicrobial dosage has been changed to:  cefepime  1g q24  Additional comments:   Thank you for allowing pharmacy to be a part of this patient's care.  Britta Eva Na, [facility] 02/19/2024 7:27 AM

## 2024-02-19 NOTE — Progress Notes (Signed)
   02/19/24 1112  Spiritual Encounters  Type of Visit Initial  Care provided to: Pt and family  Reason for visit Urgent spiritual support  OnCall Visit No   Visited with patient transferred to comfort care. Brother Donald at bedside. Provided prayer and spiritual care to brother as patient not alert. Served as a Nurse, mental health presence.

## 2024-02-21 LAB — CULTURE, BLOOD (ROUTINE X 2)
Culture: NO GROWTH
Culture: NO GROWTH
Special Requests: ADEQUATE

## 2024-02-28 DEATH — deceased
